# Patient Record
Sex: Female | Born: 1945 | Race: White | Hispanic: No | Marital: Married | Smoking: Never smoker
Health system: Southern US, Community
[De-identification: ages and names within clinical notes are randomized; demographics above are authoritative.]

## PROBLEM LIST (undated history)

## (undated) DIAGNOSIS — E559 Vitamin D deficiency, unspecified: Secondary | ICD-10-CM

## (undated) DIAGNOSIS — K219 Gastro-esophageal reflux disease without esophagitis: Secondary | ICD-10-CM

## (undated) DIAGNOSIS — Z79890 Hormone replacement therapy (postmenopausal): Secondary | ICD-10-CM

## (undated) DIAGNOSIS — K5900 Constipation, unspecified: Secondary | ICD-10-CM

## (undated) DIAGNOSIS — D696 Thrombocytopenia, unspecified: Secondary | ICD-10-CM

## (undated) DIAGNOSIS — D219 Benign neoplasm of connective and other soft tissue, unspecified: Secondary | ICD-10-CM

## (undated) DIAGNOSIS — J45909 Unspecified asthma, uncomplicated: Secondary | ICD-10-CM

## (undated) DIAGNOSIS — M3500 Sicca syndrome, unspecified: Secondary | ICD-10-CM

## (undated) DIAGNOSIS — J849 Interstitial pulmonary disease, unspecified: Secondary | ICD-10-CM

## (undated) DIAGNOSIS — G4730 Sleep apnea, unspecified: Secondary | ICD-10-CM

## (undated) DIAGNOSIS — S32009A Unspecified fracture of unspecified lumbar vertebra, initial encounter for closed fracture: Secondary | ICD-10-CM

## (undated) DIAGNOSIS — G4700 Insomnia, unspecified: Secondary | ICD-10-CM

## (undated) DIAGNOSIS — N951 Menopausal and female climacteric states: Secondary | ICD-10-CM

## (undated) DIAGNOSIS — M069 Rheumatoid arthritis, unspecified: Secondary | ICD-10-CM

## (undated) DIAGNOSIS — L719 Rosacea, unspecified: Secondary | ICD-10-CM

## (undated) DIAGNOSIS — L409 Psoriasis, unspecified: Secondary | ICD-10-CM

## (undated) HISTORY — DX: Sleep apnea, unspecified: G47.30

## (undated) HISTORY — DX: Menopausal and female climacteric states: N95.1

## (undated) HISTORY — DX: Constipation, unspecified: K59.00

## (undated) HISTORY — DX: Psoriasis, unspecified: L40.9

## (undated) HISTORY — DX: Rheumatoid arthritis, unspecified: M06.9

## (undated) HISTORY — DX: Unspecified asthma, uncomplicated: J45.909

## (undated) HISTORY — DX: Unspecified fracture of unspecified lumbar vertebra, initial encounter for closed fracture: S32.009A

## (undated) HISTORY — DX: Hormone replacement therapy: Z79.890

## (undated) HISTORY — DX: Vitamin D deficiency, unspecified: E55.9

## (undated) HISTORY — DX: Gastro-esophageal reflux disease without esophagitis: K21.9

## (undated) HISTORY — DX: Benign neoplasm of connective and other soft tissue, unspecified: D21.9

## (undated) HISTORY — DX: Insomnia, unspecified: G47.00

## (undated) HISTORY — DX: Sjogren syndrome, unspecified: M35.00

## (undated) HISTORY — DX: Rosacea, unspecified: L71.9

## (undated) HISTORY — DX: Thrombocytopenia, unspecified: D69.6

## (undated) HISTORY — DX: Interstitial pulmonary disease, unspecified: J84.9

---

## 1989-01-06 HISTORY — PX: BREAST SURGERY: SHX581

## 1991-07-07 HISTORY — PX: NASAL SINUS SURGERY: SHX719

## 1993-01-06 HISTORY — PX: ABDOMINAL HYSTERECTOMY: SHX81

## 1998-11-12 ENCOUNTER — Encounter: Admission: RE | Admit: 1998-11-12 | Discharge: 1998-11-12 | Payer: Self-pay | Admitting: Obstetrics and Gynecology

## 1998-11-12 ENCOUNTER — Encounter: Payer: Self-pay | Admitting: Obstetrics and Gynecology

## 1999-03-12 ENCOUNTER — Other Ambulatory Visit: Admission: RE | Admit: 1999-03-12 | Discharge: 1999-03-12 | Payer: Self-pay | Admitting: Obstetrics and Gynecology

## 1999-03-18 ENCOUNTER — Encounter: Payer: Self-pay | Admitting: Obstetrics and Gynecology

## 1999-03-18 ENCOUNTER — Encounter: Admission: RE | Admit: 1999-03-18 | Discharge: 1999-03-18 | Payer: Self-pay | Admitting: Obstetrics and Gynecology

## 1999-07-02 ENCOUNTER — Ambulatory Visit (HOSPITAL_COMMUNITY): Admission: RE | Admit: 1999-07-02 | Discharge: 1999-07-02 | Payer: Self-pay | Admitting: Gastroenterology

## 2001-05-21 ENCOUNTER — Encounter: Admission: RE | Admit: 2001-05-21 | Discharge: 2001-05-21 | Payer: Self-pay | Admitting: Obstetrics and Gynecology

## 2001-05-21 ENCOUNTER — Encounter: Payer: Self-pay | Admitting: Obstetrics and Gynecology

## 2002-06-15 ENCOUNTER — Ambulatory Visit (HOSPITAL_COMMUNITY): Admission: RE | Admit: 2002-06-15 | Discharge: 2002-06-15 | Payer: Self-pay | Admitting: Gastroenterology

## 2002-08-07 LAB — HM COLONOSCOPY: HM Colonoscopy: NORMAL

## 2003-04-11 ENCOUNTER — Ambulatory Visit (HOSPITAL_COMMUNITY): Admission: RE | Admit: 2003-04-11 | Discharge: 2003-04-11 | Payer: Self-pay | Admitting: Otolaryngology

## 2003-04-20 ENCOUNTER — Other Ambulatory Visit: Admission: RE | Admit: 2003-04-20 | Discharge: 2003-04-20 | Payer: Self-pay | Admitting: Otolaryngology

## 2004-06-07 ENCOUNTER — Encounter: Admission: RE | Admit: 2004-06-07 | Discharge: 2004-06-07 | Payer: Self-pay | Admitting: Orthopedic Surgery

## 2004-06-24 ENCOUNTER — Encounter: Admission: RE | Admit: 2004-06-24 | Discharge: 2004-06-24 | Payer: Self-pay | Admitting: Orthopedic Surgery

## 2004-08-21 ENCOUNTER — Encounter: Admission: RE | Admit: 2004-08-21 | Discharge: 2004-08-21 | Payer: Self-pay | Admitting: Rheumatology

## 2004-08-29 ENCOUNTER — Encounter: Admission: RE | Admit: 2004-08-29 | Discharge: 2004-08-29 | Payer: Self-pay | Admitting: Rheumatology

## 2004-11-06 DIAGNOSIS — M069 Rheumatoid arthritis, unspecified: Secondary | ICD-10-CM

## 2004-11-06 HISTORY — DX: Rheumatoid arthritis, unspecified: M06.9

## 2005-05-01 ENCOUNTER — Encounter: Payer: Self-pay | Admitting: Family Medicine

## 2006-01-06 DIAGNOSIS — G4730 Sleep apnea, unspecified: Secondary | ICD-10-CM

## 2006-01-06 HISTORY — DX: Sleep apnea, unspecified: G47.30

## 2007-07-13 ENCOUNTER — Encounter: Payer: Self-pay | Admitting: Family Medicine

## 2007-10-07 HISTORY — PX: CYSTOCELE REPAIR: SHX163

## 2007-10-19 ENCOUNTER — Ambulatory Visit (HOSPITAL_BASED_OUTPATIENT_CLINIC_OR_DEPARTMENT_OTHER): Admission: RE | Admit: 2007-10-19 | Discharge: 2007-10-20 | Payer: Self-pay | Admitting: Urology

## 2008-01-11 ENCOUNTER — Encounter: Payer: Self-pay | Admitting: Family Medicine

## 2008-08-06 LAB — HM MAMMOGRAPHY: HM Mammogram: NORMAL

## 2008-08-28 ENCOUNTER — Encounter: Payer: Self-pay | Admitting: Family Medicine

## 2009-01-15 ENCOUNTER — Encounter: Payer: Self-pay | Admitting: Family Medicine

## 2009-02-09 ENCOUNTER — Encounter: Payer: Self-pay | Admitting: Family Medicine

## 2009-03-19 ENCOUNTER — Encounter: Payer: Self-pay | Admitting: Family Medicine

## 2009-05-08 ENCOUNTER — Ambulatory Visit: Payer: Self-pay | Admitting: Family Medicine

## 2009-05-08 DIAGNOSIS — E785 Hyperlipidemia, unspecified: Secondary | ICD-10-CM | POA: Insufficient documentation

## 2009-05-08 DIAGNOSIS — B070 Plantar wart: Secondary | ICD-10-CM | POA: Insufficient documentation

## 2009-05-08 DIAGNOSIS — R011 Cardiac murmur, unspecified: Secondary | ICD-10-CM | POA: Insufficient documentation

## 2009-05-08 DIAGNOSIS — J45909 Unspecified asthma, uncomplicated: Secondary | ICD-10-CM | POA: Insufficient documentation

## 2009-05-08 DIAGNOSIS — N959 Unspecified menopausal and perimenopausal disorder: Secondary | ICD-10-CM | POA: Insufficient documentation

## 2009-05-08 DIAGNOSIS — M810 Age-related osteoporosis without current pathological fracture: Secondary | ICD-10-CM | POA: Insufficient documentation

## 2009-05-08 DIAGNOSIS — M069 Rheumatoid arthritis, unspecified: Secondary | ICD-10-CM | POA: Insufficient documentation

## 2009-05-08 DIAGNOSIS — K219 Gastro-esophageal reflux disease without esophagitis: Secondary | ICD-10-CM | POA: Insufficient documentation

## 2009-05-08 DIAGNOSIS — G4730 Sleep apnea, unspecified: Secondary | ICD-10-CM | POA: Insufficient documentation

## 2009-05-08 DIAGNOSIS — Q762 Congenital spondylolisthesis: Secondary | ICD-10-CM | POA: Insufficient documentation

## 2009-05-08 DIAGNOSIS — H04129 Dry eye syndrome of unspecified lacrimal gland: Secondary | ICD-10-CM | POA: Insufficient documentation

## 2009-05-08 DIAGNOSIS — R4589 Other symptoms and signs involving emotional state: Secondary | ICD-10-CM | POA: Insufficient documentation

## 2009-06-21 ENCOUNTER — Encounter: Payer: Self-pay | Admitting: Family Medicine

## 2009-10-05 ENCOUNTER — Telehealth: Payer: Self-pay | Admitting: Family Medicine

## 2009-10-25 ENCOUNTER — Encounter: Payer: Self-pay | Admitting: Family Medicine

## 2010-01-16 ENCOUNTER — Encounter: Payer: Self-pay | Admitting: Family Medicine

## 2010-02-07 NOTE — Procedures (Signed)
[facility]: [facility]   Imported By: Lanelle Bal 05/14/2009 10:25:27  _____________________________________________________________________  External Attachment:    Type:   Image     Comment:   External Document

## 2010-02-07 NOTE — Letter (Signed)
Summary: [facility] at [facility] at [facility]   Imported By: Maryln Gottron 05/10/2009 11:15:28  _____________________________________________________________________  External Attachment:    Type:   Image     Comment:   External Document

## 2010-02-07 NOTE — Progress Notes (Signed)
Summary: refill request for flonase  Phone Note Refill Request Message from:  Fax from Pharmacy  Refills Requested: Medication #1:  fluticasone 50 mcg Faxed request from [facility], this is not on med list.  Initial call taken by: Lowella Petties CMA,  October 05, 2009 12:18 PM  Follow-up for Phone Call        px written on EMR for call in  verify with pt as I did not see it on her med list --make sure it is the nasal spray- thanks Follow-up by: Judith Part MD,  October 05, 2009 1:30 PM  Additional Follow-up for Phone Call Additional follow up Details #1::        Rx faxed to pharmacy, called pt to verify that medication was correct, pt wasn't home. Called pharmacy and it was correct, per Dr.Tower ok to send to pharmacy. Additional Follow-up by: Mervin Hack CMA Duncan Dull),  October 05, 2009 1:38 PM    New/Updated Medications: FLONASE 50 MCG/ACT SUSP (FLUTICASONE PROPIONATE) 2 sprays in each nostril once daily Prescriptions: FLONASE 50 MCG/ACT SUSP (FLUTICASONE PROPIONATE) 2 sprays in each nostril once daily  #1 x 11   Entered by:   Mervin Hack CMA (AAMA)   Authorized by:   Judith Part MD   Signed by:   Mervin Hack CMA Duncan Dull) on 10/05/2009   Method used:   Electronically to        [facility]* (retail)       [address] [place], [place]  [address]       Ph: [phone] or [phone]       Fax: [phone]   RxID:   [id number]

## 2010-02-07 NOTE — Miscellaneous (Signed)
Summary: flu vaccine  Clinical Lists Changes  Observations: Added new observation of FLU VAX: Historical at [facility] (10/10/2009 16:15)      Immunization History:  Influenza Immunization History:    Influenza:  historical at [facility] (10/10/2009)

## 2010-02-07 NOTE — Letter (Signed)
Summary: [facility] Norris [facility] at [facility] Internal Medicine at [facility]   Imported By: Maryln Gottron 05/10/2009 11:18:36  _____________________________________________________________________  External Attachment:    Type:   Image     Comment:   External Document
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
## 2010-02-07 NOTE — Letter (Signed)
Summary: [facility] at [facility] Internal Medicine at [facility]   Imported By: Maryln Gottron 05/10/2009 11:16:57  _____________________________________________________________________  External Attachment:    Type:   Image     Comment:   External Document

## 2010-02-07 NOTE — Letter (Signed)
Summary: [facility] at [facility] Internal Medicine at [facility]   Imported By: Maryln Gottron 05/10/2009 11:20:03  _____________________________________________________________________  External Attachment:    Type:   Image     Comment:   External Document

## 2010-02-07 NOTE — Letter (Signed)
Summary: Patient Questionnaire  Patient Questionnaire   Imported By: Beau Fanny 05/09/2009 15:36:51  _____________________________________________________________________  External Attachment:    Type:   Image     Comment:   External Document

## 2010-02-21 NOTE — Letter (Signed)
Summary: [facility] note  [facility] note   Imported By: Kassie Mends 02/11/2010 10:05:33  _____________________________________________________________________  External Attachment:    Type:   Image     Comment:   External Document

## 2010-03-26 ENCOUNTER — Telehealth: Payer: Self-pay | Admitting: *Deleted

## 2010-03-26 NOTE — Telephone Encounter (Signed)
Pt states she is due for pneumovax, rheumatologist has suggested that she get these every few years.  OK to schedule?

## 2010-03-26 NOTE — Telephone Encounter (Signed)
These are generally given every 5-7 years below the age of 18 -- and I do not see one documented so please sched for the immunization (does not need to see me)

## 2010-03-26 NOTE — Telephone Encounter (Signed)
Pt notified as instructed by telephone. Pt scheduled nurse visit on 04/16/10 at 2:30pm.

## 2010-04-16 ENCOUNTER — Ambulatory Visit: Payer: Self-pay

## 2010-04-18 ENCOUNTER — Ambulatory Visit: Payer: Self-pay

## 2010-04-18 ENCOUNTER — Ambulatory Visit

## 2010-05-03 ENCOUNTER — Telehealth: Payer: Self-pay | Admitting: *Deleted

## 2010-05-03 NOTE — Telephone Encounter (Signed)
I wrote it on px pad in IN box

## 2010-05-03 NOTE — Telephone Encounter (Signed)
[facility] so that she can get her CPAP supplies . She said that her old one is expired and she has replace the tubing, chamber,  etc.

## 2010-05-03 NOTE — Telephone Encounter (Signed)
Patient notified as instructed by telephone. Order faxed to home health [phone] as instructed.

## 2010-05-21 NOTE — Op Note (Signed)
NAME:  Deanna Norris, Deanna Norris            ACCOUNT NO.:  [id number]   MEDICAL RECORD NO.:  [id number]          PATIENT TYPE:  AMB   LOCATION:  [facility]                         FACILITY:  [facility]   PHYSICIAN:  Jamison Neighbor, M.D.  DATE OF BIRTH:  1945-11-14   DATE OF PROCEDURE:  10/19/2007  DATE OF DISCHARGE:                               OPERATIVE REPORT   PREOPERATIVE DIAGNOSES:  1. Stress urinary incontinence.  2. Cystocele.  3. Anterior vault prolapse.   POSTOPERATIVE DIAGNOSES:  1. Stress urinary incontinence.  2. Cystocele.  3. Anterior vault prolapse.   PROCEDURE:  1. Cystourethroscopy.  2. Anterior colporrhaphy with Pinnacle mesh.   ATTENDING PHYSICIAN:  Jamison Neighbor, M.D.   RESIDENT PHYSICIAN:  Dr. Delman Kitten.   ANESTHESIA:  Was general plus local.   INDICATIONS FOR PROCEDURE:  Deanna Norris is a 65 year old white female  with a history of pelvic organ prolapse.  She has had a rectocele repair  in the past and possibly an anterior repair done at that time as well.  She has done quite well until recently she felt a protrusion of her  vaginal wall in the introitus.  She was seen by gyn who diagnosed her  with a cystocele.  She subsequently has followed up with Dr. Logan Bores  complaining of this sensation but also complaining of some urinary  incontinence type symptoms.  She did undergo urodynamic studies  preoperatively which demonstrated a Valsalva leak point pressure of 131  with minimal leak.  She has a good flow with a postvoid residual of 10  mL.  Based on these findings she was recommended to have the above-  stated procedure.  Informed consent was obtained preoperatively after  risks, benefits, consequences and concerns were discussed.   PROCEDURE IN DETAIL:  The patient was brought to the operating room,  placed in a supine position.  She was correctly identified by her  wristband and appropriate time-out was taken.  IV antibiotics were  administered and general  anesthesia was delivered.  Once adequately  anesthetized she was placed in the dorsal lithotomy position.  Great  care taken to minimize the risk of peripheral neuropathy or compartment  syndrome.  Her perineum was prepped and draped sterilely.  We began our  procedure initially by placing a 16 French Foley catheter into her  bladder, inflating the balloon and draining her bladder appropriately.  We then infiltrated the anterior vaginal wall/mucosa with 0.25% Marcaine  with epinephrine.  We then made a vertical incision in the anterior  vaginal wall.  We sharply dissected out the cystocele and carried our  dissection through the endopelvic fascia and exposed the sacrospinous  ligament.  We did this bilaterally.  There was no significant bleeding.  We did check and we did not buttonhole the vaginal mucosa.  With the  ligament and the arcus tendinous palpable on both sides we then used our  Capio device to pass the arms of our Pinnacle mesh.  After the mesh was  seated appropriately with the blue line in the patient's midline we then  trimmed it appropriately.  We then tacked  the anterior aspect of the  mesh down with three simple interrupted sutures using 2-0 Vicryl.  Under  close inspection this did reduce her cystocele significantly and support  her anterior vaginal wall.  We copiously irrigated her wound.  We  removed the urethral catheter and performed rigid cystourethroscopy with  a 22 French rigid cystoscopic sheath, 12 degrees lens and sterile water  as our irrigant.  The urethra was normal in course and caliber and  showed good coaptation at the level of the sphincter.  The bladder did  not show any evidence of violation.  Both ureteral orifices were noted  to be in their normal anatomic position and refluxing clear urine.  There were no masses in her bladder.  We subsequently removed the  cystoscope and replaced the Foley catheter.  We then once again  copiously irrigated her  introitus.  We then closed her vaginal mucosa  with a running suture using 2-0 Vicryl.  This was hemostatic.  We then  placed a vaginal pack with Estrace cream in her vagina and this marked  the end of our procedure.  She awoke from anesthesia and was taken to  the recovery room in stable condition.  There were no complications.  She tolerated the procedure well.  Dr. Logan Bores was present and  participated in all aspects of the case.     ______________________________  Dr Gery Pray, M.D.  Electronically Signed    DW/MEDQ  D:  10/19/2007  T:  10/19/2007  Job:  [id number]

## 2010-05-24 ENCOUNTER — Ambulatory Visit (INDEPENDENT_AMBULATORY_CARE_PROVIDER_SITE_OTHER): Admitting: Family Medicine

## 2010-05-24 ENCOUNTER — Ambulatory Visit: Admitting: Family Medicine

## 2010-05-24 ENCOUNTER — Encounter: Payer: Self-pay | Admitting: Family Medicine

## 2010-05-24 VITALS — BP 104/60 | HR 68 | Temp 98.0°F | Ht 66.0 in | Wt 152.5 lb

## 2010-05-24 DIAGNOSIS — J029 Acute pharyngitis, unspecified: Secondary | ICD-10-CM

## 2010-05-24 DIAGNOSIS — J020 Streptococcal pharyngitis: Secondary | ICD-10-CM

## 2010-05-24 LAB — POCT RAPID STREP A (OFFICE): Rapid Strep A Screen: POSITIVE — AB

## 2010-05-24 MED ORDER — AMOXICILLIN 500 MG PO CAPS: 500.0000 mg | ORAL_CAPSULE | Freq: Three times a day (TID) | ORAL | Status: AC

## 2010-05-24 NOTE — Assessment & Plan Note (Signed)
tx with amox 500 tid  Hold methotrexate on this  Fluids/ sympt care disc Call if not imp in 2-3 d or if worse

## 2010-05-24 NOTE — Op Note (Signed)
   NAME:  Deanna Norris, Deanna Norris                      ACCOUNT NO.:  [id number]   MEDICAL RECORD NO.:  [id number]                   PATIENT TYPE:  AMB   LOCATION:  ENDO                                 FACILITY:  [facility]   PHYSICIAN:  Anselmo Rod, M.D.               DATE OF BIRTH:  [birth year]   DATE OF PROCEDURE:  06/15/2002  DATE OF DISCHARGE:                                 OPERATIVE REPORT   PROCEDURE:  Colonoscopy.   ENDOSCOPIST:  Anselmo Rod, M.D.   INSTRUMENT USED:  Olympus video colonoscope.   INDICATIONS FOR PROCEDURE:  The patient is a 65 year old white female.  Rule  out colonic polyps, masses, hemorrhoids, etc.   PRE-PROCEDURE PREPARATION:  An informed consent was procured from the  patient.  The patient was fasted for eight hours prior to the procedure and  prepped with a bottle of magnesium citrate and a gallon of GoLYTELY on the  night prior to the procedure.   PRE-PROCEDURE PHYSICAL EXAM:  VITAL SIGNS:  Stable.  NECK:  Supple.  CHEST:  Clear to auscultation.  HEART:  S1, S2 regular.  ABDOMEN:  Soft, with normal bowel sounds.   DESCRIPTION OF PROCEDURE:  The patient was placed in the left lateral  decubitus position and sedated with 50 mg of Demerol and 7.5 mg of Versed  intravenously.  Once the patient was adequately sedated, and maintained on  low-flow oxygen and continuous cardiac monitoring, the Olympus video  colonoscope was advanced from the rectum to the cecum without difficulty.  Small internal hemorrhoids were seen on retroflexion in the rectum.  The  entire colonic mucosa appeared healthy with a normal vascular pattern.  No  masses, polyps, erosions, strictures, or diverticula were seen.  The  appendicular orifice and ileocecal valve were clearly visualized and  photographed.  The terminal ileum appeared normal as well.   IMPRESSION:  Small non-bleeding internal hemorrhoids, otherwise normal  colonoscopy to the terminal ileum.    RECOMMENDATIONS:  1. A high-fiber diet with regular food intake has been advocated.  2. Repeat colorectal cancer screening has been recommended in the next 10     years, unless the patient develops abnormal symptoms in the interim.                                               Anselmo Rod, M.D.    JNM/MEDQ  D:  06/15/2002  T:  06/15/2002  Job:  [id number]   cc:   Theressa Millard, M.D.  [address] [place]  [place] [address]  Fax: [phone]

## 2010-05-24 NOTE — Patient Instructions (Signed)
Drink lots of fluids  Stop methotrexate for 10 days Take amoxicillin for strep throat Tylenol ok  If not starting to improve by Monday let me know  I sent px to your pharmacy

## 2010-05-24 NOTE — Progress Notes (Signed)
  Subjective:    Patient ID: Deanna Norris, female    DOB: 1945/02/12, 65 y.o.   MRN: [id number]  HPI  Here for acute visit for sore throat  Rapid strep pos today  Started getting sick Tuesday - st Wed 101 fever with aches all over Was around grandson all day yesterday - is worried about that  Used hand sanitizer Glands feel swollen No headache  Has been taking tylenol atc   Has RA and is on immunosup meds   Past Medical History  Diagnosis Date  . Osteoporosis     History   Social History  . Marital Status: Married    Spouse Name: N/A    Number of Children: N/A  . Years of Education: N/A   Occupational History  . Not on file.   Social History Main Topics  . Smoking status: Never Smoker   . Smokeless tobacco: Not on file  . Alcohol Use: Not on file  . Drug Use: Not on file  . Sexually Active: Not Currently   Other Topics Concern  . Not on file   Social History Narrative  . No narrative on file     Review of Systems Review of Systems  Constitutional: Negative for fever, appetite change, and unexpected weight change. pos for fatigue  Eyes: Negative for pain and visual disturbance.  ENT pos for st, neg for congestion/ ear pain or nasal symptoms Respiratory: Negative for cough and shortness of breath.   Cardiovascular: Negative.   Gastrointestinal: Negative for nausea, diarrhea and constipation.  Genitourinary: Negative for urgency and frequency.  Skin: Negative for pallor. no rash MSK no joint pain Neurological: Negative for weakness, light-headedness, numbness and headaches.  Hematological: Negative for adenopathy. Does not bruise/bleed easily.  Psychiatric/Behavioral: Negative for dysphoric mood. The patient is not nervous/anxious.          Objective:   Physical Exam  Constitutional: She appears well-developed and well-nourished. No distress.  HENT:  Head: Normocephalic and atraumatic.  Right Ear: External ear normal.  Left Ear: External ear  normal.  Nose: Nose normal.       Throat diffusely erythematous  No exudate No swelling  No lesions   Neck: Normal range of motion. Neck supple. No JVD present. No thyromegaly present.       Few ant cervical LN - mobile and nt  Cardiovascular: Normal rate, regular rhythm and normal heart sounds.   Pulmonary/Chest: Effort normal and breath sounds normal. She has no rales.  Abdominal: Soft. Bowel sounds are normal.  Musculoskeletal: She exhibits no edema and no tenderness.       No acute joint changes   Skin: Skin is warm and dry. No rash noted.  Psychiatric: She has a normal mood and affect.          Assessment & Plan:

## 2010-05-24 NOTE — Procedures (Signed)
[facility]  Patient:    Deanna Norris, Deanna Norris                   MRN: [id number] Proc. Date: 07/02/99 Adm. Date:  [id number] Attending:  Charna Elizabeth Dictator:   Anselmo Rod, M.D. CC:         Laqueta Linden, M.D.                           Procedure Report  PROCEDURE:  Colonoscopy.  DATE OF BIRTH:  04/23/2045  REFERRING PHYSICIAN:  Laqueta Linden, M.D.  ENDOSCOPIST:  Dr. Anselmo Rod.  INSTRUMENT USED:  Olympus video colonoscope.  INDICATIONS FOR PROCEDURE:  Blood in stool in a 65 year old white female, rule out chronic masses, polyps, hemorrhoids, etc.  PREPROCEDURE PREPARATION:  Informed consent was obtained from the patient. The patient was fasted for eight hours prior to the procedure, and prepped with a bottle of magnesium citrate and a gallon of NuLYTELY the night prior to the procedure.  PHYSICAL EXAMINATION:  VITAL SIGNS:  Stable.  NECK:  Supple.  CHEST:  Clear to auscultation, S1 and S2 regular.  ABDOMEN:  Soft, with normal abdominal bowel sounds.  DESCRIPTION OF PROCEDURE:  The patient was placed in the left lateral decubitus position and sedated with 25 mg of Demerol and 5 mg of Versed intravenously.  Once the patient was adequately sedated, maintained on low flow oxygen, and continuous cardiac monitoring, an Olympus video colonoscope was advanced from the rectum to the cecum without difficulty.  Except for a few scattered diverticula throughout the colon and small internal hemorrhoids, no other abnormalities were seen.  The patient tolerated the procedure well without complication.  IMPRESSION: 1. A few scattered diverticula throughout the colon. 2. Small non-bleeding internal hemorrhoids. 3. No masses or polyps seen.  RECOMMENDATIONS: 1. The patient is advised to increase her fluid and fiber in the diet. 2. Outpatient follow up is advised on a p.r.n. basis as needed. 3. Repeat colonoscopy in the next 5-10 years for  colorectal cancer screening. DD:  07/02/99 TD:  07/03/99 Job: [id number]

## 2010-06-12 ENCOUNTER — Ambulatory Visit (INDEPENDENT_AMBULATORY_CARE_PROVIDER_SITE_OTHER): Admitting: Family Medicine

## 2010-06-12 DIAGNOSIS — Z23 Encounter for immunization: Secondary | ICD-10-CM

## 2010-06-12 DIAGNOSIS — J029 Acute pharyngitis, unspecified: Secondary | ICD-10-CM

## 2010-06-12 NOTE — Progress Notes (Signed)
Pneumonia vaccine given during nurse visit today.

## 2010-06-13 NOTE — Progress Notes (Signed)
  Subjective:    Patient ID: Deanna Norris, female    DOB: 07-03-1945, 65 y.o.   MRN: [id number]  HPI ? Here for sore thoat-- ? No show   Review of Systems     Objective:   Physical Exam        Assessment & Plan:

## 2010-08-21 ENCOUNTER — Ambulatory Visit: Admitting: Family Medicine

## 2010-08-24 ENCOUNTER — Ambulatory Visit (INDEPENDENT_AMBULATORY_CARE_PROVIDER_SITE_OTHER): Admitting: Family Medicine

## 2010-08-24 VITALS — BP 110/80 | HR 60 | Temp 98.1°F | Wt 150.0 lb

## 2010-08-24 DIAGNOSIS — J209 Acute bronchitis, unspecified: Secondary | ICD-10-CM

## 2010-08-24 MED ORDER — AZITHROMYCIN 250 MG PO TABS: ORAL_TABLET | ORAL | Status: AC

## 2010-08-24 NOTE — Patient Instructions (Signed)
Start guafenesin. Avoid decongestants. Nasal saline irrigation 3-4 times a day. Start antibiotics.  Call if not improving as expected.

## 2010-08-24 NOTE — Assessment & Plan Note (Signed)
Given immunosuppression and worsening symptoms past 2 weeks...will treat with antibiotics. Otherwise symptomatic care.

## 2010-08-24 NOTE — Progress Notes (Signed)
  Subjective:    Patient ID: Deanna Norris, female    DOB: August 18, 1945, 65 y.o.   MRN: [id number]  Cough This is a new problem. The current episode started 1 to 4 weeks ago (2 weeks ago). The problem has been gradually worsening. The problem occurs every few minutes. The cough is non-productive. Associated symptoms include ear pain, nasal congestion, a sore throat and wheezing. Pertinent negatives include no chest pain, chills, headaches, postnasal drip or shortness of breath. The symptoms are aggravated by nothing. Treatments tried: decongestant, nasal irrigation. The treatment provided mild relief. Her past medical history is significant for bronchitis.  Otalgia  There is pain in both (fullness and popping, not really pain) ears. This is a new problem. The current episode started in the past 7 days. The problem occurs every few hours. The problem has been gradually worsening. There has been no fever. Associated symptoms include coughing and a sore throat. Pertinent negatives include no headaches.      Review of Systems  Constitutional: Negative for chills.  HENT: Positive for ear pain and sore throat. Negative for postnasal drip.   Respiratory: Positive for cough and wheezing. Negative for shortness of breath.   Cardiovascular: Negative for chest pain.  Neurological: Negative for headaches.   HAs history of RA and has stopped enbrel and mtx.  HAs history of getting asthma symptoms after infections.    Objective:   Physical Exam  Constitutional: Vital signs are normal. She appears well-developed and well-nourished. She is cooperative.  Non-toxic appearance. She does not appear ill. No distress.  HENT:  Head: Normocephalic.  Right Ear: Hearing, tympanic membrane, external ear and ear canal normal. Tympanic membrane is not erythematous, not retracted and not bulging.  Left Ear: Hearing, tympanic membrane, external ear and ear canal normal. Tympanic membrane is not erythematous, not  retracted and not bulging.  Nose: Mucosal edema and rhinorrhea present. Right sinus exhibits no maxillary sinus tenderness and no frontal sinus tenderness. Left sinus exhibits no maxillary sinus tenderness and no frontal sinus tenderness.  Mouth/Throat: Uvula is midline, oropharynx is clear and moist and mucous membranes are normal.  Eyes: Conjunctivae, EOM and lids are normal. Pupils are equal, round, and reactive to light. No foreign bodies found.  Neck: Trachea normal and normal range of motion. Neck supple. Carotid bruit is not present. No mass and no thyromegaly present.  Cardiovascular: Normal rate, regular rhythm, S1 normal, S2 normal, normal heart sounds, intact distal pulses and normal pulses.  Exam reveals no gallop and no friction rub.   No murmur heard. Pulmonary/Chest: Effort normal and breath sounds normal. Not tachypneic. No respiratory distress. She has no decreased breath sounds. She has no wheezes. She has no rhonchi. She has no rales.  Genitourinary: Vagina normal and uterus normal.  Neurological: She is alert.  Skin: Skin is warm, dry and intact. No rash noted.  Psychiatric: Her speech is normal and behavior is normal. Judgment normal. Her mood appears not anxious. Cognition and memory are normal. She does not exhibit a depressed mood.          Assessment & Plan:

## 2010-10-02 ENCOUNTER — Encounter: Payer: Self-pay | Admitting: Family Medicine

## 2010-10-03 ENCOUNTER — Encounter: Payer: Self-pay | Admitting: *Deleted

## 2010-10-08 LAB — POCT HEMOGLOBIN-HEMACUE: Hemoglobin: 15.5 — ABNORMAL HIGH

## 2010-11-04 ENCOUNTER — Encounter: Payer: Self-pay | Admitting: Family Medicine

## 2011-01-24 DIAGNOSIS — Z79899 Other long term (current) drug therapy: Secondary | ICD-10-CM | POA: Diagnosis not present

## 2011-02-12 ENCOUNTER — Ambulatory Visit (INDEPENDENT_AMBULATORY_CARE_PROVIDER_SITE_OTHER): Payer: Medicare Other | Admitting: Family Medicine

## 2011-02-12 ENCOUNTER — Encounter: Payer: Self-pay | Admitting: Family Medicine

## 2011-02-12 VITALS — BP 118/72 | HR 64 | Temp 97.6°F | Ht 66.0 in | Wt 158.5 lb

## 2011-02-12 DIAGNOSIS — J0100 Acute maxillary sinusitis, unspecified: Secondary | ICD-10-CM | POA: Diagnosis not present

## 2011-02-12 DIAGNOSIS — J019 Acute sinusitis, unspecified: Secondary | ICD-10-CM | POA: Insufficient documentation

## 2011-02-12 MED ORDER — AMOXICILLIN-POT CLAVULANATE 875-125 MG PO TABS: 1.0000 | ORAL_TABLET | Freq: Two times a day (BID) | ORAL | Status: AC

## 2011-02-12 NOTE — Assessment & Plan Note (Signed)
Worse on the L  tx with augmentin Will hold immunosuppressive drugs until better  Disc symptomatic care - see instructions on AVS  Update if not starting to improve in a week or if worsening  e

## 2011-02-12 NOTE — Progress Notes (Signed)
Subjective:    Patient ID: Deanna Norris, female    DOB: Jun 27, 1945, 66 y.o.   MRN: [id number]  HPI Over a week ago - 10 days ago -- got a cold  Now getting worse L ear is throbbing -- a little better after mucinex and steam  Sinuses are hurting below eyes and into roof of mouth Blowing out yellow/ green junk Fever was last night   Today not feeling great  Not a lot of cough- mild   Is on enbrel/ mtx -- skipped most recent doses   Patient Active Problem List  Diagnoses  . PLANTAR WART  . HYPERLIPIDEMIA  . STRESS REACTION, ACUTE, WITH EMOTIONAL DISTURBANCE  . DRY EYE SYNDROME  . ASTHMA  . GERD  . POSTMENOPAUSAL STATUS  . MENOPAUSAL DISORDER  . ARTHRITIS, RHEUMATOID  . OSTEOPOROSIS  . SPONDYLOLISTHESIS  . SLEEP APNEA  . HEART MURMUR  . Strep pharyngitis  . Acute bronchitis  . Maxillary sinusitis, acute   Past Medical History  Diagnosis Date  . Osteoporosis    Past Surgical History  Procedure Date  . Breast surgery 1991    breast biopsy  . Abdominal hysterectomy 1995    partial   History  Substance Use Topics  . Smoking status: Never Smoker   . Smokeless tobacco: Not on file  . Alcohol Use: Not on file   No family history on file. Allergies  Allergen Reactions  . Adalimumab     REACTION: frequent sinus infection  . Sulfamethoxazole W/Trimethoprim     REACTION: face and hands rash and felt burning sensation  . Tramadol Hcl     REACTION: nausea   Current Outpatient Prescriptions on File Prior to Visit  Medication Sig Dispense Refill  . aspirin 81 MG tablet Take 81 mg by mouth daily.        . Calcium Citrate-Vitamin D (CITRACAL/VITAMIN D PO) Take 1 tablet by mouth daily.        . Cholecalciferol (VITAMIN D3) 2000 UNITS TABS Take 1 tablet by mouth daily.        . Estradiol (VAGIFEM) 10 MCG TABS Insert 1 tab vaginally twice per week as directed.       Marland Kitchen estradiol (VIVELLE-DOT) 0.025 MG/24HR Place 1 patch onto the skin 2 (two) times a week.        .  etanercept (ENBREL) 50 MG/ML injection Inject 50 mg into the skin once a week.        . Folic Acid-Vit B6-Vit B12 (FOLGARD RX) 2.2-25-1 MG TABS Take 1 tablet by mouth daily.        . methotrexate 25 MG/ML SOLN Inject 1 ml under the skin every week       . Multiple Vitamin (MULTIVITAMIN) capsule Take 1 capsule by mouth daily.        . pilocarpine (SALAGEN) 5 MG tablet Take 5 mg by mouth 3 (three) times daily.       Marland Kitchen zolpidem (AMBIEN) 5 MG tablet Take 5 mg by mouth at bedtime as needed.        Marland Kitchen albuterol (PROAIR HFA) 108 (90 BASE) MCG/ACT inhaler Inhale 2 puffs into the lungs every 4 (four) hours as needed.        Marland Kitchen dexlansoprazole (DEXILANT) 60 MG capsule Take 60 mg by mouth daily.        . fluticasone (FLONASE) 50 MCG/ACT nasal spray 2 sprays by Nasal route daily.        Marland Kitchen POLYVIN ALC-POVIDON DIMETHYLAM (FRESHKOTE)  2.7-2 % SOLN 1 drop into both eyes three times a day            Review of Systems Review of Systems  Constitutional: Negative for , appetite change, and unexpected weight change.  Eyes: Negative for pain and visual disturbance.  ENT pos for sinus pain/ congestion and st  Respiratory: Negative for cough and shortness of breath.   Cardiovascular: Negative for cp or palpitations    Gastrointestinal: Negative for nausea, diarrhea and constipation.  Genitourinary: Negative for urgency and frequency.  Skin: Negative for pallor or rash   Neurological: Negative for weakness, light-headedness, numbness and headaches.  Hematological: Negative for adenopathy. Does not bruise/bleed easily.  Psychiatric/Behavioral: Negative for dysphoric mood. The patient is not nervous/anxious.          Objective:   Physical Exam  Constitutional: She appears well-developed and well-nourished. No distress.  HENT:  Head: Normocephalic and atraumatic.  Right Ear: External ear normal.  Mouth/Throat: Oropharynx is clear and moist. No oropharyngeal exudate.       Nares are injected and congested    bilat marked maxillary sinus tenderness Throat- post nasal drip  Eyes: Conjunctivae and EOM are normal. Pupils are equal, round, and reactive to light. Right eye exhibits no discharge. Left eye exhibits no discharge.  Neck: Normal range of motion. Neck supple. No JVD present.  Cardiovascular: Normal rate and regular rhythm.   Pulmonary/Chest: Effort normal and breath sounds normal. No respiratory distress. She has no wheezes.  Lymphadenopathy:    She has no cervical adenopathy.  Neurological: She is alert. She has normal reflexes. No cranial nerve deficit.  Skin: Skin is warm and dry. No rash noted. No erythema. No pallor.  Psychiatric: She has a normal mood and affect.          Assessment & Plan:

## 2011-02-12 NOTE — Patient Instructions (Signed)
Drink lots of fluids Keep rinsing sinuses /saline nasal spray Breathe steam  Take augmentin as directed Update if not starting to improve in a week or if worsening

## 2011-02-24 DIAGNOSIS — Z111 Encounter for screening for respiratory tuberculosis: Secondary | ICD-10-CM | POA: Diagnosis not present

## 2011-02-24 DIAGNOSIS — M069 Rheumatoid arthritis, unspecified: Secondary | ICD-10-CM | POA: Diagnosis not present

## 2011-02-24 DIAGNOSIS — M19049 Primary osteoarthritis, unspecified hand: Secondary | ICD-10-CM | POA: Diagnosis not present

## 2011-02-24 DIAGNOSIS — G4700 Insomnia, unspecified: Secondary | ICD-10-CM | POA: Diagnosis not present

## 2011-02-28 ENCOUNTER — Telehealth: Payer: Self-pay | Admitting: Family Medicine

## 2011-02-28 NOTE — Telephone Encounter (Signed)
Triage Record Num: [id number] Operator: Chevis Pretty Patient Name: Deanna Norris Call Date & Time: 02/28/2011 2:29:13PM Patient Phone: [phone] PCP: Audrie Gallus. [facility] Patient Gender: Female PCP Fax : Patient DOB: 09/19/1945 Practice Name: [facility] Reason for Call: Caller: Nancee/Patient; PCP: Roxy Manns A.; CB#: [phone]; ; ; Call regarding Heart Palpitations for Years, But Over Past 2. Months, Have Worsened; states also has a a spot on chest which she would like to "have checked out to make sure it's not precancerous or anything." Afebrile. States did discuss this with her rheumatolgist, but she told her to follow up with Dr. Milinda Antis. Denies dizziness, chest pain, or resp distress; able to tolerate her usual activity. Skin lesion is bean-sized, round, nontender. Has been present x 2 weeks. It is under the skin, mildly red. Takes immunosuppresive medications. Per protocols, emergent symtpoms denied; advised appt within 72 hours. Declines appt 03/03/11; appt sched 03/04/11 1530 with Dr. Milinda Antis. Home care advised for interim, iwht callback parameters given. Protocol(s) Used: Irregular Heartbeat Recommended Outcome per Protocol: See Provider within 72 Hours Reason for Outcome: Menopausal (with or without prescription for hormone replacement therapy) AND new onset of intermittent episodes of irregular pulse or pulse rate more than 90 beats/minute at rest Care Advice: ~ DO NOT drive until condition evaluated. Call EMS 911 if having any of the following symptoms: chest, neck, jaw, arm, shoulder, or upper back pain; or shortness of breath at rest. ~ Avoid caffeine (coffee, tea, cola drinks, or chocolate), alcohol, and nicotine (use of tobacco), as use of these substances may worsen symptoms. ~ Change position slowly. Sit for a couple of minutes before standing to walk. Quick position changes may cause or worsen symptoms. ~ Call provider immediately if you are  having new palpitations or irregular heartbeat and have risk factors for heart disease including high cholesterol, diabetes, or high blood pressure. ~ ~ SYMPTOM / CONDITION MANAGEMENT ~ CAUTIONS 02/28/2011 2:50:21PM Page 1 of 1 [id number] [facility] Triage Call Report Triage Record Num: [id number] Operator: Chevis Pretty Patient Name: Deanna Norris Call Date & Time: 02/28/2011 2:29:13PM Patient Phone: [phone] PCP: Audrie Gallus. [facility] Patient Gender: Female PCP Fax : Patient DOB: [birth year] Practice Name: [facility] Reason for Call: Caller: Madissen/Patient; PCP: Roxy Manns A.; CB#: [phone]; ; ; Call regarding Heart Palpitations for Years, But Over Past 2. Months, Have Worsened; states also has a a spot on chest which she would like to "have checked out to make sure it's not precancerous or anything." Afebrile. States did discuss this with her rheumatolgist, but she told her to follow up with Dr. Milinda Antis. Denies dizziness, chest pain, or resp distress; able to tolerate her usual activity. Skin lesion is bean-sized, round, nontender. Has been present x 2 weeks. It is under the skin, mildly red. Takes immunosuppresive medications. Per protocols, emergent symtpoms denied; advised appt within 72 hours. Declines appt 03/03/11; appt sched 03/04/11 1530 with Dr. Milinda Antis. Home care advised for interim, iwht callback parameters given. Protocol(s) Used: Skin Lesions Recommended Outcome per Protocol: See Provider within 72 Hours Reason for Outcome: New or existing mole that has an irregular shape and border, color variability; suddenly increasing in size, or becomes ulcerated Care Advice: ~ Do not pick or squeeze blemishes as it may cause or worsen an infection or scarring. New lesion(s) that increases in size, has an irregular shape, does not heal, becomes ulcerated or bleeds, should be evaluated by a provider. ~ ~ SYMPTOM / CONDITION MANAGEMENT ~ CAUTIONS  See a  provider within 4 hours if signs of local infection occur (such as redness, warmth, tenderness, swelling; may have red streaks or purulent drainage) ~ 02/28/2011 2:50:23PM Page 1 of 1

## 2011-02-28 NOTE — Telephone Encounter (Signed)
Will see her at her appt on 2/26

## 2011-03-03 ENCOUNTER — Ambulatory Visit: Payer: Medicare Other | Admitting: Family Medicine

## 2011-03-04 ENCOUNTER — Ambulatory Visit (INDEPENDENT_AMBULATORY_CARE_PROVIDER_SITE_OTHER): Payer: Medicare Other | Admitting: Family Medicine

## 2011-03-04 ENCOUNTER — Encounter: Payer: Self-pay | Admitting: Family Medicine

## 2011-03-04 VITALS — BP 100/64 | HR 60 | Temp 98.2°F | Ht 66.0 in | Wt 158.5 lb

## 2011-03-04 DIAGNOSIS — R002 Palpitations: Secondary | ICD-10-CM | POA: Diagnosis not present

## 2011-03-04 DIAGNOSIS — L989 Disorder of the skin and subcutaneous tissue, unspecified: Secondary | ICD-10-CM | POA: Diagnosis not present

## 2011-03-04 NOTE — Progress Notes (Signed)
Subjective:    Patient ID: Deanna Norris, female    DOB: [birth year], 66 y.o.   MRN: [id number]  HPI Here with palpitations and also skin lesion  Having palpitations for years and years  Had holter monitor 2007 Symptoms have become worse  Triggers include hot shower/anxiety/ stairs or very intense exercise  occ causes her to cough  Often relaxing and stopping what she is doing helps  Usually last about 20 seconds  No cp or sob or sweating In general does not affect her lifestyle   Has been very stressed lately - for years with no change- has adapted  Drinks one cup of coffee a day and can no longer drink 2   She did tell Dr Jon Billings about this - not concerned   No hx of a heart M or rheumatic fever   EKg today shows NSR with rate of 72, one PAC, and no acute changes   Have report from old holter monitor in 5/07 Dr Mayer Camel NSR with avg HR of 71 Rare pac and PVC That corresp to her palpitations   Has a skin lesion to look at on chest   Patient Active Problem List  Diagnoses  . PLANTAR WART  . HYPERLIPIDEMIA  . STRESS REACTION, ACUTE, WITH EMOTIONAL DISTURBANCE  . DRY EYE SYNDROME  . ASTHMA  . GERD  . POSTMENOPAUSAL STATUS  . MENOPAUSAL DISORDER  . ARTHRITIS, RHEUMATOID  . OSTEOPOROSIS  . SPONDYLOLISTHESIS  . SLEEP APNEA  . Strep pharyngitis  . Acute bronchitis  . Maxillary sinusitis, acute  . Palpitations  . Skin lesion   Past Medical History  Diagnosis Date  . Osteoporosis    Past Surgical History  Procedure Date  . Breast surgery 1991    breast biopsy  . Abdominal hysterectomy 1995    partial   History  Substance Use Topics  . Smoking status: Never Smoker   . Smokeless tobacco: Not on file  . Alcohol Use: Not on file   No family history on file. Allergies  Allergen Reactions  . Adalimumab     REACTION: frequent sinus infection  . Sulfamethoxazole W/Trimethoprim     REACTION: face and hands rash and felt burning sensation  .  Tramadol Hcl     REACTION: nausea   Current Outpatient Prescriptions on File Prior to Visit  Medication Sig Dispense Refill  . albuterol (PROAIR HFA) 108 (90 BASE) MCG/ACT inhaler Inhale 2 puffs into the lungs every 4 (four) hours as needed.        Marland Kitchen aspirin 81 MG tablet Take 81 mg by mouth daily.        . Calcium Citrate-Vitamin D (CITRACAL/VITAMIN D PO) Take 1 tablet by mouth daily.        . Cholecalciferol (VITAMIN D3) 2000 UNITS TABS Take 1 tablet by mouth daily.        Marland Kitchen dexlansoprazole (DEXILANT) 60 MG capsule Take 60 mg by mouth daily.        . Estradiol (VAGIFEM) 10 MCG TABS Insert 1 tab vaginally twice per week as directed.       Marland Kitchen estradiol (VIVELLE-DOT) 0.025 MG/24HR Place 1 patch onto the skin 2 (two) times a week.        . etanercept (ENBREL) 50 MG/ML injection Inject 50 mg into the skin once a week.        . fluticasone (FLONASE) 50 MCG/ACT nasal spray 2 sprays by Nasal route daily.        Marland Kitchen  Folic Acid-Vit B6-Vit B12 (FOLGARD RX) 2.2-25-1 MG TABS Take 1 tablet by mouth daily.        . methotrexate 25 MG/ML SOLN Inject 1 ml under the skin every week       . Multiple Vitamin (MULTIVITAMIN) capsule Take 1 capsule by mouth daily.        . Omega-3 Fatty Acids (FISH OIL PO) Take 1 capsule by mouth daily.      . pilocarpine (SALAGEN) 5 MG tablet Take 5 mg by mouth 3 (three) times daily.       Marland Kitchen POLYVIN ALC-POVIDON DIMETHYLAM (FRESHKOTE) 2.7-2 % SOLN 1 drop into both eyes three times a day       . RABEprazole (ACIPHEX) 20 MG tablet Take 20 mg by mouth daily.      Marland Kitchen zolpidem (AMBIEN) 5 MG tablet Take 5 mg by mouth at bedtime as needed.              Review of Systems Review of Systems  Constitutional: Negative for fever, appetite change, fatigue and unexpected weight change.  Eyes: Negative for pain and visual disturbance.  Respiratory: Negative for cough and shortness of breath.   Cardiovascular: Negative for cp or sob with exertoin, neg for PND or orthopnea or LE edema    Gastrointestinal: Negative for nausea, diarrhea and constipation.  Genitourinary: Negative for urgency and frequency.  Skin: Negative for pallor or rash  pos for lesion and pos for freckles all over  Neurological: Negative for weakness, light-headedness, numbness and headaches.  Hematological: Negative for adenopathy. Does not bruise/bleed easily.  Psychiatric/Behavioral: Negative for dysphoric mood. The patient is not nervous/anxious.         Objective:   Physical Exam  Constitutional: She appears well-developed and well-nourished. No distress.  HENT:  Head: Normocephalic and atraumatic.  Mouth/Throat: Oropharynx is clear and moist. No oropharyngeal exudate.  Eyes: Conjunctivae and EOM are normal. Pupils are equal, round, and reactive to light. No scleral icterus.  Neck: Normal range of motion. Neck supple. No JVD present. Carotid bruit is not present. No thyromegaly present.  Cardiovascular: Normal rate, regular rhythm and intact distal pulses.  Exam reveals no gallop and no friction rub.   No murmur heard. Pulmonary/Chest: Effort normal and breath sounds normal. No respiratory distress. She has no wheezes. She has no rales. She exhibits no tenderness.  Abdominal: Soft. Bowel sounds are normal. She exhibits no distension, no abdominal bruit and no mass. There is no tenderness.  Musculoskeletal: She exhibits no edema and no tenderness.  Lymphadenopathy:    She has no cervical adenopathy.  Neurological: She is alert. She has normal reflexes. No cranial nerve deficit. She exhibits normal muscle tone. Coordination normal.  Skin: Skin is warm and dry. No rash noted. No erythema. No pallor.       Macule on R mid chest - flat and tan / regular shape and not scaley  Psychiatric: She has a normal mood and affect.       Overall relaxed Good eye contact and comm skills Talks freely of her stressors           Assessment & Plan:

## 2011-03-04 NOTE — Assessment & Plan Note (Signed)
Macule on chest - looks to be solar/ lentigo - will watch this  If enlarge/ shape/ color change it should be eval  Disc use of sun protection - pt is good about this

## 2011-03-04 NOTE — Patient Instructions (Signed)
For palpitations - please stop coffee / all caffeine - this may eliminate your palpitations Also please consider counseling in the future - and call me if you are interested in a referral  Stay active  If your symptoms worsen- update me  If stopping coffee does not help in 2-4 weeks - call and I will refer you to cardiology Keep eye on spot on chest - shape change or growth--let me know

## 2011-03-04 NOTE — Assessment & Plan Note (Signed)
Ongoing for years- worse with caffeine and stress Rev EKG - with PAC- consistent with old holter in 07 Will stop caffeine entirely Also consider counseling for stress issues If not improved-I do want to ref to cardiol for further eval Beta blocker/ ca channel blocker not option now due to low bp and pulse rate

## 2011-03-17 DIAGNOSIS — Z79899 Other long term (current) drug therapy: Secondary | ICD-10-CM | POA: Diagnosis not present

## 2011-05-22 DIAGNOSIS — Z79899 Other long term (current) drug therapy: Secondary | ICD-10-CM | POA: Diagnosis not present

## 2011-06-09 ENCOUNTER — Ambulatory Visit: Payer: Medicare Other | Admitting: Family Medicine

## 2011-06-09 ENCOUNTER — Encounter: Payer: Self-pay | Admitting: Family Medicine

## 2011-06-09 ENCOUNTER — Ambulatory Visit (INDEPENDENT_AMBULATORY_CARE_PROVIDER_SITE_OTHER): Payer: Medicare Other | Admitting: Family Medicine

## 2011-06-09 VITALS — BP 108/68 | HR 72 | Temp 98.5°F | Wt 159.0 lb

## 2011-06-09 DIAGNOSIS — H109 Unspecified conjunctivitis: Secondary | ICD-10-CM | POA: Diagnosis not present

## 2011-06-09 MED ORDER — CIPROFLOXACIN HCL 0.3 % OP SOLN: 1.0000 [drp] | OPHTHALMIC | Status: AC

## 2011-06-09 NOTE — Patient Instructions (Signed)
Use the drops, wash you hands, and this should improve.  Take care.

## 2011-06-09 NOTE — Assessment & Plan Note (Signed)
This could be viral, ddx d/w pt.  Given her asymmetry of exam and immunosuppression, I would treat for presumed bacterial source.  She agrees with plan.  Nontoxic, f/u prn.

## 2011-06-09 NOTE — Progress Notes (Signed)
Caring for grandson.  He had a cold and red eyes.  Pt has RA and over last week had B eye discharge.  Over the weekend, she had some aches. Since then R eye is improved (less discharge now) and L eye still has significant discharge.  Vision is at baseline when discharge is removed.  Wears glasses, not contacts.  Mild nasal congestion and cough.  Fatigued.  No known FB, no eye pain, but eyes feels irritated.   Meds, vitals, and allergies reviewed.   ROS: See HPI.  Otherwise, noncontributory.  nad ncat Tm wnl Nasal and OP exam unremarkable MMM rrr ctab Neck supple, no LA PERRL, EOMI Lids wnl and no FB seen in either eye.   Limbus sparing conjunctival injection noted, L>R nondilated fundus exam wnl

## 2011-06-17 ENCOUNTER — Encounter: Payer: Self-pay | Admitting: Family Medicine

## 2011-06-17 ENCOUNTER — Ambulatory Visit (INDEPENDENT_AMBULATORY_CARE_PROVIDER_SITE_OTHER): Payer: Medicare Other | Admitting: Family Medicine

## 2011-06-17 ENCOUNTER — Other Ambulatory Visit: Payer: Self-pay

## 2011-06-17 VITALS — BP 98/64 | HR 73 | Temp 97.8°F | Ht 66.0 in | Wt 158.0 lb

## 2011-06-17 DIAGNOSIS — J029 Acute pharyngitis, unspecified: Secondary | ICD-10-CM | POA: Diagnosis not present

## 2011-06-17 DIAGNOSIS — H6690 Otitis media, unspecified, unspecified ear: Secondary | ICD-10-CM | POA: Diagnosis not present

## 2011-06-17 DIAGNOSIS — H6692 Otitis media, unspecified, left ear: Secondary | ICD-10-CM | POA: Insufficient documentation

## 2011-06-17 DIAGNOSIS — H2589 Other age-related cataract: Secondary | ICD-10-CM | POA: Diagnosis not present

## 2011-06-17 LAB — POCT RAPID STREP A (OFFICE): Rapid Strep A Screen: NEGATIVE

## 2011-06-17 MED ORDER — ALBUTEROL SULFATE HFA 108 (90 BASE) MCG/ACT IN AERS: 2.0000 | INHALATION_SPRAY | RESPIRATORY_TRACT | Status: DC | PRN

## 2011-06-17 MED ORDER — AMOXICILLIN 500 MG PO CAPS: 500.0000 mg | ORAL_CAPSULE | Freq: Three times a day (TID) | ORAL | Status: DC

## 2011-06-17 MED ORDER — AMOXICILLIN 500 MG PO CAPS: 500.0000 mg | ORAL_CAPSULE | Freq: Three times a day (TID) | ORAL | Status: AC

## 2011-06-17 NOTE — Patient Instructions (Signed)
Drink lots of water and get rest  Take the amoxicillin as directed for left ear infection  Negative strep test today  mucinex helps congestion  Tylenol / salt water gargles / chloraseptic throat spray help sore throat

## 2011-06-17 NOTE — Progress Notes (Signed)
Subjective:    Patient ID: Deanna Norris, female    DOB: 05/15/1945, 66 y.o.   MRN: [id number]  HPI Her for sore throat and ear fullness and sinus congestion (no pain) Ear L was hurting - some better now , but some trouble hearing out of it  Clear mucous - nasal and chest  Throat is moderately sore and a little red  Wants to be checked for strep because she is around children  Cough too phlegm prod  Had a cold 1 week ago  Also came in for pink eye - that is better with treatment   Takes methotrexate and Enbrel - immunocompromised status  Skipped dose last week and now symptoms   No fever - but does get hot and cold at times  Patient Active Problem List  Diagnoses  . PLANTAR WART  . HYPERLIPIDEMIA  . STRESS REACTION, ACUTE, WITH EMOTIONAL DISTURBANCE  . DRY EYE SYNDROME  . ASTHMA  . GERD  . POSTMENOPAUSAL STATUS  . MENOPAUSAL DISORDER  . ARTHRITIS, RHEUMATOID  . OSTEOPOROSIS  . SPONDYLOLISTHESIS  . SLEEP APNEA  . Palpitations  . Skin lesion  . Conjunctivitis   Past Medical History  Diagnosis Date  . Osteoporosis    Past Surgical History  Procedure Date  . Breast surgery 1991    breast biopsy  . Abdominal hysterectomy 1995    partial   History  Substance Use Topics  . Smoking status: Never Smoker   . Smokeless tobacco: Not on file  . Alcohol Use: Not on file   No family history on file. Allergies  Allergen Reactions  . Adalimumab     REACTION: frequent sinus infection  . Sulfamethoxazole W-Trimethoprim     REACTION: face and hands rash and felt burning sensation  . Tramadol Hcl     REACTION: nausea   Current Outpatient Prescriptions on File Prior to Visit  Medication Sig Dispense Refill  . albuterol (PROAIR HFA) 108 (90 BASE) MCG/ACT inhaler Inhale 2 puffs into the lungs every 4 (four) hours as needed.        Marland Kitchen aspirin 81 MG tablet Take 81 mg by mouth daily.        . Calcium Citrate-Vitamin D (CITRACAL/VITAMIN D PO) Take 1 tablet by mouth  daily.        . Cholecalciferol (VITAMIN D3) 2000 UNITS TABS Take 1 tablet by mouth daily.        . Estradiol (VAGIFEM) 10 MCG TABS Insert 1 tab vaginally twice per week as directed.       Marland Kitchen estradiol (VIVELLE-DOT) 0.025 MG/24HR Place 1 patch onto the skin 2 (two) times a week.        . etanercept (ENBREL) 50 MG/ML injection Inject 50 mg into the skin once a week.        . fluticasone (FLONASE) 50 MCG/ACT nasal spray Place 2 sprays into the nose daily as needed.       . Folic Acid-Vit B6-Vit B12 (FOLGARD RX) 2.2-25-1 MG TABS Take 1 tablet by mouth daily.        . methotrexate 25 MG/ML SOLN Inject 1 ml under the skin every week       . Multiple Vitamin (MULTIVITAMIN) capsule Take 1 capsule by mouth daily.        . Omega-3 Fatty Acids (FISH OIL PO) Take 1 capsule by mouth daily.      . pilocarpine (SALAGEN) 5 MG tablet Take 5 mg by mouth 3 (three) times daily.       Marland Kitchen  RABEprazole (ACIPHEX) 20 MG tablet Take 20 mg by mouth daily.      Marland Kitchen zolpidem (AMBIEN) 5 MG tablet Take 5 mg by mouth at bedtime as needed.        . ciprofloxacin (CILOXAN) 0.3 % ophthalmic solution Apply 1 drop to eye every 4 (four) hours. 1 drop every 4 hours in affected eye while awake for 5 days.  5 mL  0  . dexlansoprazole (DEXILANT) 60 MG capsule Take 60 mg by mouth daily.        Marland Kitchen POLYVIN ALC-POVIDON DIMETHYLAM (FRESHKOTE) 2.7-2 % SOLN 1 drop into both eyes three times a day           Review of Systems Review of Systems  Constitutional: Negative for fever, appetite change, and unexpected weight change. pos for fatigue  Eyes: Negative for pain and visual disturbance. ENT pos for congestion and st and ear pain, neg for sinus ptin   Respiratory: neg for sob or wheeze Cardiovascular: Negative for cp or palpitations    Gastrointestinal: Negative for nausea, diarrhea and constipation.  Genitourinary: Negative for urgency and frequency.  Skin: Negative for pallor or rash   Neurological: Negative for weakness,  light-headedness, numbness and headaches.  Hematological: Negative for adenopathy. Does not bruise/bleed easily.  Psychiatric/Behavioral: Negative for dysphoric mood. The patient is not nervous/anxious.         Objective:   Physical Exam  Constitutional: She appears well-developed and well-nourished. No distress.  HENT:  Head: Normocephalic and atraumatic.  Right Ear: External ear normal.  Mouth/Throat: No oropharyngeal exudate.       Nares are injected and congested  No sinus tenderness  L TM is erythematous with eff but no bulging  Throat-diffuse erythema no exudate or swelling or lesions  Eyes: Conjunctivae and EOM are normal. Pupils are equal, round, and reactive to light. Right eye exhibits no discharge. Left eye exhibits no discharge.  Neck: Normal range of motion. Neck supple. No thyromegaly present.  Cardiovascular: Normal rate and regular rhythm.   No murmur heard. Pulmonary/Chest: Effort normal and breath sounds normal. No respiratory distress. She has no wheezes. She has no rales.  Lymphadenopathy:    She has no cervical adenopathy.  Neurological: She is alert.  Skin: Skin is warm and dry. No rash noted. No erythema. No pallor.  Psychiatric: She has a normal mood and affect.          Assessment & Plan:

## 2011-06-17 NOTE — Assessment & Plan Note (Signed)
With the end of uri Will cover with amoxicillin  When done and asymptomatic may resume rheumatoid medication  Disc symptomatic care - see instructions on AVS  Update if not starting to improve in a week or if worsening

## 2011-06-17 NOTE — Telephone Encounter (Signed)
[facility] power out and pt wants me to call Casimiro Needle at [facility] [phone] and give verbal order for amoxil and proair. Done and pt notified can go and pick up rx while on phone.

## 2011-06-17 NOTE — Assessment & Plan Note (Signed)
Rapid strep neg Think due to uri  Update if not starting to improve in a week or if worsening   Disc symptomatic care - see instructions on AVS

## 2011-06-23 ENCOUNTER — Telehealth: Payer: Self-pay | Admitting: Family Medicine

## 2011-06-23 NOTE — Telephone Encounter (Signed)
Caller: Evelene/Patient; PCP: Tower, Marne A.; CB#: [phone]; ; ; Call regarding Treated for Ear Infection and Is On Day 8. of Amoxicillin and She Still Has Problems, Going Out of State and ?. If Needs A. Different Medication;  Pts ears are still blocked/she is having trouble hearing. Pt was dx with an ear infection 8 days ago. She has been taking Amoxicillin. There has been some improvement but it is not resolved and she is going out of town for 10 days. Pt is afeb/there is no drainage. RN triaged and advsied appt. Appt scheduled 6/18 with Dr. Milinda Antis at 10:15.

## 2011-06-23 NOTE — Telephone Encounter (Signed)
Will take another look then

## 2011-06-24 ENCOUNTER — Encounter: Payer: Self-pay | Admitting: Family Medicine

## 2011-06-24 ENCOUNTER — Ambulatory Visit (INDEPENDENT_AMBULATORY_CARE_PROVIDER_SITE_OTHER): Payer: Medicare Other | Admitting: Family Medicine

## 2011-06-24 VITALS — BP 88/64 | HR 65 | Temp 97.8°F | Ht 66.0 in | Wt 159.2 lb

## 2011-06-24 DIAGNOSIS — H6690 Otitis media, unspecified, unspecified ear: Secondary | ICD-10-CM

## 2011-06-24 DIAGNOSIS — H6692 Otitis media, unspecified, left ear: Secondary | ICD-10-CM

## 2011-06-24 DIAGNOSIS — H6980 Other specified disorders of Eustachian tube, unspecified ear: Secondary | ICD-10-CM | POA: Diagnosis not present

## 2011-06-24 NOTE — Progress Notes (Signed)
Subjective:    Patient ID: Deanna Norris, female    DOB: 1945/03/11, 66 y.o.   MRN: [id number]  HPI Was last here for uri and also L OM   (after an exac of pink eye)  Started eye drops again - fine with that   Ears are overall much better but still full feeling mostly in the L  No pain  Some diminished hearing  No fever   Patient Active Problem List  Diagnosis  . PLANTAR WART  . HYPERLIPIDEMIA  . STRESS REACTION, ACUTE, WITH EMOTIONAL DISTURBANCE  . DRY EYE SYNDROME  . ASTHMA  . GERD  . POSTMENOPAUSAL STATUS  . MENOPAUSAL DISORDER  . ARTHRITIS, RHEUMATOID  . OSTEOPOROSIS  . SPONDYLOLISTHESIS  . SLEEP APNEA  . Palpitations  . Skin lesion  . Conjunctivitis  . Left otitis media  . Sore throat   Past Medical History  Diagnosis Date  . Osteoporosis    Past Surgical History  Procedure Date  . Breast surgery 1991    breast biopsy  . Abdominal hysterectomy 1995    partial   History  Substance Use Topics  . Smoking status: Never Smoker   . Smokeless tobacco: Not on file  . Alcohol Use: Not on file   No family history on file. Allergies  Allergen Reactions  . Adalimumab     REACTION: frequent sinus infection  . Sulfamethoxazole W-Trimethoprim     REACTION: face and hands rash and felt burning sensation  . Tramadol Hcl     REACTION: nausea   Current Outpatient Prescriptions on File Prior to Visit  Medication Sig Dispense Refill  . albuterol (PROAIR HFA) 108 (90 BASE) MCG/ACT inhaler Inhale 2 puffs into the lungs every 4 (four) hours as needed.  1 Inhaler  3  . amoxicillin (AMOXIL) 500 MG capsule Take 1 capsule (500 mg total) by mouth 3 (three) times daily.  30 capsule  0  . aspirin 81 MG tablet Take 81 mg by mouth daily.        . Calcium Citrate-Vitamin D (CITRACAL/VITAMIN D PO) Take 1 tablet by mouth daily.        . Cholecalciferol (VITAMIN D3) 2000 UNITS TABS Take 1 tablet by mouth daily.        Marland Kitchen dexlansoprazole (DEXILANT) 60 MG capsule Take 60 mg by  mouth daily.        . Estradiol (VAGIFEM) 10 MCG TABS Insert 1 tab vaginally twice per week as directed.       Marland Kitchen estradiol (VIVELLE-DOT) 0.025 MG/24HR Place 1 patch onto the skin 2 (two) times a week.        . fluticasone (FLONASE) 50 MCG/ACT nasal spray Place 2 sprays into the nose daily as needed.       . Folic Acid-Vit B6-Vit B12 (FOLGARD RX) 2.2-25-1 MG TABS Take 1 tablet by mouth daily.        . Multiple Vitamin (MULTIVITAMIN) capsule Take 1 capsule by mouth daily.        . Omega-3 Fatty Acids (FISH OIL PO) Take 1 capsule by mouth daily.      . pilocarpine (SALAGEN) 5 MG tablet Take 5 mg by mouth 3 (three) times daily.       Marland Kitchen POLYVIN ALC-POVIDON DIMETHYLAM (FRESHKOTE) 2.7-2 % SOLN 1 drop into both eyes three times a day       . RABEprazole (ACIPHEX) 20 MG tablet Take 20 mg by mouth daily.      Marland Kitchen zolpidem (AMBIEN) 5  MG tablet Take 5 mg by mouth at bedtime as needed.        . etanercept (ENBREL) 50 MG/ML injection Inject 50 mg into the skin once a week.        . methotrexate 25 MG/ML SOLN Inject 1 ml under the skin every week           Review of Systems Review of Systems  Constitutional: Negative for fever, appetite change, fatigue and unexpected weight change.  Eyes: Negative for pain and visual disturbance.  ENT pos for bilateral ear fullness without pain / no drainage/ ST or headache/ sinus pain  Respiratory: Negative for cough and shortness of breath.   Cardiovascular: Negative for cp or palpitations    Gastrointestinal: Negative for nausea, diarrhea and constipation.  Genitourinary: Negative for urgency and frequency.  Skin: Negative for pallor or rash   Neurological: Negative for weakness, light-headedness, numbness and headaches.  Hematological: Negative for adenopathy. Does not bruise/bleed easily.  Psychiatric/Behavioral: Negative for dysphoric mood. The patient is not nervous/anxious.         Objective:   Physical Exam  Constitutional: She appears well-developed and  well-nourished. No distress.  HENT:  Head: Normocephalic and atraumatic.  Mouth/Throat: Oropharynx is clear and moist. No oropharyngeal exudate.       Nares are boggy but clear TMs are dull - with small eff No erythema or bulging  Eyes: Conjunctivae and EOM are normal. Pupils are equal, round, and reactive to light. Right eye exhibits no discharge. Left eye exhibits no discharge.  Neck: Normal range of motion. Neck supple.  Cardiovascular: Normal rate and regular rhythm.   Pulmonary/Chest: Effort normal and breath sounds normal. No respiratory distress. She has no wheezes.  Lymphadenopathy:    She has no cervical adenopathy.  Neurological: She is alert. She has normal reflexes. No cranial nerve deficit.  Skin: Skin is warm and dry. No rash noted.  Psychiatric: She has a normal mood and affect.          Assessment & Plan:

## 2011-06-24 NOTE — Patient Instructions (Addendum)
Start using flonase 2 sprays in each nostril daily for at least 2-4 weeks Infection looks better Finish the antibiotic  If not improved in 2 weeks let me know

## 2011-06-24 NOTE — Assessment & Plan Note (Signed)
Much imp but with residual effusion Will use flonase for that  Finish abx Update if not starting to improve in a week or if worsening

## 2011-06-24 NOTE — Assessment & Plan Note (Signed)
After uri and OM (the OM is much better and will finish abx) Suggested flonase daily for 2-4 weeks to help open ear system and drain it  Update if not starting to improve in 1-2  week or if worsening

## 2011-06-30 DIAGNOSIS — E559 Vitamin D deficiency, unspecified: Secondary | ICD-10-CM | POA: Diagnosis not present

## 2011-06-30 DIAGNOSIS — Z124 Encounter for screening for malignant neoplasm of cervix: Secondary | ICD-10-CM | POA: Diagnosis not present

## 2011-06-30 DIAGNOSIS — Z01419 Encounter for gynecological examination (general) (routine) without abnormal findings: Secondary | ICD-10-CM | POA: Diagnosis not present

## 2011-07-08 DIAGNOSIS — M25579 Pain in unspecified ankle and joints of unspecified foot: Secondary | ICD-10-CM | POA: Diagnosis not present

## 2011-07-08 DIAGNOSIS — M069 Rheumatoid arthritis, unspecified: Secondary | ICD-10-CM | POA: Diagnosis not present

## 2011-07-08 DIAGNOSIS — M5137 Other intervertebral disc degeneration, lumbosacral region: Secondary | ICD-10-CM | POA: Diagnosis not present

## 2011-07-08 DIAGNOSIS — M19049 Primary osteoarthritis, unspecified hand: Secondary | ICD-10-CM | POA: Diagnosis not present

## 2011-07-08 DIAGNOSIS — M25569 Pain in unspecified knee: Secondary | ICD-10-CM | POA: Diagnosis not present

## 2011-07-16 DIAGNOSIS — Z79899 Other long term (current) drug therapy: Secondary | ICD-10-CM | POA: Diagnosis not present

## 2011-07-16 DIAGNOSIS — M069 Rheumatoid arthritis, unspecified: Secondary | ICD-10-CM | POA: Diagnosis not present

## 2011-07-28 ENCOUNTER — Encounter: Payer: Self-pay | Admitting: Family Medicine

## 2011-07-28 ENCOUNTER — Ambulatory Visit (INDEPENDENT_AMBULATORY_CARE_PROVIDER_SITE_OTHER): Payer: Medicare Other | Admitting: Family Medicine

## 2011-07-28 VITALS — BP 115/62 | HR 74 | Temp 98.3°F | Ht 66.0 in | Wt 156.8 lb

## 2011-07-28 DIAGNOSIS — J020 Streptococcal pharyngitis: Secondary | ICD-10-CM

## 2011-07-28 MED ORDER — AMOXICILLIN 500 MG PO CAPS: 500.0000 mg | ORAL_CAPSULE | Freq: Three times a day (TID) | ORAL | Status: AC

## 2011-07-28 NOTE — Patient Instructions (Addendum)
Hold your rheumatoid medicines until you feel better  Take the amoxicillin as directed for strep throat  Drink lots of fluids Tylenol for fever Get rest  Update if not starting to improve in a week or if worsening

## 2011-07-28 NOTE — Assessment & Plan Note (Signed)
ST and fever and pos rapid strep test tx with amox Hold her rheumatoid meds until resolved Disc symptomatic care - see instructions on AVS  Update if not starting to improve in a week or if worsening

## 2011-07-28 NOTE — Progress Notes (Signed)
Subjective:    Patient ID: Deanna Norris, female    DOB: 29-Sep-1945, 66 y.o.   MRN: [id number]  HPI Here for ST and fever  ST started on Friday - very small amount of congestion  achey all over , with fever Last night 100.8  This am - feeling better Taking tylenol and aspirin No rash  No tick bites   [place]- was around some grandchildren   Patient Active Problem List  Diagnosis  . PLANTAR WART  . HYPERLIPIDEMIA  . STRESS REACTION, ACUTE, WITH EMOTIONAL DISTURBANCE  . DRY EYE SYNDROME  . ASTHMA  . GERD  . POSTMENOPAUSAL STATUS  . MENOPAUSAL DISORDER  . ARTHRITIS, RHEUMATOID  . OSTEOPOROSIS  . SPONDYLOLISTHESIS  . SLEEP APNEA  . Palpitations  . Skin lesion  . Conjunctivitis  . Left otitis media  . Sore throat  . ETD (eustachian tube dysfunction)   Past Medical History  Diagnosis Date  . Osteoporosis    Past Surgical History  Procedure Date  . Breast surgery 1991    breast biopsy  . Abdominal hysterectomy 1995    partial   History  Substance Use Topics  . Smoking status: Never Smoker   . Smokeless tobacco: Not on file  . Alcohol Use: No   No family history on file. Allergies  Allergen Reactions  . Adalimumab     REACTION: frequent sinus infection  . Sulfamethoxazole W-Trimethoprim     REACTION: face and hands rash and felt burning sensation  . Tramadol Hcl     REACTION: nausea   Current Outpatient Prescriptions on File Prior to Visit  Medication Sig Dispense Refill  . albuterol (PROAIR HFA) 108 (90 BASE) MCG/ACT inhaler Inhale 2 puffs into the lungs every 4 (four) hours as needed.  1 Inhaler  3  . aspirin 81 MG tablet Take 81 mg by mouth daily.        . Calcium Citrate-Vitamin D (CITRACAL/VITAMIN D PO) Take 1 tablet by mouth daily.        . Cholecalciferol (VITAMIN D3) 2000 UNITS TABS Take 1 tablet by mouth daily.        Marland Kitchen dexlansoprazole (DEXILANT) 60 MG capsule Take 60 mg by mouth daily.        . Estradiol (VAGIFEM) 10  MCG TABS Insert 1 tab vaginally twice per week as directed.       Marland Kitchen estradiol (VIVELLE-DOT) 0.025 MG/24HR Place 1 patch onto the skin 2 (two) times a week.        . etanercept (ENBREL) 50 MG/ML injection Inject 50 mg into the skin once a week.        . fluticasone (FLONASE) 50 MCG/ACT nasal spray Place 2 sprays into the nose daily as needed.       . Folic Acid-Vit B6-Vit B12 (FOLGARD RX) 2.2-25-1 MG TABS Take 1 tablet by mouth daily.        . methotrexate 25 MG/ML SOLN Inject 1 ml under the skin every week       . Multiple Vitamin (MULTIVITAMIN) capsule Take 1 capsule by mouth daily.        . Omega-3 Fatty Acids (FISH OIL PO) Take 1 capsule by mouth daily.      . pilocarpine (SALAGEN) 5 MG tablet Take 5 mg by mouth 3 (three) times daily.       . RABEprazole (ACIPHEX) 20 MG tablet Take 20 mg by mouth daily.      Marland Kitchen zolpidem (  AMBIEN) 5 MG tablet Take 5 mg by mouth at bedtime as needed.        . ciprofloxacin (CILOXAN) 0.3 % ophthalmic ointment Apply 1 drop to eye every 4 (four) hours.      Marland Kitchen POLYVIN ALC-POVIDON DIMETHYLAM (FRESHKOTE) 2.7-2 % SOLN 1 drop into both eyes three times a day          Review of Systems Review of Systems  Constitutional: pos for fever / malaise/ dec in appetite.  ENT pos for sore throat, neg for sinus pain  Eyes: Negative for pain and visual disturbance.  Respiratory: Negative for cough and shortness of breath.   Cardiovascular: Negative for cp or palpitations    Gastrointestinal: Negative for nausea, diarrhea and constipation.  Genitourinary: Negative for urgency and frequency.  Skin: Negative for pallor or rash   Neurological: Negative for weakness, light-headedness, numbness and headaches.  Hematological: Negative for adenopathy. Does not bruise/bleed easily.  Psychiatric/Behavioral: Negative for dysphoric mood. The patient is not nervous/anxious.         Objective:   Physical Exam  Constitutional: She appears well-developed and well-nourished. No distress.    HENT:  Head: Normocephalic and atraumatic.  Right Ear: External ear normal.  Left Ear: External ear normal.  Nose: Nose normal.  Mouth/Throat: No oropharyngeal exudate.       Diffuse erythema of throat  No exudate or swelling or lesions  Eyes: Conjunctivae and EOM are normal. Pupils are equal, round, and reactive to light. Right eye exhibits no discharge. Left eye exhibits no discharge.  Neck: Normal range of motion. Neck supple.  Cardiovascular: Normal rate and regular rhythm.   Pulmonary/Chest: Effort normal and breath sounds normal. No respiratory distress. She has no wheezes.  Lymphadenopathy:    She has no cervical adenopathy.  Neurological: She is alert.  Skin: Skin is warm and dry. No rash noted.  Psychiatric: She has a normal mood and affect.          Assessment & Plan:

## 2011-08-22 DIAGNOSIS — Z0000 Encounter for general adult medical examination without abnormal findings: Secondary | ICD-10-CM | POA: Diagnosis not present

## 2011-09-03 DIAGNOSIS — Z79899 Other long term (current) drug therapy: Secondary | ICD-10-CM | POA: Diagnosis not present

## 2011-09-22 DIAGNOSIS — M79609 Pain in unspecified limb: Secondary | ICD-10-CM | POA: Diagnosis not present

## 2011-09-22 DIAGNOSIS — S93609A Unspecified sprain of unspecified foot, initial encounter: Secondary | ICD-10-CM | POA: Diagnosis not present

## 2011-09-22 DIAGNOSIS — M7750 Other enthesopathy of unspecified foot: Secondary | ICD-10-CM | POA: Diagnosis not present

## 2011-10-06 DIAGNOSIS — Z1231 Encounter for screening mammogram for malignant neoplasm of breast: Secondary | ICD-10-CM | POA: Diagnosis not present

## 2011-10-15 DIAGNOSIS — N6489 Other specified disorders of breast: Secondary | ICD-10-CM | POA: Diagnosis not present

## 2011-10-15 DIAGNOSIS — N6459 Other signs and symptoms in breast: Secondary | ICD-10-CM | POA: Diagnosis not present

## 2011-10-20 DIAGNOSIS — Z23 Encounter for immunization: Secondary | ICD-10-CM | POA: Diagnosis not present

## 2011-10-31 DIAGNOSIS — Z79899 Other long term (current) drug therapy: Secondary | ICD-10-CM | POA: Diagnosis not present

## 2011-11-10 DIAGNOSIS — M810 Age-related osteoporosis without current pathological fracture: Secondary | ICD-10-CM | POA: Diagnosis not present

## 2011-11-10 DIAGNOSIS — M069 Rheumatoid arthritis, unspecified: Secondary | ICD-10-CM | POA: Diagnosis not present

## 2011-11-10 DIAGNOSIS — Z79899 Other long term (current) drug therapy: Secondary | ICD-10-CM | POA: Diagnosis not present

## 2011-11-10 DIAGNOSIS — M19049 Primary osteoarthritis, unspecified hand: Secondary | ICD-10-CM | POA: Diagnosis not present

## 2011-11-12 DIAGNOSIS — M810 Age-related osteoporosis without current pathological fracture: Secondary | ICD-10-CM | POA: Diagnosis not present

## 2012-01-07 HISTORY — PX: MELANOMA EXCISION: SHX5266

## 2012-01-08 DIAGNOSIS — Z79899 Other long term (current) drug therapy: Secondary | ICD-10-CM | POA: Diagnosis not present

## 2012-02-01 ENCOUNTER — Telehealth: Payer: Self-pay | Admitting: Family Medicine

## 2012-02-01 DIAGNOSIS — K219 Gastro-esophageal reflux disease without esophagitis: Secondary | ICD-10-CM

## 2012-02-01 DIAGNOSIS — E785 Hyperlipidemia, unspecified: Secondary | ICD-10-CM

## 2012-02-01 DIAGNOSIS — M810 Age-related osteoporosis without current pathological fracture: Secondary | ICD-10-CM

## 2012-02-01 NOTE — Telephone Encounter (Signed)
Message copied by Judy Pimple on Sun Feb 01, 2012  5:56 PM ------      Message from: Alvina Chou      Created: Wed Jan 28, 2012 11:56 AM      Regarding: lab orders for Monday, 1.27.14       Patient is scheduled for CPX labs, please order future labs, Thanks , Camelia Eng

## 2012-02-02 ENCOUNTER — Other Ambulatory Visit (INDEPENDENT_AMBULATORY_CARE_PROVIDER_SITE_OTHER): Payer: Medicare Other

## 2012-02-02 DIAGNOSIS — K219 Gastro-esophageal reflux disease without esophagitis: Secondary | ICD-10-CM

## 2012-02-02 DIAGNOSIS — M810 Age-related osteoporosis without current pathological fracture: Secondary | ICD-10-CM | POA: Diagnosis not present

## 2012-02-02 DIAGNOSIS — E785 Hyperlipidemia, unspecified: Secondary | ICD-10-CM | POA: Diagnosis not present

## 2012-02-02 LAB — CBC WITH DIFFERENTIAL/PLATELET
Basophils Absolute: 0.1 10*3/uL (ref 0.0–0.1)
Basophils Relative: 1 % (ref 0.0–3.0)
Eosinophils Absolute: 0.3 10*3/uL (ref 0.0–0.7)
Hemoglobin: 14.8 g/dL (ref 12.0–15.0)
MCHC: 34.3 g/dL (ref 30.0–36.0)
MCV: 93.8 fl (ref 78.0–100.0)
Monocytes Absolute: 0.7 10*3/uL (ref 0.1–1.0)
Neutro Abs: 2.2 10*3/uL (ref 1.4–7.7)
Neutrophils Relative %: 40.6 % — ABNORMAL LOW (ref 43.0–77.0)
RBC: 4.59 Mil/uL (ref 3.87–5.11)
RDW: 14.9 % — ABNORMAL HIGH (ref 11.5–14.6)

## 2012-02-02 LAB — COMPREHENSIVE METABOLIC PANEL
ALT: 28 U/L (ref 0–35)
AST: 26 U/L (ref 0–37)
Alkaline Phosphatase: 63 U/L (ref 39–117)
BUN: 11 mg/dL (ref 6–23)
Creatinine, Ser: 0.6 mg/dL (ref 0.4–1.2)

## 2012-02-02 LAB — LIPID PANEL
HDL: 64 mg/dL (ref >39.00)
Total CHOL/HDL Ratio: 4
Triglycerides: 122 mg/dL (ref 0.0–149.0)
VLDL: 24.4 mg/dL (ref 0.0–40.0)

## 2012-02-03 LAB — VITAMIN D 25 HYDROXY (VIT D DEFICIENCY, FRACTURES): Vit D, 25-Hydroxy: 49 ng/mL (ref 30–89)

## 2012-02-06 DIAGNOSIS — C4360 Malignant melanoma of unspecified upper limb, including shoulder: Secondary | ICD-10-CM | POA: Diagnosis not present

## 2012-02-06 DIAGNOSIS — D485 Neoplasm of uncertain behavior of skin: Secondary | ICD-10-CM | POA: Diagnosis not present

## 2012-02-06 DIAGNOSIS — L819 Disorder of pigmentation, unspecified: Secondary | ICD-10-CM | POA: Diagnosis not present

## 2012-02-06 DIAGNOSIS — D1801 Hemangioma of skin and subcutaneous tissue: Secondary | ICD-10-CM | POA: Diagnosis not present

## 2012-02-09 ENCOUNTER — Ambulatory Visit (INDEPENDENT_AMBULATORY_CARE_PROVIDER_SITE_OTHER): Payer: Medicare Other | Admitting: Family Medicine

## 2012-02-09 ENCOUNTER — Encounter: Payer: Self-pay | Admitting: Family Medicine

## 2012-02-09 VITALS — BP 104/62 | HR 80 | Temp 98.3°F | Ht 66.0 in | Wt 167.8 lb

## 2012-02-09 DIAGNOSIS — M810 Age-related osteoporosis without current pathological fracture: Secondary | ICD-10-CM

## 2012-02-09 DIAGNOSIS — E785 Hyperlipidemia, unspecified: Secondary | ICD-10-CM

## 2012-02-09 DIAGNOSIS — N959 Unspecified menopausal and perimenopausal disorder: Secondary | ICD-10-CM | POA: Diagnosis not present

## 2012-02-09 NOTE — Assessment & Plan Note (Signed)
Lipids are up a bit - LDL over 130 Disc goals for lipids and reasons to control them Rev labs with pt Rev low sat fat diet in detail

## 2012-02-09 NOTE — Assessment & Plan Note (Signed)
Pt continues estrogen patch - understands pros/cons and risks -- treatment greatly helps symptoms and she will continue to discuss this with her gyn

## 2012-02-09 NOTE — Assessment & Plan Note (Signed)
Pt had dexa in the fall and this is followed by her rheumatologist  D level is tx , no falls or fx

## 2012-02-09 NOTE — Progress Notes (Signed)
Subjective:    Patient ID: Deanna Norris, female    DOB: [birth year], 67 y.o.   MRN: [id number]  HPI Here for check up of chronic medical conditions and to review health mt list   Doing pretty well overall  Keeping her RA under control  Her rheumatologist wants her to get pneumovax every 3 years - she is up to date    Wt is up 9 lb with bmi of 27 Has been eating too much  Doing yoga lately- enjoys that  spondylolithesis limits her  She does belong to a gym    Zoster status- cannot have that due to her medicines for RA   mammo was in the summer of 2013- followed by Dr Yolanda Bonine - august -- watching a lump  Self exam-no lumps   Hysterectomy in past  Sees a gyn , has a pap every 3 years -all was ok   colonosc 8/04= will be due this august   OP D level is 49 Falls/ fx --none at all , happy about that  dexa in chart 7/09- then she had another one -- in the fall ? Oct -- and never got a result  Goes to dexa in an orthop office - rheum ordered it    On vivelle dot - is working for her somewhat    Hyperlipidemia  Lab Results  Component Value Date   CHOL 234* 02/02/2012   Lab Results  Component Value Date   HDL 64.00 02/02/2012   No results found for this basename: [facility]   Lab Results  Component Value Date   TRIG 122.0 02/02/2012   Lab Results  Component Value Date   CHOLHDL 4 02/02/2012   Lab Results  Component Value Date   LDLDIRECT 137.3 02/02/2012   needs to be a bit lower Eats some fatty foods / ice cream   Mood- is very good / chipper / and not depressed   Patient Active Problem List  Diagnosis  . PLANTAR WART  . HYPERLIPIDEMIA  . STRESS REACTION, ACUTE, WITH EMOTIONAL DISTURBANCE  . DRY EYE SYNDROME  . ASTHMA  . GERD  . POSTMENOPAUSAL STATUS  . MENOPAUSAL DISORDER  . ARTHRITIS, RHEUMATOID  . OSTEOPOROSIS  . SPONDYLOLISTHESIS  . SLEEP APNEA  . ETD (eustachian tube dysfunction)   Past Medical History  Diagnosis Date  . Osteoporosis     Past Surgical History  Procedure Date  . Breast surgery 1991    breast biopsy  . Abdominal hysterectomy 1995    partial   History  Substance Use Topics  . Smoking status: Never Smoker   . Smokeless tobacco: Not on file  . Alcohol Use: No   No family history on file. Allergies  Allergen Reactions  . Adalimumab     REACTION: frequent sinus infection  . Methotrexate Derivatives Nausea Only    Oral metotrexate  . Sulfamethoxazole W-Trimethoprim     REACTION: face and hands rash and felt burning sensation  . Tramadol Hcl     REACTION: nausea   Current Outpatient Prescriptions on File Prior to Visit  Medication Sig Dispense Refill  . albuterol (PROAIR HFA) 108 (90 BASE) MCG/ACT inhaler Inhale 2 puffs into the lungs every 4 (four) hours as needed.  1 Inhaler  3  . aspirin 81 MG tablet Take 81 mg by mouth daily.        . Calcium Citrate-Vitamin D (CITRACAL/VITAMIN D PO) Take 1 tablet by mouth daily.        Marland Kitchen  Cholecalciferol (VITAMIN D3) 2000 UNITS TABS Take 1 tablet by mouth daily.        . Estradiol (VAGIFEM) 10 MCG TABS Insert 1 tab vaginally twice per week as directed.       Marland Kitchen estradiol (VIVELLE-DOT) 0.025 MG/24HR Place 1 patch onto the skin 2 (two) times a week.        . etanercept (ENBREL) 50 MG/ML injection Inject 50 mg into the skin once a week.        . fluticasone (FLONASE) 50 MCG/ACT nasal spray Place 2 sprays into the nose daily as needed.       . Folic Acid-Vit B6-Vit B12 (FOLGARD RX) 2.2-25-1 MG TABS Take 1 tablet by mouth daily.        . methotrexate 25 MG/ML SOLN Inject 1 ml under the skin every week       . Multiple Vitamin (MULTIVITAMIN) capsule Take 1 capsule by mouth daily.        . Omega-3 Fatty Acids (FISH OIL PO) Take 1 capsule by mouth daily.      . pilocarpine (SALAGEN) 5 MG tablet Take 5 mg by mouth 3 (three) times daily.       . RABEprazole (ACIPHEX) 20 MG tablet Take 20 mg by mouth daily.      Marland Kitchen zolpidem (AMBIEN) 5 MG tablet Take 5 mg by mouth at  bedtime as needed.              Review of Systems Review of Systems  Constitutional: Negative for fever, appetite change, fatigue and unexpected weight change.  Eyes: Negative for pain and visual disturbance.  Respiratory: Negative for cough and shortness of breath.   Cardiovascular: Negative for cp or palpitations    Gastrointestinal: Negative for nausea, diarrhea and constipation.  Genitourinary: Negative for urgency and frequency.  Skin: Negative for pallor or rash   MSK pos for joint pain that flares when off rheum medicines  Neurological: Negative for weakness, light-headedness, numbness and headaches.  Hematological: Negative for adenopathy. Does not bruise/bleed easily.  Psychiatric/Behavioral: Negative for dysphoric mood. The patient is not nervous/anxious.         Objective:   Physical Exam  Constitutional: She appears well-developed and well-nourished. No distress.  HENT:  Head: Normocephalic and atraumatic.  Right Ear: External ear normal.  Left Ear: External ear normal.  Nose: Nose normal.  Mouth/Throat: Oropharynx is clear and moist.  Eyes: Conjunctivae normal and EOM are normal. Pupils are equal, round, and reactive to light. Right eye exhibits no discharge. Left eye exhibits no discharge. No scleral icterus.  Neck: Normal range of motion. Neck supple. No JVD present. No thyromegaly present.  Cardiovascular: Normal rate, regular rhythm, normal heart sounds and intact distal pulses.  Exam reveals no gallop.   Pulmonary/Chest: Effort normal and breath sounds normal. No respiratory distress. She has no wheezes.  Abdominal: Soft. Bowel sounds are normal. She exhibits no distension and no mass. There is no tenderness.  Musculoskeletal: Normal range of motion. She exhibits no edema.  Lymphadenopathy:    She has no cervical adenopathy.  Neurological: She is alert. She has normal reflexes. No cranial nerve deficit. She exhibits normal muscle tone. Coordination normal.   Skin: Skin is warm and dry. No rash noted. No erythema. No pallor.  Psychiatric: She has a normal mood and affect.          Assessment & Plan:

## 2012-02-09 NOTE — Patient Instructions (Addendum)
Start some cardio exercise-like the bike or water exercise  Avoid red meat/ fried foods/ egg yolks/ fatty breakfast meats/ butter, cheese and high fat dairy/ and shellfish  - to help bring down cholesterol You are due for 10 year colonoscopy after august

## 2012-03-02 DIAGNOSIS — Z79899 Other long term (current) drug therapy: Secondary | ICD-10-CM | POA: Diagnosis not present

## 2012-03-02 DIAGNOSIS — C4360 Malignant melanoma of unspecified upper limb, including shoulder: Secondary | ICD-10-CM | POA: Diagnosis not present

## 2012-04-12 DIAGNOSIS — M19049 Primary osteoarthritis, unspecified hand: Secondary | ICD-10-CM | POA: Diagnosis not present

## 2012-04-12 DIAGNOSIS — M949 Disorder of cartilage, unspecified: Secondary | ICD-10-CM | POA: Diagnosis not present

## 2012-04-12 DIAGNOSIS — M899 Disorder of bone, unspecified: Secondary | ICD-10-CM | POA: Diagnosis not present

## 2012-04-12 DIAGNOSIS — Z79899 Other long term (current) drug therapy: Secondary | ICD-10-CM | POA: Diagnosis not present

## 2012-04-12 DIAGNOSIS — M069 Rheumatoid arthritis, unspecified: Secondary | ICD-10-CM | POA: Diagnosis not present

## 2012-04-12 DIAGNOSIS — Z09 Encounter for follow-up examination after completed treatment for conditions other than malignant neoplasm: Secondary | ICD-10-CM | POA: Diagnosis not present

## 2012-04-15 DIAGNOSIS — Z09 Encounter for follow-up examination after completed treatment for conditions other than malignant neoplasm: Secondary | ICD-10-CM | POA: Diagnosis not present

## 2012-04-15 DIAGNOSIS — N6489 Other specified disorders of breast: Secondary | ICD-10-CM | POA: Diagnosis not present

## 2012-04-16 ENCOUNTER — Encounter: Payer: Self-pay | Admitting: Family Medicine

## 2012-04-22 ENCOUNTER — Telehealth: Payer: Self-pay | Admitting: *Deleted

## 2012-04-22 NOTE — Telephone Encounter (Signed)
MMG report received from [facility] and sent to your desk?  Ok to remove from hold?

## 2012-04-26 ENCOUNTER — Encounter: Payer: Self-pay | Admitting: *Deleted

## 2012-04-26 NOTE — Telephone Encounter (Signed)
I believe this is already sent back to [place].

## 2012-05-20 DIAGNOSIS — D239 Other benign neoplasm of skin, unspecified: Secondary | ICD-10-CM | POA: Diagnosis not present

## 2012-05-20 DIAGNOSIS — D230 Other benign neoplasm of skin of lip: Secondary | ICD-10-CM | POA: Diagnosis not present

## 2012-06-02 DIAGNOSIS — Z79899 Other long term (current) drug therapy: Secondary | ICD-10-CM | POA: Diagnosis not present

## 2012-06-30 DIAGNOSIS — H2510 Age-related nuclear cataract, unspecified eye: Secondary | ICD-10-CM | POA: Diagnosis not present

## 2012-06-30 DIAGNOSIS — Z79899 Other long term (current) drug therapy: Secondary | ICD-10-CM | POA: Diagnosis not present

## 2012-06-30 DIAGNOSIS — H43819 Vitreous degeneration, unspecified eye: Secondary | ICD-10-CM | POA: Diagnosis not present

## 2012-07-30 ENCOUNTER — Encounter: Payer: Self-pay | Admitting: *Deleted

## 2012-08-02 DIAGNOSIS — L821 Other seborrheic keratosis: Secondary | ICD-10-CM | POA: Diagnosis not present

## 2012-08-02 DIAGNOSIS — D2370 Other benign neoplasm of skin of unspecified lower limb, including hip: Secondary | ICD-10-CM | POA: Diagnosis not present

## 2012-08-02 DIAGNOSIS — D1801 Hemangioma of skin and subcutaneous tissue: Secondary | ICD-10-CM | POA: Diagnosis not present

## 2012-08-02 DIAGNOSIS — Z85820 Personal history of malignant melanoma of skin: Secondary | ICD-10-CM | POA: Diagnosis not present

## 2012-08-03 ENCOUNTER — Ambulatory Visit (INDEPENDENT_AMBULATORY_CARE_PROVIDER_SITE_OTHER): Payer: Medicare Other | Admitting: Nurse Practitioner

## 2012-08-03 ENCOUNTER — Encounter: Payer: Self-pay | Admitting: Nurse Practitioner

## 2012-08-03 VITALS — BP 116/60 | HR 72 | Resp 12 | Ht 65.5 in | Wt 171.2 lb

## 2012-08-03 DIAGNOSIS — Z124 Encounter for screening for malignant neoplasm of cervix: Secondary | ICD-10-CM | POA: Diagnosis not present

## 2012-08-03 DIAGNOSIS — Z01419 Encounter for gynecological examination (general) (routine) without abnormal findings: Secondary | ICD-10-CM

## 2012-08-03 DIAGNOSIS — N952 Postmenopausal atrophic vaginitis: Secondary | ICD-10-CM

## 2012-08-03 DIAGNOSIS — Z0000 Encounter for general adult medical examination without abnormal findings: Secondary | ICD-10-CM

## 2012-08-03 LAB — POCT URINALYSIS DIPSTICK
Leukocytes, UA: NEGATIVE
Spec Grav, UA: 1.015

## 2012-08-03 MED ORDER — ESTRADIOL 10 MCG VA TABS: 10.0000 ug | ORAL_TABLET | VAGINAL | Status: DC

## 2012-08-03 NOTE — Progress Notes (Signed)
67 y.o. G3P3 Married Caucasian Fe here for annual exam.  Feels well on ERT but OK to try tapering off. No vaso symptoms.  No LMP recorded. Patient has had a hysterectomy.          Sexually active: yes  The current method of family planning is post menopausal status.    Exercising: no  The patient does not participate in regular exercise at present. Smoker:  no  Health Maintenance: Pap:   MMG:  10/15/2011 & 04/15/12 and repeat 10/14 w/ diagnostic bilateral Colonoscopy:  09/2002 negative repeat in 10 years BMD: 08/2009  T Score: spine -1.9; left femoral neck -3.0; total -1.6  repeated last year at Ortho - ? Results but improved TDaP:  02/2003 Labs: PCP maintains all labs.    reports that she has never smoked. She does not have any smokeless tobacco history on file. She reports that she does not drink alcohol or use illicit drugs.  Past Medical History  Diagnosis Date  . Osteoporosis   . Menopausal symptoms   . Postmenopausal HRT (hormone replacement therapy)   . RA (rheumatoid arthritis)   . Vitamin D deficiency   . Insomnia   . GERD (gastroesophageal reflux disease)   . Asthma   . Fibroid   . Melanoma 01/2012    on back of right arm     Past Surgical History  Procedure Laterality Date  . Breast surgery  1991    breast biopsy  . Abdominal hysterectomy  1995    partial  . Vaginal delivery      x3  . Breast biopsy      benign  . Cystocele repair  10/2007    with graft    Current Outpatient Prescriptions  Medication Sig Dispense Refill  . albuterol (PROAIR HFA) 108 (90 BASE) MCG/ACT inhaler Inhale 2 puffs into the lungs every 4 (four) hours as needed.  1 Inhaler  3  . aspirin 81 MG tablet Take 81 mg by mouth daily.        . Calcium Citrate-Vitamin D (CITRACAL/VITAMIN D PO) Take 1 tablet by mouth daily.        . Cholecalciferol (VITAMIN D3) 2000 UNITS TABS Take 1 tablet by mouth daily.        . Estradiol (VAGIFEM) 10 MCG TABS Insert 1 tab vaginally twice per week as directed.        Marland Kitchen estradiol (VIVELLE-DOT) 0.025 MG/24HR Place 1 patch onto the skin 2 (two) times a week.        . etanercept (ENBREL) 50 MG/ML injection Inject 50 mg into the skin once a week.        . fluticasone (FLONASE) 50 MCG/ACT nasal spray Place 2 sprays into the nose daily as needed.       . Folic Acid-Vit B6-Vit B12 (FOLGARD RX) 2.2-25-1 MG TABS Take 1 tablet by mouth daily.        . methotrexate 25 MG/ML SOLN Inject 1 ml under the skin every week       . Multiple Vitamin (MULTIVITAMIN) capsule Take 1 capsule by mouth daily.        . Omega-3 Fatty Acids (FISH OIL PO) Take 1 capsule by mouth daily.      . pilocarpine (SALAGEN) 5 MG tablet Take 5 mg by mouth 3 (three) times daily.       . RABEprazole (ACIPHEX) 20 MG tablet Take 20 mg by mouth daily.      Marland Kitchen zolpidem (AMBIEN) 5 MG  tablet Take 5 mg by mouth at bedtime as needed.         No current facility-administered medications for this visit.    History reviewed. No pertinent family history.  ROS:  Pertinent items are noted in HPI.  Otherwise, a comprehensive ROS was negative.  Exam:   BP 116/60  Pulse 72  Resp 12  Ht 5' 5.5" (1.664 m)  Wt 171 lb 3.2 oz (77.656 kg)  BMI 28.05 kg/m2 Height: 5' 5.5" (166.4 cm)  Ht Readings from Last 3 Encounters:  08/03/12 5' 5.5" (1.664 m)  02/09/12 5\' 6"  (1.676 m)  07/28/11 5\' 6"  (1.676 m)    General appearance: alert, cooperative and appears stated age Head: Normocephalic, without obvious abnormality, atraumatic Neck: no adenopathy, supple, symmetrical, trachea midline and thyroid normal to inspection and palpation Lungs: clear to auscultation bilaterally Breasts: normal appearance, no masses or tenderness Heart: regular rate and rhythm Abdomen: soft, non-tender; no masses,  no organomegaly Extremities: extremities normal, atraumatic, no cyanosis or edema Skin: Skin color, texture, turgor normal. No rashes or lesions Lymph nodes: Cervical, supraclavicular, and axillary nodes normal. No  abnormal inguinal nodes palpated Neurologic: Grossly normal   Pelvic: External genitalia:  no lesions              Urethra:  normal appearing urethra with no masses, tenderness or lesions              Bartholin's and Skene's: normal                 Vagina: normal appearing vagina with normal color and discharge, no lesions, graft can be felt.              Cervix: absent              Pap taken: no Bimanual Exam:  Uterus:  uterus absent              Adnexa: no mass, fullness, tenderness               Rectovaginal: Confirms               Anus:  normal sphincter tone, no lesions  A:  Well Woman with normal exam  postmenopausal on ERT 01/1993  S/P  TAH, AP repair secondary to fibroids 01/1993  S/P cystocele repair with graft 10/09   RA on Methotrexate - stable  Vit D deficiency with osteoporosis  P:   Pap smear as per guidelines   After discussion about continuing ERT, patient is willing to taper off Vivelle dot 0.025 mg and then stop.  If her symptoms are very bad she is to call back.    She will continue of Vagifem for now - refill for 1 year  Mammogram due 10/2012  counseled on breast self exam, use and side effects of HRT including DVT, CVA, cancer, etc., osteoporosis, adequate intake of calcium and vitamin D, diet and exercise, Kegel's exercises return annually or prn  An After Visit Summary was printed and given to the patient.

## 2012-08-03 NOTE — Patient Instructions (Signed)

## 2012-08-04 NOTE — Progress Notes (Signed)
Encounter reviewed by Dr. Brook Silva.  

## 2012-08-10 DIAGNOSIS — Z111 Encounter for screening for respiratory tuberculosis: Secondary | ICD-10-CM | POA: Diagnosis not present

## 2012-08-11 DIAGNOSIS — Z79899 Other long term (current) drug therapy: Secondary | ICD-10-CM | POA: Diagnosis not present

## 2012-08-30 DIAGNOSIS — Z09 Encounter for follow-up examination after completed treatment for conditions other than malignant neoplasm: Secondary | ICD-10-CM | POA: Diagnosis not present

## 2012-08-30 DIAGNOSIS — M25549 Pain in joints of unspecified hand: Secondary | ICD-10-CM | POA: Diagnosis not present

## 2012-08-30 DIAGNOSIS — M25569 Pain in unspecified knee: Secondary | ICD-10-CM | POA: Diagnosis not present

## 2012-08-30 DIAGNOSIS — M069 Rheumatoid arthritis, unspecified: Secondary | ICD-10-CM | POA: Diagnosis not present

## 2012-09-10 ENCOUNTER — Other Ambulatory Visit: Payer: Self-pay

## 2012-09-10 ENCOUNTER — Emergency Department (HOSPITAL_COMMUNITY): Payer: Medicare Other

## 2012-09-10 ENCOUNTER — Telehealth: Payer: Self-pay | Admitting: Family Medicine

## 2012-09-10 ENCOUNTER — Emergency Department (HOSPITAL_COMMUNITY)
Admission: EM | Admit: 2012-09-10 | Discharge: 2012-09-10 | Disposition: A | Discharge status: Home / Self Care | Payer: Medicare Other | Attending: Emergency Medicine | Admitting: Emergency Medicine

## 2012-09-10 ENCOUNTER — Encounter (HOSPITAL_COMMUNITY): Payer: Self-pay

## 2012-09-10 DIAGNOSIS — M810 Age-related osteoporosis without current pathological fracture: Secondary | ICD-10-CM | POA: Diagnosis not present

## 2012-09-10 DIAGNOSIS — R0609 Other forms of dyspnea: Secondary | ICD-10-CM | POA: Insufficient documentation

## 2012-09-10 DIAGNOSIS — R911 Solitary pulmonary nodule: Secondary | ICD-10-CM | POA: Diagnosis not present

## 2012-09-10 DIAGNOSIS — Z79890 Hormone replacement therapy (postmenopausal): Secondary | ICD-10-CM | POA: Insufficient documentation

## 2012-09-10 DIAGNOSIS — E559 Vitamin D deficiency, unspecified: Secondary | ICD-10-CM | POA: Diagnosis not present

## 2012-09-10 DIAGNOSIS — K219 Gastro-esophageal reflux disease without esophagitis: Secondary | ICD-10-CM | POA: Insufficient documentation

## 2012-09-10 DIAGNOSIS — R0602 Shortness of breath: Secondary | ICD-10-CM | POA: Insufficient documentation

## 2012-09-10 DIAGNOSIS — M069 Rheumatoid arthritis, unspecified: Secondary | ICD-10-CM | POA: Insufficient documentation

## 2012-09-10 DIAGNOSIS — Z8742 Personal history of other diseases of the female genital tract: Secondary | ICD-10-CM | POA: Insufficient documentation

## 2012-09-10 DIAGNOSIS — N951 Menopausal and female climacteric states: Secondary | ICD-10-CM | POA: Diagnosis not present

## 2012-09-10 DIAGNOSIS — G4730 Sleep apnea, unspecified: Secondary | ICD-10-CM | POA: Insufficient documentation

## 2012-09-10 DIAGNOSIS — J40 Bronchitis, not specified as acute or chronic: Secondary | ICD-10-CM | POA: Diagnosis not present

## 2012-09-10 DIAGNOSIS — Z7982 Long term (current) use of aspirin: Secondary | ICD-10-CM | POA: Diagnosis not present

## 2012-09-10 DIAGNOSIS — J45909 Unspecified asthma, uncomplicated: Secondary | ICD-10-CM | POA: Insufficient documentation

## 2012-09-10 DIAGNOSIS — G4700 Insomnia, unspecified: Secondary | ICD-10-CM | POA: Insufficient documentation

## 2012-09-10 DIAGNOSIS — R002 Palpitations: Secondary | ICD-10-CM

## 2012-09-10 DIAGNOSIS — Z79899 Other long term (current) drug therapy: Secondary | ICD-10-CM | POA: Insufficient documentation

## 2012-09-10 DIAGNOSIS — Z882 Allergy status to sulfonamides status: Secondary | ICD-10-CM | POA: Diagnosis not present

## 2012-09-10 DIAGNOSIS — R0989 Other specified symptoms and signs involving the circulatory and respiratory systems: Secondary | ICD-10-CM | POA: Insufficient documentation

## 2012-09-10 DIAGNOSIS — Z888 Allergy status to other drugs, medicaments and biological substances status: Secondary | ICD-10-CM | POA: Insufficient documentation

## 2012-09-10 LAB — CBC
HCT: 44.4 % (ref 36.0–46.0)
Hemoglobin: 15.6 g/dL — ABNORMAL HIGH (ref 12.0–15.0)
MCV: 93.7 fL (ref 78.0–100.0)
RDW: 14.6 % (ref 11.5–15.5)
WBC: 6.2 10*3/uL (ref 4.0–10.5)

## 2012-09-10 LAB — BASIC METABOLIC PANEL
BUN: 13 mg/dL (ref 6–23)
Chloride: 104 mEq/L (ref 96–112)
Creatinine, Ser: 0.62 mg/dL (ref 0.50–1.10)
GFR calc Af Amer: >90 mL/min (ref >90)
Glucose, Bld: 109 mg/dL — ABNORMAL HIGH (ref 70–99)

## 2012-09-10 NOTE — ED Notes (Signed)
Pt. Has a hx of heart racing , Pt. Was instructed to stop Caffeine and pt. Did.  But recently she has had some mild caffeine beverages and she stopped using Estrogen approximately 6 weeks ago.  Heart racing has been going on for approximately 8 months ago with exertion.  Recently the episodes have been coming at rest and more frequently.   Denies any chest pain .Marland Kitchen  Pt.is sob and also becomes very hot, (Has hot flashes)  Denies any n/v

## 2012-09-10 NOTE — ED Provider Notes (Signed)
CSN: [id number]     Arrival date & time 09/10/12  1651 History   First MD Initiated Contact with Patient 09/10/12 1829     Chief Complaint  Patient presents with  . Irregular Heart Beat   (Consider location/radiation/quality/duration/timing/severity/associated sxs/prior Treatment) HPI Patient complains of racing heartbeat and dyspnea onset 8 months ago, worse with exertion improved with rest. She denies any chest pain. She presents today as she is leaving town within the next few days to visit her daughter. She is presently asymptomatic. No treatment prior to coming here. Associated symptoms include hot flashes over the past 4 weeks since she stopped estrogen No other associated symptoms Past Medical History  Diagnosis Date  . Osteoporosis   . Menopausal symptoms   . Postmenopausal HRT (hormone replacement therapy)   . RA (rheumatoid arthritis) 11/2004    methotrexate and Embrel  . Vitamin D deficiency   . Insomnia   . GERD (gastroesophageal reflux disease)   . Asthma   . Fibroid   . Sleep apnea 2008    C-Pap   Past Surgical History  Procedure Laterality Date  . Vaginal delivery      x3  . Cystocele repair  10/2007    with graft  . Abdominal hysterectomy  01/1993    partial with AP repair, secondary to fibroids  . Breast surgery  1991    breast biopsy  . Melanoma excision Right 01/2012    excision right posterior arm   Family History  Problem Relation Age of Onset  . Breast cancer Sister 10   History  Substance Use Topics  . Smoking status: Never Smoker   . Smokeless tobacco: Never Used  . Alcohol Use: No   OB History   Grav Para Term Preterm Abortions TAB SAB Ect Mult Living   3 3        3      Review of Systems  Constitutional: Negative.   HENT: Negative.   Respiratory: Positive for shortness of breath.   Cardiovascular: Positive for palpitations.  Gastrointestinal: Negative.   Musculoskeletal: Negative.   Skin: Negative.   Neurological: Negative.    Psychiatric/Behavioral: Negative.   All other systems reviewed and are negative.    Allergies  Adalimumab; Diclofenac; Fosamax; Methotrexate derivatives; Piroxicam; Sulfamethoxazole w-trimethoprim; and Tramadol hcl  Home Medications   Current Outpatient Rx  Name  Route  Sig  Dispense  Refill  . albuterol (PROAIR HFA) 108 (90 BASE) MCG/ACT inhaler   Inhalation   Inhale 2 puffs into the lungs every 4 (four) hours as needed.   1 Inhaler   3   . aspirin 81 MG tablet   Oral   Take 81 mg by mouth daily.           . Calcium Citrate-Vitamin D (CITRACAL/VITAMIN D PO)   Oral   Take 1 tablet by mouth daily.           . Cholecalciferol (VITAMIN D3) 2000 UNITS TABS   Oral   Take 1 tablet by mouth daily.           . Estradiol (VAGIFEM) 10 MCG TABS vaginal tablet   Vaginal   Place 1 tablet (10 mcg total) vaginally 2 (two) times a week. Insert 1 tab vaginally twice per week as directed.   24 tablet   3   . etanercept (ENBREL) 50 MG/ML injection   Subcutaneous   Inject 50 mg into the skin once a week.          Marland Kitchen  fluticasone (FLONASE) 50 MCG/ACT nasal spray   Nasal   Place 2 sprays into the nose daily as needed.          . Folic Acid-Vit B6-Vit B12 (FOLGARD RX) 2.2-25-1 MG TABS   Oral   Take 1 tablet by mouth daily.           . methotrexate 25 MG/ML SOLN      Inject 1 ml under the skin every week          . Multiple Vitamin (MULTIVITAMIN) capsule   Oral   Take 1 capsule by mouth daily.           . Omega-3 Fatty Acids (FISH OIL PO)   Oral   Take 1 capsule by mouth daily.         . pilocarpine (SALAGEN) 5 MG tablet   Oral   Take 5 mg by mouth 3 (three) times daily.          . RABEprazole (ACIPHEX) 20 MG tablet   Oral   Take 20 mg by mouth daily.         Marland Kitchen zolpidem (AMBIEN) 5 MG tablet   Oral   Take 5 mg by mouth at bedtime as needed.            BP 126/72  Pulse 65  Temp(Src) 98.1 F (36.7 C) (Oral)  Resp 11  Wt 170 lb (77.111 kg)   BMI 27.85 kg/m2  SpO2 95% Physical Exam  Nursing note and vitals reviewed. Constitutional: She appears well-developed and well-nourished.  HENT:  Head: Normocephalic and atraumatic.  Eyes: Conjunctivae are normal. Pupils are equal, round, and reactive to light.  Neck: Neck supple. No tracheal deviation present. No thyromegaly present.  Cardiovascular: Normal rate and regular rhythm.   No murmur heard. Pulmonary/Chest: Effort normal and breath sounds normal.  Abdominal: Soft. Bowel sounds are normal. She exhibits no distension. There is no tenderness.  Musculoskeletal: Normal range of motion. She exhibits no edema and no tenderness.  Neurological: She is alert. Coordination normal.  Skin: Skin is warm and dry. No rash noted.  Psychiatric: She has a normal mood and affect.    ED Course  Procedures (including critical care time) Labs Review Labs Reviewed  CBC - Abnormal; Notable for the following:    Hemoglobin 15.6 (*)    All other components within normal limits  BASIC METABOLIC PANEL - Abnormal; Notable for the following:    Glucose, Bld 109 (*)    All other components within normal limits  POCT I-STAT TROPONIN I   Date: 09/10/2012  Rate: 75  Rhythm: normal sinus rhythm  QRS Axis: left  Intervals: normal  ST/T Wave abnormalities: nonspecific T wave changes  Conduction Disutrbances:none  Narrative Interpretation: poor r wave progression  Old EKG Reviewed: Poor R wave progression is new over 10/19/2007 otherwise no significant change interpreted by me  Imaging Review No results found. Chest xray viewed by me Results for orders placed during the hospital encounter of 09/10/12  CBC      Result Value Range   WBC 6.2  4.0 - 10.5 K/uL   RBC 4.74  3.87 - 5.11 MIL/uL   Hemoglobin 15.6 (*) 12.0 - 15.0 g/dL   HCT 16.1  09.6 - 04.5 %   MCV 93.7  78.0 - 100.0 fL   MCH 32.9  26.0 - 34.0 pg   MCHC 35.1  30.0 - 36.0 g/dL   RDW 40.9  81.1 - 91.4 %  Platelets 263  150 - 400 K/uL   BASIC METABOLIC PANEL      Result Value Range   Sodium 139  135 - 145 mEq/L   Potassium 4.0  3.5 - 5.1 mEq/L   Chloride 104  96 - 112 mEq/L   CO2 27  19 - 32 mEq/L   Glucose, Bld 109 (*) 70 - 99 mg/dL   BUN 13  6 - 23 mg/dL   Creatinine, Ser 1.61  0.50 - 1.10 mg/dL   Calcium 9.4  8.4 - 09.6 mg/dL   GFR calc non Af Amer >90  >90 mL/min   GFR calc Af Amer >90  >90 mL/min  POCT I-STAT TROPONIN I      Result Value Range   Troponin i, poc 0.03  0.00 - 0.08 ng/mL   Comment 3            Dg Chest 2 View  09/10/2012   *RADIOLOGY REPORT*  Clinical Data:  Chest pain, shortness of breath, irregular heart beat, history rheumatoid arthritis, asthma, sleep apnea  CHEST - 2 VIEW  Comparison: 08/29/2004  Findings: Normal heart size and pulmonary vascularity. Calcified tortuous thoracic aorta. Bronchitic changes without infiltrate, pleural effusion or pneumothorax. New 7 mm right upper lobe nodular density question developing right upper lobe nodule/tumor. Bones demineralized.  IMPRESSION: Bronchitic changes. New 7 mm right upper lobe nodular density question developing right upper lobe nodule/tumor; CT chest recommended for further evaluation, with contrast if the patient's renal function permits.   Original Report Authenticated By: Ulyses Southward, M.D.    8:15 PM patient remains asymptomatic  MDM  No diagnosis found.  I offered to order CT scan of chest in ED to evaluate lung nodule. Patient declined she prefers to followup with Dr.Tower for lung nodule seen on today's visit Spoke with Dr.Kohan [facility] [place] patient as outpatient. Symptoms are chronic. Dyspnea palpitations may be anginal equivalent however his chronic DX #1 palpitations #2 dyspnea #3 lung nodule   Doug Sou, MD 09/10/12 2028

## 2012-09-10 NOTE — Telephone Encounter (Signed)
Patient Information:  Caller Name: [name]  Phone: [phone]  Patient: Deanna Norris  Gender: Female  DOB: February 14, 1945  Age: 67 Years  PCP: Tower, [employer] [facility])  Office Follow Up:  Does the office need to follow up with this patient?: No  Instructions For The Office: N/A  RN Note:  Patient seen in office for palpitations a couple of months ago, and told to cut out caffeine and estrogen.  States this did help, but now have returned over past few weeks.  Has been short of breath coming up steps with palpitations.  Per heartbeat questions protocol, advised ED due to new SOB on exertion; patient agrees to go to [facility] ED.  krs/can  Symptoms  Reason For Call & Symptoms: palpitations  Reviewed Health History In EMR: Yes  Reviewed Medications In EMR: Yes  Reviewed Allergies In EMR: Yes  Reviewed Surgeries / Procedures: Yes  Date of Onset of Symptoms: 09/08/2012  Guideline(s) Used:  Heart Rate and Heartbeat Questions  Disposition Per Guideline:   Go to ED Now (or to Office with PCP Approval)  Reason For Disposition Reached:   New or worsened shortness of breath with activity (dyspnea on exertion)  Advice Given:  N/A  Patient Will Follow Care Advice:  YES

## 2012-09-10 NOTE — ED Notes (Signed)
Pt returned from X-ray.  

## 2012-09-14 ENCOUNTER — Encounter: Payer: Self-pay | Admitting: Family Medicine

## 2012-09-14 ENCOUNTER — Ambulatory Visit (INDEPENDENT_AMBULATORY_CARE_PROVIDER_SITE_OTHER): Payer: Medicare Other | Admitting: Family Medicine

## 2012-09-14 VITALS — BP 132/76 | HR 73 | Temp 97.6°F | Ht 65.5 in | Wt 169.5 lb

## 2012-09-14 DIAGNOSIS — R911 Solitary pulmonary nodule: Secondary | ICD-10-CM

## 2012-09-14 DIAGNOSIS — R0600 Dyspnea, unspecified: Secondary | ICD-10-CM | POA: Insufficient documentation

## 2012-09-14 DIAGNOSIS — R0989 Other specified symptoms and signs involving the circulatory and respiratory systems: Secondary | ICD-10-CM

## 2012-09-14 DIAGNOSIS — R002 Palpitations: Secondary | ICD-10-CM | POA: Diagnosis not present

## 2012-09-14 DIAGNOSIS — R0609 Other forms of dyspnea: Secondary | ICD-10-CM | POA: Diagnosis not present

## 2012-09-14 NOTE — Assessment & Plan Note (Signed)
Incidentally seen on cxr in ER  Rev ER notes and studies in detail  CT of chest with contrast planned She is not high risk for lung cancer  Does have hx of autoimmune dz Regular ppds are all neg

## 2012-09-14 NOTE — Patient Instructions (Addendum)
Follow up with Dr Katrinka Blazing with cardiology as planned We will do CT referral when you check out  If symptoms acutely worsen please let me know or go back to there ER If you feel you would like a referral to a counselor for stress at any time also let me know

## 2012-09-14 NOTE — Progress Notes (Signed)
Subjective:    Patient ID: Deanna Norris, female    DOB: [birth year], 67 y.o.   MRN: [id number]  HPI Here for f/u of ER visit 09/10/12  Went in with palpitations and SOB Work up was essentially nl for that  EKG rate 75 with stable non spec T wave changes and poor R wave prog Nl cbc and chem Was inst to see cardiol  Feels about the same  Her palpitations seem to be more active when she is more active physically  Seldom at rest  Started to get short of breath also - usually on exertion or when she is in a hot shower She does not feel like this is asthma at all   Never had a pos PPD   She knows that some of the rheumatoid meds may have that side eff   CXR showed a new 7mm RUL nodule and CT with contrast was recommended   Chemistry      Component Value Date/Time   NA 139 09/10/2012 1725   K 4.0 09/10/2012 1725   CL 104 09/10/2012 1725   CO2 27 09/10/2012 1725   BUN 13 09/10/2012 1725   CREATININE 0.62 09/10/2012 1725      Component Value Date/Time   CALCIUM 9.4 09/10/2012 1725   ALKPHOS 63 02/02/2012 0746   AST 26 02/02/2012 0746   ALT 28 02/02/2012 0746   BILITOT 0.6 02/02/2012 0746      Does not think she is very physically deconditioned No exercise at all this summer  Used to exercise a lot until this summer   She also came off HRT- sister has breast cancer   She will be scheduled    Patient Active Problem List   Diagnosis Date Noted  . Palpitations 09/14/2012  . Dyspnea 09/14/2012  . Nodule of right lung 09/14/2012  . 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 HYPERLIPIDEMIA 05/08/2009  . 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 ASTHMA 05/08/2009  . GERD 05/08/2009  . MENOPAUSAL DISORDER 05/08/2009  . ARTHRITIS, RHEUMATOID 05/08/2009  . OSTEOPOROSIS 05/08/2009  . SPONDYLOLISTHESIS 05/08/2009  . SLEEP APNEA 05/08/2009   Past Medical History  Diagnosis Date  . Osteoporosis   . Menopausal symptoms   . Postmenopausal HRT (hormone replacement therapy)   . RA (rheumatoid arthritis) 11/2004     methotrexate and Embrel  . Vitamin D deficiency   . Insomnia   . GERD (gastroesophageal reflux disease)   . Asthma   . Fibroid   . Sleep apnea 2008    C-Pap   Past Surgical History  Procedure Laterality Date  . Vaginal delivery      x3  . Cystocele repair  10/2007    with graft  . Abdominal hysterectomy  01/1993    partial with AP repair, secondary to fibroids  . Breast surgery  1991    breast biopsy  . Melanoma excision Right 01/2012    excision right posterior arm   History  Substance Use Topics  . Smoking status: Never Smoker   . Smokeless tobacco: Never Used  . Alcohol Use: No   Family History  Problem Relation Age of Onset  . Breast cancer Sister 70   Allergies  Allergen Reactions  . Adalimumab     REACTION: frequent sinus infection  . Diclofenac     unk  . Fosamax [Alendronate Sodium]     Acid reflux  . Methotrexate Derivatives Nausea Only    Oral metotrexate  . Piroxicam     unk  .  Sulfamethoxazole W-Trimethoprim     REACTION: face and hands rash and felt burning sensation  . Tramadol Hcl     REACTION: nausea   Current Outpatient Prescriptions on File Prior to Visit  Medication Sig Dispense Refill  . albuterol (PROAIR HFA) 108 (90 BASE) MCG/ACT inhaler Inhale 2 puffs into the lungs every 4 (four) hours as needed.  1 Inhaler  3  . aspirin 81 MG tablet Take 81 mg by mouth every other day.       . Calcium Citrate-Vitamin D (CITRACAL/VITAMIN D PO) Take 1 tablet by mouth daily.        . Cholecalciferol (VITAMIN D3) 2000 UNITS TABS Take 1 tablet by mouth daily.        Marland Kitchen etanercept (ENBREL) 50 MG/ML injection Inject 50 mg into the skin once a week.       . fluticasone (FLONASE) 50 MCG/ACT nasal spray Place 2 sprays into the nose daily as needed for rhinitis or allergies.       . Folic Acid-Vit B6-Vit B12 (FOLGARD RX) 2.2-25-1 MG TABS Take 1 tablet by mouth daily.        . methotrexate 25 MG/ML SOLN Inject 1 ml under the skin every week       . Multiple  Vitamin (MULTIVITAMIN) capsule Take 1 capsule by mouth daily.        . Omega-3 Fatty Acids (FISH OIL PO) Take 1 capsule by mouth daily.      . pilocarpine (SALAGEN) 5 MG tablet Take 5 mg by mouth 3 (three) times daily.       . RABEprazole (ACIPHEX) 20 MG tablet Take 20 mg by mouth daily.      Marland Kitchen zolpidem (AMBIEN) 5 MG tablet Take 2.5-5 mg by mouth at bedtime as needed for sleep.        No current facility-administered medications on file prior to visit.    Review of Systems Review of Systems  Constitutional: Negative for fever, appetite change, fatigue and unexpected weight change.  Eyes: Negative for pain and visual disturbance.  Respiratory: Negative for cough, wheezing and pos for sob on exertion Cardiovascular: Negative cp/ pnd/ orthopnea and pos for rapid HR/ palpitations and sob on exertion, neg for pedal edema  Gastrointestinal: Negative for nausea, diarrhea and constipation. neg for abd pain or heartburn  Genitourinary: Negative for urgency and frequency.  Skin: Negative for pallor or rash   Neurological: Negative for weakness, light-headedness, numbness and headaches.  Hematological: Negative for adenopathy. Does not bruise/bleed easily.  Psychiatric/Behavioral: Negative for dysphoric mood. The patient is not nervous/anxious.  pos for many stressors (with good support)       Objective:   Physical Exam  Constitutional: She appears well-developed and well-nourished. No distress.  HENT:  Head: Normocephalic and atraumatic.  Mouth/Throat: Oropharynx is clear and moist.  Eyes: Conjunctivae and EOM are normal. Pupils are equal, round, and reactive to light. No scleral icterus.  Neck: Normal range of motion. Neck supple. No JVD present. Carotid bruit is not present. No thyromegaly present.  Cardiovascular: Normal rate, regular rhythm, normal heart sounds and intact distal pulses.  Exam reveals no gallop.   Pulmonary/Chest: Breath sounds normal. No respiratory distress. She has no  wheezes. She has no rales. She exhibits no tenderness.  Abdominal: Soft. Bowel sounds are normal. She exhibits no distension, no abdominal bruit and no mass. There is no tenderness.  Musculoskeletal: She exhibits no edema and no tenderness.  Lymphadenopathy:    She has no cervical  adenopathy.  Neurological: She is alert. She has normal reflexes. No cranial nerve deficit. She exhibits normal muscle tone. Coordination normal.  Skin: Skin is warm and dry. No rash noted. No erythema. No pallor.  Psychiatric: She has a normal mood and affect.  Seems slightly stressed and anxious but talkative and pleasant           Assessment & Plan:

## 2012-09-14 NOTE — Assessment & Plan Note (Signed)
Exertional and mild  With upcoming cardiac eval Reassuring exam Pt does admit to being deconditioned

## 2012-09-14 NOTE — Assessment & Plan Note (Signed)
Pt experiences rapid heartbeat- more than she would expect with exertion (does not regularly exercise), without cp but with mild sob  Rev last EKG - has some non specific T wave changes and poor R wave progression  No imp with cessation of caffeine  Some stressors She will see Dr Katrinka Blazing (cardiology) tomorrow for further eval

## 2012-09-15 ENCOUNTER — Ambulatory Visit (INDEPENDENT_AMBULATORY_CARE_PROVIDER_SITE_OTHER)
Admission: RE | Admit: 2012-09-15 | Discharge: 2012-09-15 | Disposition: A | Discharge status: Home / Self Care | Payer: Medicare Other | Source: Ambulatory Visit | Attending: Family Medicine | Admitting: Family Medicine

## 2012-09-15 DIAGNOSIS — G4730 Sleep apnea, unspecified: Secondary | ICD-10-CM | POA: Diagnosis not present

## 2012-09-15 DIAGNOSIS — R911 Solitary pulmonary nodule: Secondary | ICD-10-CM | POA: Diagnosis not present

## 2012-09-15 DIAGNOSIS — J984 Other disorders of lung: Secondary | ICD-10-CM | POA: Diagnosis not present

## 2012-09-15 DIAGNOSIS — M069 Rheumatoid arthritis, unspecified: Secondary | ICD-10-CM | POA: Diagnosis not present

## 2012-09-15 DIAGNOSIS — I4949 Other premature depolarization: Secondary | ICD-10-CM | POA: Diagnosis not present

## 2012-09-15 MED ORDER — IOHEXOL 300 MG/ML  SOLN
80.0000 mL | Freq: Once | INTRAMUSCULAR | Status: AC | PRN
  Administered 2012-09-15: 80 mL via INTRAVENOUS

## 2012-09-23 ENCOUNTER — Encounter: Payer: Self-pay | Admitting: Rheumatology

## 2012-10-05 DIAGNOSIS — M069 Rheumatoid arthritis, unspecified: Secondary | ICD-10-CM | POA: Diagnosis not present

## 2012-10-05 DIAGNOSIS — R9431 Abnormal electrocardiogram [ECG] [EKG]: Secondary | ICD-10-CM | POA: Diagnosis not present

## 2012-10-05 DIAGNOSIS — G4730 Sleep apnea, unspecified: Secondary | ICD-10-CM | POA: Diagnosis not present

## 2012-10-05 DIAGNOSIS — I4949 Other premature depolarization: Secondary | ICD-10-CM | POA: Diagnosis not present

## 2012-10-06 DIAGNOSIS — Z23 Encounter for immunization: Secondary | ICD-10-CM | POA: Diagnosis not present

## 2012-10-06 DIAGNOSIS — N816 Rectocele: Secondary | ICD-10-CM | POA: Diagnosis not present

## 2012-10-25 DIAGNOSIS — R928 Other abnormal and inconclusive findings on diagnostic imaging of breast: Secondary | ICD-10-CM | POA: Diagnosis not present

## 2012-10-26 ENCOUNTER — Encounter: Payer: Self-pay | Admitting: Family Medicine

## 2012-10-27 ENCOUNTER — Encounter: Payer: Self-pay | Admitting: *Deleted

## 2012-11-03 ENCOUNTER — Encounter (HOSPITAL_COMMUNITY): Payer: Self-pay | Admitting: Interventional Cardiology

## 2012-11-03 ENCOUNTER — Telehealth: Payer: Self-pay

## 2012-11-03 NOTE — Telephone Encounter (Signed)
pt husband aware of echo results.No significant abnormalities are noted on the echocardiogram. Heart pumping function is normal. pt husband verbalized understanding

## 2012-11-03 NOTE — Telephone Encounter (Signed)
Message copied by Jarvis Newcomer on Wed Nov 03, 2012  4:10 PM ------      Message from: Verdis Prime      Created: Wed Nov 03, 2012  2:43 PM       No significant abnormalities are noted on the echocardiogram. Heart pumping function is normal. ------

## 2012-11-08 DIAGNOSIS — H10529 Angular blepharoconjunctivitis, unspecified eye: Secondary | ICD-10-CM | POA: Diagnosis not present

## 2012-11-18 DIAGNOSIS — K5900 Constipation, unspecified: Secondary | ICD-10-CM | POA: Diagnosis not present

## 2012-11-18 DIAGNOSIS — K219 Gastro-esophageal reflux disease without esophagitis: Secondary | ICD-10-CM | POA: Diagnosis not present

## 2012-11-18 DIAGNOSIS — K625 Hemorrhage of anus and rectum: Secondary | ICD-10-CM | POA: Diagnosis not present

## 2012-11-18 DIAGNOSIS — R1319 Other dysphagia: Secondary | ICD-10-CM | POA: Diagnosis not present

## 2012-11-18 DIAGNOSIS — Z79899 Other long term (current) drug therapy: Secondary | ICD-10-CM | POA: Diagnosis not present

## 2012-11-18 DIAGNOSIS — Z1211 Encounter for screening for malignant neoplasm of colon: Secondary | ICD-10-CM | POA: Diagnosis not present

## 2012-12-22 ENCOUNTER — Ambulatory Visit: Payer: Medicare Other | Admitting: Family Medicine

## 2013-01-12 DIAGNOSIS — D131 Benign neoplasm of stomach: Secondary | ICD-10-CM | POA: Diagnosis not present

## 2013-01-12 DIAGNOSIS — R1310 Dysphagia, unspecified: Secondary | ICD-10-CM | POA: Diagnosis not present

## 2013-01-12 DIAGNOSIS — R1319 Other dysphagia: Secondary | ICD-10-CM | POA: Diagnosis not present

## 2013-01-12 DIAGNOSIS — Z1211 Encounter for screening for malignant neoplasm of colon: Secondary | ICD-10-CM | POA: Diagnosis not present

## 2013-01-12 DIAGNOSIS — K219 Gastro-esophageal reflux disease without esophagitis: Secondary | ICD-10-CM | POA: Diagnosis not present

## 2013-01-12 DIAGNOSIS — K625 Hemorrhage of anus and rectum: Secondary | ICD-10-CM | POA: Diagnosis not present

## 2013-01-21 DIAGNOSIS — L819 Disorder of pigmentation, unspecified: Secondary | ICD-10-CM | POA: Diagnosis not present

## 2013-01-21 DIAGNOSIS — D1801 Hemangioma of skin and subcutaneous tissue: Secondary | ICD-10-CM | POA: Diagnosis not present

## 2013-01-21 DIAGNOSIS — D239 Other benign neoplasm of skin, unspecified: Secondary | ICD-10-CM | POA: Diagnosis not present

## 2013-01-21 DIAGNOSIS — L821 Other seborrheic keratosis: Secondary | ICD-10-CM | POA: Diagnosis not present

## 2013-01-21 DIAGNOSIS — L219 Seborrheic dermatitis, unspecified: Secondary | ICD-10-CM | POA: Diagnosis not present

## 2013-01-21 DIAGNOSIS — Z85820 Personal history of malignant melanoma of skin: Secondary | ICD-10-CM | POA: Diagnosis not present

## 2013-01-27 DIAGNOSIS — K219 Gastro-esophageal reflux disease without esophagitis: Secondary | ICD-10-CM | POA: Diagnosis not present

## 2013-01-27 DIAGNOSIS — M069 Rheumatoid arthritis, unspecified: Secondary | ICD-10-CM | POA: Diagnosis not present

## 2013-01-27 DIAGNOSIS — M3500 Sicca syndrome, unspecified: Secondary | ICD-10-CM | POA: Diagnosis not present

## 2013-01-27 DIAGNOSIS — M25569 Pain in unspecified knee: Secondary | ICD-10-CM | POA: Diagnosis not present

## 2013-01-27 DIAGNOSIS — R1319 Other dysphagia: Secondary | ICD-10-CM | POA: Diagnosis not present

## 2013-01-27 DIAGNOSIS — Z09 Encounter for follow-up examination after completed treatment for conditions other than malignant neoplasm: Secondary | ICD-10-CM | POA: Diagnosis not present

## 2013-01-31 ENCOUNTER — Telehealth: Payer: Self-pay | Admitting: Family Medicine

## 2013-01-31 DIAGNOSIS — E785 Hyperlipidemia, unspecified: Secondary | ICD-10-CM

## 2013-01-31 DIAGNOSIS — M810 Age-related osteoporosis without current pathological fracture: Secondary | ICD-10-CM

## 2013-01-31 DIAGNOSIS — Z0000 Encounter for general adult medical examination without abnormal findings: Secondary | ICD-10-CM | POA: Insufficient documentation

## 2013-01-31 NOTE — Telephone Encounter (Signed)
Message copied by Abner Greenspan on Mon Jan 31, 2013  9:45 PM ------      Message from: Ellamae Sia      Created: Mon Jan 24, 2013 12:44 PM      Regarding: Lab orders for Tuesday, 1.27.15       Patient is scheduled for CPX labs, please order future labs, Thanks , Terri       ------

## 2013-02-02 ENCOUNTER — Other Ambulatory Visit (INDEPENDENT_AMBULATORY_CARE_PROVIDER_SITE_OTHER): Payer: Medicare Other

## 2013-02-02 DIAGNOSIS — Z0000 Encounter for general adult medical examination without abnormal findings: Secondary | ICD-10-CM

## 2013-02-02 DIAGNOSIS — M810 Age-related osteoporosis without current pathological fracture: Secondary | ICD-10-CM | POA: Diagnosis not present

## 2013-02-02 DIAGNOSIS — E785 Hyperlipidemia, unspecified: Secondary | ICD-10-CM | POA: Diagnosis not present

## 2013-02-02 LAB — CBC WITH DIFFERENTIAL/PLATELET
BASOS ABS: 0.1 10*3/uL (ref 0.0–0.1)
Basophils Relative: 1.1 % (ref 0.0–3.0)
Eosinophils Absolute: 0.2 10*3/uL (ref 0.0–0.7)
Eosinophils Relative: 4.2 % (ref 0.0–5.0)
HCT: 45.1 % (ref 36.0–46.0)
Hemoglobin: 14.8 g/dL (ref 12.0–15.0)
LYMPHS PCT: 39.1 % (ref 12.0–46.0)
Lymphs Abs: 2.3 10*3/uL (ref 0.7–4.0)
MCHC: 32.8 g/dL (ref 30.0–36.0)
MCV: 97.4 fl (ref 78.0–100.0)
MONOS PCT: 13.7 % — AB (ref 3.0–12.0)
Monocytes Absolute: 0.8 10*3/uL (ref 0.1–1.0)
Neutro Abs: 2.5 10*3/uL (ref 1.4–7.7)
Neutrophils Relative %: 41.9 % — ABNORMAL LOW (ref 43.0–77.0)
PLATELETS: 261 10*3/uL (ref 150.0–400.0)
RBC: 4.63 Mil/uL (ref 3.87–5.11)
RDW: 15.5 % — AB (ref 11.5–14.6)
WBC: 5.9 10*3/uL (ref 4.5–10.5)

## 2013-02-02 LAB — LIPID PANEL
Cholesterol: 227 mg/dL — ABNORMAL HIGH (ref 0–200)
HDL: 62.8 mg/dL (ref >39.00)
Total CHOL/HDL Ratio: 4
Triglycerides: 88 mg/dL (ref 0.0–149.0)
VLDL: 17.6 mg/dL (ref 0.0–40.0)

## 2013-02-02 LAB — COMPREHENSIVE METABOLIC PANEL
ALT: 24 U/L (ref 0–35)
AST: 21 U/L (ref 0–37)
Albumin: 3.7 g/dL (ref 3.5–5.2)
Alkaline Phosphatase: 83 U/L (ref 39–117)
BUN: 12 mg/dL (ref 6–23)
CALCIUM: 8.9 mg/dL (ref 8.4–10.5)
CHLORIDE: 107 meq/L (ref 96–112)
CO2: 27 meq/L (ref 19–32)
Creatinine, Ser: 0.7 mg/dL (ref 0.4–1.2)
GFR: 90.12 mL/min (ref >60.00)
Glucose, Bld: 91 mg/dL (ref 70–99)
POTASSIUM: 3.9 meq/L (ref 3.5–5.1)
SODIUM: 139 meq/L (ref 135–145)
TOTAL PROTEIN: 6.6 g/dL (ref 6.0–8.3)
Total Bilirubin: 0.6 mg/dL (ref 0.3–1.2)

## 2013-02-02 LAB — TSH: TSH: 0.69 u[IU]/mL (ref 0.35–5.50)

## 2013-02-02 LAB — LDL CHOLESTEROL, DIRECT: LDL DIRECT: 144.2 mg/dL

## 2013-02-03 LAB — VITAMIN D 25 HYDROXY (VIT D DEFICIENCY, FRACTURES): VIT D 25 HYDROXY: 52 ng/mL (ref 30–89)

## 2013-02-09 ENCOUNTER — Ambulatory Visit (INDEPENDENT_AMBULATORY_CARE_PROVIDER_SITE_OTHER): Payer: Medicare Other | Admitting: Family Medicine

## 2013-02-09 ENCOUNTER — Encounter: Payer: Self-pay | Admitting: Family Medicine

## 2013-02-09 VITALS — BP 116/76 | HR 60 | Temp 97.7°F | Ht 65.75 in | Wt 173.8 lb

## 2013-02-09 DIAGNOSIS — M810 Age-related osteoporosis without current pathological fracture: Secondary | ICD-10-CM

## 2013-02-09 DIAGNOSIS — Z0000 Encounter for general adult medical examination without abnormal findings: Secondary | ICD-10-CM | POA: Diagnosis not present

## 2013-02-09 DIAGNOSIS — E785 Hyperlipidemia, unspecified: Secondary | ICD-10-CM

## 2013-02-09 MED ORDER — FLUTICASONE PROPIONATE 50 MCG/ACT NA SUSP: 2.0000 | Freq: Every day | NASAL | Status: DC | PRN

## 2013-02-09 NOTE — Progress Notes (Signed)
Pre-visit discussion using our clinic review tool. No additional management support is needed unless otherwise documented below in the visit note.  

## 2013-02-09 NOTE — Progress Notes (Signed)
Subjective:    Patient ID: Deanna Norris, female    DOB: 03-Nov-1945, 68 y.o.   MRN: [id number]  HPI I have personally reviewed the Medicare Annual Wellness questionnaire and have noted 1. The patient's medical and social history 2. Their use of alcohol, tobacco or illicit drugs 3. Their current medications and supplements 4. The patient's functional ability including ADL's, fall risks, home safety risks and hearing or visual             impairment. 5. Diet and physical activities 6. Evidence for depression or mood disorders  The patients weight, height, BMI have been recorded in the chart and visual acuity is per eye clinic.  I have made referrals, counseling and provided education to the patient based review of the above and I have provided the pt with a written personalized care plan for preventive services.  Is doing well overall   See scanned forms.  Routine anticipatory guidance given to patient.  See health maintenance. Flu vaccine 10/14  Shingles-declines  PNA 6/12 vaccine  Tetanus 2/05  Vaccine - recommended  getting at the health dept  Colonoscopy - just had one with an endoscopy 2014 - Dr Loreta Ave (she was having choking issues) - it is still happening and she will disc with her Rheumatologist  Breast cancer screening-mammogram 10/14 with a one year f/u  Self exam-no lumps   Advance directive-she has a living will  Cognitive function addressed- see scanned forms- and if abnormal then additional documentation follows. No major memory problems    PMH and SH reviewed  Meds, vitals, and allergies reviewed.   ROS: See HPI.  Otherwise negative.    OP dexa 11/13 Followed by rheumatology No fragility fractures  D level is 52  Hyperlipidemia  Lab Results  Component Value Date   CHOL 227* 02/02/2013   CHOL 234* 02/02/2012   Lab Results  Component Value Date   HDL 62.80 02/02/2013   HDL 01.65 02/02/2012   No results found for this basename: [facility]   Lab  Results  Component Value Date   TRIG 88.0 02/02/2013   TRIG 122.0 02/02/2012   Lab Results  Component Value Date   CHOLHDL 4 02/02/2013   CHOLHDL 4 02/02/2012   Lab Results  Component Value Date   LDLDIRECT 144.2 02/02/2013   LDLDIRECT 137.3 02/02/2012   LDL is up a bit  She eats what she wants to eat in general - some healthy stuff and some not healthy stuff  Probably too much fat     Got note from [place]- her cpap is working well for her sleep apnea - that was a while ago - ? If she needs to be rechecked Her sleep is good/no problems/ no fatigue or other issues     Chemistry      Component Value Date/Time   NA 139 02/02/2013 0735   K 3.9 02/02/2013 0735   CL 107 02/02/2013 0735   CO2 27 02/02/2013 0735   BUN 12 02/02/2013 0735   CREATININE 0.7 02/02/2013 0735      Component Value Date/Time   CALCIUM 8.9 02/02/2013 0735   ALKPHOS 83 02/02/2013 0735   AST 21 02/02/2013 0735   ALT 24 02/02/2013 0735   BILITOT 0.6 02/02/2013 0735     blood glucose 99 Lab Results  Component Value Date   WBC 5.9 02/02/2013   HGB 14.8 02/02/2013   HCT 45.1 02/02/2013   MCV 97.4 02/02/2013   PLT 261.0 02/02/2013  Lab Results  Component Value Date   TSH 0.69 02/02/2013     Patient Active Problem List   Diagnosis Date Noted  . Encounter for Medicare annual wellness exam 01/31/2013  . Palpitations 09/14/2012  . Dyspnea 09/14/2012  . Nodule of right lung 09/14/2012  . 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 HYPERLIPIDEMIA 05/08/2009  . 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 ASTHMA 05/08/2009  . GERD 05/08/2009  . MENOPAUSAL DISORDER 05/08/2009  . ARTHRITIS, RHEUMATOID 05/08/2009  . OSTEOPOROSIS 05/08/2009  . SPONDYLOLISTHESIS 05/08/2009  . SLEEP APNEA 05/08/2009   Past Medical History  Diagnosis Date  . Osteoporosis   . Menopausal symptoms   . Postmenopausal HRT (hormone replacement therapy)   . RA (rheumatoid arthritis) 11/2004    methotrexate and Embrel  . Vitamin D deficiency   . Insomnia   . GERD  (gastroesophageal reflux disease)   . Asthma   . Fibroid   . Sleep apnea 2008    C-Pap   Past Surgical History  Procedure Laterality Date  . Vaginal delivery      x3  . Cystocele repair  10/2007    with graft  . Abdominal hysterectomy  01/1993    partial with AP repair, secondary to fibroids  . Breast surgery  1991    breast biopsy  . Melanoma excision Right 01/2012    excision right posterior arm   History  Substance Use Topics  . Smoking status: Never Smoker   . Smokeless tobacco: Never Used  . Alcohol Use: No   Family History  Problem Relation Age of Onset  . Breast cancer Sister 50   Allergies  Allergen Reactions  . Adalimumab     REACTION: frequent sinus infection  . Diclofenac     unk  . Fosamax [Alendronate Sodium]     Acid reflux  . Methotrexate Derivatives Nausea Only    Oral metotrexate  . Piroxicam     unk  . Sulfamethoxazole-Trimethoprim     REACTION: face and hands rash and felt burning sensation  . Tramadol Hcl     REACTION: nausea   Current Outpatient Prescriptions on File Prior to Visit  Medication Sig Dispense Refill  . albuterol (PROAIR HFA) 108 (90 BASE) MCG/ACT inhaler Inhale 2 puffs into the lungs every 4 (four) hours as needed.  1 Inhaler  3  . aspirin 81 MG tablet Take 81 mg by mouth every other day.       . Calcium Citrate-Vitamin D (CITRACAL/VITAMIN D PO) Take 1 tablet by mouth daily.        . Cholecalciferol (VITAMIN D3) 2000 UNITS TABS Take 1 tablet by mouth daily.        Marland Kitchen etanercept (ENBREL) 50 MG/ML injection Inject 50 mg into the skin once a week.       . fluticasone (FLONASE) 50 MCG/ACT nasal spray Place 2 sprays into the nose daily as needed for rhinitis or allergies.       . Folic Acid-Vit Y6-MAY O45 (FOLGARD RX) 2.2-25-1 MG TABS Take 1 tablet by mouth daily.        . methotrexate 25 MG/ML SOLN Inject 1 ml under the skin every week       . Multiple Vitamin (MULTIVITAMIN) capsule Take 1 capsule by mouth daily.        . Omega-3  Fatty Acids (FISH OIL PO) Take 1 capsule by mouth daily.      . pilocarpine (SALAGEN) 5 MG tablet Take 5 mg by mouth 3 (three) times  daily.       . RABEprazole (ACIPHEX) 20 MG tablet Take 20 mg by mouth daily.      Marland Kitchen zolpidem (AMBIEN) 5 MG tablet Take 2.5-5 mg by mouth at bedtime as needed for sleep.        No current facility-administered medications on file prior to visit.     Review of Systems    Review of Systems  Constitutional: Negative for fever, appetite change, fatigue and unexpected weight change.  Eyes: Negative for pain and visual disturbance.  Respiratory: Negative for cough and shortness of breath.   Cardiovascular: Negative for cp or palpitations    Gastrointestinal: Negative for nausea, diarrhea and constipation.  Genitourinary: Negative for urgency and frequency.  Skin: Negative for pallor or rash   Neurological: Negative for weakness, light-headedness, numbness and headaches.  Hematological: Negative for adenopathy. Does not bruise/bleed easily.  Psychiatric/Behavioral: Negative for dysphoric mood. The patient is not nervous/anxious.      Objective:   Physical Exam  Constitutional: She appears well-developed and well-nourished. No distress.  HENT:  Head: Normocephalic and atraumatic.  Right Ear: External ear normal.  Left Ear: External ear normal.  Nose: Nose normal.  Mouth/Throat: Oropharynx is clear and moist.  Eyes: Conjunctivae and EOM are normal. Pupils are equal, round, and reactive to light. Right eye exhibits no discharge. Left eye exhibits no discharge. No scleral icterus.  Neck: Normal range of motion. Neck supple. No JVD present. No thyromegaly present.  Cardiovascular: Normal rate, regular rhythm, normal heart sounds and intact distal pulses.  Exam reveals no gallop.   Pulmonary/Chest: Effort normal and breath sounds normal. No respiratory distress. She has no wheezes. She has no rales.  Abdominal: Soft. Bowel sounds are normal. She exhibits no  distension and no mass. There is no tenderness.  Genitourinary:  Deferred-pt has gyn appt this summer  Musculoskeletal: She exhibits no edema and no tenderness.  Lymphadenopathy:    She has no cervical adenopathy.  Neurological: She is alert. She has normal reflexes. No cranial nerve deficit. She exhibits normal muscle tone. Coordination normal.  Skin: Skin is warm and dry. No rash noted. No erythema. No pallor.  Solar lentigos diffusely  Pt sees dermatology yearly  Psychiatric: She has a normal mood and affect.          Assessment & Plan:

## 2013-02-09 NOTE — Patient Instructions (Signed)
You are due for a tetanus shot - see if you can get it affordably at the health dept  For congestion / sinus - try a vaporizer in your bedroom , and use nasal saline irrigations Try to stay active and make healthy food choices   Fat and Cholesterol Control Diet Fat and cholesterol levels in your blood and organs are influenced by your diet. High levels of fat and cholesterol may lead to diseases of the heart, small and large blood vessels, gallbladder, liver, and pancreas. CONTROLLING FAT AND CHOLESTEROL WITH DIET Although exercise and lifestyle factors are important, your diet is key. That is because certain foods are known to raise cholesterol and others to lower it. The goal is to balance foods for their effect on cholesterol and more importantly, to replace saturated and trans fat with other types of fat, such as monounsaturated fat, polyunsaturated fat, and omega-3 fatty acids. On average, a person should consume no more than 15 to 17 g of saturated fat daily. Saturated and trans fats are considered "bad" fats, and they will raise LDL cholesterol. Saturated fats are primarily found in animal products such as meats, butter, and cream. However, that does not mean you need to give up all your favorite foods. Today, there are good tasting, low-fat, low-cholesterol substitutes for most of the things you like to eat. Choose low-fat or nonfat alternatives. Choose round or loin cuts of red meat. These types of cuts are lowest in fat and cholesterol. Chicken (without the skin), fish, veal, and ground [place] breast are great choices. Eliminate fatty meats, such as hot dogs and salami. Even shellfish have little or no saturated fat. Have a 3 oz (85 g) portion when you eat lean meat, poultry, or fish. Trans fats are also called "partially hydrogenated oils." They are oils that have been scientifically manipulated so that they are solid at room temperature resulting in a longer shelf life and improved taste and  texture of foods in which they are added. Trans fats are found in stick margarine, some tub margarines, cookies, crackers, and baked goods.  When baking and cooking, oils are a great substitute for butter. The monounsaturated oils are especially beneficial since it is believed they lower LDL and raise HDL. The oils you should avoid entirely are saturated tropical oils, such as coconut and palm.  Remember to eat a lot from food groups that are naturally free of saturated and trans fat, including fish, fruit, vegetables, beans, grains (barley, rice, couscous, bulgur wheat), and pasta (without cream sauces).  IDENTIFYING FOODS THAT LOWER FAT AND CHOLESTEROL  Soluble fiber may lower your cholesterol. This type of fiber is found in fruits such as apples, vegetables such as broccoli, potatoes, and carrots, legumes such as beans, peas, and lentils, and grains such as barley. Foods fortified with plant sterols (phytosterol) may also lower cholesterol. You should eat at least 2 g per day of these foods for a cholesterol lowering effect.  Read package labels to identify low-saturated fats, trans fat free, and low-fat foods at the supermarket. Select cheeses that have only 2 to 3 g saturated fat per ounce. Use a heart-healthy tub margarine that is free of trans fats or partially hydrogenated oil. When buying baked goods (cookies, crackers), avoid partially hydrogenated oils. Breads and muffins should be made from whole grains (whole-wheat or whole oat flour, instead of "flour" or "enriched flour"). Buy non-creamy canned soups with reduced salt and no added fats.  FOOD PREPARATION TECHNIQUES  Never deep-fry.  If you must fry, either stir-fry, which uses very little fat, or use non-stick cooking sprays. When possible, broil, bake, or roast meats, and steam vegetables. Instead of putting butter or margarine on vegetables, use lemon and herbs, applesauce, and cinnamon (for squash and sweet potatoes). Use nonfat yogurt,  salsa, and low-fat dressings for salads.  LOW-SATURATED FAT / LOW-FAT FOOD SUBSTITUTES Meats / Saturated Fat (g)  Avoid: Steak, marbled (3 oz/85 g) / 11 g  Choose: Steak, lean (3 oz/85 g) / 4 g  Avoid: Hamburger (3 oz/85 g) / 7 g  Choose: Hamburger, lean (3 oz/85 g) / 5 g  Avoid: Ham (3 oz/85 g) / 6 g  Choose: Ham, lean cut (3 oz/85 g) / 2.4 g  Avoid: Chicken, with skin, dark meat (3 oz/85 g) / 4 g  Choose: Chicken, skin removed, dark meat (3 oz/85 g) / 2 g  Avoid: Chicken, with skin, light meat (3 oz/85 g) / 2.5 g  Choose: Chicken, skin removed, light meat (3 oz/85 g) / 1 g Dairy / Saturated Fat (g)  Avoid: Whole milk (1 cup) / 5 g  Choose: Low-fat milk, 2% (1 cup) / 3 g  Choose: Low-fat milk, 1% (1 cup) / 1.5 g  Choose: Skim milk (1 cup) / 0.3 g  Avoid: Hard cheese (1 oz/28 g) / 6 g  Choose: Skim milk cheese (1 oz/28 g) / 2 to 3 g  Avoid: Cottage cheese, 4% fat (1 cup) / 6.5 g  Choose: Low-fat cottage cheese, 1% fat (1 cup) / 1.5 g  Avoid: Ice cream (1 cup) / 9 g  Choose: Sherbet (1 cup) / 2.5 g  Choose: Nonfat frozen yogurt (1 cup) / 0.3 g  Choose: Frozen fruit bar / trace  Avoid: Whipped cream (1 tbs) / 3.5 g  Choose: Nondairy whipped topping (1 tbs) / 1 g Condiments / Saturated Fat (g)  Avoid: Mayonnaise (1 tbs) / 2 g  Choose: Low-fat mayonnaise (1 tbs) / 1 g  Avoid: Butter (1 tbs) / 7 g  Choose: Extra light margarine (1 tbs) / 1 g  Avoid: Coconut oil (1 tbs) / 11.8 g  Choose: Olive oil (1 tbs) / 1.8 g  Choose: Corn oil (1 tbs) / 1.7 g  Choose: Safflower oil (1 tbs) / 1.2 g  Choose: Sunflower oil (1 tbs) / 1.4 g  Choose: Soybean oil (1 tbs) / 2.4 g  Choose: Canola oil (1 tbs) / 1 g Document Released: 12/23/2004 Document Revised: 04/19/2012 Document Reviewed: 06/13/2010 [employer] Patient Information 2014 [place], [place].

## 2013-02-10 NOTE — Assessment & Plan Note (Signed)
Reviewed health habits including diet and exercise and skin cancer prevention Reviewed appropriate screening tests for age  Also reviewed health mt list, fam hx and immunization status , as well as social and family history   See HPI Labs reviewed  

## 2013-02-10 NOTE — Assessment & Plan Note (Signed)
Disc goals for lipids and reasons to control them Rev labs with pt Rev low sat fat diet in detail  This is up a bit - given handout on diet changes to make - will continue to follow

## 2013-02-10 NOTE — Assessment & Plan Note (Signed)
utd dexa  Followed by rheumatology Vit D level is therapeutic  Disc need for calcium/ vitamin D/ wt bearing exercise and bone density test every 2 y to monitor Disc safety/ fracture risk in detail

## 2013-02-14 DIAGNOSIS — Z79899 Other long term (current) drug therapy: Secondary | ICD-10-CM | POA: Diagnosis not present

## 2013-02-16 DIAGNOSIS — M069 Rheumatoid arthritis, unspecified: Secondary | ICD-10-CM | POA: Diagnosis not present

## 2013-02-16 DIAGNOSIS — M19049 Primary osteoarthritis, unspecified hand: Secondary | ICD-10-CM | POA: Diagnosis not present

## 2013-02-17 ENCOUNTER — Telehealth: Payer: Self-pay | Admitting: Family Medicine

## 2013-02-17 MED ORDER — FLUTICASONE PROPIONATE 50 MCG/ACT NA SUSP: 2.0000 | Freq: Every day | NASAL | Status: DC | PRN

## 2013-02-17 NOTE — Telephone Encounter (Signed)
Pt called and flonase nasal spray is out of stock at [facility] and they have no idea when they are getting medication in. Please call script into pt local pharmacy [facility] @ [phone].

## 2013-02-17 NOTE — Telephone Encounter (Signed)
done

## 2013-03-23 DIAGNOSIS — Z09 Encounter for follow-up examination after completed treatment for conditions other than malignant neoplasm: Secondary | ICD-10-CM | POA: Diagnosis not present

## 2013-03-23 DIAGNOSIS — M3500 Sicca syndrome, unspecified: Secondary | ICD-10-CM | POA: Diagnosis not present

## 2013-03-23 DIAGNOSIS — M069 Rheumatoid arthritis, unspecified: Secondary | ICD-10-CM | POA: Diagnosis not present

## 2013-03-23 DIAGNOSIS — M25569 Pain in unspecified knee: Secondary | ICD-10-CM | POA: Diagnosis not present

## 2013-05-10 DIAGNOSIS — Z79899 Other long term (current) drug therapy: Secondary | ICD-10-CM | POA: Diagnosis not present

## 2013-05-12 DIAGNOSIS — G4733 Obstructive sleep apnea (adult) (pediatric): Secondary | ICD-10-CM | POA: Diagnosis not present

## 2013-05-20 DIAGNOSIS — H2510 Age-related nuclear cataract, unspecified eye: Secondary | ICD-10-CM | POA: Diagnosis not present

## 2013-07-18 DIAGNOSIS — M069 Rheumatoid arthritis, unspecified: Secondary | ICD-10-CM | POA: Diagnosis not present

## 2013-07-18 DIAGNOSIS — M25549 Pain in joints of unspecified hand: Secondary | ICD-10-CM | POA: Diagnosis not present

## 2013-07-18 DIAGNOSIS — M25569 Pain in unspecified knee: Secondary | ICD-10-CM | POA: Diagnosis not present

## 2013-07-18 DIAGNOSIS — M3500 Sicca syndrome, unspecified: Secondary | ICD-10-CM | POA: Diagnosis not present

## 2013-08-09 ENCOUNTER — Encounter: Payer: Self-pay | Admitting: Nurse Practitioner

## 2013-08-09 ENCOUNTER — Ambulatory Visit (INDEPENDENT_AMBULATORY_CARE_PROVIDER_SITE_OTHER): Payer: Medicare Other | Admitting: Nurse Practitioner

## 2013-08-09 VITALS — BP 116/62 | HR 52 | Ht 65.75 in | Wt 177.0 lb

## 2013-08-09 DIAGNOSIS — Z01419 Encounter for gynecological examination (general) (routine) without abnormal findings: Secondary | ICD-10-CM

## 2013-08-09 DIAGNOSIS — M810 Age-related osteoporosis without current pathological fracture: Secondary | ICD-10-CM | POA: Diagnosis not present

## 2013-08-09 DIAGNOSIS — E559 Vitamin D deficiency, unspecified: Secondary | ICD-10-CM

## 2013-08-09 DIAGNOSIS — Z124 Encounter for screening for malignant neoplasm of cervix: Secondary | ICD-10-CM

## 2013-08-09 MED ORDER — ESTRADIOL 10 MCG VA TABS: 1.0000 | ORAL_TABLET | VAGINAL | Status: DC

## 2013-08-09 NOTE — Progress Notes (Signed)
68 y.o. G3P3 Married Caucasian Fe here for annual exam.  No new health problems.  She and husband are enjoying retirement.  Patient's last menstrual period was 01/06/1993.          Sexually active: Yes.    The current method of family planning is status post hysterectomy.    Exercising: Yes.    yoga, walking and gym Smoker:  no  Health Maintenance: Pap:  NA MMG:  11/02/2012 BI-RADS neg Colonoscopy:  02/2012 Normal repeat in 10 years BMD:   09/23/2012 Left forearm T score -1.7 done at Ortho TDaP:  2015 Shingles: not advised Pneumovax: 2-3 year ago Labs: PCP   reports that she has never smoked. She has never used smokeless tobacco. She reports that she does not drink alcohol or use illicit drugs.  Past Medical History  Diagnosis Date  . Osteoporosis   . Menopausal symptoms   . Postmenopausal HRT (hormone replacement therapy)   . RA (rheumatoid arthritis) 11/2004    methotrexate and Embrel  . Vitamin D deficiency   . Insomnia   . GERD (gastroesophageal reflux disease)   . Asthma   . Fibroid   . Sleep apnea 2008    C-Pap    Past Surgical History  Procedure Laterality Date  . Vaginal delivery      x3  . Cystocele repair  10/2007    with graft  . Abdominal hysterectomy  01/1993    partial with AP repair, secondary to fibroids  . Breast surgery  1991    breast biopsy  . Melanoma excision Right 01/2012    excision right posterior arm    Current Outpatient Prescriptions  Medication Sig Dispense Refill  . albuterol (PROAIR HFA) 108 (90 BASE) MCG/ACT inhaler Inhale 2 puffs into the lungs every 4 (four) hours as needed.  1 Inhaler  3  . aspirin 81 MG tablet Take 81 mg by mouth every other day.       . Calcium Citrate-Vitamin D (CITRACAL/VITAMIN D PO) Take 1 tablet by mouth daily.        . Cholecalciferol (VITAMIN D3) 2000 UNITS TABS Take 1 tablet by mouth daily.        Marland Kitchen etanercept (ENBREL) 50 MG/ML injection Inject 50 mg into the skin once a week.       . fluticasone  (FLONASE) 50 MCG/ACT nasal spray Place 2 sprays into both nostrils daily as needed for rhinitis or allergies.  48 g  3  . Folic Acid-Vit B7-SEG B15 (FOLGARD RX) 2.2-25-1 MG TABS Take 1 tablet by mouth daily.        . methotrexate 25 MG/ML SOLN Inject 1 ml under the skin every week       . Multiple Vitamin (MULTIVITAMIN) capsule Take 1 capsule by mouth daily.        . pilocarpine (SALAGEN) 5 MG tablet Take 5 mg by mouth 3 (three) times daily.       . RABEprazole (ACIPHEX) 20 MG tablet Take 20 mg by mouth daily.      Marland Kitchen zolpidem (AMBIEN) 5 MG tablet Take 2.5-5 mg by mouth at bedtime as needed for sleep.       . Estradiol 10 MCG TABS vaginal tablet Place 1 tablet (10 mcg total) vaginally 2 (two) times a week.  24 tablet  3   No current facility-administered medications for this visit.    Family History  Problem Relation Age of Onset  . Breast cancer Sister 13  . Thyroid disease  Sister     ROS:  Pertinent items are noted in HPI.  Otherwise, a comprehensive ROS was negative.  Exam:   BP 116/62  Pulse 52  Ht 5' 5.75" (1.67 m)  Wt 177 lb (80.287 kg)  BMI 28.79 kg/m2  LMP 01/06/1993 Height: 5' 5.75" (167 cm)  Ht Readings from Last 3 Encounters:  08/09/13 5' 5.75" (1.67 m)  02/09/13 5' 5.75" (1.67 m)  09/14/12 5' 5.5" (1.664 m)    General appearance: alert, cooperative and appears stated age Head: Normocephalic, without obvious abnormality, atraumatic Neck: no adenopathy, supple, symmetrical, trachea midline and thyroid normal to inspection and palpation Lungs: clear to auscultation bilaterally Breasts: normal appearance, no masses or tenderness Heart: regular rate and rhythm Abdomen: soft, non-tender; no masses,  no organomegaly Extremities: extremities normal, atraumatic, no cyanosis or edema Skin: Skin color, texture, turgor normal. No rashes or lesions Lymph nodes: Cervical, supraclavicular, and axillary nodes normal. No abnormal inguinal nodes palpated Neurologic: Grossly  normal   Pelvic: External genitalia:  no lesions              Urethra:  normal appearing urethra with no masses, tenderness or lesions              Bartholin's and Skene's: normal                 Vagina: atrophic appearing vagina with normal color and discharge, no lesions              Cervix: absent              Pap taken: No. Bimanual Exam:  Uterus:  uterus absent              Adnexa: no mass, fullness, tenderness               Rectovaginal: Confirms               Anus:  normal sphincter tone, no lesions  A:  Well Woman with normal exam  S/P TAH, APR 01/1993 secondary to fibroids  History of RA on methotrexate  History of Vit D deficiency  Osteoporosis - Actonel 2004- 2006 &  2008- 2010  P:   Reviewed health and wellness pertinent to exam  Pap smear not taken today  Mammogram is due 10/15  Order is placed for BMD  Counseled on breast self exam, mammography screening, osteoporosis, adequate intake of calcium and vitamin D, diet and exercise, Kegel's exercises return annually or prn  An After Visit Summary was printed and given to the patient.

## 2013-08-09 NOTE — Patient Instructions (Addendum)

## 2013-08-10 DIAGNOSIS — Z79899 Other long term (current) drug therapy: Secondary | ICD-10-CM | POA: Diagnosis not present

## 2013-08-10 LAB — VITAMIN D 25 HYDROXY (VIT D DEFICIENCY, FRACTURES): Vit D, 25-Hydroxy: 61 ng/mL (ref 30–89)

## 2013-08-14 NOTE — Progress Notes (Signed)
Encounter reviewed by Dr. Brook Silva.  

## 2013-08-16 DIAGNOSIS — L821 Other seborrheic keratosis: Secondary | ICD-10-CM | POA: Diagnosis not present

## 2013-08-16 DIAGNOSIS — D239 Other benign neoplasm of skin, unspecified: Secondary | ICD-10-CM | POA: Diagnosis not present

## 2013-08-16 DIAGNOSIS — D1801 Hemangioma of skin and subcutaneous tissue: Secondary | ICD-10-CM | POA: Diagnosis not present

## 2013-08-16 DIAGNOSIS — L819 Disorder of pigmentation, unspecified: Secondary | ICD-10-CM | POA: Diagnosis not present

## 2013-08-16 DIAGNOSIS — Z85820 Personal history of malignant melanoma of skin: Secondary | ICD-10-CM | POA: Diagnosis not present

## 2013-08-26 ENCOUNTER — Ambulatory Visit: Payer: Medicare Other | Admitting: Family Medicine

## 2013-08-29 ENCOUNTER — Ambulatory Visit (INDEPENDENT_AMBULATORY_CARE_PROVIDER_SITE_OTHER): Payer: Medicare Other | Admitting: Internal Medicine

## 2013-08-29 ENCOUNTER — Ambulatory Visit (INDEPENDENT_AMBULATORY_CARE_PROVIDER_SITE_OTHER): Payer: Medicare Other | Admitting: Family Medicine

## 2013-08-29 ENCOUNTER — Encounter: Payer: Self-pay | Admitting: Family Medicine

## 2013-08-29 ENCOUNTER — Encounter: Payer: Self-pay | Admitting: Internal Medicine

## 2013-08-29 VITALS — BP 124/86 | HR 75 | Temp 98.3°F | Ht 65.75 in | Wt 177.2 lb

## 2013-08-29 VITALS — BP 140/88 | HR 66 | Ht 67.0 in | Wt 177.0 lb

## 2013-08-29 DIAGNOSIS — R0609 Other forms of dyspnea: Secondary | ICD-10-CM | POA: Diagnosis not present

## 2013-08-29 DIAGNOSIS — J019 Acute sinusitis, unspecified: Secondary | ICD-10-CM | POA: Diagnosis not present

## 2013-08-29 DIAGNOSIS — B9689 Other specified bacterial agents as the cause of diseases classified elsewhere: Secondary | ICD-10-CM

## 2013-08-29 DIAGNOSIS — R0600 Dyspnea, unspecified: Secondary | ICD-10-CM

## 2013-08-29 DIAGNOSIS — R0989 Other specified symptoms and signs involving the circulatory and respiratory systems: Secondary | ICD-10-CM

## 2013-08-29 MED ORDER — AMOXICILLIN-POT CLAVULANATE 875-125 MG PO TABS: 1.0000 | ORAL_TABLET | Freq: Two times a day (BID) | ORAL | Status: DC

## 2013-08-29 NOTE — Progress Notes (Signed)
Subjective:    Patient ID: Deanna Norris, female    DOB: Oct 04, 1945, 68 y.o.   MRN: [id number]  HPI Here with uri symptoms  Thinks she has a sinus infx   Had a root canal on July 9th -then capped later 8/14 she caught a cold - out of town  Then she had a twinge of pain in the area of fixed tooth- she went in to see the dentist Took an xray - could see sinus fullness  He gave her a zpak (thursday)  By Sat her R cheek was quite swollen - she took 500 mg azith that day (daughter is a NP-told her to do that)  Feverish one day  Ears feel full -not painful  Face hurts on the R side  No cough  Taking mucinex D Sleeping propped up    She also takes enbrel and mtx - has to cover infx well   Patient Active Problem List   Diagnosis Date Noted  . Acute bacterial sinusitis 08/29/2013  . Encounter for Medicare annual wellness exam 01/31/2013  . Palpitations 09/14/2012  . Dyspnea 09/14/2012  . Nodule of right lung 09/14/2012  . 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 HYPERLIPIDEMIA 05/08/2009  . 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 ASTHMA 05/08/2009  . GERD 05/08/2009  . MENOPAUSAL DISORDER 05/08/2009  . ARTHRITIS, RHEUMATOID 05/08/2009  . OSTEOPOROSIS 05/08/2009  . SPONDYLOLISTHESIS 05/08/2009  . SLEEP APNEA 05/08/2009   Past Medical History  Diagnosis Date  . Osteoporosis   . Menopausal symptoms   . Postmenopausal HRT (hormone replacement therapy)   . RA (rheumatoid arthritis) 11/2004    methotrexate and Embrel  . Vitamin D deficiency   . Insomnia   . GERD (gastroesophageal reflux disease)   . Asthma   . Fibroid   . Sleep apnea 2008    C-Pap   Past Surgical History  Procedure Laterality Date  . Vaginal delivery      x3  . Cystocele repair  10/2007    with graft  . Abdominal hysterectomy  01/1993    partial with AP repair, secondary to fibroids  . Breast surgery  1991    breast biopsy  . Melanoma excision Right 01/2012    excision right posterior arm  . Nasal sinus surgery   07/1991   History  Substance Use Topics  . Smoking status: Never Smoker   . Smokeless tobacco: Never Used  . Alcohol Use: No   Family History  Problem Relation Age of Onset  . Breast cancer Sister 49  . Thyroid disease Sister    Allergies  Allergen Reactions  . Adalimumab     REACTION: frequent sinus infection  . Diclofenac     unk  . Fosamax [Alendronate Sodium]     Acid reflux  . Methotrexate Derivatives Nausea Only    Oral metotrexate  . Piroxicam     unk  . Sulfamethoxazole-Trimethoprim     REACTION: face and hands rash and felt burning sensation  . Tramadol Hcl     REACTION: nausea   Current Outpatient Prescriptions on File Prior to Visit  Medication Sig Dispense Refill  . albuterol (PROAIR HFA) 108 (90 BASE) MCG/ACT inhaler Inhale 2 puffs into the lungs every 4 (four) hours as needed.  1 Inhaler  3  . aspirin 81 MG tablet Take 81 mg by mouth every other day.       . Calcium Citrate-Vitamin D (CITRACAL/VITAMIN D PO) Take 1 tablet by mouth daily.        Marland Kitchen  Cholecalciferol (VITAMIN D3) 2000 UNITS TABS Take 1 tablet by mouth daily.        . Estradiol 10 MCG TABS vaginal tablet Place 1 tablet (10 mcg total) vaginally 2 (two) times a week.  24 tablet  3  . etanercept (ENBREL) 50 MG/ML injection Inject 50 mg into the skin once a week.       . fluticasone (FLONASE) 50 MCG/ACT nasal spray Place 2 sprays into both nostrils daily as needed for rhinitis or allergies.  48 g  3  . Folic Acid-Vit V2-ZDG U44 (FOLGARD RX) 2.2-25-1 MG TABS Take 1 tablet by mouth daily.        . methotrexate 25 MG/ML SOLN Inject 1 ml under the skin every week       . Multiple Vitamin (MULTIVITAMIN) capsule Take 1 capsule by mouth daily.        . pilocarpine (SALAGEN) 5 MG tablet Take 5 mg by mouth 3 (three) times daily.       . RABEprazole (ACIPHEX) 20 MG tablet Take 20 mg by mouth daily.      Marland Kitchen zolpidem (AMBIEN) 5 MG tablet Take 2.5-5 mg by mouth at bedtime as needed for sleep.       Marland Kitchen acetaminophen  (TYLENOL) 325 MG tablet Take 650 mg by mouth every 6 (six) hours as needed.      . naproxen sodium (ANAPROX) 220 MG tablet Take 220 mg by mouth 2 (two) times daily as needed.      . pseudoephedrine-guaifenesin (MUCINEX D) 60-600 MG per tablet Take 1 tablet by mouth every 12 (twelve) hours.       No current facility-administered medications on file prior to visit.    Review of Systems Review of Systems  Constitutional: Negative for fever, appetite change,  and unexpected weight change.  Eyes: Negative for pain and visual disturbance.  ENT pos for facial pain/ congestion/ purlent nasal drainage/ neg for ST Respiratory: Negative for cough and shortness of breath.   Cardiovascular: Negative for cp or palpitations    Gastrointestinal: Negative for nausea, diarrhea and constipation.  Genitourinary: Negative for urgency and frequency.  Skin: Negative for pallor or rash   Neurological: Negative for weakness, light-headedness, numbness and headaches.  Hematological: Negative for adenopathy. Does not bruise/bleed easily.  Psychiatric/Behavioral: Negative for dysphoric mood. The patient is not nervous/anxious.         Objective:   Physical Exam  Constitutional: She appears well-developed and well-nourished. No distress.  overwt and well app  HENT:  Head: Normocephalic and atraumatic.  Right Ear: External ear normal.  Left Ear: External ear normal.  Mouth/Throat: Oropharynx is clear and moist. No oropharyngeal exudate.  Nares are injected and congested  Tender R frontal and maxillary sinus tenderness No facial swelling    Eyes: Conjunctivae and EOM are normal. Pupils are equal, round, and reactive to light. Right eye exhibits no discharge. Left eye exhibits no discharge.  Neck: Normal range of motion. Neck supple.  Cardiovascular: Normal rate and regular rhythm.   Pulmonary/Chest: Effort normal and breath sounds normal. No respiratory distress. She has no wheezes. She has no rales.    Lymphadenopathy:    She has no cervical adenopathy.  Neurological: She is alert. No cranial nerve deficit.  Skin: Skin is warm and dry. No rash noted. No erythema. No pallor.  Psychiatric: She has a normal mood and affect.          Assessment & Plan:   Problem List Items Addressed This  Visit     Respiratory   Acute bacterial sinusitis - Primary     In immunocomp pt s/p uri  S/p zpak- helped/not resolved tx with augmentin Hold methotrexate on this  Fluids/ nasal saline Disc symptomatic care - see instructions on AVS Update if not starting to improve in a week or if worsening      Relevant Medications      amoxicillin-clavulanate (AUGMENTIN) tablet 875-125 mg

## 2013-08-29 NOTE — Patient Instructions (Signed)
#  Shortness of breath  - unclear cause; as discussed wide range of possibilities  - please do PFT test next few days; call [phone] to let my nurse Daneil Dan know you have completed it  - I wil review test and discuss next step that could involve repeat CT chest or bike pulmonary stress test or both

## 2013-08-29 NOTE — Progress Notes (Signed)
Subjective:    Patient ID: Deanna Norris, female    DOB: 1945/05/31, 68 y.o.   MRN: [id number] PCP Loura Pardon, MD Referred by Dr Bo Merino  HPI  IOV 08/29/2013  Chief Complaint  Patient presents with  . Pulmonary consult    Referred by Dr. Estanislado Pandy for SOB. Pt c/o DOE, mostly when walking up stairs. Pt has RA.     68 year old female diagnosed with rheumatoid arthritis in 2006/2008. Since then she's been on methotrexate initially the oral form and currently in the IV form. She's also been on TNF alpha blockade initially on Humira but now on Enbrel also since a long time. She is a nonsmoker.  Weight Body mass index is 27.72 kg/(m^2). and with 20 pound gain in the last 5 years    - She reports insidious onset of shortness of breath for the last one in. It is mild in intensity. It is progressive. It is exertional and relieved by rest. It is not reproduced all the time with exertion but definitely does not produce bedrest. It is brought on when climbing one flight of stairs particularly when she is getting low and relieved by rest. There is no associated orthopnea, paroxysmal nocturnal dyspnea, cough, hemoptysis, chest pain, syncopeBecause of these symptoms her rheumatologist has referred her to [place]. . Walking desaturation test 180 feet x3 laps in the office on room at: She did not desaturate and it did not reproduce  Symptoms. -Also, due to some palpitations a year ago in September 2014 she did have a CT scan of the chest without contrast that to me looks essentially clear although the radiologist has described some nonspecific infiltrates in the upper lobe. She also had echocardiogram at that time but is reported to be normal    Past Medical History  Diagnosis Date  . Osteoporosis   . Menopausal symptoms   . Postmenopausal HRT (hormone replacement therapy)   . RA (rheumatoid arthritis) 11/2004    methotrexate and Embrel  . Vitamin D deficiency   . Insomnia   . GERD  (gastroesophageal reflux disease)   . Asthma   . Fibroid   . Sleep apnea 2008    C-Pap     Family History  Problem Relation Age of Onset  . Breast cancer Sister 56  . Thyroid disease Sister      History   Social History  . Marital Status: Married    Spouse Name: N/A    Number of Children: N/A  . Years of Education: N/A   Occupational History  . housewife    Social History Main Topics  . Smoking status: Never Smoker   . Smokeless tobacco: Never Used  . Alcohol Use: No  . Drug Use: No  . Sexual Activity: Yes    Birth Control/ Protection: Surgical     Comment: TAH   Other Topics Concern  . Not on file   Social History Narrative  . No narrative on file     Allergies  Allergen Reactions  . Adalimumab     REACTION: frequent sinus infection  . Diclofenac     unk  . Fosamax [Alendronate Sodium]     Acid reflux  . Methotrexate Derivatives Nausea Only    Oral metotrexate  . Piroxicam     unk  . Sulfamethoxazole-Trimethoprim     REACTION: face and hands rash and felt burning sensation  . Tramadol Hcl     REACTION: nausea     Outpatient Prescriptions Prior  to Visit  Medication Sig Dispense Refill  . albuterol (PROAIR HFA) 108 (90 BASE) MCG/ACT inhaler Inhale 2 puffs into the lungs every 4 (four) hours as needed.  1 Inhaler  3  . amoxicillin-clavulanate (AUGMENTIN) 875-125 MG per tablet Take 1 tablet by mouth 2 (two) times daily.  20 tablet  0  . aspirin 81 MG tablet Take 81 mg by mouth every other day.       . Calcium Citrate-Vitamin D (CITRACAL/VITAMIN D PO) Take 1 tablet by mouth daily.        . Cholecalciferol (VITAMIN D3) 2000 UNITS TABS Take 1 tablet by mouth daily.        . Estradiol 10 MCG TABS vaginal tablet Place 1 tablet (10 mcg total) vaginally 2 (two) times a week.  24 tablet  3  . etanercept (ENBREL) 50 MG/ML injection Inject 50 mg into the skin once a week.       . fluticasone (FLONASE) 50 MCG/ACT nasal spray Place 2 sprays into both nostrils daily  as needed for rhinitis or allergies.  48 g  3  . Folic Acid-Vit F7-CBS W96 (FOLGARD RX) 2.2-25-1 MG TABS Take 1 tablet by mouth daily.        . methotrexate 25 MG/ML SOLN Inject 1 ml under the skin every week       . Multiple Vitamin (MULTIVITAMIN) capsule Take 1 capsule by mouth daily.        . pilocarpine (SALAGEN) 5 MG tablet Take 5 mg by mouth 3 (three) times daily.       . RABEprazole (ACIPHEX) 20 MG tablet Take 20 mg by mouth daily.      Marland Kitchen zolpidem (AMBIEN) 5 MG tablet Take 2.5-5 mg by mouth at bedtime as needed for sleep.       Marland Kitchen azithromycin (ZITHROMAX) 250 MG tablet Take by mouth daily. Take as directed       No facility-administered medications prior to visit.       Review of Systems  Constitutional: Negative for fever and unexpected weight change.  HENT: Positive for congestion, postnasal drip, rhinorrhea, sinus pressure and trouble swallowing. Negative for dental problem, ear pain, nosebleeds, sneezing and sore throat.   Eyes: Negative for redness and itching.  Respiratory: Positive for shortness of breath. Negative for cough, chest tightness and wheezing.   Cardiovascular: Positive for leg swelling. Negative for palpitations.  Gastrointestinal: Negative for nausea and vomiting.  Genitourinary: Negative for dysuria.  Musculoskeletal: Negative for joint swelling.  Skin: Negative for rash.  Neurological: Negative for headaches.  Hematological: Does not bruise/bleed easily.  Psychiatric/Behavioral: Negative for dysphoric mood. The patient is not nervous/anxious.        Objective:   Physical Exam  Vitals reviewed. Constitutional: She is oriented to person, place, and time. She appears well-developed and well-nourished. No distress.  HENT:  Head: Normocephalic and atraumatic.  Right Ear: External ear normal.  Left Ear: External ear normal.  Mouth/Throat: Oropharynx is clear and moist. No oropharyngeal exudate.  Eyes: Conjunctivae and EOM are normal. Pupils are equal,  round, and reactive to light. Right eye exhibits no discharge. Left eye exhibits no discharge. No scleral icterus.  Neck: Normal range of motion. Neck supple. No JVD present. No tracheal deviation present. No thyromegaly present.  Cardiovascular: Normal rate, regular rhythm, normal heart sounds and intact distal pulses.  Exam reveals no gallop and no friction rub.   No murmur heard. Pulmonary/Chest: Effort normal and breath sounds normal. No respiratory distress. She has  no wheezes. She has no rales. She exhibits no tenderness.  Abdominal: Soft. Bowel sounds are normal. She exhibits no distension and no mass. There is no tenderness. There is no rebound and no guarding.  Musculoskeletal: Normal range of motion. She exhibits no edema and no tenderness.  Some rA deformitites of hand  Lymphadenopathy:    She has no cervical adenopathy.  Neurological: She is alert and oriented to person, place, and time. She has normal reflexes. No cranial nerve deficit. She exhibits normal muscle tone. Coordination normal.  Skin: Skin is warm and dry. No rash noted. She is not diaphoretic. No erythema. No pallor.  Psychiatric: She has a normal mood and affect. Her behavior is normal. Judgment and thought content normal.     Filed Vitals:   08/29/13 1546  BP: 140/88  Pulse: 66  Height: 5\' 7"  (1.702 m)  Weight: 177 lb (80.287 kg)  SpO2: 95%         Assessment & Plan:  #Shortness of breath  - unclear cause; as discussed wide range of possibilities including new onset ILD since sept 2014, deconditoning and weight gain and other RA related lung diseases such as PAH, Bronchiolitis  PLAN  - please do PFT test next few days; call [phone] to let my nurse Daneil Dan know you have completed it  - I wil review test and discuss next step that could involve repeat CT chest or bike pulmonary stress test or both

## 2013-08-29 NOTE — Assessment & Plan Note (Signed)
In immunocomp pt s/p uri  S/p zpak- helped/not resolved tx with augmentin Hold methotrexate on this  Fluids/ nasal saline Disc symptomatic care - see instructions on AVS Update if not starting to improve in a week or if worsening

## 2013-08-29 NOTE — Patient Instructions (Signed)
Take augmentin as directed  Hold methotrexate while on it this week  Drink lots of fluids  Try nasal saline spray  Update if not starting to improve in a week or if worsening

## 2013-08-29 NOTE — Progress Notes (Signed)
Pre visit review using our clinic review tool, if applicable. No additional management support is needed unless otherwise documented below in the visit note. 

## 2013-09-01 ENCOUNTER — Ambulatory Visit (INDEPENDENT_AMBULATORY_CARE_PROVIDER_SITE_OTHER): Payer: Medicare Other | Admitting: Internal Medicine

## 2013-09-01 DIAGNOSIS — R0600 Dyspnea, unspecified: Secondary | ICD-10-CM

## 2013-09-01 DIAGNOSIS — R0989 Other specified symptoms and signs involving the circulatory and respiratory systems: Secondary | ICD-10-CM | POA: Diagnosis not present

## 2013-09-01 DIAGNOSIS — R0609 Other forms of dyspnea: Secondary | ICD-10-CM

## 2013-09-01 LAB — PULMONARY FUNCTION TEST
DL/VA % pred: 99 %
DL/VA: 4.99 ml/min/mmHg/L
DLCO unc % pred: 74 %
DLCO unc: 20.14 ml/min/mmHg
FEF 25-75 Post: 2.34 L/sec
FEF 25-75 Pre: 1.37 L/sec
FEF2575-%CHANGE-POST: 70 %
FEF2575-%PRED-POST: 110 %
FEF2575-%Pred-Pre: 64 %
FEV1-%CHANGE-POST: 12 %
FEV1-%PRED-POST: 90 %
FEV1-%Pred-Pre: 80 %
FEV1-PRE: 2.03 L
FEV1-Post: 2.29 L
FEV1FVC-%CHANGE-POST: 9 %
FEV1FVC-%PRED-PRE: 96 %
FEV6-%Change-Post: 2 %
FEV6-%Pred-Post: 89 %
FEV6-%Pred-Pre: 87 %
FEV6-POST: 2.86 L
FEV6-Pre: 2.78 L
FEV6FVC-%CHANGE-POST: 0 %
FEV6FVC-%PRED-PRE: 104 %
FEV6FVC-%Pred-Post: 104 %
FVC-%Change-Post: 2 %
FVC-%PRED-PRE: 83 %
FVC-%Pred-Post: 86 %
FVC-PRE: 2.78 L
FVC-Post: 2.86 L
PRE FEV6/FVC RATIO: 100 %
Post FEV1/FVC ratio: 80 %
Post FEV6/FVC ratio: 100 %
Pre FEV1/FVC ratio: 73 %
RV % PRED: 101 %
RV: 2.29 L
TLC % pred: 102 %
TLC: 5.47 L

## 2013-09-01 NOTE — Progress Notes (Signed)
PFT done today. 

## 2013-09-05 ENCOUNTER — Telehealth: Payer: Self-pay | Admitting: Internal Medicine

## 2013-09-05 DIAGNOSIS — R911 Solitary pulmonary nodule: Secondary | ICD-10-CM

## 2013-09-05 DIAGNOSIS — R0600 Dyspnea, unspecified: Secondary | ICD-10-CM

## 2013-09-05 NOTE — Telephone Encounter (Signed)
Pt calling to get her PFT results from Thursday.  MR please advise.   thanks

## 2013-09-05 NOTE — Telephone Encounter (Signed)
PFt - essentialy normall except mild reduction of efficiency of O2 transfer from lung to blood - borderline low / low-normal. Given RA and RA drugs despite having normal CT chest Sept 2014 (just a year ago)    - please order  High Resolution CT chest without contrast on ILD protocol. Only  Dr Lorin Picket or Dr. Vinnie Langton to read   - if these are normal, I will order a CPST   - she has to call 24h after CT is done to get results and get next step in plan  Thanks  Dr. Brand Males, M.D., [facility].C.P Pulmonary and Critical Care Medicine Staff Physician [facility] Pager: [phone], If no answer or between  15:00h - 7:00h: call [phone]  09/05/2013 12:17 PM

## 2013-09-05 NOTE — Telephone Encounter (Signed)
Results have been explained to patient, pt expressed understanding. Order placed for High Res CT--pt aware.  Nothing further needed.

## 2013-09-07 DIAGNOSIS — G4733 Obstructive sleep apnea (adult) (pediatric): Secondary | ICD-10-CM | POA: Diagnosis not present

## 2013-09-08 ENCOUNTER — Encounter: Payer: Self-pay | Admitting: Internal Medicine

## 2013-09-08 ENCOUNTER — Ambulatory Visit (INDEPENDENT_AMBULATORY_CARE_PROVIDER_SITE_OTHER): Payer: Medicare Other | Admitting: Internal Medicine

## 2013-09-08 VITALS — BP 92/60 | HR 78 | Temp 98.0°F | Wt 171.5 lb

## 2013-09-08 DIAGNOSIS — J0100 Acute maxillary sinusitis, unspecified: Secondary | ICD-10-CM

## 2013-09-08 MED ORDER — PREDNISONE 10 MG PO TABS: ORAL_TABLET | ORAL | Status: DC

## 2013-09-08 NOTE — Patient Instructions (Addendum)

## 2013-09-08 NOTE — Progress Notes (Signed)
HPI  Pt presents to the clinic today with continued sinus symptoms. She was treated for the same 2 weeks ago with a zpack by her dentist. Her symptoms had not resolved so she saw Dr. Glori Bickers 08/29/13, who started her on augmentin. She should be taking her last dose today. She reports that she is still experiencing facial pain, pressure and ear fullness. She denies fever or chills. She has also tried Mucinex and nasal saline. She is concerned because she is leaving to go out of town this weekend.  Review of Systems    Past Medical History  Diagnosis Date  . Osteoporosis   . Menopausal symptoms   . Postmenopausal HRT (hormone replacement therapy)   . RA (rheumatoid arthritis) 11/2004    methotrexate and Embrel  . Vitamin D deficiency   . Insomnia   . GERD (gastroesophageal reflux disease)   . Asthma   . Fibroid   . Sleep apnea 2008    C-Pap    Family History  Problem Relation Age of Onset  . Breast cancer Sister 63  . Thyroid disease Sister     History   Social History  . Marital Status: Married    Spouse Name: N/A    Number of Children: N/A  . Years of Education: N/A   Occupational History  . housewife    Social History Main Topics  . Smoking status: Never Smoker   . Smokeless tobacco: Never Used  . Alcohol Use: No  . Drug Use: No  . Sexual Activity: Yes    Birth Control/ Protection: Surgical     Comment: TAH   Other Topics Concern  . Not on file   Social History Narrative  . No narrative on file    Allergies  Allergen Reactions  . Adalimumab     REACTION: frequent sinus infection  . Diclofenac     unk  . Fosamax [Alendronate Sodium]     Acid reflux  . Methotrexate Derivatives Nausea Only    Oral metotrexate  . Piroxicam     unk  . Sulfamethoxazole-Trimethoprim     REACTION: face and hands rash and felt burning sensation  . Tramadol Hcl     REACTION: nausea     Constitutional: Positive headache, fatigue. Denies fever or abrupt weight changes.   HEENT:  Positive facial pain, nasal congestion and sore throat. Denies eye redness, ear pain, ringing in the ears, wax buildup, runny nose or bloody nose. Respiratory: Denies cough, difficulty breathing or shortness of breath.  Cardiovascular: Denies chest pain, chest tightness, palpitations or swelling in the hands or feet.   No other specific complaints in a complete review of systems (except as listed in HPI above).  Objective:  BP 92/60  Pulse 78  Temp(Src) 98 F (36.7 C) (Oral)  Wt 171 lb 8 oz (77.792 kg)  SpO2 97%  LMP 01/06/1993   General: Appears her stated age, well developed, well nourished in NAD. HEENT: Head: normal shape and size, frontal sinus tenderness noted; Ears: Tm's gray and intact, normal light reflex; Nose: mucosa boggy and moist, septum midline; Throat/Mouth: + PND. Teeth present, mucosa pink and moist, no exudate noted, no lesions or ulcerations noted.  Cardiovascular: Normal rate and rhythm. S1,S2 noted.  No murmur, rubs or gallops noted. No JVD or BLE edema. No carotid bruits noted. Pulmonary/Chest: Normal effort and positive vesicular breath sounds. No respiratory distress. No wheezes, rales or ronchi noted.      Assessment & Plan:   Acute  maxillary sinusitis  Can use a Neti Pot which can be purchased from your local drug store. Continue Flonase No need to repeat abx at this time Will try 6 day pred taper- hold mtx while on this medication, ok to restart once done  RTC as needed or if symptoms persist.

## 2013-09-08 NOTE — Progress Notes (Signed)
Pre visit review using our clinic review tool, if applicable. No additional management support is needed unless otherwise documented below in the visit note. 

## 2013-09-16 ENCOUNTER — Ambulatory Visit (INDEPENDENT_AMBULATORY_CARE_PROVIDER_SITE_OTHER)
Admission: RE | Admit: 2013-09-16 | Discharge: 2013-09-16 | Disposition: A | Discharge status: Home / Self Care | Payer: Medicare Other | Source: Ambulatory Visit | Attending: Family Medicine | Admitting: Family Medicine

## 2013-09-16 ENCOUNTER — Other Ambulatory Visit: Payer: Medicare Other

## 2013-09-16 ENCOUNTER — Telehealth: Payer: Self-pay | Admitting: Family Medicine

## 2013-09-16 ENCOUNTER — Encounter: Payer: Self-pay | Admitting: Family Medicine

## 2013-09-16 ENCOUNTER — Ambulatory Visit (INDEPENDENT_AMBULATORY_CARE_PROVIDER_SITE_OTHER): Payer: Medicare Other | Admitting: Family Medicine

## 2013-09-16 VITALS — BP 112/64 | HR 68 | Temp 98.0°F | Ht 67.0 in | Wt 173.5 lb

## 2013-09-16 DIAGNOSIS — J019 Acute sinusitis, unspecified: Secondary | ICD-10-CM

## 2013-09-16 DIAGNOSIS — J329 Chronic sinusitis, unspecified: Secondary | ICD-10-CM

## 2013-09-16 DIAGNOSIS — B9689 Other specified bacterial agents as the cause of diseases classified elsewhere: Secondary | ICD-10-CM

## 2013-09-16 NOTE — Patient Instructions (Signed)
Stop at check out for referral for CT of sinuses  I will get back to you with a plan  Drink lots of fluids  Use warm compress on face if it helps  Breathe steam  mucinex D - can be helpful (that is mucinex and sudafed)  If suddenly worse let me know

## 2013-09-16 NOTE — Assessment & Plan Note (Signed)
Ongoing in immunocomp pt  S/p zpack/ augmentin /prednisone Using flonase and nasal saline On cpap  Ordered CT of sinuses

## 2013-09-16 NOTE — Telephone Encounter (Signed)
Pt called she just left having her ct.  She stated she wanted you to call her on her cell Cell phone # [phone]

## 2013-09-16 NOTE — Progress Notes (Signed)
Subjective:    Patient ID: Deanna Norris, female    DOB: Sep 24, 1945, 68 y.o.   MRN: [id number]  HPI Here for f/u of sinusitis  Not getting better  Has had zpak and also augmentin and used netti pot and flonase and pred taper  At different times  Head is "full" and ears are "full" Improved but not gone   No fever  No nasal drainage - just pressure and fullness Pain is not always in the same place  [place]  Will fly on plane in a week    She is holding her RA drugs      Has had hx of gerd that caused similar symptoms  Saw Dr Carmelina Peal in the past -put her on aciphex and other symptoms  She is currently on dexilant  At that time she also had bronchitis   Patient Active Problem List   Diagnosis Date Noted  . Acute bacterial sinusitis 08/29/2013  . Encounter for Medicare annual wellness exam 01/31/2013  . Palpitations 09/14/2012  . Dyspnea 09/14/2012  . Nodule of right lung 09/14/2012  . 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 HYPERLIPIDEMIA 05/08/2009  . 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 ASTHMA 05/08/2009  . GERD 05/08/2009  . MENOPAUSAL DISORDER 05/08/2009  . ARTHRITIS, RHEUMATOID 05/08/2009  . OSTEOPOROSIS 05/08/2009  . SPONDYLOLISTHESIS 05/08/2009  . SLEEP APNEA 05/08/2009   Past Medical History  Diagnosis Date  . Osteoporosis   . Menopausal symptoms   . Postmenopausal HRT (hormone replacement therapy)   . RA (rheumatoid arthritis) 11/2004    methotrexate and Embrel  . Vitamin D deficiency   . Insomnia   . GERD (gastroesophageal reflux disease)   . Asthma   . Fibroid   . Sleep apnea 2008    C-Pap   Past Surgical History  Procedure Laterality Date  . Vaginal delivery      x3  . Cystocele repair  10/2007    with graft  . Abdominal hysterectomy  01/1993    partial with AP repair, secondary to fibroids  . Breast surgery  1991    breast biopsy  . Melanoma excision Right 01/2012    excision right posterior arm  . Nasal sinus surgery   07/1991   History  Substance Use Topics  . Smoking status: Never Smoker   . Smokeless tobacco: Never Used  . Alcohol Use: No   Family History  Problem Relation Age of Onset  . Breast cancer Sister 67  . Thyroid disease Sister    Allergies  Allergen Reactions  . Adalimumab     REACTION: frequent sinus infection  . Diclofenac     unk  . Fosamax [Alendronate Sodium]     Acid reflux  . Methotrexate Derivatives Nausea Only    Oral metotrexate  . Piroxicam     unk  . Sulfamethoxazole-Trimethoprim     REACTION: face and hands rash and felt burning sensation  . Tramadol Hcl     REACTION: nausea   Current Outpatient Prescriptions on File Prior to Visit  Medication Sig Dispense Refill  . acetaminophen (TYLENOL) 325 MG tablet Take 650 mg by mouth every 6 (six) hours as needed.      Marland Kitchen albuterol (PROAIR HFA) 108 (90 BASE) MCG/ACT inhaler Inhale 2 puffs into the lungs every 4 (four) hours as needed.  1 Inhaler  3  . aspirin 81 MG tablet Take 81 mg by mouth every other day.       Marland Kitchen  Calcium Citrate-Vitamin D (CITRACAL/VITAMIN D PO) Take 1 tablet by mouth daily.        . Cholecalciferol (VITAMIN D3) 2000 UNITS TABS Take 1 tablet by mouth daily.        . Estradiol 10 MCG TABS vaginal tablet Place 1 tablet (10 mcg total) vaginally 2 (two) times a week.  24 tablet  3  . fluticasone (FLONASE) 50 MCG/ACT nasal spray Place 2 sprays into both nostrils daily as needed for rhinitis or allergies.  48 g  3  . Folic Acid-Vit U9-NAT F57 (FOLGARD RX) 2.2-25-1 MG TABS Take 1 tablet by mouth daily.        . Multiple Vitamin (MULTIVITAMIN) capsule Take 1 capsule by mouth daily.        . pilocarpine (SALAGEN) 5 MG tablet Take 5 mg by mouth 3 (three) times daily.       . pseudoephedrine-guaifenesin (MUCINEX D) 60-600 MG per tablet Take 1 tablet by mouth every 12 (twelve) hours.      Marland Kitchen zolpidem (AMBIEN) 5 MG tablet Take 2.5-5 mg by mouth at bedtime as needed for sleep.       Marland Kitchen etanercept (ENBREL) 50 MG/ML  injection Inject 50 mg into the skin once a week.       . methotrexate 25 MG/ML SOLN Inject 1 ml under the skin every week        No current facility-administered medications on file prior to visit.    Review of Systems Review of Systems  Constitutional: Negative for fever, appetite change,  and unexpected weight change. pos for fatigue ENTpos for sinus and ear pressure and post nasal drip  Eyes: Negative for pain and visual disturbance.  Respiratory: Negative for wheeze  and shortness of breath.   Cardiovascular: Negative for cp or palpitations    Gastrointestinal: Negative for nausea, diarrhea and constipation.  Genitourinary: Negative for urgency and frequency.  Skin: Negative for pallor or rash   Neurological: Negative for weakness, light-headedness, numbness and headaches.  Hematological: Negative for adenopathy. Does not bruise/bleed easily.  Psychiatric/Behavioral: Negative for dysphoric mood. The patient is not nervous/anxious.         Objective:   Physical Exam  Constitutional: She appears well-developed and well-nourished. No distress.  HENT:  Head: Normocephalic and atraumatic.  Right Ear: External ear normal.  Left Ear: External ear normal.  Mouth/Throat: Oropharynx is clear and moist. No oropharyngeal exudate.  Nares are injected and congested  Bilateral maxillary sinus tenderness   Throat clear  Some clear rhinorrhea   Eyes: Conjunctivae and EOM are normal. Pupils are equal, round, and reactive to light. Right eye exhibits no discharge. Left eye exhibits no discharge. No scleral icterus.  Neck: Normal range of motion. Neck supple. Carotid bruit is not present.  Cardiovascular: Normal rate, regular rhythm and normal heart sounds.   Pulmonary/Chest: Effort normal and breath sounds normal. No respiratory distress. She has no wheezes.  Lymphadenopathy:    She has no cervical adenopathy.  Neurological: She is alert. No cranial nerve deficit.  Skin: Skin is warm and  dry. No rash noted.  Psychiatric: She has a normal mood and affect.          Assessment & Plan:   Problem List Items Addressed This Visit     Respiratory   Acute bacterial sinusitis - Primary   Relevant Orders      CT MAXILLOFACIAL LTD WO CM (Completed)   Recurrent sinusitis     Ongoing in immunocomp pt  S/p  zpack/ augmentin /prednisone Using flonase and nasal saline On cpap  Ordered CT of sinuses       Relevant Orders      CT MAXILLOFACIAL LTD WO CM (Completed)

## 2013-09-16 NOTE — Telephone Encounter (Signed)
Pt notified of CT results and Dr. Marliss Coots comments. Pt said she has seen Dr. Atha Starks in the past but she is willing to go to whatever doctor your prefer, pt wants referral to ENT, I advise pt Marion/Linda will call to schedule appt

## 2013-09-16 NOTE — Progress Notes (Signed)
Pre visit review using our clinic review tool, if applicable. No additional management support is needed unless otherwise documented below in the visit note. 

## 2013-09-16 NOTE — Telephone Encounter (Signed)
CT looks good-no sinus infection or obstruction - I sent her a my chart message  Next- I would go forward with ENT referral- is she ok with that?

## 2013-09-18 NOTE — Telephone Encounter (Signed)
Referral to ENT for chronic sinusitis symptoms with fairly nl recent sinus CT

## 2013-09-18 NOTE — Addendum Note (Signed)
Addended by: Loura Pardon A on: 09/18/2013 12:33 PM   Modules accepted: Orders

## 2013-09-20 ENCOUNTER — Ambulatory Visit (INDEPENDENT_AMBULATORY_CARE_PROVIDER_SITE_OTHER)
Admission: RE | Admit: 2013-09-20 | Discharge: 2013-09-20 | Disposition: A | Discharge status: Home / Self Care | Payer: Medicare Other | Source: Ambulatory Visit | Attending: Internal Medicine | Admitting: Internal Medicine

## 2013-09-20 DIAGNOSIS — R911 Solitary pulmonary nodule: Secondary | ICD-10-CM

## 2013-09-20 DIAGNOSIS — R0609 Other forms of dyspnea: Secondary | ICD-10-CM | POA: Diagnosis not present

## 2013-09-20 DIAGNOSIS — R0989 Other specified symptoms and signs involving the circulatory and respiratory systems: Secondary | ICD-10-CM

## 2013-09-20 DIAGNOSIS — R0600 Dyspnea, unspecified: Secondary | ICD-10-CM

## 2013-09-20 DIAGNOSIS — J984 Other disorders of lung: Secondary | ICD-10-CM | POA: Diagnosis not present

## 2013-09-20 DIAGNOSIS — I2510 Atherosclerotic heart disease of native coronary artery without angina pectoris: Secondary | ICD-10-CM | POA: Diagnosis not present

## 2013-09-21 DIAGNOSIS — H6980 Other specified disorders of Eustachian tube, unspecified ear: Secondary | ICD-10-CM | POA: Diagnosis not present

## 2013-09-21 DIAGNOSIS — Z87898 Personal history of other specified conditions: Secondary | ICD-10-CM | POA: Diagnosis not present

## 2013-09-27 ENCOUNTER — Telehealth: Payer: Self-pay | Admitting: Internal Medicine

## 2013-09-27 DIAGNOSIS — R0600 Dyspnea, unspecified: Secondary | ICD-10-CM

## 2013-09-27 NOTE — Telephone Encounter (Signed)
Ct chest 09/20/13 does not show ILD but she has coronary artery calcification  Please  - refer her to cardiology  - but if she tells you she has had a stress test then set her up for CPST - after she sees cardiology return to see me to regroup; 4-6 weeks or so from now  This is for [name]   THanks  Dr. Brand Males, M.D., [facility].C.P Pulmonary and Critical Care Medicine Staff Physician [place] [facility] Pager: [phone], If no answer or between  15:00h - 7:00h: call [phone]  09/27/2013 8:49 AM

## 2013-09-28 NOTE — Telephone Encounter (Signed)
Called and spoke to pt. Informed pt of results and recs per MR. Pt verbalized understanding and denied any further questions or concerns at this time. Orders placed for cardiology and CPST. Appt made for 10/27 at 9:15am for f/u after cards and CPST. Nothing further needed.

## 2013-09-29 DIAGNOSIS — R5381 Other malaise: Secondary | ICD-10-CM | POA: Diagnosis not present

## 2013-10-04 DIAGNOSIS — J309 Allergic rhinitis, unspecified: Secondary | ICD-10-CM | POA: Diagnosis not present

## 2013-10-04 DIAGNOSIS — L259 Unspecified contact dermatitis, unspecified cause: Secondary | ICD-10-CM | POA: Diagnosis not present

## 2013-10-07 ENCOUNTER — Ambulatory Visit (HOSPITAL_COMMUNITY): Payer: Medicare Other | Attending: Internal Medicine

## 2013-10-07 DIAGNOSIS — R0600 Dyspnea, unspecified: Secondary | ICD-10-CM | POA: Diagnosis not present

## 2013-10-10 ENCOUNTER — Encounter: Payer: Self-pay | Admitting: Physician Assistant

## 2013-10-10 ENCOUNTER — Ambulatory Visit (INDEPENDENT_AMBULATORY_CARE_PROVIDER_SITE_OTHER): Payer: Medicare Other | Admitting: Physician Assistant

## 2013-10-10 VITALS — BP 114/60 | HR 60 | Ht 66.0 in | Wt 177.0 lb

## 2013-10-10 DIAGNOSIS — R0609 Other forms of dyspnea: Secondary | ICD-10-CM | POA: Diagnosis not present

## 2013-10-10 DIAGNOSIS — R0600 Dyspnea, unspecified: Secondary | ICD-10-CM

## 2013-10-10 DIAGNOSIS — E785 Hyperlipidemia, unspecified: Secondary | ICD-10-CM | POA: Diagnosis not present

## 2013-10-10 NOTE — Patient Instructions (Signed)
Your physician has requested that you have en exercise stress myoview. For further information please visit [email or web address]. Please follow instruction sheet, as given.  Your physician recommends that you schedule a follow-up appointment in: 1 month with Dr Tamala Julian  Your physician recommends that you continue on your current medications as directed. Please refer to the Current Medication list given to you today.

## 2013-10-10 NOTE — Assessment & Plan Note (Signed)
Patient has new dyspnea on exertion. She had a normal stress test and 2-D echo 1 year ago. Recent CT scan showed atherosclerosis in the LAD but was not gated. CPX with suggestive of mild circulatory limitation at peak exercise. I discussed this patient in detail with Dr. Tamala Julian who agrees that she should have a stress Myoview.

## 2013-10-10 NOTE — Assessment & Plan Note (Signed)
Recent cholesterol 227, HDL of 62 LDL of 144. Followed by primary

## 2013-10-10 NOTE — Assessment & Plan Note (Signed)
No recent palpitations. 

## 2013-10-10 NOTE — Progress Notes (Signed)
HPI: This is a 68 year old female patient of Dr. Tamala Julian who he has seen in the past for palpitations in 2014. She had a stress test 10/05/12 that was normal with only occasional PVCs. 2-D echo showed normal systolic function EF 75-64% with mild AI, mild MR. She recently saw pulmonary for dyspnea that showed moderate air trapping indicative of small airways disease but also atherosclerosis including LAD disease, but was not gated. CPX suggestive of a mild circulatory limitation at peak exercise.  Patient states she gets out of breath if she overexerts herself, such as carrying her grandchildren up a flight of stairs when she is in a hurry or tearing her kayak equipment up a steep Hill. She does exercise with walking and water aerobics without difficulty. Cardiac risk factors are positive for hypertension. She has no history of hypertension, diabetes mellitus, has never smoked and no family history of CAD. She denies any chest pain, palpitations, dyspnea at rest, dizziness or presyncope.   Allergies  Allergen Reactions  . Adalimumab     REACTION: frequent sinus infection  . Diclofenac     unk  . Fosamax [Alendronate Sodium]     Acid reflux  . Methotrexate Derivatives Nausea Only    Oral metotrexate  . Piroxicam     unk  . Sulfamethoxazole-Trimethoprim     REACTION: face and hands rash and felt burning sensation  . Tramadol Hcl     REACTION: nausea     Current Outpatient Prescriptions  Medication Sig Dispense Refill  . acetaminophen (TYLENOL) 325 MG tablet Take 650 mg by mouth every 6 (six) hours as needed.      Marland Kitchen albuterol (PROAIR HFA) 108 (90 BASE) MCG/ACT inhaler Inhale 2 puffs into the lungs every 4 (four) hours as needed.  1 Inhaler  3  . aspirin 81 MG tablet Take 81 mg by mouth every other day.       . Calcium Citrate-Vitamin D (CITRACAL/VITAMIN D PO) Take 1 tablet by mouth daily.        . Cholecalciferol (VITAMIN D3) 2000 UNITS TABS Take 1 tablet by mouth daily.        Marland Kitchen  Dexlansoprazole (DEXILANT) 30 MG capsule Take 30 mg by mouth daily.      . Estradiol 10 MCG TABS vaginal tablet Place 1 tablet (10 mcg total) vaginally 2 (two) times a week.  24 tablet  3  . etanercept (ENBREL) 50 MG/ML injection Inject 50 mg into the skin once a week.       . fluticasone (FLONASE) 50 MCG/ACT nasal spray Place 2 sprays into both nostrils daily as needed for rhinitis or allergies.  48 g  3  . Folic Acid-Vit P3-IRJ J88 (FOLGARD RX) 2.2-25-1 MG TABS Take 1 tablet by mouth daily.        . methotrexate 25 MG/ML SOLN Inject 1 ml under the skin every week       . Multiple Vitamin (MULTIVITAMIN) capsule Take 1 capsule by mouth daily.        . pilocarpine (SALAGEN) 5 MG tablet Take 5 mg by mouth 3 (three) times daily.       . pseudoephedrine-guaifenesin (MUCINEX D) 60-600 MG per tablet Take 1 tablet by mouth every 12 (twelve) hours.      Marland Kitchen zolpidem (AMBIEN) 5 MG tablet Take 2.5-5 mg by mouth at bedtime as needed for sleep.        No current facility-administered medications for this visit.    Past  Medical History  Diagnosis Date  . Osteoporosis   . Menopausal symptoms   . Postmenopausal HRT (hormone replacement therapy)   . RA (rheumatoid arthritis) 11/2004    methotrexate and Embrel  . Vitamin D deficiency   . Insomnia   . GERD (gastroesophageal reflux disease)   . Asthma   . Fibroid   . Sleep apnea 2008    C-Pap    Past Surgical History  Procedure Laterality Date  . Vaginal delivery      x3  . Cystocele repair  10/2007    with graft  . Abdominal hysterectomy  01/1993    partial with AP repair, secondary to fibroids  . Breast surgery  1991    breast biopsy  . Melanoma excision Right 01/2012    excision right posterior arm  . Nasal sinus surgery  07/1991    Family History  Problem Relation Age of Onset  . Breast cancer Sister 24  . Thyroid disease Sister     History   Social History  . Marital Status: Married    Spouse Name: N/A    Number of Children: N/A    . Years of Education: N/A   Occupational History  . housewife    Social History Main Topics  . Smoking status: Never Smoker   . Smokeless tobacco: Never Used  . Alcohol Use: No  . Drug Use: No  . Sexual Activity: Yes    Birth Control/ Protection: Surgical     Comment: TAH   Other Topics Concern  . Not on file   Social History Narrative  . No narrative on file    ROS: Symptoms from rheumatoid arthritis, reflux symptoms with terrible taste in her mouth but no chest pain, recent sinus infection with ringing in her ears. See history of present illness otherwise negative  LMP 01/06/1993 BP 114/60  Pulse 60  Ht 5\' 6"  (1.676 m)  Wt 177 lb (80.287 kg)  BMI 28.58 kg/m2  LMP 01/06/1993   PHYSICAL EXAM: Well-nournished, in no acute distress. Neck: No JVD, HJR, Bruit, or thyroid enlargement  Lungs: No tachypnea, clear without wheezing, rales, or rhonchi  Cardiovascular: RRR, PMI not displaced, 6-3/8 systolic murmur at the apex, no gallops, bruit, thrill, or heave.  Abdomen: BS normal. Soft without organomegaly, masses, lesions or tenderness.  Extremities: without cyanosis, clubbing or edema. Good distal pulses bilateral  SKin: Warm, no lesions or rashes   Musculoskeletal: No deformities  Neuro: no focal signs   Wt Readings from Last 3 Encounters:  09/16/13 173 lb 8 oz (78.699 kg)  09/08/13 171 lb 8 oz (77.792 kg)  08/29/13 177 lb (80.287 kg)     EKG: Normal sinus rhythm with poor R wave progression, no acute change

## 2013-10-18 ENCOUNTER — Ambulatory Visit (HOSPITAL_COMMUNITY): Payer: Medicare Other | Attending: Interventional Cardiology | Admitting: Radiology

## 2013-10-18 VITALS — BP 136/83 | HR 62 | Ht 66.0 in | Wt 174.0 lb

## 2013-10-18 DIAGNOSIS — R0609 Other forms of dyspnea: Secondary | ICD-10-CM

## 2013-10-18 DIAGNOSIS — I10 Essential (primary) hypertension: Secondary | ICD-10-CM | POA: Diagnosis not present

## 2013-10-18 DIAGNOSIS — E785 Hyperlipidemia, unspecified: Secondary | ICD-10-CM

## 2013-10-18 DIAGNOSIS — R0600 Dyspnea, unspecified: Secondary | ICD-10-CM

## 2013-10-18 MED ORDER — TECHNETIUM TC 99M SESTAMIBI GENERIC - CARDIOLITE
10.0000 | Freq: Once | INTRAVENOUS | Status: AC | PRN
  Administered 2013-10-18: 10 via INTRAVENOUS

## 2013-10-18 MED ORDER — TECHNETIUM TC 99M SESTAMIBI GENERIC - CARDIOLITE
30.0000 | Freq: Once | INTRAVENOUS | Status: AC | PRN
  Administered 2013-10-18: 30 via INTRAVENOUS

## 2013-10-18 NOTE — Progress Notes (Signed)
[facility] [address] [place], [place] [address] [phone]    Cardiology Nuclear Med Study  Deanna Norris is a 68 y.o. female     MRN : [id number]     DOB: Jul 02, 1945  Procedure Date: 10/18/2013  Nuclear Med Background Indication for Stress Test:  Evaluation for Ischemia, and 09-20-2013 CT Chest: Atherosclerosis LAD History:  Asthma (only with bronchitis) Cardiac Risk Factors: Hypertension  Symptoms:  DOE   Nuclear Pre-Procedure Caffeine/Decaff Intake:  None> 12 hrs NPO After: 7:00pm   Lungs:  clear O2 Sat: 92% on room air. IV 0.9% NS with Angio Cath:  22g  IV Site: R Antecubital x 1, tolerated well IV Started by:  Irven Baltimore, RN  Chest Size (in):  38 Cup Size: C  Height: 5\' 6"  (1.676 m)  Weight:  174 lb (78.926 kg)  BMI:  Body mass index is 28.1 kg/(m^2). Tech Comments:  N/A    Nuclear Med Study 1 or 2 day study: 1 day  Stress Test Type:  Stress  Reading MD: N/A  Order Authorizing Provider:  Olen Pel, MD, and Ermalinda Barrios, PAC.  Resting Radionuclide: Technetium 47m Sestamibi  Resting Radionuclide Dose: 11.0 mCi   Stress Radionuclide:  Technetium 50m Sestamibi  Stress Radionuclide Dose: 33.0 mCi           Stress Protocol Rest HR: 62 Stress HR: 137  Rest BP: 136/83 Stress BP: 199/93  Exercise Time (min): 7:45 METS: 9.7           Dose of Adenosine (mg):  n/a Dose of Lexiscan: n/a mg  Dose of Atropine (mg): n/a Dose of Dobutamine: n/a mcg/kg/min (at max HR)  Stress Test Technologist: Glade Lloyd, BS-ES  Nuclear Technologist:  Earl Many, CNMT     Rest Procedure:  Myocardial perfusion imaging was performed at rest 45 minutes following the intravenous administration of Technetium 63m Sestamibi. Rest ECG: NSR with non-specific ST-T wave changes  Stress Procedure:  The patient exercised on the treadmill utilizing the Bruce Protocol for 7:45 minutes. The patient stopped due to SOB and denied any chest pain.   Technetium 41m Sestamibi was injected at peak exercise and myocardial perfusion imaging was performed after a brief delay. Stress ECG: No significant ST segment change suggestive of ischemia.  QPS Raw Data Images:  Acquisition technically good; normal left ventricular size. Stress Images:  Normal homogeneous uptake in all areas of the myocardium. Rest Images:  Normal homogeneous uptake in all areas of the myocardium. Subtraction (SDS):  No evidence of ischemia. Transient Ischemic Dilatation (Normal <1.22):  0.86 Lung/Heart Ratio (Normal <0.45):  0.26  Quantitative Gated Spect Images QGS EDV:  70 ml QGS ESV:  19 ml  Impression Exercise Capacity:  Fair exercise capacity. BP Response:  Hypertensive blood pressure response. Clinical Symptoms:  There is dyspnea. ECG Impression:  No significant ST segment change suggestive of ischemia. Comparison with Prior Nuclear Study: No images to compare  Overall Impression:  Normal stress nuclear study.  LV Ejection Fraction: 73%.  LV Wall Motion:  NL LV Function; NL Wall Motion  Kirk Ruths

## 2013-10-26 DIAGNOSIS — Z23 Encounter for immunization: Secondary | ICD-10-CM | POA: Diagnosis not present

## 2013-11-01 ENCOUNTER — Encounter: Payer: Self-pay | Admitting: Internal Medicine

## 2013-11-01 ENCOUNTER — Ambulatory Visit: Payer: Medicare Other | Admitting: Internal Medicine

## 2013-11-01 ENCOUNTER — Ambulatory Visit (INDEPENDENT_AMBULATORY_CARE_PROVIDER_SITE_OTHER): Payer: Medicare Other | Admitting: Internal Medicine

## 2013-11-01 VITALS — BP 130/88 | HR 68 | Ht 66.0 in | Wt 181.0 lb

## 2013-11-01 DIAGNOSIS — R0600 Dyspnea, unspecified: Secondary | ICD-10-CM | POA: Diagnosis not present

## 2013-11-01 DIAGNOSIS — L259 Unspecified contact dermatitis, unspecified cause: Secondary | ICD-10-CM | POA: Diagnosis not present

## 2013-11-01 NOTE — Patient Instructions (Addendum)
#  Shortness of breath  - most likely due to diastolic dysfunction based on CPST result and normal nuclear medicine result  - please talk to Dr Tamala Julian about this; I will send a message to him as well   #Followup   - as needed

## 2013-11-01 NOTE — Progress Notes (Signed)
   Subjective:    Patient ID: Deanna Norris, female    DOB: 25-Sep-1945, 68 y.o.   MRN: [id number]  HPI    OV 11/01/2013  Chief Complaint  Patient presents with  . Follow-up    Pt here after cards referral and CPST. Pt c/o dyspnea only with over exertion. Pt denies cough and CP   Follow-up dyspnea evaluation after cardiopulmonary stress testing. She underwent cardiopulmonary stress testing on 10/10/2013  Results show maximal effort but the VO2 max of 108% predicted and a normal anaerobic threshold. Her ventilator parameters were normal. She appropriately reached heart rate max with a normal heart rate reserve at peak exercise. However the O2 pulse which is a surrogate for stroke volume was flat during the second half of exercise. This is classically consistent with mild diastolic dysfunction especially in the context of a normal nuclear medicine cardiac stress test. There was no evidence of exercise induced bronchospasm.  She has coronary artery calcification and she underwent a nuclear medicine cardiac stress test 10/19/2013 that has been reported as normal  Results were shared with her  Review of Systems  Constitutional: Negative for fever and unexpected weight change.  HENT: Negative for congestion, dental problem, ear pain, nosebleeds, postnasal drip, rhinorrhea, sinus pressure, sneezing, sore throat and trouble swallowing.   Eyes: Negative for redness and itching.  Respiratory: Positive for shortness of breath. Negative for cough, chest tightness and wheezing.   Cardiovascular: Negative for palpitations and leg swelling.  Gastrointestinal: Negative for nausea and vomiting.  Genitourinary: Negative for dysuria.  Musculoskeletal: Negative for joint swelling.  Skin: Negative for rash.  Neurological: Negative for headaches.  Hematological: Does not bruise/bleed easily.  Psychiatric/Behavioral: Negative for dysphoric mood. The patient is not nervous/anxious.          Objective:   Physical Exam  Filed Vitals:   11/01/13 1140  BP: 130/88  Pulse: 68  Height: 5\' 6"  (1.676 m)  Weight: 181 lb (82.101 kg)  SpO2: 98%   discusion only visit      Assessment & Plan:  Cardiopulmonary stress testing suggests diastolic dysfunction is most likely etiology for dyspnea but broadly speaking this is susceptible to limitation. She has a follow-up with Dr. Pernell Dupre in a few weeks and I've asked her to discuss this with him. I also asked her to lose weight given the fact her BMI is Body mass index is 29.23 kg/(m^2). She will follow-up here as needed   (> 50% of this 15 min visit spent in face to face counseling)   Dr. Brand Males, M.D., [facility].C.P Pulmonary and Critical Care Medicine Staff Physician [place] [facility] Pager: [phone], If no answer or between  15:00h - 7:00h: call [phone]  11/03/2013 3:26 AM

## 2013-11-07 ENCOUNTER — Encounter: Payer: Self-pay | Admitting: Internal Medicine

## 2013-11-17 ENCOUNTER — Encounter: Payer: Self-pay | Admitting: Interventional Cardiology

## 2013-11-17 ENCOUNTER — Ambulatory Visit (INDEPENDENT_AMBULATORY_CARE_PROVIDER_SITE_OTHER): Payer: Medicare Other | Admitting: Interventional Cardiology

## 2013-11-17 VITALS — BP 128/80 | HR 64 | Ht 66.0 in | Wt 175.0 lb

## 2013-11-17 DIAGNOSIS — E785 Hyperlipidemia, unspecified: Secondary | ICD-10-CM | POA: Diagnosis not present

## 2013-11-17 DIAGNOSIS — I2510 Atherosclerotic heart disease of native coronary artery without angina pectoris: Secondary | ICD-10-CM | POA: Insufficient documentation

## 2013-11-17 DIAGNOSIS — G4730 Sleep apnea, unspecified: Secondary | ICD-10-CM | POA: Diagnosis not present

## 2013-11-17 DIAGNOSIS — R0600 Dyspnea, unspecified: Secondary | ICD-10-CM

## 2013-11-17 DIAGNOSIS — Z79899 Other long term (current) drug therapy: Secondary | ICD-10-CM | POA: Diagnosis not present

## 2013-11-17 NOTE — Patient Instructions (Signed)
Your physician recommends that you continue on your current medications as directed. Please refer to the Current Medication list given to you today.   Your physician recommends that you schedule a follow-up appointment as needed  

## 2013-11-17 NOTE — Progress Notes (Signed)
Patient ID: [name], female   DOB: 1945-11-09, 68 y.o.   MRN: [id number]    [address], [place]  [address] Phone: [phone] Fax:  [phone]  Date:  11/17/2013   ID:  Anoushka, Divito 12-15-1945, MRN [id number]  PCP:  Loura Pardon, MD   ASSESSMENT:  1. LAD calcification on high-resolution chest CT. Recent negative myocardial stress perfusion study for evidence of ischemia 2. Hyperlipidemia 3. Dyspnea on exertion 4. Sleep apnea  PLAN:  1. 81 mg of aspirin daily 2. Consider primary prevention with statin therapy for hyperlipidemia. She will discuss with primary care 3. No further cardiac evaluation is needed   SUBJECTIVE: [name] is a 68 y.o. female who is doing well. She has some poorly characterizes exertional dyspnea. She had a cardiopulmonary stress test that was normal. A high-resolution chest CT looking for interstitial lung disease, identify calcium in the LAD. She performed a stress Cardiolite study that was negative for evidence of ischemia. She has no chest discomfort or symptoms of angina. She experiences more dyspnea than other friends that she exercises with. This is a concern for her.   Wt Readings from Last 3 Encounters:  11/17/13 175 lb (79.379 kg)  11/01/13 181 lb (82.101 kg)  10/18/13 174 lb (78.926 kg)     Past Medical History  Diagnosis Date  . Osteoporosis   . Menopausal symptoms   . Postmenopausal HRT (hormone replacement therapy)   . RA (rheumatoid arthritis) 11/2004    methotrexate and Embrel  . Vitamin D deficiency   . Insomnia   . GERD (gastroesophageal reflux disease)   . Asthma   . Fibroid   . Sleep apnea 2008    C-Pap    Current Outpatient Prescriptions  Medication Sig Dispense Refill  . acetaminophen (TYLENOL) 325 MG tablet Take 650 mg by mouth every 6 (six) hours as needed.    Marland Kitchen albuterol (PROAIR HFA) 108 (90 BASE) MCG/ACT inhaler Inhale 2 puffs into the lungs every 4 (four)  hours as needed. 1 Inhaler 3  . aspirin 81 MG tablet Take 81 mg by mouth every other day.     . Calcium Citrate-Vitamin D (CITRACAL/VITAMIN D PO) Take 1 tablet by mouth daily.      . Cholecalciferol (VITAMIN D3) 2000 UNITS TABS Take 1 tablet by mouth daily.      Marland Kitchen doxycycline (DORYX) 100 MG DR capsule Take 50 mg by mouth 2 (two) times daily.    . Estradiol 10 MCG TABS vaginal tablet Place 1 tablet (10 mcg total) vaginally 2 (two) times a week. 24 tablet 3  . etanercept (ENBREL) 50 MG/ML injection Inject 50 mg into the skin once a week.     . fluticasone (FLONASE) 50 MCG/ACT nasal spray Place 2 sprays into both nostrils daily as needed for rhinitis or allergies. 48 g 3  . Folic Acid-Vit W2-BJS E83 (FOLGARD RX) 2.2-25-1 MG TABS Take 1 tablet by mouth daily.      . methotrexate 25 MG/ML SOLN Inject 1 ml under the skin every week     . metroNIDAZOLE (METROCREAM) 0.75 % cream Apply topically 2 (two) times daily. Facial application.    . Multiple Vitamin (MULTIVITAMIN) capsule Take 1 capsule by mouth daily.      . pilocarpine (SALAGEN) 5 MG tablet Take 5 mg by mouth 3 (three) times daily.     . pseudoephedrine-guaifenesin (MUCINEX D) 60-600 MG per tablet Take 1 tablet by mouth  every 12 (twelve) hours. As needed    . RABEprazole (ACIPHEX) 20 MG tablet Take 20 mg by mouth daily.    . ranitidine (ZANTAC) 300 MG tablet Take 300 mg by mouth at bedtime.    Marland Kitchen zolpidem (AMBIEN) 5 MG tablet Take 2.5-5 mg by mouth at bedtime as needed for sleep.      No current facility-administered medications for this visit.    Allergies:    Allergies  Allergen Reactions  . Adalimumab     REACTION: frequent sinus infection  . Diclofenac     unk  . Fosamax [Alendronate Sodium]     Acid reflux  . Methotrexate Derivatives Nausea Only    Oral metotrexate  . Piroxicam     unk  . Sulfamethoxazole-Trimethoprim     REACTION: face and hands rash and felt burning sensation  . Tramadol Hcl     REACTION: nausea     Social History:  The patient  reports that she has never smoked. She has never used smokeless tobacco. She reports that she does not drink alcohol or use illicit drugs.   ROS:  Please see the history of present illness.   No orthopnea, PND, edema, claudication, or other complaints   All other systems reviewed and negative.   OBJECTIVE: VS:  BP 128/80 mmHg  Pulse 64  Ht 5\' 6"  (1.676 m)  Wt 175 lb (79.379 kg)  BMI 28.26 kg/m2  LMP 01/06/1993 Well nourished, well developed, in no acute distress, compatible with age 59: normal Neck: JVD flat. Carotid bruit absent  Cardiac:  normal S1, S2; RRR; no murmur Lungs:  clear to auscultation bilaterally, no wheezing, rhonchi or rales Abd: soft, nontender, no hepatomegaly Ext: Edema absent. Pulses 2+ and symmetric Skin: warm and dry Neuro:  CNs 2-12 intact, no focal abnormalities noted  EKG:  Not performed       Signed, Illene Labrador III, MD 11/17/2013 12:27 PM

## 2013-12-06 DIAGNOSIS — J309 Allergic rhinitis, unspecified: Secondary | ICD-10-CM | POA: Diagnosis not present

## 2013-12-06 DIAGNOSIS — L259 Unspecified contact dermatitis, unspecified cause: Secondary | ICD-10-CM | POA: Diagnosis not present

## 2013-12-09 DIAGNOSIS — Z1231 Encounter for screening mammogram for malignant neoplasm of breast: Secondary | ICD-10-CM | POA: Diagnosis not present

## 2013-12-09 DIAGNOSIS — Z803 Family history of malignant neoplasm of breast: Secondary | ICD-10-CM | POA: Diagnosis not present

## 2013-12-09 DIAGNOSIS — M810 Age-related osteoporosis without current pathological fracture: Secondary | ICD-10-CM | POA: Diagnosis not present

## 2013-12-09 LAB — HM DEXA SCAN

## 2013-12-16 ENCOUNTER — Encounter: Payer: Self-pay | Admitting: Family Medicine

## 2013-12-20 ENCOUNTER — Telehealth: Payer: Self-pay | Admitting: Nurse Practitioner

## 2013-12-20 ENCOUNTER — Encounter: Payer: Self-pay | Admitting: Family Medicine

## 2013-12-20 NOTE — Telephone Encounter (Signed)
Let patient know theta BMD done on 12/09/13 shows the T Score: at the spine is -1.4; left hip at -2.1; right hip at -2.6.  Comparison with previous exam 08/20/2009 there has been a decrease of the left hip of 6.8 %.  Her scores put her in the lower Osteopenic range with the lowest range at the right hip.  The study shows stability for the spine and no comparison data for the right hip. She has been on Actonel from 2004-2006 & 2008-  2010.  The FRAX score is not calculated because of this.  Because of her history of RA and taking Methotrexate she is at higher risk of bone loss.  She must exercise and continue with calcium and Vit D.  Repeat again in 2 years.  At this time OK to remain off Actonel unless Rheumatologist thinks otherwise.  She has problems with GERD and is easily flared with Actonel.

## 2013-12-22 ENCOUNTER — Ambulatory Visit (INDEPENDENT_AMBULATORY_CARE_PROVIDER_SITE_OTHER): Payer: Medicare Other | Admitting: Internal Medicine

## 2013-12-22 ENCOUNTER — Encounter: Payer: Self-pay | Admitting: Internal Medicine

## 2013-12-22 VITALS — BP 114/74 | HR 77 | Temp 98.4°F | Wt 176.0 lb

## 2013-12-22 DIAGNOSIS — J329 Chronic sinusitis, unspecified: Secondary | ICD-10-CM

## 2013-12-22 DIAGNOSIS — B349 Viral infection, unspecified: Secondary | ICD-10-CM

## 2013-12-22 DIAGNOSIS — I2510 Atherosclerotic heart disease of native coronary artery without angina pectoris: Secondary | ICD-10-CM

## 2013-12-22 DIAGNOSIS — B9789 Other viral agents as the cause of diseases classified elsewhere: Secondary | ICD-10-CM

## 2013-12-22 MED ORDER — PREDNISONE 10 MG PO TABS: ORAL_TABLET | ORAL | Status: DC

## 2013-12-22 NOTE — Patient Instructions (Signed)

## 2013-12-22 NOTE — Progress Notes (Signed)
HPI  Pt presents to the clinic today with c/o headache, facial pain and pressure, ear fullness, sore throat and cough. She reports these symptoms have been going on for the last month. She is not blowing anything out of her nose. Her cough is productive but she does not look a the mucous. She denies fever or chills but has felt fatigued. She has tried Mucinex and Nuquil without much relief. She does have a history of asthma and recurrent sinusitis. Dr. Glori Bickers pan scanned her sinuses 09/2013 which did not show any thickening or chronic infection. She was referred to ENT at that time. She did see ENT and they told her that she did not have a problem with her sinuses but that she needed to have her hearing tested.  Review of Systems    Past Medical History  Diagnosis Date  . Osteoporosis   . Menopausal symptoms   . Postmenopausal HRT (hormone replacement therapy)   . RA (rheumatoid arthritis) 11/2004    methotrexate and Embrel  . Vitamin D deficiency   . Insomnia   . GERD (gastroesophageal reflux disease)   . Asthma   . Fibroid   . Sleep apnea 2008    C-Pap    Family History  Problem Relation Age of Onset  . Breast cancer Sister 14  . Thyroid disease Sister     History   Social History  . Marital Status: Married    Spouse Name: N/A    Number of Children: N/A  . Years of Education: N/A   Occupational History  . housewife    Social History Main Topics  . Smoking status: Never Smoker   . Smokeless tobacco: Never Used  . Alcohol Use: No  . Drug Use: No  . Sexual Activity: Yes    Birth Control/ Protection: Surgical     Comment: TAH   Other Topics Concern  . Not on file   Social History Narrative    Allergies  Allergen Reactions  . Adalimumab     REACTION: frequent sinus infection  . Diclofenac     unk  . Doxycycline Hyclate Other (See Comments)    "turns teeth gray"  . Fosamax [Alendronate Sodium]     Acid reflux  . Methotrexate Derivatives Nausea Only    Oral  metotrexate  . Piroxicam     unk  . Sulfamethoxazole-Trimethoprim     REACTION: face and hands rash and felt burning sensation  . Tramadol Hcl     REACTION: nausea     Constitutional: Positive headache, fatigue. Denies fever or  abrupt weight changes.  HEENT:  Positive facial pain, nasal congestion and sore throat. Denies eye redness, ear pain, ringing in the ears, wax buildup, runny nose or bloody nose. Respiratory: Positive cough. Denies difficulty breathing or shortness of breath.  Cardiovascular: Denies chest pain, chest tightness, palpitations or swelling in the hands or feet.   No other specific complaints in a complete review of systems (except as listed in HPI above).  Objective:  BP 114/74 mmHg  Pulse 77  Temp(Src) 98.4 F (36.9 C) (Oral)  Wt 176 lb (79.833 kg)  SpO2 98%  LMP 01/06/1993   General: Appears her stated age, well developed, well nourished in NAD. HEENT: Head: normal shape and size, maxillary sinus tenderness noted; Eyes: sclera white, no icterus, conjunctiva pink; Ears: Tm's gray and intact, normal light reflex; Nose: mucosa pink and moist, septum midline; Throat/Mouth: + PND. Teeth present, mucosa erythematous and moist, no exudate noted,  no lesions or ulcerations noted.  Neck: Cervical adenopathy noted Cardiovascular: Normal rate and rhythm. S1,S2 noted.  No murmur, rubs or gallops noted.  Pulmonary/Chest: Normal effort and positive vesicular breath sounds. No respiratory distress. No wheezes, rales or ronchi noted.      Assessment & Plan:   Viral sinusitis  Can use a Neti Pot which can be purchased from your local drug store. Continue your Flonase Will treat the inflammation with prednisone x 6 days- she is not currently on her Enbrel or MTX If no improvement or feeling worse by next week, will consider antibiotic treatment  RTC as needed or if symptoms persist.

## 2013-12-22 NOTE — Progress Notes (Signed)
Pre visit review using our clinic review tool, if applicable. No additional management support is needed unless otherwise documented below in the visit note. 

## 2013-12-23 ENCOUNTER — Telehealth: Payer: Self-pay | Admitting: Family Medicine

## 2013-12-23 DIAGNOSIS — R0600 Dyspnea, unspecified: Secondary | ICD-10-CM | POA: Diagnosis not present

## 2013-12-23 NOTE — Telephone Encounter (Signed)
Pt is aware as instructed below

## 2013-12-23 NOTE — Telephone Encounter (Signed)
Pt called  Pt is having a hard time sleeping due to her coughing at night.  She has been using niqual.   She wanted to know what regaina would recommend to help her sleep at night  [facility]

## 2013-12-23 NOTE — Telephone Encounter (Signed)
I would use Delsym for cough. If that doesn't work can try a cough syrup with codeine

## 2013-12-27 ENCOUNTER — Ambulatory Visit (INDEPENDENT_AMBULATORY_CARE_PROVIDER_SITE_OTHER): Payer: Medicare Other | Admitting: Family Medicine

## 2013-12-27 ENCOUNTER — Encounter: Payer: Self-pay | Admitting: Family Medicine

## 2013-12-27 VITALS — BP 122/82 | HR 75 | Temp 97.8°F | Ht 66.0 in | Wt 174.0 lb

## 2013-12-27 DIAGNOSIS — J019 Acute sinusitis, unspecified: Secondary | ICD-10-CM | POA: Diagnosis not present

## 2013-12-27 DIAGNOSIS — I2510 Atherosclerotic heart disease of native coronary artery without angina pectoris: Secondary | ICD-10-CM

## 2013-12-27 DIAGNOSIS — B9689 Other specified bacterial agents as the cause of diseases classified elsewhere: Secondary | ICD-10-CM

## 2013-12-27 MED ORDER — AMOXICILLIN-POT CLAVULANATE 875-125 MG PO TABS: 1.0000 | ORAL_TABLET | Freq: Two times a day (BID) | ORAL | Status: DC

## 2013-12-27 NOTE — Progress Notes (Signed)
Pre visit review using our clinic review tool, if applicable. No additional management support is needed unless otherwise documented below in the visit note. 

## 2013-12-27 NOTE — Patient Instructions (Signed)
I think you may have another sinus infection (bacterial)  Take the augmentin as directed  Drink lots of fluids/ mucinex may loosen things up  Continue nasal saline  Try zyrtec 10 mg daily in evening Continue flonase once daily    Update if not starting to improve in a week or if worsening

## 2013-12-27 NOTE — Progress Notes (Signed)
Subjective:    Patient ID: [name], female    DOB: 11-Nov-1945, 68 y.o.   MRN: [id number]  HPI Here for ongoing sinus symptoms since the summer   Started in aug as a cold  Developed sinusitis adn given zpack from dentist after seeing evidence of infx on R in xray  Then given augmentin from primary care and then prednisone  CT of sinuses was clear with no evidence of sinusitis  ENT suggested tinnitus   Developed tinnitus  Crusty weepy eyes and facial irritation  Very swollen nasal passages and headaches   9/15 Dr Neldon Mc dx her with rosacea - given 50 mg of doxy daily and metro cream  Also tx for gerd -adding zantac 300 at night  Stopped chocolate and cafffeine  Rosacea got worse- doxy dose doubled and Gerd improved (tinnitus stopped), put on prednisone  11/15 -developed a mild cold - held RA meds  Saw Dr Neldon Mc - improved roseacea -- stopped doxy which made her teeth look grey   Then put on prednisone by regina 12/17 Last dose today Improved  Remaining symptoms = residual cough and head pressure ,raspy voice and occ yellow drainage    Right now - rosacea seems to be improving  (? If prednisone helped that too)   Face continues to be very sensitive  Is now having sinus pain  Coughing up some phlegm that is slightly yellow  No fever    Had sinus surgery - 20 years ago   GERd is improved off chocolate and caffeine  Is starting to back off on raniditine at night   Has all rhinitis and also vasomotor rhinitis   She has an artificial tree -no live tree in the house    Patient Active Problem List   Diagnosis Date Noted  . Coronary artery calcification seen on CT scan 11/17/2013  . Recurrent sinusitis 09/16/2013  . Acute bacterial sinusitis 08/29/2013  . Encounter for Medicare annual wellness exam 01/31/2013  . Dyspnea 09/14/2012  . Nodule of right lung 09/14/2012  . 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 Hyperlipidemia 05/08/2009  . 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  ASTHMA 05/08/2009  . GERD 05/08/2009  . MENOPAUSAL DISORDER 05/08/2009  . ARTHRITIS, RHEUMATOID 05/08/2009  . OSTEOPOROSIS 05/08/2009  . SPONDYLOLISTHESIS 05/08/2009  . Sleep apnea 05/08/2009   Past Medical History  Diagnosis Date  . Osteoporosis   . Menopausal symptoms   . Postmenopausal HRT (hormone replacement therapy)   . RA (rheumatoid arthritis) 11/2004    methotrexate and Embrel  . Vitamin D deficiency   . Insomnia   . GERD (gastroesophageal reflux disease)   . Asthma   . Fibroid   . Sleep apnea 2008    C-Pap   Past Surgical History  Procedure Laterality Date  . Vaginal delivery      x3  . Cystocele repair  10/2007    with graft  . Abdominal hysterectomy  01/1993    partial with AP repair, secondary to fibroids  . Breast surgery  1991    breast biopsy  . Melanoma excision Right 01/2012    excision right posterior arm  . Nasal sinus surgery  07/1991   History  Substance Use Topics  . Smoking status: Never Smoker   . Smokeless tobacco: Never Used  . Alcohol Use: No   Family History  Problem Relation Age of Onset  . Breast cancer Sister 20  . Thyroid disease Sister    Allergies  Allergen Reactions  .  Adalimumab     REACTION: frequent sinus infection  . Diclofenac     unk  . Doxycycline Hyclate Other (See Comments)    "turns teeth gray"  . Fosamax [Alendronate Sodium]     Acid reflux  . Methotrexate Derivatives Nausea Only    Oral metotrexate  . Piroxicam     unk  . Sulfamethoxazole-Trimethoprim     REACTION: face and hands rash and felt burning sensation  . Tramadol Hcl     REACTION: nausea   Current Outpatient Prescriptions on File Prior to Visit  Medication Sig Dispense Refill  . acetaminophen (TYLENOL) 325 MG tablet Take 650 mg by mouth every 6 (six) hours as needed.    Marland Kitchen albuterol (PROAIR HFA) 108 (90 BASE) MCG/ACT inhaler Inhale 2 puffs into the lungs every 4 (four) hours as needed. 1 Inhaler 3  . aspirin 81 MG tablet Take 81 mg by mouth  every other day.     . Calcium Citrate-Vitamin D (CITRACAL/VITAMIN D PO) Take 1 tablet by mouth daily.      . Cholecalciferol (VITAMIN D3) 2000 UNITS TABS Take 1 tablet by mouth daily.      . Estradiol 10 MCG TABS vaginal tablet Place 1 tablet (10 mcg total) vaginally 2 (two) times a week. 24 tablet 3  . etanercept (ENBREL) 50 MG/ML injection Inject 50 mg into the skin once a week.     . fluticasone (FLONASE) 50 MCG/ACT nasal spray Place 2 sprays into both nostrils daily as needed for rhinitis or allergies. 48 g 3  . Folic Acid-Vit J9-ERD E08 (FOLGARD RX) 2.2-25-1 MG TABS Take 1 tablet by mouth daily.      . hydrocortisone 2.5 % ointment Apply 1 application topically 2 (two) times daily.    . methotrexate 25 MG/ML SOLN Inject 1 ml under the skin every week     . Multiple Vitamin (MULTIVITAMIN) capsule Take 1 capsule by mouth daily.      . pilocarpine (SALAGEN) 5 MG tablet Take 5 mg by mouth 3 (three) times daily.     . pseudoephedrine-guaifenesin (MUCINEX D) 60-600 MG per tablet Take 1 tablet by mouth every 12 (twelve) hours. As needed    . RABEprazole (ACIPHEX) 20 MG tablet Take 20 mg by mouth daily.    . ranitidine (ZANTAC) 300 MG tablet Take 300 mg by mouth at bedtime.    Marland Kitchen zolpidem (AMBIEN) 5 MG tablet Take 2.5-5 mg by mouth at bedtime as needed for sleep.      No current facility-administered medications on file prior to visit.     Review of Systems Review of Systems  Constitutional: Negative for fever, appetite change,  and unexpected weight change.  ENT pos for congestion and rhinorrhea and purulent nasal drainage and facial pain  Eyes: Negative for pain and visual disturbance.  Respiratory: Negative for wheeze  and shortness of breath.   Cardiovascular: Negative for cp or palpitations    Gastrointestinal: Negative for nausea, diarrhea and constipation.  Genitourinary: Negative for urgency and frequency.  Skin: Negative for pallor or rash   Neurological: Negative for weakness,  light-headedness, numbness and headaches.  Hematological: Negative for adenopathy. Does not bruise/bleed easily.  Psychiatric/Behavioral: Negative for dysphoric mood. The patient is not nervous/anxious.         Objective:   Physical Exam  Constitutional: She appears well-developed and well-nourished. No distress.  HENT:  Head: Normocephalic and atraumatic.  Right Ear: External ear normal.  Left Ear: External ear normal.  Mouth/Throat:  Oropharynx is clear and moist. No oropharyngeal exudate.  Nares are injected and congested  Purulent nasal drainage Bilateral maxillary sinus tenderness/ some frontal sinus tenderness   Eyes: Conjunctivae and EOM are normal. Pupils are equal, round, and reactive to light. Right eye exhibits no discharge. Left eye exhibits no discharge. No scleral icterus.  Neck: Normal range of motion. Neck supple. Carotid bruit is not present.  Cardiovascular: Normal rate, regular rhythm, normal heart sounds and intact distal pulses.   Pulmonary/Chest: Effort normal and breath sounds normal. No respiratory distress. She has no wheezes. She has no rales.  Lymphadenopathy:    She has no cervical adenopathy.  Neurological: She is alert. She has normal reflexes. No cranial nerve deficit.  Skin: Skin is warm and dry. No rash noted. No erythema. No pallor.  Psychiatric: She has a normal mood and affect.          Assessment & Plan:   Problem List Items Addressed This Visit      Respiratory   Acute bacterial sinusitis - Primary    Cover with augmentin - ? If recurrent  Disc symptomatic care - see instructions on AVS  Update if not starting to improve in a week or if worsening    Consider f/u ENT if no improvement      Relevant Medications      amoxicillin-clavulanate (AUGMENTIN) tablet 875-125 mg

## 2013-12-28 DIAGNOSIS — L719 Rosacea, unspecified: Secondary | ICD-10-CM | POA: Diagnosis not present

## 2013-12-28 DIAGNOSIS — Z79899 Other long term (current) drug therapy: Secondary | ICD-10-CM | POA: Diagnosis not present

## 2013-12-28 DIAGNOSIS — M25562 Pain in left knee: Secondary | ICD-10-CM | POA: Diagnosis not present

## 2013-12-28 DIAGNOSIS — M0579 Rheumatoid arthritis with rheumatoid factor of multiple sites without organ or systems involvement: Secondary | ICD-10-CM | POA: Diagnosis not present

## 2014-01-01 NOTE — Assessment & Plan Note (Addendum)
Cover with augmentin - ? If recurrent  Disc symptomatic care - see instructions on AVS  Update if not starting to improve in a week or if worsening    Consider f/u ENT if no improvement

## 2014-01-05 NOTE — Telephone Encounter (Signed)
Pt notified of results and wrote these down for her records.  She is agreeable with plan for exercise and calcium and Vit D.   She will call with any further questions.

## 2014-01-11 DIAGNOSIS — Z79899 Other long term (current) drug therapy: Secondary | ICD-10-CM | POA: Diagnosis not present

## 2014-02-02 ENCOUNTER — Telehealth: Payer: Self-pay | Admitting: Family Medicine

## 2014-02-02 DIAGNOSIS — Z0000 Encounter for general adult medical examination without abnormal findings: Secondary | ICD-10-CM

## 2014-02-02 DIAGNOSIS — M810 Age-related osteoporosis without current pathological fracture: Secondary | ICD-10-CM

## 2014-02-02 DIAGNOSIS — E785 Hyperlipidemia, unspecified: Secondary | ICD-10-CM

## 2014-02-02 NOTE — Telephone Encounter (Signed)
-----   Message from Ellamae Sia sent at 01/31/2014  3:54 PM EST ----- Regarding: Lab orders for Friday, 1.29.16 Patient is scheduled for CPX labs, please order future labs, Thanks , Karna Christmas

## 2014-02-03 ENCOUNTER — Other Ambulatory Visit (INDEPENDENT_AMBULATORY_CARE_PROVIDER_SITE_OTHER): Payer: Medicare Other

## 2014-02-03 DIAGNOSIS — M810 Age-related osteoporosis without current pathological fracture: Secondary | ICD-10-CM | POA: Diagnosis not present

## 2014-02-03 DIAGNOSIS — E785 Hyperlipidemia, unspecified: Secondary | ICD-10-CM | POA: Diagnosis not present

## 2014-02-03 DIAGNOSIS — Z0000 Encounter for general adult medical examination without abnormal findings: Secondary | ICD-10-CM

## 2014-02-03 LAB — COMPREHENSIVE METABOLIC PANEL
ALK PHOS: 99 U/L (ref 39–117)
ALT: 25 U/L (ref 0–35)
AST: 26 U/L (ref 0–37)
Albumin: 3.8 g/dL (ref 3.5–5.2)
BUN: 8 mg/dL (ref 6–23)
CALCIUM: 9.1 mg/dL (ref 8.4–10.5)
CO2: 27 meq/L (ref 19–32)
Chloride: 106 mEq/L (ref 96–112)
Creatinine, Ser: 0.72 mg/dL (ref 0.40–1.20)
GFR: 85.54 mL/min (ref >60.00)
Glucose, Bld: 94 mg/dL (ref 70–99)
Potassium: 4.3 mEq/L (ref 3.5–5.1)
Sodium: 141 mEq/L (ref 135–145)
Total Bilirubin: 0.4 mg/dL (ref 0.2–1.2)
Total Protein: 6.8 g/dL (ref 6.0–8.3)

## 2014-02-03 LAB — CBC WITH DIFFERENTIAL/PLATELET
BASOS PCT: 1.2 % (ref 0.0–3.0)
Basophils Absolute: 0.1 10*3/uL (ref 0.0–0.1)
Eosinophils Absolute: 0.6 10*3/uL (ref 0.0–0.7)
Eosinophils Relative: 9 % — ABNORMAL HIGH (ref 0.0–5.0)
HCT: 45.1 % (ref 36.0–46.0)
HEMOGLOBIN: 15.5 g/dL — AB (ref 12.0–15.0)
Lymphocytes Relative: 27.3 % (ref 12.0–46.0)
Lymphs Abs: 1.8 10*3/uL (ref 0.7–4.0)
MCHC: 34.4 g/dL (ref 30.0–36.0)
MCV: 89.9 fl (ref 78.0–100.0)
MONO ABS: 0.8 10*3/uL (ref 0.1–1.0)
MONOS PCT: 13 % — AB (ref 3.0–12.0)
Neutro Abs: 3.2 10*3/uL (ref 1.4–7.7)
Neutrophils Relative %: 49.5 % (ref 43.0–77.0)
PLATELETS: 287 10*3/uL (ref 150.0–400.0)
RBC: 5.01 Mil/uL (ref 3.87–5.11)
RDW: 13.6 % (ref 11.5–15.5)
WBC: 6.4 10*3/uL (ref 4.0–10.5)

## 2014-02-03 LAB — LIPID PANEL
Cholesterol: 190 mg/dL (ref 0–200)
HDL: 56.8 mg/dL (ref >39.00)
LDL CALC: 108 mg/dL — AB (ref 0–99)
NonHDL: 133.2
TRIGLYCERIDES: 127 mg/dL (ref 0.0–149.0)
Total CHOL/HDL Ratio: 3
VLDL: 25.4 mg/dL (ref 0.0–40.0)

## 2014-02-03 LAB — TSH: TSH: 6.2 u[IU]/mL — ABNORMAL HIGH (ref 0.35–4.50)

## 2014-02-03 LAB — VITAMIN D 25 HYDROXY (VIT D DEFICIENCY, FRACTURES): VITD: 55.01 ng/mL (ref 30.00–100.00)

## 2014-02-13 ENCOUNTER — Encounter: Payer: Self-pay | Admitting: Family Medicine

## 2014-02-13 ENCOUNTER — Ambulatory Visit (INDEPENDENT_AMBULATORY_CARE_PROVIDER_SITE_OTHER): Payer: Medicare Other | Admitting: Family Medicine

## 2014-02-13 VITALS — BP 122/76 | HR 68 | Temp 97.8°F | Ht 66.0 in | Wt 177.4 lb

## 2014-02-13 DIAGNOSIS — M810 Age-related osteoporosis without current pathological fracture: Secondary | ICD-10-CM | POA: Diagnosis not present

## 2014-02-13 DIAGNOSIS — Z0000 Encounter for general adult medical examination without abnormal findings: Secondary | ICD-10-CM | POA: Diagnosis not present

## 2014-02-13 DIAGNOSIS — Z23 Encounter for immunization: Secondary | ICD-10-CM

## 2014-02-13 DIAGNOSIS — E785 Hyperlipidemia, unspecified: Secondary | ICD-10-CM | POA: Diagnosis not present

## 2014-02-13 MED ORDER — FLUTICASONE PROPIONATE 50 MCG/ACT NA SUSP: 2.0000 | Freq: Every day | NASAL | Status: DC | PRN

## 2014-02-13 NOTE — Assessment & Plan Note (Signed)
Disc goals for lipids and reasons to control them Rev labs with pt Rev low sat fat diet in detail   

## 2014-02-13 NOTE — Patient Instructions (Signed)
Schedule non fasting labs for about a month to re check thyroid tests  prevnar vaccine today   Take care of yourself  Keep an eye on the sinus symptoms

## 2014-02-13 NOTE — Assessment & Plan Note (Signed)
Reviewed health habits including diet and exercise and skin cancer prevention Reviewed appropriate screening tests for age  Also reviewed health mt list, fam hx and immunization status , as well as social and family history   See HPI Labs reviewed  prevnar today Will see if she can take the shingles vaccine in the future (from her rheumatologist)

## 2014-02-13 NOTE — Progress Notes (Signed)
Pre visit review using our clinic review tool, if applicable. No additional management support is needed unless otherwise documented below in the visit note. 

## 2014-02-13 NOTE — Assessment & Plan Note (Signed)
dexa 12/15 Followed by gyn  Disc need for calcium/ vitamin D/ wt bearing exercise and bone density test every 2 y to monitor Disc safety/ fracture risk in detail

## 2014-02-13 NOTE — Progress Notes (Signed)
Subjective:    Patient ID: Deanna Norris, female    DOB: 1945-10-11, 69 y.o.   MRN: [id number]  HPI Here for annual medicare wellness visit and acute/chronic health issues  I have personally reviewed the Medicare Annual Wellness questionnaire and have noted 1. The patient's medical and social history 2. Their use of alcohol, tobacco or illicit drugs 3. Their current medications and supplements 4. The patient's functional ability including ADL's, fall risks, home safety risks and hearing or visual             impairment. 5. Diet and physical activities 6. Evidence for depression or mood disorders  The patients weight, height, BMI have been recorded in the chart and visual acuity is per eye clinic.  I have made referrals, counseling and provided education to the patient based review of the above and I have provided the pt with a written personalized care plan for preventive services.  Doing fairly well  She had some allergic reactions to some meds (rash) - is holding her RA med temporarily until she is totally better  Overall her RA symptoms are improved  Will likely start today or tomorrow   See scanned forms.  Routine anticipatory guidance given to patient.  See health maintenance. Colon cancer screening 1/15 -10 year recall  Breast cancer screening 12/15 nl  Self breast exam no lumps or changes  Flu vaccine 10/15  Tetanus vaccine -  3/15 at the health dept  Pneumovax 6/12 , will do the prevnar today  Zoster vaccine- unsure when she can get based on her rheum meds - will d/w her rheum  Advance directive- has that written up  Cognitive function addressed- see scanned forms- and if abnormal then additional documentation follows.  Fine overall   PMH and SH reviewed  Meds, vitals, and allergies reviewed.   ROS: See HPI.  Otherwise negative.     Osteopenia Followed by her gyn  D level is 55- very good  No fractures  dexa 12/15  Hyperlipidemia Lab Results    Component Value Date   CHOL 190 02/03/2014   CHOL 227* 02/02/2013   CHOL 234* 02/02/2012   Lab Results  Component Value Date   HDL 56.80 02/03/2014   HDL 62.80 02/02/2013   HDL 64.00 02/02/2012   Lab Results  Component Value Date   LDLCALC 108* 02/03/2014   Lab Results  Component Value Date   TRIG 127.0 02/03/2014   TRIG 88.0 02/02/2013   TRIG 122.0 02/02/2012   Lab Results  Component Value Date   CHOLHDL 3 02/03/2014   CHOLHDL 4 02/02/2013   CHOLHDL 4 02/02/2012   Lab Results  Component Value Date   LDLDIRECT 144.2 02/02/2013   LDLDIRECT 137.3 02/02/2012   she is doing better with her diet   Lab Results  Component Value Date   TSH 6.20* 02/03/2014    She has been sluggish but thinks that is because of being off of RA med and had several sinus inf Daughter is hypothyroid Sister has hashimoto   Lab Results  Component Value Date   WBC 6.4 02/03/2014   HGB 15.5* 02/03/2014   HCT 45.1 02/03/2014   MCV 89.9 02/03/2014   PLT 287.0 02/03/2014      Chemistry      Component Value Date/Time   NA 141 02/03/2014 0824   K 4.3 02/03/2014 0824   CL 106 02/03/2014 0824   CO2 27 02/03/2014 0824   BUN 8 02/03/2014 0824  CREATININE 0.72 02/03/2014 0824      Component Value Date/Time   CALCIUM 9.1 02/03/2014 0824   ALKPHOS 99 02/03/2014 0824   AST 26 02/03/2014 0824   ALT 25 02/03/2014 0824   BILITOT 0.4 02/03/2014 0824      Patient Active Problem List   Diagnosis Date Noted  . Coronary artery calcification seen on CT scan 11/17/2013  . Recurrent sinusitis 09/16/2013  . Acute bacterial sinusitis 08/29/2013  . Encounter for Medicare annual wellness exam 01/31/2013  . Dyspnea 09/14/2012  . Nodule of right lung 09/14/2012  . 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 Hyperlipidemia 05/08/2009  . 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 ASTHMA 05/08/2009  . GERD 05/08/2009  . MENOPAUSAL DISORDER 05/08/2009  . ARTHRITIS, RHEUMATOID 05/08/2009  . Osteoporosis 05/08/2009  .  SPONDYLOLISTHESIS 05/08/2009  . Sleep apnea 05/08/2009   Past Medical History  Diagnosis Date  . Osteoporosis   . Menopausal symptoms   . Postmenopausal HRT (hormone replacement therapy)   . RA (rheumatoid arthritis) 11/2004    methotrexate and Embrel  . Vitamin D deficiency   . Insomnia   . GERD (gastroesophageal reflux disease)   . Asthma   . Fibroid   . Sleep apnea 2008    C-Pap   Past Surgical History  Procedure Laterality Date  . Vaginal delivery      x3  . Cystocele repair  10/2007    with graft  . Abdominal hysterectomy  01/1993    partial with AP repair, secondary to fibroids  . Breast surgery  1991    breast biopsy  . Melanoma excision Right 01/2012    excision right posterior arm  . Nasal sinus surgery  07/1991   History  Substance Use Topics  . Smoking status: Never Smoker   . Smokeless tobacco: Never Used  . Alcohol Use: No   Family History  Problem Relation Age of Onset  . Breast cancer Sister 75  . Thyroid disease Sister    Allergies  Allergen Reactions  . Adalimumab     REACTION: frequent sinus infection  . Diclofenac     unk  . Dicyclomine     Turn teeth gray  . Doxycycline Hyclate Other (See Comments)    "turns teeth gray"  . Fosamax [Alendronate Sodium]     Acid reflux  . Methotrexate Derivatives Nausea Only    Oral metotrexate  . Nickel   . Piroxicam     unk  . Sulfamethoxazole-Trimethoprim     REACTION: face and hands rash and felt burning sensation  . Tramadol Hcl     REACTION: nausea  . Plaquenil [Hydroxychloroquine] Rash   Current Outpatient Prescriptions on File Prior to Visit  Medication Sig Dispense Refill  . acetaminophen (TYLENOL) 325 MG tablet Take 650 mg by mouth every 6 (six) hours as needed.    Marland Kitchen albuterol (PROAIR HFA) 108 (90 BASE) MCG/ACT inhaler Inhale 2 puffs into the lungs every 4 (four) hours as needed. 1 Inhaler 3  . aspirin 81 MG tablet Take 81 mg by mouth daily.     . Calcium Citrate-Vitamin D  (CITRACAL/VITAMIN D PO) Take 1 tablet by mouth daily.      . Cholecalciferol (VITAMIN D3) 2000 UNITS TABS Take 1 tablet by mouth daily.      . Estradiol 10 MCG TABS vaginal tablet Place 1 tablet (10 mcg total) vaginally 2 (two) times a week. 24 tablet 3  . fluticasone (FLONASE) 50 MCG/ACT nasal spray Place 2 sprays into both  nostrils daily as needed for rhinitis or allergies. 48 g 3  . Folic Acid-Vit S2-GBT D17 (FOLGARD RX) 2.2-25-1 MG TABS Take 1 tablet by mouth daily.      . hydrocortisone 2.5 % ointment Apply 1 application topically 2 (two) times daily.    . methotrexate 25 MG/ML SOLN Inject 1 ml under the skin every week     . Multiple Vitamin (MULTIVITAMIN) capsule Take 1 capsule by mouth daily.      . pilocarpine (SALAGEN) 5 MG tablet Take 5 mg by mouth 3 (three) times daily.     . pseudoephedrine-guaifenesin (MUCINEX D) 60-600 MG per tablet Take 1 tablet by mouth every 12 (twelve) hours. As needed    . RABEprazole (ACIPHEX) 20 MG tablet Take 20 mg by mouth daily.    Marland Kitchen zolpidem (AMBIEN) 5 MG tablet Take 2.5-5 mg by mouth at bedtime as needed for sleep.      No current facility-administered medications on file prior to visit.      Review of Systems Review of Systems  Constitutional: Negative for fever, appetite change, fatigue and unexpected weight change.  Eyes: Negative for pain and visual disturbance.  Respiratory: Negative for cough and shortness of breath.   Cardiovascular: Negative for cp or palpitations    Gastrointestinal: Negative for nausea, diarrhea and constipation.  Genitourinary: Negative for urgency and frequency.  Skin: Negative for pallor or rash   Neurological: Negative for weakness, light-headedness, numbness and headaches.  Hematological: Negative for adenopathy. Does not bruise/bleed easily.  Psychiatric/Behavioral: Negative for dysphoric mood. The patient is not nervous/anxious.         Objective:   Physical Exam  Constitutional: She appears well-developed  and well-nourished. No distress.  HENT:  Head: Normocephalic and atraumatic.  Right Ear: External ear normal.  Left Ear: External ear normal.  Nose: Nose normal.  Mouth/Throat: Oropharynx is clear and moist.  Eyes: Conjunctivae and EOM are normal. Pupils are equal, round, and reactive to light. Right eye exhibits no discharge. Left eye exhibits no discharge. No scleral icterus.  Neck: Normal range of motion. Neck supple. No JVD present. No thyromegaly present.  Cardiovascular: Normal rate, regular rhythm, normal heart sounds and intact distal pulses.  Exam reveals no gallop.   Pulmonary/Chest: Effort normal and breath sounds normal. No respiratory distress. She has no wheezes. She has no rales.  Abdominal: Soft. Bowel sounds are normal. She exhibits no distension and no mass. There is no tenderness.  Musculoskeletal: She exhibits no edema or tenderness.  Lymphadenopathy:    She has no cervical adenopathy.  Neurological: She is alert. She has normal reflexes. No cranial nerve deficit. She exhibits normal muscle tone. Coordination normal.  Skin: Skin is warm and dry. No rash noted. No erythema. No pallor.  Psychiatric: She has a normal mood and affect.          Assessment & Plan:   Problem List Items Addressed This Visit      Musculoskeletal and Integument   Osteoporosis    dexa 12/15 Followed by gyn  Disc need for calcium/ vitamin D/ wt bearing exercise and bone density test every 2 y to monitor Disc safety/ fracture risk in detail          Other   Encounter for Medicare annual wellness exam - Primary    Reviewed health habits including diet and exercise and skin cancer prevention Reviewed appropriate screening tests for age  Also reviewed health mt list, fam hx and immunization status , as  well as social and family history   See HPI Labs reviewed  prevnar today Will see if she can take the shingles vaccine in the future (from her rheumatologist)      Hyperlipidemia     Disc goals for lipids and reasons to control them Rev labs with pt Rev low sat fat diet in detail        Other Visit Diagnoses    Need for pneumococcal vaccination        Relevant Orders    Pneumococcal conjugate vaccine 13-valent IM (Completed)

## 2014-02-24 DIAGNOSIS — L821 Other seborrheic keratosis: Secondary | ICD-10-CM | POA: Diagnosis not present

## 2014-02-24 DIAGNOSIS — D225 Melanocytic nevi of trunk: Secondary | ICD-10-CM | POA: Diagnosis not present

## 2014-02-24 DIAGNOSIS — Z85820 Personal history of malignant melanoma of skin: Secondary | ICD-10-CM | POA: Diagnosis not present

## 2014-02-24 DIAGNOSIS — D2271 Melanocytic nevi of right lower limb, including hip: Secondary | ICD-10-CM | POA: Diagnosis not present

## 2014-02-24 DIAGNOSIS — D1801 Hemangioma of skin and subcutaneous tissue: Secondary | ICD-10-CM | POA: Diagnosis not present

## 2014-03-06 DIAGNOSIS — Z79899 Other long term (current) drug therapy: Secondary | ICD-10-CM | POA: Diagnosis not present

## 2014-03-13 ENCOUNTER — Telehealth: Payer: Self-pay | Admitting: Family Medicine

## 2014-03-13 DIAGNOSIS — R7989 Other specified abnormal findings of blood chemistry: Secondary | ICD-10-CM

## 2014-03-13 NOTE — Telephone Encounter (Signed)
-----   Message from Ellamae Sia sent at 03/09/2014  3:03 PM EST ----- Regarding: Lab orders for Tuesday,3.8.16 Lab orders, thanks

## 2014-03-15 ENCOUNTER — Other Ambulatory Visit (INDEPENDENT_AMBULATORY_CARE_PROVIDER_SITE_OTHER): Payer: Medicare Other

## 2014-03-15 DIAGNOSIS — R7989 Other specified abnormal findings of blood chemistry: Secondary | ICD-10-CM

## 2014-03-15 LAB — T4, FREE: FREE T4: 0.68 ng/dL (ref 0.60–1.60)

## 2014-03-15 LAB — TSH: TSH: 1.89 u[IU]/mL (ref 0.35–4.50)

## 2014-05-03 ENCOUNTER — Telehealth: Payer: Self-pay | Admitting: *Deleted

## 2014-05-03 DIAGNOSIS — M5137 Other intervertebral disc degeneration, lumbosacral region: Secondary | ICD-10-CM | POA: Diagnosis not present

## 2014-05-03 DIAGNOSIS — M0579 Rheumatoid arthritis with rheumatoid factor of multiple sites without organ or systems involvement: Secondary | ICD-10-CM | POA: Diagnosis not present

## 2014-05-03 DIAGNOSIS — M25571 Pain in right ankle and joints of right foot: Secondary | ICD-10-CM | POA: Diagnosis not present

## 2014-05-03 DIAGNOSIS — M25561 Pain in right knee: Secondary | ICD-10-CM | POA: Diagnosis not present

## 2014-05-03 MED ORDER — ZOSTER VACCINE LIVE 19400 UNT/0.65ML ~~LOC~~ SOLR: 0.6500 mL | Freq: Once | SUBCUTANEOUS | Status: DC

## 2014-05-03 NOTE — Telephone Encounter (Signed)
Px sent - she does need to check with her rheumatologist re: methotrexate and whether to hold when she gets the vaccine

## 2014-05-03 NOTE — Telephone Encounter (Signed)
Patient has contacted her insurance company to verify coverage of shingles vaccine.  They will cover if done at pharmacy.  Patient is requesting a prescription for vaccine to [facility] on [address] and would like a phone call when done.

## 2014-05-04 NOTE — Telephone Encounter (Signed)
Pt notified Rx sent and to check with rheumatologist before getting Rx about weather to hold med, pt verbalized understanding

## 2014-05-23 DIAGNOSIS — Z79899 Other long term (current) drug therapy: Secondary | ICD-10-CM | POA: Diagnosis not present

## 2014-05-31 ENCOUNTER — Telehealth: Payer: Self-pay | Admitting: *Deleted

## 2014-05-31 ENCOUNTER — Encounter: Payer: Self-pay | Admitting: Podiatry

## 2014-05-31 ENCOUNTER — Ambulatory Visit (INDEPENDENT_AMBULATORY_CARE_PROVIDER_SITE_OTHER): Payer: Medicare Other | Admitting: Podiatry

## 2014-05-31 VITALS — BP 116/73 | HR 60 | Resp 18

## 2014-05-31 DIAGNOSIS — B351 Tinea unguium: Secondary | ICD-10-CM

## 2014-05-31 DIAGNOSIS — L603 Nail dystrophy: Secondary | ICD-10-CM

## 2014-05-31 DIAGNOSIS — H02825 Cysts of left lower eyelid: Secondary | ICD-10-CM | POA: Diagnosis not present

## 2014-05-31 DIAGNOSIS — H04123 Dry eye syndrome of bilateral lacrimal glands: Secondary | ICD-10-CM | POA: Diagnosis not present

## 2014-05-31 DIAGNOSIS — H2513 Age-related nuclear cataract, bilateral: Secondary | ICD-10-CM | POA: Diagnosis not present

## 2014-05-31 NOTE — Telephone Encounter (Signed)
Left 1st toenail fragment sent to [facility] for definitive diagnosis of fungal element.

## 2014-05-31 NOTE — Progress Notes (Signed)
   Subjective:    Patient ID: Deanna Norris, female    DOB: 1945/10/03, 69 y.o.   MRN: [id number]  HPI  69 year old female presents the office today with concerns of left hallux nail thickening, discoloration. She is concern for fungus and she is inquiring about possible laser treatment. She denies any pain associated the toenail she denies he is ready redness or drainage. She states the nail has fallen off before getting crew back thickened and discolored. No other complaints at this time.    Review of Systems  All other systems reviewed and are negative.      Objective:   Physical Exam AAO x3, NAD DP/PT pulses palpable bilaterally, CRT less than 3 seconds Protective sensation intact with Simms Weinstein monofilament, vibratory sensation intact, Achilles tendon reflex intact Left hallux nails hypertrophic, dystrophic, discolored, brittle. There is no surrounding erythema, edema, ascending cellulitis, drainage. There is no tenderness to palpation around the nail. Remaining nails also are slightly dystrophic, discolored without any tenderness palpation. No areas of tenderness to bilateral lower extremities. MMT 5/5, ROM WNL.  No open lesions or pre-ulcerative lesions.  No overlying edema, erythema, increase in warmth to bilateral lower extremities.  No pain with calf compression, swelling, warmth, erythema bilaterally.     Assessment & Plan:  69 year old female with likely onychomycosis, mostly over the left hallux nail -Treatment options discussed including all alternatives, risks, and complications -At today's appointment the nail was biopsied and was sent to [facility] labs for evaluation. I discussed with her laser treatment as well as other treatments for onychomycosis. However we will hold off on starting treatment to the results the biopsy is obtained. -Follow-up after nail biopsy or sooner should any problems arise. In the meantime, encouraged to call the office with any  questions/concnerns/change in symptoms.

## 2014-06-08 DIAGNOSIS — M0579 Rheumatoid arthritis with rheumatoid factor of multiple sites without organ or systems involvement: Secondary | ICD-10-CM | POA: Diagnosis not present

## 2014-06-08 DIAGNOSIS — Z79899 Other long term (current) drug therapy: Secondary | ICD-10-CM | POA: Diagnosis not present

## 2014-06-23 ENCOUNTER — Telehealth: Payer: Self-pay | Admitting: *Deleted

## 2014-06-23 NOTE — Telephone Encounter (Signed)
Dr. Jacqualyn Posey reviewed pt's [place] fungal culture as positive for onychomycosis and orders pt to be seen for discussion of treatment.

## 2014-07-07 ENCOUNTER — Ambulatory Visit: Payer: Medicare Other | Admitting: Podiatry

## 2014-07-12 DIAGNOSIS — Z79899 Other long term (current) drug therapy: Secondary | ICD-10-CM | POA: Diagnosis not present

## 2014-07-24 ENCOUNTER — Encounter: Payer: Self-pay | Admitting: Podiatry

## 2014-07-24 ENCOUNTER — Ambulatory Visit (INDEPENDENT_AMBULATORY_CARE_PROVIDER_SITE_OTHER): Payer: Medicare Other | Admitting: Podiatry

## 2014-07-24 VITALS — BP 124/73 | HR 58 | Resp 15

## 2014-07-24 DIAGNOSIS — B351 Tinea unguium: Secondary | ICD-10-CM

## 2014-07-24 DIAGNOSIS — M069 Rheumatoid arthritis, unspecified: Secondary | ICD-10-CM | POA: Diagnosis not present

## 2014-07-24 DIAGNOSIS — M2142 Flat foot [pes planus] (acquired), left foot: Secondary | ICD-10-CM | POA: Diagnosis not present

## 2014-07-24 DIAGNOSIS — M2141 Flat foot [pes planus] (acquired), right foot: Secondary | ICD-10-CM

## 2014-07-25 NOTE — Progress Notes (Signed)
Patient ID: Deanna Norris, female   DOB: December 23, 1945, 69 y.o.   MRN: [id number]  Subjective: 69 year old female presents the office they discussed nail biopsy results. She does state that since last appointment she has come off of some of her rheumatoid arthritis medicines and she lives the toenails cleared up. She denies any pain to the toenail she denies any swelling redness or drainage. Also while here she was inquiring about possible orthotics. Given her rheumatoid arthritis and her flatfeet she was inquiring to see if orthotics would be beneficial for her. She does not have any specific tenderness to her feet at this time however she was inquiring to see if this could help prevent future problems. No other complaints at this time no acute changes since last appointment.  Objective: AAO 3, NAD Neurovascular status unchanged Left hallux toenail does appear to be considerably improved compared to last appointment. Is a small amount discoloration distally over the proximal nail border appears to be clear. It appears of the promises grown out. There is no surrounding erythema or drainage on any the nail sites there is no tenderness palpation over the nails. There is a decrease in medial arch height upon weightbearing with equinus present. Ankle joint range of motion is intact. Subtalar joint range of motion is somewhat limited. Nose no specific area of tenderness to bilateral lower extremities and there is no overlying edema, erythema, increase in warmth. MMT 5/5, ROM WNL except otherwise stated.  No open lesions or pre-ulcer lesions identified bilaterally There is no pain with calf compression, swelling, warmth, erythema  Assessment: 69 year old female with onychomycosis which is apparently resolving; flatfoot with rheumatoid arthritis  Plan: -I discussed nail biopsy results of the patient. After having a discussion with her she wishes to hold off on any treatment. -Given her flatfoot  rheumatoid arthritis she was asked not orthotics. He will do his been beneficial for her to help prevent future problems. She was scanned for orthotics and they were sent to [name] labs. -Follow-up after orthotics or sooner if any palms are to arise. Call if questions or concerns.  Celesta Gentile, DPM

## 2014-08-01 DIAGNOSIS — M1712 Unilateral primary osteoarthritis, left knee: Secondary | ICD-10-CM | POA: Diagnosis not present

## 2014-08-01 DIAGNOSIS — Z09 Encounter for follow-up examination after completed treatment for conditions other than malignant neoplasm: Secondary | ICD-10-CM | POA: Diagnosis not present

## 2014-08-01 DIAGNOSIS — M069 Rheumatoid arthritis, unspecified: Secondary | ICD-10-CM | POA: Diagnosis not present

## 2014-08-01 DIAGNOSIS — M25561 Pain in right knee: Secondary | ICD-10-CM | POA: Diagnosis not present

## 2014-08-01 DIAGNOSIS — M25571 Pain in right ankle and joints of right foot: Secondary | ICD-10-CM | POA: Diagnosis not present

## 2014-08-11 DIAGNOSIS — H04123 Dry eye syndrome of bilateral lacrimal glands: Secondary | ICD-10-CM | POA: Diagnosis not present

## 2014-08-14 ENCOUNTER — Ambulatory Visit: Payer: Medicare Other | Admitting: Nurse Practitioner

## 2014-08-14 ENCOUNTER — Encounter: Payer: Self-pay | Admitting: Nurse Practitioner

## 2014-08-23 ENCOUNTER — Ambulatory Visit (INDEPENDENT_AMBULATORY_CARE_PROVIDER_SITE_OTHER): Payer: Medicare Other | Admitting: *Deleted

## 2014-08-23 DIAGNOSIS — M2141 Flat foot [pes planus] (acquired), right foot: Secondary | ICD-10-CM

## 2014-08-23 DIAGNOSIS — M2142 Flat foot [pes planus] (acquired), left foot: Secondary | ICD-10-CM

## 2014-08-23 NOTE — Patient Instructions (Signed)

## 2014-09-05 DIAGNOSIS — D225 Melanocytic nevi of trunk: Secondary | ICD-10-CM | POA: Diagnosis not present

## 2014-09-05 DIAGNOSIS — D1801 Hemangioma of skin and subcutaneous tissue: Secondary | ICD-10-CM | POA: Diagnosis not present

## 2014-09-05 DIAGNOSIS — L718 Other rosacea: Secondary | ICD-10-CM | POA: Diagnosis not present

## 2014-09-05 DIAGNOSIS — L812 Freckles: Secondary | ICD-10-CM | POA: Diagnosis not present

## 2014-09-05 DIAGNOSIS — Z85820 Personal history of malignant melanoma of skin: Secondary | ICD-10-CM | POA: Diagnosis not present

## 2014-09-05 DIAGNOSIS — L218 Other seborrheic dermatitis: Secondary | ICD-10-CM | POA: Diagnosis not present

## 2014-09-05 DIAGNOSIS — L821 Other seborrheic keratosis: Secondary | ICD-10-CM | POA: Diagnosis not present

## 2014-09-18 ENCOUNTER — Ambulatory Visit: Payer: Medicare Other | Admitting: Podiatry

## 2014-09-19 DIAGNOSIS — G4733 Obstructive sleep apnea (adult) (pediatric): Secondary | ICD-10-CM | POA: Diagnosis not present

## 2014-09-21 DIAGNOSIS — Z79899 Other long term (current) drug therapy: Secondary | ICD-10-CM | POA: Diagnosis not present

## 2014-10-18 ENCOUNTER — Ambulatory Visit (INDEPENDENT_AMBULATORY_CARE_PROVIDER_SITE_OTHER): Payer: Medicare Other | Admitting: Nurse Practitioner

## 2014-10-18 ENCOUNTER — Encounter: Payer: Self-pay | Admitting: Nurse Practitioner

## 2014-10-18 VITALS — BP 100/66 | HR 78 | Resp 16 | Ht 65.5 in | Wt 171.0 lb

## 2014-10-18 DIAGNOSIS — Z124 Encounter for screening for malignant neoplasm of cervix: Secondary | ICD-10-CM | POA: Diagnosis not present

## 2014-10-18 DIAGNOSIS — Z1272 Encounter for screening for malignant neoplasm of vagina: Secondary | ICD-10-CM | POA: Diagnosis not present

## 2014-10-18 DIAGNOSIS — Z0000 Encounter for general adult medical examination without abnormal findings: Secondary | ICD-10-CM | POA: Diagnosis not present

## 2014-10-18 DIAGNOSIS — M810 Age-related osteoporosis without current pathological fracture: Secondary | ICD-10-CM

## 2014-10-18 DIAGNOSIS — Z01419 Encounter for gynecological examination (general) (routine) without abnormal findings: Secondary | ICD-10-CM | POA: Diagnosis not present

## 2014-10-18 MED ORDER — CLOBETASOL PROPIONATE 0.05 % EX OINT: 1.0000 "application " | TOPICAL_OINTMENT | Freq: Two times a day (BID) | CUTANEOUS | Status: DC

## 2014-10-18 MED ORDER — ESTRADIOL 10 MCG VA TABS: 1.0000 | ORAL_TABLET | VAGINAL | Status: DC

## 2014-10-18 NOTE — Progress Notes (Signed)
69 y.o. G3P3 Married  Caucasian Fe here for annual exam. She is doing well.  Stays busy and active as much as she can.   Patient's last menstrual period was 01/06/1993.          Sexually active: Yes.    The current method of family planning is status post hysterectomy.    Exercising: Yes.     Smoker:  no  Health Maintenance: Pap:  ? MMG:  12/09/13 BIRADS1:Neg Colonoscopy:  02/2012 Normal - repeat 10 years  BMD:  12/09/13 spine -1.4; left hip neck -2.1; right hip neck -2.6 TDaP:  03/13/2013  Shingles:05/08/2014 Prevnar: 2/8/22016 Labs: PCP   reports that she has never smoked. She has never used smokeless tobacco. She reports that she does not drink alcohol or use illicit drugs.  Past Medical History  Diagnosis Date  . Osteoporosis   . Menopausal symptoms   . Postmenopausal HRT (hormone replacement therapy)   . RA (rheumatoid arthritis) ([place]) 11/2004    methotrexate and Embrel  . Vitamin D deficiency   . Insomnia   . GERD (gastroesophageal reflux disease)   . Asthma   . Fibroid   . Sleep apnea 2008    C-Pap    Past Surgical History  Procedure Laterality Date  . Vaginal delivery      x3  . Cystocele repair  10/2007    with graft  . Abdominal hysterectomy  01/1993    partial with AP repair, secondary to fibroids  . Breast surgery  1991    breast biopsy  . Melanoma excision Right 01/2012    excision right posterior arm  . Nasal sinus surgery  07/1991    Current Outpatient Prescriptions  Medication Sig Dispense Refill  . Abatacept (ORENCIA CLICKJECT [place]) Inject into the skin every 7 (seven) days.    Marland Kitchen acetaminophen (TYLENOL) 325 MG tablet Take 650 mg by mouth every 6 (six) hours as needed.    Marland Kitchen albuterol (PROAIR HFA) 108 (90 BASE) MCG/ACT inhaler Inhale 2 puffs into the lungs every 4 (four) hours as needed. 1 Inhaler 3  . aspirin 81 MG tablet Take 81 mg by mouth daily.     . Calcium Citrate-Vitamin D (CITRACAL/VITAMIN D PO) Take 1 tablet by mouth daily.      . Cholecalciferol  (VITAMIN D3) 2000 UNITS TABS Take 1 tablet by mouth daily.      . Estradiol 10 MCG TABS vaginal tablet Place 1 tablet (10 mcg total) vaginally 2 (two) times a week. 24 tablet 3  . fluticasone (FLONASE) 50 MCG/ACT nasal spray Place 2 sprays into both nostrils daily as needed for rhinitis or allergies. (Patient taking differently: Place 2 sprays into both nostrils daily. ) 48 g 3  . Folic Acid-Vit L7-LGX Q11 (FOLGARD RX) 2.2-25-1 MG TABS Take 1 tablet by mouth daily.      . hydrocortisone 2.5 % ointment Apply 1 application topically as needed.     . methotrexate 25 MG/ML SOLN Inject 1 ml under the skin every week     . metroNIDAZOLE (METROGEL) 0.75 % gel Apply 1 application topically 2 (two) times daily.    . Multiple Vitamin (MULTIVITAMIN) capsule Take 1 capsule by mouth daily.      . pilocarpine (SALAGEN) 5 MG tablet Take 5 mg by mouth 3 (three) times daily. Patient takes 0.25 mg TID. She gets headaches and severe sweating if she takes more.    . ranitidine (ZANTAC) 300 MG tablet Take 300 mg by mouth 2 (  two) times daily.     Marland Kitchen zolpidem (AMBIEN) 5 MG tablet Take 2.5-5 mg by mouth at bedtime as needed for sleep.      No current facility-administered medications for this visit.    Family History  Problem Relation Age of Onset  . Breast cancer Sister 36  . Thyroid disease Sister     ROS:  Pertinent items are noted in HPI.  Otherwise, a comprehensive ROS was negative.  Exam:   BP 100/66 mmHg  Pulse 78  Resp 16  Ht 5' 5.5" (1.664 m)  Wt 171 lb (77.565 kg)  BMI 28.01 kg/m2  LMP 01/06/1993 Height: 5' 5.5" (166.4 cm) Ht Readings from Last 3 Encounters:  10/18/14 5' 5.5" (1.664 m)  02/13/14 5\' 6"  (1.676 m)  12/27/13 5\' 6"  (1.676 m)    General appearance: alert, cooperative and appears stated age Head: Normocephalic, without obvious abnormality, atraumatic Neck: no adenopathy, supple, symmetrical, trachea midline and thyroid normal to inspection and palpation Lungs: clear to auscultation  bilaterally Breasts: normal appearance, no masses or tenderness Heart: regular rate and rhythm Abdomen: soft, non-tender; no masses,  no organomegaly Extremities: extremities normal, atraumatic, no cyanosis or edema Skin: Skin color, texture, turgor normal. No rashes or lesions Lymph nodes: Cervical, supraclavicular, and axillary nodes normal. No abnormal inguinal nodes palpated Neurologic: Grossly normal   Pelvic: External genitalia:  no lesions, few LSA lesions without flare              Urethra:  normal appearing urethra with no masses, tenderness or lesions              Bartholin's and Skene's: normal                 Vagina: atrophic appearing vagina with normal color and discharge, no lesions              Cervix: absent              Pap taken: Yes.  per request Bimanual Exam:  Uterus:  uterus absent              Adnexa: no mass, fullness, tenderness               Rectovaginal: Confirms               Anus:  normal sphincter tone, no lesions  Chaperone present: yes  A:  Well Woman with normal exam  S/P TAH, APR 01/1993 secondary to fibroids History of RA on methotrexate History of Vit D deficiency Osteoporosis - Actonel 2004- 2006 & 2008- 2010   P:   Reviewed health and wellness pertinent to exam  Pap smear as above  Mammogram is due 12/2014  Refill on Vagifem for a year  Counseled with risk of DVT, CVA, cancer, etc  Counseled on breast self exam, mammography screening, use and side effects of HRT, adequate intake of calcium and vitamin D, diet and exercise, Kegel's exercises return annually or prn  An After Visit Summary was printed and given to the patient.

## 2014-10-18 NOTE — Patient Instructions (Signed)

## 2014-10-19 NOTE — Progress Notes (Signed)
Encounter reviewed by Dr. Brook Amundson C. Silva.  

## 2014-10-20 LAB — IPS PAP SMEAR ONLY

## 2014-10-30 DIAGNOSIS — Z23 Encounter for immunization: Secondary | ICD-10-CM | POA: Diagnosis not present

## 2014-11-13 DIAGNOSIS — Z79899 Other long term (current) drug therapy: Secondary | ICD-10-CM | POA: Diagnosis not present

## 2014-12-20 DIAGNOSIS — Z803 Family history of malignant neoplasm of breast: Secondary | ICD-10-CM | POA: Diagnosis not present

## 2014-12-20 DIAGNOSIS — Z1231 Encounter for screening mammogram for malignant neoplasm of breast: Secondary | ICD-10-CM | POA: Diagnosis not present

## 2015-01-02 ENCOUNTER — Encounter: Payer: Self-pay | Admitting: Family Medicine

## 2015-01-02 ENCOUNTER — Ambulatory Visit (INDEPENDENT_AMBULATORY_CARE_PROVIDER_SITE_OTHER): Payer: Medicare Other | Admitting: Family Medicine

## 2015-01-02 VITALS — BP 140/80 | HR 63 | Temp 98.0°F | Ht 65.5 in | Wt 176.5 lb

## 2015-01-02 DIAGNOSIS — J019 Acute sinusitis, unspecified: Secondary | ICD-10-CM

## 2015-01-02 DIAGNOSIS — B9689 Other specified bacterial agents as the cause of diseases classified elsewhere: Secondary | ICD-10-CM

## 2015-01-02 MED ORDER — AMOXICILLIN-POT CLAVULANATE 875-125 MG PO TABS: 1.0000 | ORAL_TABLET | Freq: Two times a day (BID) | ORAL | Status: DC

## 2015-01-02 NOTE — Progress Notes (Signed)
Pre visit review using our clinic review tool, if applicable. No additional management support is needed unless otherwise documented below in the visit note. 

## 2015-01-02 NOTE — Patient Instructions (Signed)
Give the zpack 4 more days to work - if no improvement or worse- fill the px for augmentin and take it all  mucinex D is ok  Drink lots of fluids and rest  Warm compresses on sinuses / and breathing steam are helpful

## 2015-01-02 NOTE — Progress Notes (Signed)
Subjective:    Patient ID: Deanna Norris, female    DOB: Nov 15, 1945, 69 y.o.   MRN: [id number]  HPI Here for sinus symptoms  Took a zpack and not finished with it yet  Improved but not better   Yellow discharge  Pain in sinuses and back of head  Cough  Congestion   Started with a cold -then worsened after about 5 days   Had a fever on xmas eve- chilled and sweating   mucinex over the counter  Or delsym   Has RA- holding her meds while she is sick Is on immunosup med   Patient Active Problem List   Diagnosis Date Noted  . Abnormal TSH 03/13/2014  . Coronary artery calcification seen on CT scan 11/17/2013  . Recurrent sinusitis 09/16/2013  . Acute bacterial sinusitis 08/29/2013  . Encounter for Medicare annual wellness exam 01/31/2013  . Dyspnea 09/14/2012  . Nodule of right lung 09/14/2012  . 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 Hyperlipidemia 05/08/2009  . 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 ASTHMA 05/08/2009  . GERD 05/08/2009  . MENOPAUSAL DISORDER 05/08/2009  . ARTHRITIS, RHEUMATOID 05/08/2009  . Osteoporosis 05/08/2009  . SPONDYLOLISTHESIS 05/08/2009  . Sleep apnea 05/08/2009   Past Medical History  Diagnosis Date  . Osteoporosis   . Menopausal symptoms   . Postmenopausal HRT (hormone replacement therapy)   . RA (rheumatoid arthritis) ([place]) 11/2004    methotrexate and Embrel  . Vitamin D deficiency   . Insomnia   . GERD (gastroesophageal reflux disease)   . Asthma   . Fibroid   . Sleep apnea 2008    C-Pap   Past Surgical History  Procedure Laterality Date  . Vaginal delivery      x3  . Cystocele repair  10/2007    with graft  . Abdominal hysterectomy  01/1993    partial with AP repair, secondary to fibroids  . Breast surgery  1991    breast biopsy  . Melanoma excision Right 01/2012    excision right posterior arm  . Nasal sinus surgery  07/1991   Social History  Substance Use Topics  . Smoking status: Never Smoker   . Smokeless tobacco: Never  Used  . Alcohol Use: No   Family History  Problem Relation Age of Onset  . Breast cancer Sister 18  . Thyroid disease Sister    Allergies  Allergen Reactions  . Adalimumab     REACTION: frequent sinus infection  . Diclofenac     unk  . Dicyclomine     Turn teeth gray  . Doxycycline Hyclate Other (See Comments)    "turns teeth gray"  . Fosamax [Alendronate Sodium]     Acid reflux  . Methotrexate Derivatives Nausea Only    Oral metotrexate  . Nickel   . Piroxicam     unk  . Salagen [Pilocarpine Hcl] Other (See Comments)    Excessive sweating, headaches  . Sulfamethoxazole-Trimethoprim     REACTION: face and hands rash and felt burning sensation  . Tramadol Hcl     REACTION: nausea  . Plaquenil [Hydroxychloroquine] Rash   Current Outpatient Prescriptions on File Prior to Visit  Medication Sig Dispense Refill  . Abatacept (ORENCIA CLICKJECT [place]) Inject into the skin every 7 (seven) days.    Marland Kitchen acetaminophen (TYLENOL) 325 MG tablet Take 650 mg by mouth every 6 (six) hours as needed.    Marland Kitchen albuterol (PROAIR HFA) 108 (90 BASE) MCG/ACT inhaler Inhale 2 puffs into the  lungs every 4 (four) hours as needed. 1 Inhaler 3  . aspirin 81 MG tablet Take 81 mg by mouth daily.     . Calcium Citrate-Vitamin D (CITRACAL/VITAMIN D PO) Take 1 tablet by mouth daily.      . Cholecalciferol (VITAMIN D3) 2000 UNITS TABS Take 1 tablet by mouth daily.      . Estradiol 10 MCG TABS vaginal tablet Place 1 tablet (10 mcg total) vaginally 2 (two) times a week. 24 tablet 3  . fluticasone (FLONASE) 50 MCG/ACT nasal spray Place 2 sprays into both nostrils daily as needed for rhinitis or allergies. (Patient taking differently: Place 2 sprays into both nostrils daily. ) 48 g 3  . Folic Acid-Vit Q000111Q 123456 (FOLGARD RX) 2.2-25-1 MG TABS Take 1 tablet by mouth daily.      . hydrocortisone 2.5 % ointment Apply 1 application topically as needed.     . methotrexate 25 MG/ML SOLN Inject 1 ml under the skin every week      . metroNIDAZOLE (METROGEL) 0.75 % gel Apply 1 application topically 2 (two) times daily.    . Multiple Vitamin (MULTIVITAMIN) capsule Take 1 capsule by mouth daily.      . ranitidine (ZANTAC) 300 MG tablet Take 300 mg by mouth 2 (two) times daily.     Marland Kitchen zolpidem (AMBIEN) 5 MG tablet Take 2.5-5 mg by mouth at bedtime as needed for sleep.      No current facility-administered medications on file prior to visit.    Review of Systems Review of Systems  Constitutional: Negative for fever, appetite change,  and unexpected weight change.  ENT pos for cong and rhinorrhea and sinus pain  Eyes: Negative for pain and visual disturbance.  Respiratory: Negative for wheeze and shortness of breath.   Cardiovascular: Negative for cp or palpitations    Gastrointestinal: Negative for nausea, diarrhea and constipation.  Genitourinary: Negative for urgency and frequency.  Skin: Negative for pallor or rash   Neurological: Negative for weakness, light-headedness, numbness and headaches.  Hematological: Negative for adenopathy. Does not bruise/bleed easily.  Psychiatric/Behavioral: Negative for dysphoric mood. The patient is not nervous/anxious.         Objective:   Physical Exam  Constitutional: She appears well-developed and well-nourished. No distress.  HENT:  Head: Normocephalic and atraumatic.  Right Ear: External ear normal.  Left Ear: External ear normal.  Mouth/Throat: Oropharynx is clear and moist. No oropharyngeal exudate.  Nares are injected and congested  Bilateral frontal/ethmoid sinus tenderness (worse on the L) Post nasal drip -noted  Eyes: Conjunctivae and EOM are normal. Pupils are equal, round, and reactive to light. Right eye exhibits no discharge. Left eye exhibits no discharge.  Neck: Normal range of motion. Neck supple.  Cardiovascular: Normal rate and regular rhythm.   Pulmonary/Chest: Effort normal and breath sounds normal. No respiratory distress. She has no wheezes. She has  no rales.  Lymphadenopathy:    She has no cervical adenopathy.  Neurological: She is alert. No cranial nerve deficit.  Skin: Skin is warm and dry. No rash noted.  Psychiatric: She has a normal mood and affect.          Assessment & Plan:   Problem List Items Addressed This Visit      Respiratory   Acute bacterial sinusitis - Primary    In immunocomp pt -some imp on zpak so far Will finish that  If no further improvement in 4-5 d- inst to fill augmentin and take course (or  if worse) Disc symptomatic care - see instructions on AVS  Update if not starting to improve in a week or if worsening        Relevant Medications   amoxicillin-clavulanate (AUGMENTIN) 875-125 MG tablet

## 2015-01-02 NOTE — Assessment & Plan Note (Signed)
In immunocomp pt -some imp on zpak so far Will finish that  If no further improvement in 4-5 d- inst to fill augmentin and take course (or if worse) Disc symptomatic care - see instructions on AVS  Update if not starting to improve in a week or if worsening

## 2015-01-17 ENCOUNTER — Ambulatory Visit (INDEPENDENT_AMBULATORY_CARE_PROVIDER_SITE_OTHER): Payer: Medicare Other | Admitting: Family Medicine

## 2015-01-17 ENCOUNTER — Encounter: Payer: Self-pay | Admitting: Family Medicine

## 2015-01-17 VITALS — BP 136/90 | HR 75 | Temp 97.6°F | Ht 65.5 in | Wt 180.2 lb

## 2015-01-17 DIAGNOSIS — J329 Chronic sinusitis, unspecified: Secondary | ICD-10-CM

## 2015-01-17 MED ORDER — PREDNISONE 10 MG PO TABS: ORAL_TABLET | ORAL | Status: DC

## 2015-01-17 NOTE — Progress Notes (Signed)
Subjective:    Patient ID: Deanna Norris, female    DOB: 03/31/1945, 70 y.o.   MRN: [id number]  HPI Here for sinus symptoms   She took the augmentin after the last visit (had zpak before that) Felt a whole lot better-overall  Still having sinus headaches however - head and face   Fairly clear - not a lot of nasal drainage-what is there is clear   Started some ibuprofen -and that helps a lot   Also going to travel and take care of grandkids on Friday  Noted she is still coughing also   She used nasal saline spray - it burns however  Now using it regularly  Uses flonase daily  No longer on mucinex   Went back on RA med last week   Feels itchy - but no rash- ? Dry skin   Patient Active Problem List   Diagnosis Date Noted  . Abnormal TSH 03/13/2014  . Coronary artery calcification seen on CT scan 11/17/2013  . Recurrent sinusitis 09/16/2013  . Acute bacterial sinusitis 08/29/2013  . Encounter for Medicare annual wellness exam 01/31/2013  . Dyspnea 09/14/2012  . Nodule of right lung 09/14/2012  . 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 Hyperlipidemia 05/08/2009  . 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 ASTHMA 05/08/2009  . GERD 05/08/2009  . MENOPAUSAL DISORDER 05/08/2009  . ARTHRITIS, RHEUMATOID 05/08/2009  . Osteoporosis 05/08/2009  . SPONDYLOLISTHESIS 05/08/2009  . Sleep apnea 05/08/2009   Past Medical History  Diagnosis Date  . Osteoporosis   . Menopausal symptoms   . Postmenopausal HRT (hormone replacement therapy)   . RA (rheumatoid arthritis) ([place]) 11/2004    methotrexate and Embrel  . Vitamin D deficiency   . Insomnia   . GERD (gastroesophageal reflux disease)   . Asthma   . Fibroid   . Sleep apnea 2008    C-Pap   Past Surgical History  Procedure Laterality Date  . Vaginal delivery      x3  . Cystocele repair  10/2007    with graft  . Abdominal hysterectomy  01/1993    partial with AP repair, secondary to fibroids  . Breast surgery  1991    breast biopsy    . Melanoma excision Right 01/2012    excision right posterior arm  . Nasal sinus surgery  07/1991   Social History  Substance Use Topics  . Smoking status: Never Smoker   . Smokeless tobacco: Never Used  . Alcohol Use: No   Family History  Problem Relation Age of Onset  . Breast cancer Sister 70  . Thyroid disease Sister    Allergies  Allergen Reactions  . Adalimumab     REACTION: frequent sinus infection  . Diclofenac     unk  . Dicyclomine     Turn teeth gray  . Doxycycline Hyclate Other (See Comments)    "turns teeth gray"  . Fosamax [Alendronate Sodium]     Acid reflux  . Methotrexate Derivatives Nausea Only    Oral metotrexate  . Nickel   . Piroxicam     unk  . Salagen [Pilocarpine Hcl] Other (See Comments)    Excessive sweating, headaches  . Sulfamethoxazole-Trimethoprim     REACTION: face and hands rash and felt burning sensation  . Tramadol Hcl     REACTION: nausea  . Plaquenil [Hydroxychloroquine] Rash   Current Outpatient Prescriptions on File Prior to Visit  Medication Sig Dispense Refill  . Abatacept (ORENCIA CLICKJECT [place]) Inject into the  skin every 7 (seven) days.    Marland Kitchen acetaminophen (TYLENOL) 325 MG tablet Take 650 mg by mouth every 6 (six) hours as needed.    Marland Kitchen albuterol (PROAIR HFA) 108 (90 BASE) MCG/ACT inhaler Inhale 2 puffs into the lungs every 4 (four) hours as needed. 1 Inhaler 3  . aspirin 81 MG tablet Take 81 mg by mouth daily.     . Calcium Citrate-Vitamin D (CITRACAL/VITAMIN D PO) Take 1 tablet by mouth daily.      . Cholecalciferol (VITAMIN D3) 2000 UNITS TABS Take 1 tablet by mouth daily.      . Estradiol 10 MCG TABS vaginal tablet Place 1 tablet (10 mcg total) vaginally 2 (two) times a week. 24 tablet 3  . fluticasone (FLONASE) 50 MCG/ACT nasal spray Place 2 sprays into both nostrils daily as needed for rhinitis or allergies. (Patient taking differently: Place 2 sprays into both nostrils daily. ) 48 g 3  . Folic Acid-Vit Q000111Q 123456 (FOLGARD  RX) 2.2-25-1 MG TABS Take 1 tablet by mouth daily.      . hydrocortisone 2.5 % ointment Apply 1 application topically as needed.     . methotrexate 25 MG/ML SOLN Inject 1 ml under the skin every week     . metroNIDAZOLE (METROGEL) 0.75 % gel Apply 1 application topically 2 (two) times daily.    . Multiple Vitamin (MULTIVITAMIN) capsule Take 1 capsule by mouth daily.      . ranitidine (ZANTAC) 300 MG tablet Take 300 mg by mouth 2 (two) times daily.     Marland Kitchen zolpidem (AMBIEN) 5 MG tablet Take 2.5-5 mg by mouth at bedtime as needed for sleep.      No current facility-administered medications on file prior to visit.    Review of Systems Review of Systems  Constitutional: Negative for fever, appetite change,  and unexpected weight change.  ENt pos for congestion and neg for purulent nasal drainage, pos for sinus headache Eyes: Negative for pain and visual disturbance.  Respiratory: Negative for wheeze  and shortness of breath.   Cardiovascular: Negative for cp or palpitations    Gastrointestinal: Negative for nausea, diarrhea and constipation.  Genitourinary: Negative for urgency and frequency.  Skin: Negative for pallor or rash   Neurological: Negative for weakness, light-headedness, numbness and headaches.  Hematological: Negative for adenopathy. Does not bruise/bleed easily.  Psychiatric/Behavioral: Negative for dysphoric mood. The patient is not nervous/anxious.         Objective:   Physical Exam  Constitutional: She appears well-developed and well-nourished. No distress.  overwt and well app  HENT:  Head: Normocephalic and atraumatic.  Right Ear: External ear normal.  Left Ear: External ear normal.  Mouth/Throat: Oropharynx is clear and moist. No oropharyngeal exudate.  Nares are injected and congested (slt improved) Bilateral frontal  sinus tenderness , mild maxillary tenderness Post nasal drip   Eyes: Conjunctivae and EOM are normal. Pupils are equal, round, and reactive to  light. Right eye exhibits no discharge. Left eye exhibits no discharge.  Neck: Normal range of motion. Neck supple.  Cardiovascular: Normal rate and regular rhythm.   Pulmonary/Chest: Effort normal and breath sounds normal. No respiratory distress. She has no wheezes. She has no rales.  Lymphadenopathy:    She has no cervical adenopathy.  Neurological: She is alert. No cranial nerve deficit.  Skin: Skin is warm and dry. No rash noted.  Psychiatric: She has a normal mood and affect.          Assessment &  Plan:   Problem List Items Addressed This Visit      Respiratory   Recurrent sinusitis - Primary    S/p 2 rounds of abx (azith and augmentin)  Now just sinus headache and drainage - suspect residual inflammation (imp with ibuprofen) tx with short taper of prednisone Disc symptomatic care - see instructions on AVS  Update if not starting to improve in a week or if worsening  - would consider CT of sinuses if necessary      Relevant Medications   predniSONE (DELTASONE) 10 MG tablet

## 2015-01-17 NOTE — Patient Instructions (Signed)
I think you have residual sinus swelling after your infection  Take prednisone as directed (it will make you hyper and hungry)  Use nasal saline or netti pot - whenever you can  Breathe steam  Try warm compresses on face   Update if not starting to improve in a week or if worsening  - we may consider a CT of the sinuses

## 2015-01-17 NOTE — Assessment & Plan Note (Signed)
S/p 2 rounds of abx (azith and augmentin)  Now just sinus headache and drainage - suspect residual inflammation (imp with ibuprofen) tx with short taper of prednisone Disc symptomatic care - see instructions on AVS  Update if not starting to improve in a week or if worsening  - would consider CT of sinuses if necessary

## 2015-01-17 NOTE — Progress Notes (Signed)
Pre visit review using our clinic review tool, if applicable. No additional management support is needed unless otherwise documented below in the visit note. 

## 2015-01-29 DIAGNOSIS — Z79899 Other long term (current) drug therapy: Secondary | ICD-10-CM | POA: Diagnosis not present

## 2015-01-30 DIAGNOSIS — M79641 Pain in right hand: Secondary | ICD-10-CM | POA: Diagnosis not present

## 2015-01-30 DIAGNOSIS — Z79899 Other long term (current) drug therapy: Secondary | ICD-10-CM | POA: Diagnosis not present

## 2015-01-30 DIAGNOSIS — M79671 Pain in right foot: Secondary | ICD-10-CM | POA: Diagnosis not present

## 2015-01-30 DIAGNOSIS — M0579 Rheumatoid arthritis with rheumatoid factor of multiple sites without organ or systems involvement: Secondary | ICD-10-CM | POA: Diagnosis not present

## 2015-02-07 DIAGNOSIS — Z79899 Other long term (current) drug therapy: Secondary | ICD-10-CM | POA: Diagnosis not present

## 2015-03-08 DIAGNOSIS — H0265 Xanthelasma of left lower eyelid: Secondary | ICD-10-CM | POA: Diagnosis not present

## 2015-03-08 DIAGNOSIS — C44719 Basal cell carcinoma of skin of left lower limb, including hip: Secondary | ICD-10-CM | POA: Diagnosis not present

## 2015-03-08 DIAGNOSIS — Z85820 Personal history of malignant melanoma of skin: Secondary | ICD-10-CM | POA: Diagnosis not present

## 2015-03-08 DIAGNOSIS — D225 Melanocytic nevi of trunk: Secondary | ICD-10-CM | POA: Diagnosis not present

## 2015-03-08 DIAGNOSIS — D2271 Melanocytic nevi of right lower limb, including hip: Secondary | ICD-10-CM | POA: Diagnosis not present

## 2015-03-08 DIAGNOSIS — D485 Neoplasm of uncertain behavior of skin: Secondary | ICD-10-CM | POA: Diagnosis not present

## 2015-03-08 DIAGNOSIS — D2272 Melanocytic nevi of left lower limb, including hip: Secondary | ICD-10-CM | POA: Diagnosis not present

## 2015-03-08 DIAGNOSIS — L812 Freckles: Secondary | ICD-10-CM | POA: Diagnosis not present

## 2015-03-08 DIAGNOSIS — D1801 Hemangioma of skin and subcutaneous tissue: Secondary | ICD-10-CM | POA: Diagnosis not present

## 2015-03-18 ENCOUNTER — Telehealth: Payer: Self-pay | Admitting: Family Medicine

## 2015-03-18 DIAGNOSIS — R7989 Other specified abnormal findings of blood chemistry: Secondary | ICD-10-CM

## 2015-03-18 DIAGNOSIS — E785 Hyperlipidemia, unspecified: Secondary | ICD-10-CM

## 2015-03-18 NOTE — Telephone Encounter (Signed)
-----   Message from Marchia Bond sent at 03/15/2015  1:19 PM EST ----- Regarding: Cpx labs Fri 3/17, need orders. Thanks! :-) Please order  future cpx labs for pt's upcoming lab appt. Thanks Aniceto Boss

## 2015-03-23 ENCOUNTER — Other Ambulatory Visit (INDEPENDENT_AMBULATORY_CARE_PROVIDER_SITE_OTHER): Payer: Medicare Other

## 2015-03-23 DIAGNOSIS — E785 Hyperlipidemia, unspecified: Secondary | ICD-10-CM | POA: Diagnosis not present

## 2015-03-23 DIAGNOSIS — R7989 Other specified abnormal findings of blood chemistry: Secondary | ICD-10-CM | POA: Diagnosis not present

## 2015-03-23 LAB — COMPREHENSIVE METABOLIC PANEL
ALK PHOS: 83 U/L (ref 39–117)
ALT: 33 U/L (ref 0–35)
AST: 33 U/L (ref 0–37)
Albumin: 3.9 g/dL (ref 3.5–5.2)
BILIRUBIN TOTAL: 0.6 mg/dL (ref 0.2–1.2)
BUN: 15 mg/dL (ref 6–23)
CALCIUM: 9.3 mg/dL (ref 8.4–10.5)
CO2: 31 meq/L (ref 19–32)
CREATININE: 0.68 mg/dL (ref 0.40–1.20)
Chloride: 104 mEq/L (ref 96–112)
GFR: 91.07 mL/min (ref >60.00)
GLUCOSE: 94 mg/dL (ref 70–99)
Potassium: 4.4 mEq/L (ref 3.5–5.1)
Sodium: 140 mEq/L (ref 135–145)
TOTAL PROTEIN: 6.7 g/dL (ref 6.0–8.3)

## 2015-03-23 LAB — CBC WITH DIFFERENTIAL/PLATELET
BASOS ABS: 0.1 10*3/uL (ref 0.0–0.1)
Basophils Relative: 0.7 % (ref 0.0–3.0)
EOS ABS: 0.5 10*3/uL (ref 0.0–0.7)
Eosinophils Relative: 5.9 % — ABNORMAL HIGH (ref 0.0–5.0)
HEMATOCRIT: 46.5 % — AB (ref 36.0–46.0)
Hemoglobin: 15.8 g/dL — ABNORMAL HIGH (ref 12.0–15.0)
LYMPHS PCT: 30.2 % (ref 12.0–46.0)
Lymphs Abs: 2.5 10*3/uL (ref 0.7–4.0)
MCHC: 33.9 g/dL (ref 30.0–36.0)
MCV: 93.7 fl (ref 78.0–100.0)
MONOS PCT: 7.8 % (ref 3.0–12.0)
Monocytes Absolute: 0.6 10*3/uL (ref 0.1–1.0)
NEUTROS ABS: 4.5 10*3/uL (ref 1.4–7.7)
Neutrophils Relative %: 55.4 % (ref 43.0–77.0)
PLATELETS: 269 10*3/uL (ref 150.0–400.0)
RBC: 4.97 Mil/uL (ref 3.87–5.11)
RDW: 15.7 % — ABNORMAL HIGH (ref 11.5–15.5)
WBC: 8.2 10*3/uL (ref 4.0–10.5)

## 2015-03-23 LAB — TSH: TSH: 1.86 u[IU]/mL (ref 0.35–4.50)

## 2015-03-23 LAB — LIPID PANEL
CHOL/HDL RATIO: 3
Cholesterol: 211 mg/dL — ABNORMAL HIGH (ref 0–200)
HDL: 65.6 mg/dL (ref >39.00)
LDL Cholesterol: 119 mg/dL — ABNORMAL HIGH (ref 0–99)
NonHDL: 145.55
TRIGLYCERIDES: 133 mg/dL (ref 0.0–149.0)
VLDL: 26.6 mg/dL (ref 0.0–40.0)

## 2015-03-28 ENCOUNTER — Ambulatory Visit (INDEPENDENT_AMBULATORY_CARE_PROVIDER_SITE_OTHER): Payer: Medicare Other | Admitting: Family Medicine

## 2015-03-28 ENCOUNTER — Encounter: Payer: Self-pay | Admitting: Family Medicine

## 2015-03-28 ENCOUNTER — Ambulatory Visit (INDEPENDENT_AMBULATORY_CARE_PROVIDER_SITE_OTHER): Payer: Medicare Other

## 2015-03-28 VITALS — BP 118/82 | HR 66 | Temp 97.7°F | Ht 66.0 in | Wt 177.8 lb

## 2015-03-28 DIAGNOSIS — K219 Gastro-esophageal reflux disease without esophagitis: Secondary | ICD-10-CM | POA: Diagnosis not present

## 2015-03-28 DIAGNOSIS — Z23 Encounter for immunization: Secondary | ICD-10-CM | POA: Diagnosis not present

## 2015-03-28 DIAGNOSIS — M810 Age-related osteoporosis without current pathological fracture: Secondary | ICD-10-CM

## 2015-03-28 DIAGNOSIS — Z0000 Encounter for general adult medical examination without abnormal findings: Secondary | ICD-10-CM | POA: Diagnosis not present

## 2015-03-28 DIAGNOSIS — E785 Hyperlipidemia, unspecified: Secondary | ICD-10-CM

## 2015-03-28 DIAGNOSIS — M069 Rheumatoid arthritis, unspecified: Secondary | ICD-10-CM

## 2015-03-28 NOTE — Progress Notes (Signed)
   Subjective:    Patient ID: Deanna Norris, female    DOB: Jan 27, 1945, 70 y.o.   MRN: [id number]  HPI    Review of Systems     Objective:   Physical Exam        Assessment & Plan:  I reviewed health advisor's note, was available for consultation, and agree with documentation and plan.

## 2015-03-28 NOTE — Patient Instructions (Signed)
Pneumovax today  Then you are up to date  Labs are stable Take care of yourself Stay as active as you can be

## 2015-03-28 NOTE — Progress Notes (Signed)
Pre visit review using our clinic review tool, if applicable. No additional management support is needed unless otherwise documented below in the visit note. 

## 2015-03-28 NOTE — Progress Notes (Signed)
Subjective:    Patient ID: Deanna Norris, female    DOB: 06/14/1945, 70 y.o.   MRN: [id number]  HPI Here for f.u of chronic health problems   Wt is stable  AMW-done today by Katha Cabal- reviewed   Pt had concern about hearing  Her hearing test was fair  Has a further eval set up   Mm 12/16 ok  No breat lumps on self exam  Sees gyn  2015 was last colonoscopy- normal/recall was 10 years   dexa 12/15 - has not had one since then  OP  Years ago fosamax and actonel  No falls or fractures  Still sees her gyn  Takes her vitamin D , does not take ca but gets plenty in her diet   Recent basal cell lesion  Keeping up with dermatologist - Dr Ronnald Ramp  Hx of melanoma in the past  Wears sun protection   RA- is doing pretty well overall  Is able to do what she wants to do -exercise is a challenge - methotrexate  Is active -walking  Also cares for grandchildren  Uses voltaren cream  Not on any oral steroids   Results for orders placed or performed in visit on 03/23/15  CBC with Differential/Platelet  Result Value Ref Range   WBC 8.2 4.0 - 10.5 K/uL   RBC 4.97 3.87 - 5.11 Mil/uL   Hemoglobin 15.8 (H) 12.0 - 15.0 g/dL   HCT 46.5 (H) 36.0 - 46.0 %   MCV 93.7 78.0 - 100.0 fl   MCHC 33.9 30.0 - 36.0 g/dL   RDW 15.7 (H) 11.5 - 15.5 %   Platelets 269.0 150.0 - 400.0 K/uL   Neutrophils Relative % 55.4 43.0 - 77.0 %   Lymphocytes Relative 30.2 12.0 - 46.0 %   Monocytes Relative 7.8 3.0 - 12.0 %   Eosinophils Relative 5.9 (H) 0.0 - 5.0 %   Basophils Relative 0.7 0.0 - 3.0 %   Neutro Abs 4.5 1.4 - 7.7 K/uL   Lymphs Abs 2.5 0.7 - 4.0 K/uL   Monocytes Absolute 0.6 0.1 - 1.0 K/uL   Eosinophils Absolute 0.5 0.0 - 0.7 K/uL   Basophils Absolute 0.1 0.0 - 0.1 K/uL  Comprehensive metabolic panel  Result Value Ref Range   Sodium 140 135 - 145 mEq/L   Potassium 4.4 3.5 - 5.1 mEq/L   Chloride 104 96 - 112 mEq/L   CO2 31 19 - 32 mEq/L   Glucose, Bld 94 70 - 99 mg/dL   BUN 15 6 - 23 mg/dL    Creatinine, Ser 0.68 0.40 - 1.20 mg/dL   Total Bilirubin 0.6 0.2 - 1.2 mg/dL   Alkaline Phosphatase 83 39 - 117 U/L   AST 33 0 - 37 U/L   ALT 33 0 - 35 U/L   Total Protein 6.7 6.0 - 8.3 g/dL   Albumin 3.9 3.5 - 5.2 g/dL   Calcium 9.3 8.4 - 10.5 mg/dL   GFR 91.07 >60.00 mL/min  Lipid panel  Result Value Ref Range   Cholesterol 211 (H) 0 - 200 mg/dL   Triglycerides 133.0 0.0 - 149.0 mg/dL   HDL 65.60 >39.00 mg/dL   VLDL 26.6 0.0 - 40.0 mg/dL   LDL Cholesterol 119 (H) 0 - 99 mg/dL   Total CHOL/HDL Ratio 3    NonHDL 145.55   TSH  Result Value Ref Range   TSH 1.86 0.35 - 4.50 uIU/mL    Abn thyroid test in the past   Lab  Results  Component Value Date   CHOL 211* 03/23/2015   CHOL 190 02/03/2014   CHOL 227* 02/02/2013   Lab Results  Component Value Date   HDL 65.60 03/23/2015   HDL 56.80 02/03/2014   HDL 62.80 02/02/2013   Lab Results  Component Value Date   LDLCALC 119* 03/23/2015   LDLCALC 108* 02/03/2014   Lab Results  Component Value Date   TRIG 133.0 03/23/2015   TRIG 127.0 02/03/2014   TRIG 88.0 02/02/2013   Lab Results  Component Value Date   CHOLHDL 3 03/23/2015   CHOLHDL 3 02/03/2014   CHOLHDL 4 02/02/2013   Lab Results  Component Value Date   LDLDIRECT 144.2 02/02/2013   LDLDIRECT 137.3 02/02/2012   overall stable  Eats a healthy diet   Patient Active Problem List   Diagnosis Date Noted  . Abnormal TSH 03/13/2014  . Coronary artery calcification seen on CT scan 11/17/2013  . Recurrent sinusitis 09/16/2013  . Acute bacterial sinusitis 08/29/2013  . Encounter for Medicare annual wellness exam 01/31/2013  . Dyspnea 09/14/2012  . Nodule of right lung 09/14/2012  . 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 Hyperlipidemia 05/08/2009  . 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 ASTHMA 05/08/2009  . GERD 05/08/2009  . MENOPAUSAL DISORDER 05/08/2009  . ARTHRITIS, RHEUMATOID 05/08/2009  . Osteoporosis 05/08/2009  . SPONDYLOLISTHESIS 05/08/2009  . Sleep apnea 05/08/2009    Past Medical History  Diagnosis Date  . Osteoporosis   . Menopausal symptoms   . Postmenopausal HRT (hormone replacement therapy)   . RA (rheumatoid arthritis) ([place]) 11/2004    methotrexate and Embrel  . Vitamin D deficiency   . Insomnia   . GERD (gastroesophageal reflux disease)   . Asthma   . Fibroid   . Sleep apnea 2008    C-Pap   Past Surgical History  Procedure Laterality Date  . Vaginal delivery      x3  . Cystocele repair  10/2007    with graft  . Abdominal hysterectomy  01/1993    partial with AP repair, secondary to fibroids  . Breast surgery  1991    breast biopsy  . Melanoma excision Right 01/2012    excision right posterior arm  . Nasal sinus surgery  07/1991   Social History  Substance Use Topics  . Smoking status: Never Smoker   . Smokeless tobacco: Never Used  . Alcohol Use: No   Family History  Problem Relation Age of Onset  . Breast cancer Sister 70  . Thyroid disease Sister    Allergies  Allergen Reactions  . Adalimumab     REACTION: frequent sinus infection  . Diclofenac     unk  . Dicyclomine     Turn teeth gray  . Doxycycline Hyclate Other (See Comments)    "turns teeth gray"  . Fosamax [Alendronate Sodium]     Acid reflux  . Methotrexate Derivatives Nausea Only    Oral metotrexate  . Nickel   . Piroxicam     unk  . Salagen [Pilocarpine Hcl] Other (See Comments)    Excessive sweating, headaches  . Sulfamethoxazole-Trimethoprim     REACTION: face and hands rash and felt burning sensation  . Tramadol Hcl     REACTION: nausea  . Plaquenil [Hydroxychloroquine] Rash   Current Outpatient Prescriptions on File Prior to Visit  Medication Sig Dispense Refill  . Abatacept (ORENCIA CLICKJECT [place]) Inject into the skin every 7 (seven) days.    Marland Kitchen albuterol (PROAIR HFA) 108 (90  BASE) MCG/ACT inhaler Inhale 2 puffs into the lungs every 4 (four) hours as needed. 1 Inhaler 3  . aspirin 81 MG tablet Take 81 mg by mouth daily.     . Calcium  Citrate-Vitamin D (CITRACAL/VITAMIN D PO) Take 1 tablet by mouth daily.      . Cholecalciferol (VITAMIN D3) 2000 UNITS TABS Take 1 tablet by mouth daily.      . Estradiol 10 MCG TABS vaginal tablet Place 1 tablet (10 mcg total) vaginally 2 (two) times a week. 24 tablet 3  . fluticasone (FLONASE) 50 MCG/ACT nasal spray Place 2 sprays into both nostrils daily as needed for rhinitis or allergies. (Patient taking differently: Place 2 sprays into both nostrils daily. ) 48 g 3  . Folic Acid-Vit Q000111Q 123456 (FOLGARD RX) 2.2-25-1 MG TABS Take 1 tablet by mouth daily.      . hydrocortisone 2.5 % ointment Apply 1 application topically as needed.     . methotrexate 25 MG/ML SOLN Inject 1 ml under the skin every week     . metroNIDAZOLE (METROGEL) 0.75 % gel Apply 1 application topically 2 (two) times daily.    . Multiple Vitamin (MULTIVITAMIN) capsule Take 1 capsule by mouth daily.      . ranitidine (ZANTAC) 300 MG tablet Take 300 mg by mouth 2 (two) times daily.     Marland Kitchen zolpidem (AMBIEN) 5 MG tablet Take 2.5-5 mg by mouth at bedtime as needed for sleep.      No current facility-administered medications on file prior to visit.    Review of Systems Review of Systems  Constitutional: Negative for fever, appetite change,  and unexpected weight change.  ENT pos for hearing loss  Eyes: Negative for pain and visual disturbance.  Respiratory: Negative for cough and shortness of breath.   Cardiovascular: Negative for cp or palpitations    Gastrointestinal: Negative for nausea, diarrhea and constipation.  Genitourinary: Negative for urgency and frequency.  MSK pos for joint pain from RA Skin: Negative for pallor or rash   Neurological: Negative for weakness, light-headedness, numbness and headaches.  Hematological: Negative for adenopathy. Does not bruise/bleed easily.  Psychiatric/Behavioral: Negative for dysphoric mood. The patient is not nervous/anxious.         Objective:   Physical Exam    Constitutional: She appears well-developed and well-nourished. No distress.  Well appearing   HENT:  Head: Normocephalic and atraumatic.  Right Ear: External ear normal.  Left Ear: External ear normal.  Nose: Nose normal.  Mouth/Throat: Oropharynx is clear and moist.  Eyes: Conjunctivae and EOM are normal. Pupils are equal, round, and reactive to light. Right eye exhibits no discharge. Left eye exhibits no discharge. No scleral icterus.  Neck: Normal range of motion. Neck supple. No JVD present. Carotid bruit is not present. No thyromegaly present.  Cardiovascular: Normal rate, regular rhythm, normal heart sounds and intact distal pulses.  Exam reveals no gallop.   Pulmonary/Chest: Effort normal and breath sounds normal. No respiratory distress. She has no wheezes. She has no rales.  Abdominal: Soft. Bowel sounds are normal. She exhibits no distension and no mass. There is no tenderness.  Musculoskeletal: She exhibits no edema or tenderness.  Joint changes in hands consistent with RA  Lymphadenopathy:    She has no cervical adenopathy.  Neurological: She is alert. She has normal reflexes. No cranial nerve deficit. She exhibits normal muscle tone. Coordination normal.  Skin: Skin is warm and dry. No rash noted. No erythema. No pallor.  Solar lentigines diffusely  Psychiatric: She has a normal mood and affect.          Assessment & Plan:   Problem List Items Addressed This Visit      Digestive   GERD    Controlled by zantac  Rev diet         Musculoskeletal and Integument   Osteoporosis - Primary    12/15 last dexa  Followed by gyn  Disc need for calcium/ vitamin D/ wt bearing exercise and bone density test every 2 y to monitor Disc safety/ fracture risk in detail   Enc regular exercise  No falls or fractures       Rheumatoid arthritis ([place])    On high risk medication  Followed by rheumatology  Exercise is limited but pt continues to stay active and try new things    Update pneumovax 23          Other   Hyperlipidemia    Disc goals for lipids and reasons to control them Rev labs with pt Rev low sat fat diet in detail        Other Visit Diagnoses    Need for 23-polyvalent pneumococcal polysaccharide vaccine        Relevant Orders    Pneumococcal polysaccharide vaccine 23-valent greater than or equal to 2yo subcutaneous/IM (Completed)

## 2015-03-28 NOTE — Progress Notes (Signed)
Subjective:   Deanna Norris is a 70 y.o. female who presents for Medicare Annual (Subsequent) preventive examination.  Cardiac Risk Factors include: advanced age (>77men, >42 women);dyslipidemia     Objective:     Vitals: BP 118/82 mmHg  Pulse 66  Temp(Src) 97.7 F (36.5 C) (Oral)  Ht 5\' 6"  (1.676 m)  Wt 177 lb 12 oz (80.627 kg)  BMI 28.70 kg/m2  SpO2 94%  LMP 01/06/1993  Body mass index is 28.7 kg/(m^2).   Tobacco History  Smoking status  . Never Smoker   Smokeless tobacco  . Never Used     Counseling given: Not Answered   Past Medical History  Diagnosis Date  . Osteoporosis   . Menopausal symptoms   . Postmenopausal HRT (hormone replacement therapy)   . RA (rheumatoid arthritis) ([place]) 11/2004    methotrexate and Embrel  . Vitamin D deficiency   . Insomnia   . GERD (gastroesophageal reflux disease)   . Asthma   . Fibroid   . Sleep apnea 2008    C-Pap   Past Surgical History  Procedure Laterality Date  . Vaginal delivery      x3  . Cystocele repair  10/2007    with graft  . Abdominal hysterectomy  01/1993    partial with AP repair, secondary to fibroids  . Breast surgery  1991    breast biopsy  . Melanoma excision Right 01/2012    excision right posterior arm  . Nasal sinus surgery  07/1991   Family History  Problem Relation Age of Onset  . Breast cancer Sister 85  . Thyroid disease Sister    History  Sexual Activity  . Sexual Activity: Yes  . Birth Control/ Protection: Surgical    Comment: TAH    Outpatient Encounter Prescriptions as of 03/28/2015  Medication Sig  . Abatacept (ORENCIA CLICKJECT [place]) Inject into the skin every 7 (seven) days.  Marland Kitchen albuterol (PROAIR HFA) 108 (90 BASE) MCG/ACT inhaler Inhale 2 puffs into the lungs every 4 (four) hours as needed.  Marland Kitchen aspirin 81 MG tablet Take 81 mg by mouth daily.   . Calcium Citrate-Vitamin D (CITRACAL/VITAMIN D PO) Take 1 tablet by mouth daily.    . Cholecalciferol (VITAMIN D3) 2000 UNITS  TABS Take 1 tablet by mouth daily.    . diclofenac sodium (VOLTAREN) 1 % GEL Apply 1 application topically at bedtime.  . Estradiol 10 MCG TABS vaginal tablet Place 1 tablet (10 mcg total) vaginally 2 (two) times a week.  . fluticasone (FLONASE) 50 MCG/ACT nasal spray Place 2 sprays into both nostrils daily as needed for rhinitis or allergies. (Patient taking differently: Place 2 sprays into both nostrils daily. )  . Folic Acid-Vit Q000111Q 123456 (FOLGARD RX) 2.2-25-1 MG TABS Take 1 tablet by mouth daily.    . hydrocortisone 2.5 % ointment Apply 1 application topically as needed.   . methotrexate 25 MG/ML SOLN Inject 1 ml under the skin every week   . metroNIDAZOLE (METROGEL) 0.75 % gel Apply 1 application topically 2 (two) times daily.  . Multiple Vitamin (MULTIVITAMIN) capsule Take 1 capsule by mouth daily.    . naproxen sodium (ANAPROX) 220 MG tablet Take 220 mg by mouth daily as needed.  . ranitidine (ZANTAC) 300 MG tablet Take 300 mg by mouth 2 (two) times daily.   Marland Kitchen zolpidem (AMBIEN) 5 MG tablet Take 2.5-5 mg by mouth at bedtime as needed for sleep.   . [DISCONTINUED] acetaminophen (TYLENOL) 325 MG tablet  Take 650 mg by mouth every 6 (six) hours as needed.  . [DISCONTINUED] predniSONE (DELTASONE) 10 MG tablet Take 3 pills once daily by mouth for 3 days, then 2 pills once daily for 3 days, then 1 pill once daily for 3 days and then stop   No facility-administered encounter medications on file as of 03/28/2015.    Activities of Daily Living In your present state of health, do you have any difficulty performing the following activities: 03/28/2015  Hearing? Y  Vision? N  Difficulty concentrating or making decisions? N  Walking or climbing stairs? N  Dressing or bathing? N  Doing errands, shopping? N  Preparing Food and eating ? N  Using the Toilet? N  In the past six months, have you accidently leaked urine? N  Do you have problems with loss of bowel control? N  Managing your Medications?  N  Managing your Finances? N  Housekeeping or managing your Housekeeping? N    Patient Care Team: Abner Greenspan, MD as PCP - General  Dr. Audelia Acton - Rheumatology Geryl Rankins, MD - Sleep specialist Other providers have been added to Care Team List    Assessment:     Hearing Screening   125Hz  250Hz  500Hz  1000Hz  2000Hz  4000Hz  8000Hz   Right ear:   40 40 40 0   Left ear:   40 40 40 0   Vision Screening Comments: Last eye exam 05/2014 with Dr. Marica Otter   Exercise Activities and Dietary recommendations Current Exercise Habits: Home exercise routine, Type of exercise: walking, Time (Minutes): 20, Frequency (Times/Week): 5, Weekly Exercise (Minutes/Week): 100, Intensity: Mild, Exercise limited by: None identified  Goals    . Increase physical activity     Starting 03/28/2015, I would like to continue to exercise for 20-30 min 3-4 days per week.       Fall Risk Fall Risk  03/28/2015 02/13/2014 02/09/2013 02/09/2012  Falls in the past year? No No Yes No  Number falls in past yr: - - 2 or more -  Risk Factor Category  - - High Fall Risk -   Depression Screen PHQ 2/9 Scores 03/28/2015 02/13/2014 02/09/2013 02/09/2012  PHQ - 2 Score 0 0 0 0     Cognitive Testing MMSE - Mini Mental State Exam 03/28/2015  Orientation to time 5/5  Orientation to Place 5/5  Registration 3/3  Attention/ Calculation 0  Recall 3/3  Language- name 2 objects 0  Language- repeat 1/1  Language- follow 3 step command 3/3  Language- read & follow direction 0  Write a sentence 0  Copy design 0  Total score 20/20   PLEASE NOTE: A Mini-Cog screen was completed. A value of 0 denotes this part of the full Folstein assessment was not completed.   Immunization History  Administered Date(s) Administered  . DTaP 04/06/2013  . Influenza Split 10/22/2010  . Influenza Whole 10/10/2009, 10/20/2011  . Influenza, Seasonal, Injecte, Preservative Fre 10/06/2012  . Influenza,inj,Quad PF,36+ Mos 10/30/2014  .  Influenza-Unspecified 10/26/2013  . Pneumococcal Conjugate-13 02/13/2014  . Pneumococcal Polysaccharide-23 06/12/2010  . Td 02/07/2003, 03/23/2013  . Tdap 03/23/2013  . Zoster 05/08/2014   Screening Tests Health Maintenance  Topic Date Due  . Hepatitis C Screening  03/27/2016 (Originally 21-Jul-1945)  . PNA vac Low Risk Adult (2 of 2 - PPSV23) 06/12/2015  . INFLUENZA VACCINE  08/07/2015  . MAMMOGRAM  12/20/2015  . COLONOSCOPY  01/13/2023  . TETANUS/TDAP  03/24/2023  . DEXA SCAN  Completed  .  ZOSTAVAX  Completed      Plan:     I have personally reviewed the Medicare Annual Wellness questionnaire and have noted the following in the patient's chart:  A. Medical and social history B. Use of alcohol, tobacco or illicit drugs  C. Current medications and supplements D. Functional ability and status E.  Nutritional status F.  Physical activity G. Advance directives H. List of other physicians I.  Hospitalizations, surgeries, and ER visits in previous 12 months J.  [place] to include hearing, vision, cognitive, depression L. Referrals and appointments - none  In addition, I reviewed preventive protocols, quality metrics, and best practice recommendations specific to patient. A written personalized care plan for preventive services as well as general preventive health recommendations were provided to patient.  See attached scanned questionnaire for additional information.   Signed,   Lindell Noe, MHA, BS, LPN Health Advisor [id number]

## 2015-03-28 NOTE — Patient Instructions (Signed)
Deanna Norris , Thank you for taking time to come for your Medicare Wellness Visit. I appreciate your ongoing commitment to your health goals. Please review the following plan we discussed and let me know if I can assist you in the future.   These are the goals we discussed: Goals    . Increase physical activity     Starting 03/28/2015, I would like to continue to exercise for 20-30 min 3-4 days per week.        This is a list of the screening recommended for you and due dates:  Health Maintenance  Topic Date Due  .  Hepatitis C: One time screening is recommended by Center for Disease Control  (CDC) for  adults born from [birth year] through 1965.   03/27/2016*  . Pneumonia vaccines (2 of 2 - PPSV23) 06/12/2015  . Flu Shot  08/07/2015  . Mammogram  12/20/2015  . Colon Cancer Screening  01/13/2023  . Tetanus Vaccine  03/24/2023  . DEXA scan (bone density measurement)  Completed  . Shingles Vaccine  Completed  *Topic was postponed. The date shown is not the original due date.   Preventive Care for Adults  A healthy lifestyle and preventive care can promote health and wellness. Preventive health guidelines for adults include the following key practices.  . A routine yearly physical is a good way to check with your health care provider about your health and preventive screening. It is a chance to share any concerns and updates on your health and to receive a thorough exam.  . Visit your dentist for a routine exam and preventive care every 6 months. Brush your teeth twice a day and floss once a day. Good oral hygiene prevents tooth decay and gum disease.  . The frequency of eye exams is based on your age, health, family medical history, use  of contact lenses, and other factors. Follow your health care provider's ecommendations for frequency of eye exams.  . Eat a healthy diet. Foods like vegetables, fruits, whole grains, low-fat dairy products, and lean protein foods contain the nutrients you  need without too many calories. Decrease your intake of foods high in solid fats, added sugars, and salt. Eat the right amount of calories for you. Get information about a proper diet from your health care provider, if necessary.  . Regular physical exercise is one of the most important things you can do for your health. Most adults should get at least 150 minutes of moderate-intensity exercise (any activity that increases your heart rate and causes you to sweat) each week. In addition, most adults need muscle-strengthening exercises on 2 or more days a week.  Silver Sneakers may be a benefit available to you. To determine eligibility, you may visit the website: www.silversneakers.com or contact program at 301-150-2269 Mon-Fri between 8AM-8PM.   . Maintain a healthy weight. The body mass index (BMI) is a screening tool to identify possible weight problems. It provides an estimate of body fat based on height and weight. Your health care provider can find your BMI and can help you achieve or maintain a healthy weight.   For adults 20 years and older: ? A BMI below 18.5 is considered underweight. ? A BMI of 18.5 to 24.9 is normal. ? A BMI of 25 to 29.9 is considered overweight. ? A BMI of 30 and above is considered obese.   . Maintain normal blood lipids and cholesterol levels by exercising and minimizing your intake of saturated fat. Eat a  balanced diet with plenty of fruit and vegetables. Blood tests for lipids and cholesterol should begin at age 53 and be repeated every 5 years. If your lipid or cholesterol levels are high, you are over 50, or you are at high risk for heart disease, you may need your cholesterol levels checked more frequently. Ongoing high lipid and cholesterol levels should be treated with medicines if diet and exercise are not working.  . If you smoke, find out from your health care provider how to quit. If you do not use tobacco, please do not start.  . If you choose to drink  alcohol, please do not consume more than 2 drinks per day. One drink is considered to be 12 ounces (355 mL) of beer, 5 ounces (148 mL) of wine, or 1.5 ounces (44 mL) of liquor.  . If you are 66-31 years old, ask your health care provider if you should take aspirin to prevent strokes.  . Use sunscreen. Apply sunscreen liberally and repeatedly throughout the day. You should seek shade when your shadow is shorter than you. Protect yourself by wearing long sleeves, pants, a wide-brimmed hat, and sunglasses year round, whenever you are outdoors.  . Once a month, do a whole body skin exam, using a mirror to look at the skin on your back. Tell your health care provider of new moles, moles that have irregular borders, moles that are larger than a pencil eraser, or moles that have changed in shape or color.

## 2015-03-29 DIAGNOSIS — M069 Rheumatoid arthritis, unspecified: Secondary | ICD-10-CM | POA: Insufficient documentation

## 2015-03-29 NOTE — Assessment & Plan Note (Signed)
Controlled by zantac  Rev diet

## 2015-03-29 NOTE — Assessment & Plan Note (Signed)
12/15 last dexa  Followed by gyn  Disc need for calcium/ vitamin D/ wt bearing exercise and bone density test every 2 y to monitor Disc safety/ fracture risk in detail   Enc regular exercise  No falls or fractures

## 2015-03-29 NOTE — Assessment & Plan Note (Signed)
Disc goals for lipids and reasons to control them Rev labs with pt Rev low sat fat diet in detail   

## 2015-03-29 NOTE — Assessment & Plan Note (Signed)
On high risk medication  Followed by rheumatology  Exercise is limited but pt continues to stay active and try new things   Update pneumovax 23

## 2015-04-05 DIAGNOSIS — H903 Sensorineural hearing loss, bilateral: Secondary | ICD-10-CM | POA: Diagnosis not present

## 2015-04-11 DIAGNOSIS — Z79899 Other long term (current) drug therapy: Secondary | ICD-10-CM | POA: Diagnosis not present

## 2015-04-19 ENCOUNTER — Other Ambulatory Visit: Payer: Self-pay | Admitting: Family Medicine

## 2015-04-24 DIAGNOSIS — M79641 Pain in right hand: Secondary | ICD-10-CM | POA: Diagnosis not present

## 2015-04-24 DIAGNOSIS — M174 Other bilateral secondary osteoarthritis of knee: Secondary | ICD-10-CM | POA: Diagnosis not present

## 2015-04-24 DIAGNOSIS — Z09 Encounter for follow-up examination after completed treatment for conditions other than malignant neoplasm: Secondary | ICD-10-CM | POA: Diagnosis not present

## 2015-04-24 DIAGNOSIS — M0579 Rheumatoid arthritis with rheumatoid factor of multiple sites without organ or systems involvement: Secondary | ICD-10-CM | POA: Diagnosis not present

## 2015-04-25 DIAGNOSIS — Z9225 Personal history of immunosupression therapy: Secondary | ICD-10-CM | POA: Diagnosis not present

## 2015-05-09 DIAGNOSIS — M19042 Primary osteoarthritis, left hand: Secondary | ICD-10-CM | POA: Diagnosis not present

## 2015-05-09 DIAGNOSIS — M19071 Primary osteoarthritis, right ankle and foot: Secondary | ICD-10-CM | POA: Diagnosis not present

## 2015-05-09 DIAGNOSIS — M19072 Primary osteoarthritis, left ankle and foot: Secondary | ICD-10-CM | POA: Diagnosis not present

## 2015-05-09 DIAGNOSIS — M19041 Primary osteoarthritis, right hand: Secondary | ICD-10-CM | POA: Diagnosis not present

## 2015-05-24 DIAGNOSIS — Z09 Encounter for follow-up examination after completed treatment for conditions other than malignant neoplasm: Secondary | ICD-10-CM | POA: Diagnosis not present

## 2015-05-24 DIAGNOSIS — M0579 Rheumatoid arthritis with rheumatoid factor of multiple sites without organ or systems involvement: Secondary | ICD-10-CM | POA: Diagnosis not present

## 2015-05-24 DIAGNOSIS — M1712 Unilateral primary osteoarthritis, left knee: Secondary | ICD-10-CM | POA: Diagnosis not present

## 2015-05-24 DIAGNOSIS — M79641 Pain in right hand: Secondary | ICD-10-CM | POA: Diagnosis not present

## 2015-06-08 DIAGNOSIS — M170 Bilateral primary osteoarthritis of knee: Secondary | ICD-10-CM | POA: Diagnosis not present

## 2015-06-15 DIAGNOSIS — M170 Bilateral primary osteoarthritis of knee: Secondary | ICD-10-CM | POA: Diagnosis not present

## 2015-06-22 DIAGNOSIS — M170 Bilateral primary osteoarthritis of knee: Secondary | ICD-10-CM | POA: Diagnosis not present

## 2015-06-29 DIAGNOSIS — H04123 Dry eye syndrome of bilateral lacrimal glands: Secondary | ICD-10-CM | POA: Diagnosis not present

## 2015-07-26 DIAGNOSIS — Z79899 Other long term (current) drug therapy: Secondary | ICD-10-CM | POA: Diagnosis not present

## 2015-07-29 DIAGNOSIS — J029 Acute pharyngitis, unspecified: Secondary | ICD-10-CM | POA: Diagnosis not present

## 2015-08-24 ENCOUNTER — Encounter: Payer: Self-pay | Admitting: Family Medicine

## 2015-08-24 ENCOUNTER — Ambulatory Visit (INDEPENDENT_AMBULATORY_CARE_PROVIDER_SITE_OTHER): Payer: Medicare Other | Admitting: Family Medicine

## 2015-08-24 VITALS — BP 128/86 | HR 63 | Temp 97.8°F | Wt 168.5 lb

## 2015-08-24 DIAGNOSIS — K219 Gastro-esophageal reflux disease without esophagitis: Secondary | ICD-10-CM | POA: Diagnosis not present

## 2015-08-24 DIAGNOSIS — M069 Rheumatoid arthritis, unspecified: Secondary | ICD-10-CM

## 2015-08-24 DIAGNOSIS — T700XXA Otitic barotrauma, initial encounter: Secondary | ICD-10-CM

## 2015-08-24 DIAGNOSIS — R070 Pain in throat: Secondary | ICD-10-CM | POA: Diagnosis not present

## 2015-08-24 NOTE — Patient Instructions (Signed)
Please let me know if you would like a referral to the ENT  Barotitis Media Barotitis media is inflammation of your middle ear. This occurs when the auditory tube (eustachian tube) leading from the back of your nose (nasopharynx) to your eardrum is blocked. This blockage may result from a cold, environmental allergies, or an upper respiratory infection. Unresolved barotitis media may lead to damage or hearing loss (barotrauma), which may become permanent. HOME CARE INSTRUCTIONS   Use medicines as recommended by your health care provider. Over-the-counter medicines will help unblock the canal and can help during times of air travel.  Do not put anything into your ears to clean or unplug them. Eardrops will not be helpful.  Do not swim, dive, or fly until your health care provider says it is all right to do so. If these activities are necessary, chewing gum with frequent, forceful swallowing may help. It is also helpful to hold your nose and gently blow to pop your ears for equalizing pressure changes. This forces air into the eustachian tube.  Only take over-the-counter or prescription medicines for pain, discomfort, or fever as directed by your health care provider.  A decongestant may be helpful in decongesting the middle ear and make pressure equalization easier. SEEK MEDICAL CARE IF:  You experience a serious form of dizziness in which you feel as if the room is spinning and you feel nauseated (vertigo).  Your symptoms only involve one ear. SEEK IMMEDIATE MEDICAL CARE IF:   You develop a severe headache, dizziness, or severe ear pain.  You have bloody or pus-like drainage from your ears.  You develop a fever.  Your problems do not improve or become worse. MAKE SURE YOU:   Understand these instructions.  Will watch your condition.  Will get help right away if you are not doing well or get worse.   This information is not intended to replace advice given to you by your health  care provider. Make sure you discuss any questions you have with your health care provider.   Document Released: 12/21/1999 Document Revised: 10/13/2012 Document Reviewed: 07/20/2012 Elsevier Interactive Patient Education [employer].

## 2015-08-24 NOTE — Progress Notes (Signed)
Pre-visit discussion using our clinic review tool. No additional management support is needed unless otherwise documented below in the visit note.  

## 2015-08-24 NOTE — Progress Notes (Signed)
Subjective:    Patient ID: Deanna Norris, female    DOB: 1945/10/01, 70 y.o.   MRN: [id number]  HPI This is a 70 yo female who presents today with sore throat and left sided ear pain. She has a history of RA and has noticed her throat bothers her more when she does not take her RA medication and when she is tired. She has not taken her methotrexate or Enebrel much in last two months due to being around sick grandchildren. Has had several months of throat pain, pain is not present in the morning when she awakens and comes on as the day progresses, starting in the afternoon. She reports that she had a negative throat culture in early August which was done to rule out strep. She has taken some intermittent Alleve, but is not sure if it helped. She gets relief with cold liquids. She has not had any post nasal drainage. She has seasonal allergies and uses Flonase daily, year round. She has a long standing history of GERD and takes ranitidine 150 mg BID with good results. When she is symptomatic, it is usually at the end of the day. No cough, no fever. No nasal drainage or congestion.   She has recently had some intermittent, sharp left sided ear pain. Lasts a few seconds, sometimes radiates toward temple. No discernable pattern. No drainage.   Her husband is having a knee replacement next week and she wanted to get checked out to make sure she doesn't have anything contagious. Her grandson had Hand Foot Mouth disease last week. She has not had a rash or significant myalgias.    Past Medical History:  Diagnosis Date  . Asthma   . Fibroid   . GERD (gastroesophageal reflux disease)   . Insomnia   . Menopausal symptoms   . Osteoporosis   . Postmenopausal HRT (hormone replacement therapy)   . RA (rheumatoid arthritis) ([place]) 11/2004   methotrexate and Embrel  . Sleep apnea 2008   C-Pap  . Vitamin D deficiency    Past Surgical History:  Procedure Laterality Date  . ABDOMINAL HYSTERECTOMY   01/1993   partial with AP repair, secondary to fibroids  . BREAST SURGERY  1991   breast biopsy  . CYSTOCELE REPAIR  10/2007   with graft  . MELANOMA EXCISION Right 01/2012   excision right posterior arm  . NASAL SINUS SURGERY  07/1991  . VAGINAL DELIVERY     x3   Family History  Problem Relation Age of Onset  . Breast cancer Sister 30  . Thyroid disease Sister    Social History  Substance Use Topics  . Smoking status: Never Smoker  . Smokeless tobacco: Never Used  . Alcohol use No       Review of Systems Per HPI    Objective:   Physical Exam  Constitutional: She appears well-developed and well-nourished. No distress.  HENT:  Head: Normocephalic and atraumatic.  Right Ear: External ear and ear canal normal.  Left Ear: External ear and ear canal normal.  Nose: Nose normal.  Mouth/Throat: Posterior oropharyngeal erythema (mild) present. No oropharyngeal exudate, posterior oropharyngeal edema or tonsillar abscesses.  Bilateral TMs opaque. Some crepitus right TMJ.   Eyes: Conjunctivae are normal.  Cardiovascular: Normal rate, regular rhythm and normal heart sounds.   Pulmonary/Chest: Effort normal and breath sounds normal.  Lymphadenopathy:    She has no cervical adenopathy.  Skin: Skin is warm and dry. She is not diaphoretic.  Psychiatric:  She has a normal mood and affect. Her behavior is normal. Judgment and thought content normal.  Vitals reviewed.     BP 128/86 (BP Location: Left Arm, Patient Position: Sitting, Cuff Size: Normal)   Pulse 63   Temp 97.8 F (36.6 C) (Oral)   Wt 168 lb 8 oz (76.4 kg)   LMP 01/06/1993   SpO2 96%   BMI 27.20 kg/m      Assessment & Plan:  1. Throat pain - discussed possible causes including sinus drainage, acid reflux and treatments - given duration, I offered her ENT referral. She declined at this time stating that this is not uncommon for her and she feels symptoms closely related to her RA meds.  - she can let me know at  any time if she changes her mind and would like referral  2. Barotitis media, initial encounter - Provided written and verbal information regarding diagnosis and treatment. Resume flonase.  3. Gastroesophageal reflux disease, esophagitis presence not specified - she is having some break through symptoms in the afternoons which may correspond to throat pain. She would prefer to continue ranitidine and we discussed the option of increasing morning dose to 300 mg  4. Rheumatoid arthritis involving multiple sites, unspecified rheumatoid factor presence ([place]) - encouraged her to resume her meds and she will continue follow up with rheumatology   Clarene Reamer, FNP-BC  [facility] at [facility], [place]  08/25/2015 7:04 AM

## 2015-08-31 ENCOUNTER — Ambulatory Visit (INDEPENDENT_AMBULATORY_CARE_PROVIDER_SITE_OTHER): Payer: Medicare Other | Admitting: Family Medicine

## 2015-08-31 ENCOUNTER — Encounter: Payer: Self-pay | Admitting: Family Medicine

## 2015-08-31 VITALS — BP 108/78 | HR 63 | Temp 97.5°F | Wt 168.0 lb

## 2015-08-31 DIAGNOSIS — B029 Zoster without complications: Secondary | ICD-10-CM | POA: Diagnosis not present

## 2015-08-31 DIAGNOSIS — B0230 Zoster ocular disease, unspecified: Secondary | ICD-10-CM | POA: Diagnosis not present

## 2015-08-31 MED ORDER — VALACYCLOVIR HCL 1 G PO TABS: 1000.0000 mg | ORAL_TABLET | Freq: Three times a day (TID) | ORAL | 0 refills | Status: DC

## 2015-08-31 NOTE — Progress Notes (Signed)
Subjective:    Patient ID: Deanna Norris, female    DOB: 1945/11/30, 70 y.o.   MRN: [id number]  HPI This is a pleasant 70 yo female who presents today with left sided eyebrow and forehead soreness for a couple of days and fatigue. Noticed rash above right eye and on right eye lid this morning, area feels sensitive. No visual changes. No fever. Is concerned about shingles. Has had shingles vaccine. She attributed fatigue to taking care of her husband who has had recent TKR. He is doing well with home rehab.  She was seen 08/24/15 with pharyngitis, barotitis media, uncontrolled GERD- these symptoms improved.    Past Medical History:  Diagnosis Date  . Asthma   . Fibroid   . GERD (gastroesophageal reflux disease)   . Insomnia   . Menopausal symptoms   . Osteoporosis   . Postmenopausal HRT (hormone replacement therapy)   . RA (rheumatoid arthritis) ([place]) 11/2004   methotrexate and Embrel  . Sleep apnea 2008   C-Pap  . Vitamin D deficiency    Past Surgical History:  Procedure Laterality Date  . ABDOMINAL HYSTERECTOMY  01/1993   partial with AP repair, secondary to fibroids  . BREAST SURGERY  1991   breast biopsy  . CYSTOCELE REPAIR  10/2007   with graft  . MELANOMA EXCISION Right 01/2012   excision right posterior arm  . NASAL SINUS SURGERY  07/1991  . VAGINAL DELIVERY     x3   Family History  Problem Relation Age of Onset  . Breast cancer Sister 27  . Thyroid disease Sister    Social History  Substance Use Topics  . Smoking status: Never Smoker  . Smokeless tobacco: Never Used  . Alcohol use No      Review of Systems Per HPI    Objective:   Physical Exam  Constitutional: She is oriented to person, place, and time. She appears well-developed and well-nourished. No distress.  HENT:  Head: Normocephalic and atraumatic.  Eyes: Conjunctivae are normal.  Moderate erythema and mild edema left upper eyelid and across and above brow. No drainage, no induration. Few  areas erythematous macules at left side of hairline and on left anterior scalp.   Cardiovascular: Normal rate.   Pulmonary/Chest: Effort normal.  Musculoskeletal: Normal range of motion.  Neurological: She is alert and oriented to person, place, and time.  Skin: Skin is warm and dry. She is not diaphoretic.  Psychiatric: She has a normal mood and affect. Her behavior is normal. Judgment and thought content normal.  Vitals reviewed.     BP 108/78   Pulse 63   Temp 97.5 F (36.4 C)   Wt 168 lb (76.2 kg)   LMP 01/06/1993   SpO2 95%   BMI 27.12 kg/m  Wt Readings from Last 3 Encounters:  08/31/15 168 lb (76.2 kg)  08/24/15 168 lb 8 oz (76.4 kg)  03/28/15 177 lb 12 oz (80.6 kg)       Assessment & Plan:  Discussed with Dr. Diona Browner who also examined patient 1. Shingles outbreak - suspect shingles given predromal pain and sensitivity to area - valACYclovir (VALTREX) 1000 MG tablet; Take 1 tablet (1,000 mg total) by mouth 3 (three) times daily.  Dispense: 21 tablet; Refill: 0 - Ambulatory referral to Ophthalmology- urgent for 3-4 days- patient instructed to go to ER if worsening rash around eye or any visual changes over the weekend- she verbalized understanding of importance.  - Provided written and verbal  information regarding diagnosis and treatment. - RTC precautions reviewed   Clarene Reamer, FNP-BC  [facility] at [facility]  08/31/2015 11:06 AM

## 2015-08-31 NOTE — Patient Instructions (Signed)

## 2015-09-05 ENCOUNTER — Encounter: Payer: Self-pay | Admitting: Family Medicine

## 2015-09-05 ENCOUNTER — Other Ambulatory Visit: Payer: Self-pay | Admitting: Family Medicine

## 2015-09-05 ENCOUNTER — Telehealth: Payer: Self-pay

## 2015-09-05 DIAGNOSIS — B029 Zoster without complications: Secondary | ICD-10-CM

## 2015-09-05 MED ORDER — VALACYCLOVIR HCL 1 G PO TABS: 1000.0000 mg | ORAL_TABLET | Freq: Three times a day (TID) | ORAL | 0 refills | Status: DC

## 2015-09-05 NOTE — Telephone Encounter (Signed)
Pt left v/m requesting cb about refill; I called pt and she said she was responding to Ms Carlean Purl via email now and nothing further needed from me.

## 2015-09-24 DIAGNOSIS — G4733 Obstructive sleep apnea (adult) (pediatric): Secondary | ICD-10-CM | POA: Diagnosis not present

## 2015-09-25 DIAGNOSIS — L821 Other seborrheic keratosis: Secondary | ICD-10-CM | POA: Diagnosis not present

## 2015-09-25 DIAGNOSIS — D2271 Melanocytic nevi of right lower limb, including hip: Secondary | ICD-10-CM | POA: Diagnosis not present

## 2015-09-25 DIAGNOSIS — D1801 Hemangioma of skin and subcutaneous tissue: Secondary | ICD-10-CM | POA: Diagnosis not present

## 2015-09-25 DIAGNOSIS — Z85820 Personal history of malignant melanoma of skin: Secondary | ICD-10-CM | POA: Diagnosis not present

## 2015-09-25 DIAGNOSIS — L812 Freckles: Secondary | ICD-10-CM | POA: Diagnosis not present

## 2015-09-25 DIAGNOSIS — D225 Melanocytic nevi of trunk: Secondary | ICD-10-CM | POA: Diagnosis not present

## 2015-09-25 DIAGNOSIS — Z85828 Personal history of other malignant neoplasm of skin: Secondary | ICD-10-CM | POA: Diagnosis not present

## 2015-10-03 DIAGNOSIS — B0230 Zoster ocular disease, unspecified: Secondary | ICD-10-CM | POA: Diagnosis not present

## 2015-10-22 ENCOUNTER — Ambulatory Visit: Payer: Medicare Other | Admitting: Nurse Practitioner

## 2015-10-22 DIAGNOSIS — Z23 Encounter for immunization: Secondary | ICD-10-CM | POA: Diagnosis not present

## 2015-10-23 ENCOUNTER — Ambulatory Visit (INDEPENDENT_AMBULATORY_CARE_PROVIDER_SITE_OTHER): Payer: Medicare Other | Admitting: Family Medicine

## 2015-10-23 ENCOUNTER — Encounter: Payer: Self-pay | Admitting: Family Medicine

## 2015-10-23 VITALS — BP 132/78 | HR 65 | Temp 98.0°F | Ht 66.0 in | Wt 164.5 lb

## 2015-10-23 DIAGNOSIS — L719 Rosacea, unspecified: Secondary | ICD-10-CM

## 2015-10-23 DIAGNOSIS — K5909 Other constipation: Secondary | ICD-10-CM | POA: Diagnosis not present

## 2015-10-23 MED ORDER — LINACLOTIDE 72 MCG PO CAPS: 72.0000 ug | ORAL_CAPSULE | Freq: Every day | ORAL | 5 refills | Status: DC

## 2015-10-23 MED ORDER — METRONIDAZOLE 0.75 % EX GEL: 1.0000 "application " | Freq: Two times a day (BID) | CUTANEOUS | 5 refills | Status: DC

## 2015-10-23 NOTE — Progress Notes (Signed)
Subjective:    Patient ID: Deanna Norris, female    DOB: 28-Feb-1945, 70 y.o.   MRN: [id number]  HPI Here for constipation  Has a lifelong hx of constipation  Getting worse with time   Usually BM is 2-3 times per week  Gets bloated Passes a lot of gas A lot of gurgling  No n/v   1.5 weeks w/o BM at this time Eats prunes  Fiber - chewy fiber supplement  Colace  miralax    She eats lots of fruits and vegetables She is on wt watchers program    She drinks water with 2 meals per day  One soda  Decaf tea 3-4 glasses  One decaf coffee in the am    She will have watery stools and leaking at times  She was going to give herself an enema this am   She has sjogren's syndrome   Wt Readings from Last 3 Encounters:  10/23/15 164 lb 8 oz (74.6 kg)  08/31/15 168 lb (76.2 kg)  08/24/15 168 lb 8 oz (76.4 kg)    She takes aleve and ibuprofen as needed   She had last colonoscopy 2015 (along with an egd)  Hx of hemorrhoids-occ bleed and some diverticulosis   No narcotic pain meds   Hx of abd hysterectomy in the past with AP repair and also cystocele repair   Patient Active Problem List   Diagnosis Date Noted  . Chronic constipation 10/23/2015  . Rosacea 10/23/2015  . Rheumatoid arthritis ([place]) 03/29/2015  . Abnormal TSH 03/13/2014  . Coronary artery calcification seen on CT scan 11/17/2013  . Recurrent sinusitis 09/16/2013  . Encounter for Medicare annual wellness exam 01/31/2013  . Dyspnea 09/14/2012  . Nodule of right lung 09/14/2012  . 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 Hyperlipidemia 05/08/2009  . 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 ASTHMA 05/08/2009  . GERD 05/08/2009  . MENOPAUSAL DISORDER 05/08/2009  . ARTHRITIS, RHEUMATOID 05/08/2009  . Osteoporosis 05/08/2009  . SPONDYLOLISTHESIS 05/08/2009  . Sleep apnea 05/08/2009   Past Medical History:  Diagnosis Date  . Asthma   . Fibroid   . GERD (gastroesophageal reflux disease)   . Insomnia   . Menopausal  symptoms   . Osteoporosis   . Postmenopausal HRT (hormone replacement therapy)   . RA (rheumatoid arthritis) ([place]) 11/2004   methotrexate and Embrel  . Sleep apnea 2008   C-Pap  . Vitamin D deficiency    Past Surgical History:  Procedure Laterality Date  . ABDOMINAL HYSTERECTOMY  01/1993   partial with AP repair, secondary to fibroids  . BREAST SURGERY  1991   breast biopsy  . CYSTOCELE REPAIR  10/2007   with graft  . MELANOMA EXCISION Right 01/2012   excision right posterior arm  . NASAL SINUS SURGERY  07/1991  . VAGINAL DELIVERY     x3   Social History  Substance Use Topics  . Smoking status: Never Smoker  . Smokeless tobacco: Never Used  . Alcohol use No   Family History  Problem Relation Age of Onset  . Breast cancer Sister 43  . Thyroid disease Sister    Allergies  Allergen Reactions  . Adalimumab     REACTION: frequent sinus infection  . Diclofenac     unk  . Dicyclomine     Turn teeth gray  . Doxycycline Hyclate Other (See Comments)    "turns teeth gray"  . Fosamax [Alendronate Sodium]     Acid reflux  . Methotrexate  Derivatives Nausea Only    Oral metotrexate  . Nickel   . Piroxicam     unk  . Salagen [Pilocarpine Hcl] Other (See Comments)    Excessive sweating, headaches  . Sulfamethoxazole-Trimethoprim     REACTION: face and hands rash and felt burning sensation  . Tramadol Hcl     REACTION: nausea  . Plaquenil [Hydroxychloroquine] Rash   Current Outpatient Prescriptions on File Prior to Visit  Medication Sig Dispense Refill  . Abatacept (ORENCIA CLICKJECT [place]) Inject into the skin every 7 (seven) days.    Marland Kitchen albuterol (PROAIR HFA) 108 (90 BASE) MCG/ACT inhaler Inhale 2 puffs into the lungs every 4 (four) hours as needed. 1 Inhaler 3  . aspirin 81 MG tablet Take 81 mg by mouth daily.     . Cholecalciferol (VITAMIN D3) 2000 UNITS TABS Take 1 tablet by mouth daily.      . diclofenac sodium (VOLTAREN) 1 % GEL Apply 1 application topically at bedtime.     . Estradiol 10 MCG TABS vaginal tablet Place 1 tablet (10 mcg total) vaginally 2 (two) times a week. 24 tablet 3  . fluticasone (FLONASE) 50 MCG/ACT nasal spray USE 2 SPRAYS IN EACH NOSTRIL DAILY AS NEEDED FOR RHINITIS OR ALLERGIES 48 g 3  . Folic Acid-Vit Q000111Q 123456 (FOLGARD RX) 2.2-25-1 MG TABS Take 1 tablet by mouth daily.      . hydrocortisone 2.5 % ointment Apply 1 application topically as needed.     . methotrexate 25 MG/ML SOLN Inject 1 ml under the skin every week     . naproxen sodium (ANAPROX) 220 MG tablet Take 220 mg by mouth daily as needed.    . ranitidine (ZANTAC) 300 MG tablet Take 300 mg by mouth 2 (two) times daily.     Marland Kitchen zolpidem (AMBIEN) 5 MG tablet Take 2.5-5 mg by mouth at bedtime as needed for sleep.      No current facility-administered medications on file prior to visit.     Review of Systems Review of Systems  Constitutional: Negative for fever, appetite change, fatigue and unexpected weight change.  Eyes: Negative for pain and visual disturbance.  Respiratory: Negative for cough and shortness of breath.   Cardiovascular: Negative for cp or palpitations    Gastrointestinal: Negative for nausea, diarrhea and pos for constipation. pos for hemorrhoids , neg for abd pain  Genitourinary: Negative for urgency and frequency.  Skin: Negative for pallor or rash   Neurological: Negative for weakness, light-headedness, numbness and headaches.  Hematological: Negative for adenopathy. Does not bruise/bleed easily.  Psychiatric/Behavioral: Negative for dysphoric mood. The patient is not nervous/anxious.         Objective:   Physical Exam  Constitutional: She appears well-developed and well-nourished. No distress.  Well appearing   HENT:  Head: Normocephalic and atraumatic.  Mouth/Throat: Oropharynx is clear and moist.  Eyes: Conjunctivae and EOM are normal. Pupils are equal, round, and reactive to light. No scleral icterus.  Neck: Normal range of motion. Neck supple.   Cardiovascular: Normal rate, regular rhythm and normal heart sounds.   Pulmonary/Chest: Effort normal and breath sounds normal. No respiratory distress. She has no wheezes. She has no rales.  Abdominal: Soft. Bowel sounds are normal. She exhibits no distension, no abdominal bruit, no pulsatile midline mass and no mass. There is no hepatosplenomegaly. There is no tenderness. There is no rebound, no guarding and no CVA tenderness.  Lymphadenopathy:    She has no cervical adenopathy.  Neurological: She  is alert.  Skin: Skin is warm and dry. No erythema. No pallor.  Psychiatric: She has a normal mood and affect.          Assessment & Plan:   Problem List Items Addressed This Visit      Digestive   Chronic constipation    Lifelong problem-worse lately  Disc need for balance diet/exercise and more fluids  inst to stop fiber chews (? If making problem worse instead of better) Disc use of miralax  Px linzess - 72 mg to try daily prn-disc poss side eff like diarrhea Update if no improvement         Musculoskeletal and Integument   Rosacea    Refilled metro gel for face        Other Visit Diagnoses   None.

## 2015-10-23 NOTE — Progress Notes (Signed)
Pre visit review using our clinic review tool, if applicable. No additional management support is needed unless otherwise documented below in the visit note. 

## 2015-10-23 NOTE — Patient Instructions (Signed)
Drink more water - 3-4 more servings per day  Stop the fiber chews Continue miralax 1 capful daily if needed  Try the linzess- daily as needed (do not take it more than once per day)  Update me if worse or not improving

## 2015-10-24 ENCOUNTER — Ambulatory Visit (INDEPENDENT_AMBULATORY_CARE_PROVIDER_SITE_OTHER): Payer: Medicare Other | Admitting: Rheumatology

## 2015-10-24 DIAGNOSIS — M79641 Pain in right hand: Secondary | ICD-10-CM | POA: Diagnosis not present

## 2015-10-24 DIAGNOSIS — M0579 Rheumatoid arthritis with rheumatoid factor of multiple sites without organ or systems involvement: Secondary | ICD-10-CM

## 2015-10-24 DIAGNOSIS — Z79899 Other long term (current) drug therapy: Secondary | ICD-10-CM

## 2015-10-24 DIAGNOSIS — M79671 Pain in right foot: Secondary | ICD-10-CM

## 2015-10-24 DIAGNOSIS — M5137 Other intervertebral disc degeneration, lumbosacral region: Secondary | ICD-10-CM | POA: Diagnosis not present

## 2015-10-24 NOTE — Assessment & Plan Note (Signed)
Lifelong problem-worse lately  Disc need for balance diet/exercise and more fluids  inst to stop fiber chews (? If making problem worse instead of better) Disc use of miralax  Px linzess - 72 mg to try daily prn-disc poss side eff like diarrhea Update if no improvement

## 2015-10-24 NOTE — Assessment & Plan Note (Signed)
Refilled metrogel for face 

## 2015-10-26 ENCOUNTER — Ambulatory Visit: Payer: Medicare Other | Admitting: Nurse Practitioner

## 2015-10-31 ENCOUNTER — Encounter: Payer: Self-pay | Admitting: Nurse Practitioner

## 2015-10-31 ENCOUNTER — Ambulatory Visit (INDEPENDENT_AMBULATORY_CARE_PROVIDER_SITE_OTHER): Payer: Medicare Other | Admitting: Nurse Practitioner

## 2015-10-31 VITALS — BP 110/66 | HR 60 | Ht 65.75 in | Wt 165.0 lb

## 2015-10-31 DIAGNOSIS — Z1159 Encounter for screening for other viral diseases: Secondary | ICD-10-CM | POA: Diagnosis not present

## 2015-10-31 DIAGNOSIS — Z01419 Encounter for gynecological examination (general) (routine) without abnormal findings: Secondary | ICD-10-CM

## 2015-10-31 DIAGNOSIS — M3500 Sicca syndrome, unspecified: Secondary | ICD-10-CM | POA: Diagnosis not present

## 2015-10-31 DIAGNOSIS — Z0000 Encounter for general adult medical examination without abnormal findings: Secondary | ICD-10-CM

## 2015-10-31 MED ORDER — ESTRADIOL 10 MCG VA TABS: 1.0000 | ORAL_TABLET | VAGINAL | 4 refills | Status: DC

## 2015-10-31 NOTE — Progress Notes (Signed)
Patient ID: Deanna Norris, female   DOB: Mar 07, 1945, 70 y.o.   MRN: [id number]  70 y.o. [id number] Married  Caucasian Fe here for annual exam.  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  Patient's last menstrual period was 01/06/1993.          Sexually active: Yes.    The current method of family planning is status post hysterectomy.    Exercising: No.  The patient does not participate in regular exercise at present. Smoker:  no  Health Maintenance: Pap:10/18/14, Negative MMG: 12/20/14, Bi-Rads 1: Negative  Colonoscopy: 01/12/13, Normal repeat in 10 years, Dr. Collene Mares BMD: 12/09/13, T-Score -1.4 Spine / -2.6 Right Femur Neck / -2.1 Left Femur Neck TDaP:03/23/13 Shingles: 05/08/14 Pneumovax: 03/28/15 Pneumovax, 02/13/14 Prevnar-13, 06/12/10 Pneumovax Hep C: done today Labs: PCP in EPIC   reports that she has never smoked. She has never used smokeless tobacco. She reports that she does not drink alcohol or use drugs.  Past Medical History:  Diagnosis Date  . Asthma   . Fibroid   . GERD (gastroesophageal reflux disease)   . Insomnia   . Menopausal symptoms   . Osteoporosis   . Postmenopausal HRT (hormone replacement therapy)   . RA (rheumatoid arthritis) ([place]) 11/2004   methotrexate and Embrel  . Sleep apnea 2008   C-Pap  . Vitamin D deficiency     Past Surgical History:  Procedure Laterality Date  . ABDOMINAL HYSTERECTOMY  01/1993   partial with AP repair, secondary to fibroids  . BREAST SURGERY  1991   breast biopsy  . CYSTOCELE REPAIR  10/2007   with graft  . MELANOMA EXCISION Right 01/2012   excision right posterior arm  . NASAL SINUS SURGERY  07/1991  . VAGINAL DELIVERY     x3    Current Outpatient Prescriptions  Medication Sig Dispense Refill  . Abatacept (ORENCIA CLICKJECT [place]) Inject into the skin every 7 (seven) days.    Marland Kitchen albuterol (PROAIR HFA) 108 (90 BASE) MCG/ACT inhaler Inhale 2 puffs into the lungs every 4 (four) hours as needed. 1 Inhaler 3   . aspirin 81 MG tablet Take 81 mg by mouth daily.     . Cholecalciferol (VITAMIN D3) 2000 UNITS TABS Take 1 tablet by mouth daily.      . diclofenac sodium (VOLTAREN) 1 % GEL Apply 1 application topically at bedtime.    . Estradiol 10 MCG TABS vaginal tablet Place 1 tablet (10 mcg total) vaginally 2 (two) times a week. 24 tablet 3  . fluticasone (FLONASE) 50 MCG/ACT nasal spray USE 2 SPRAYS IN EACH NOSTRIL DAILY AS NEEDED FOR RHINITIS OR ALLERGIES 48 g 3  . Folic Acid-Vit Q000111Q 123456 (FOLGARD RX) 2.2-25-1 MG TABS Take 1 tablet by mouth daily.      . hydrocortisone 2.5 % ointment Apply 1 application topically as needed.     . linaclotide (LINZESS) 72 MCG capsule Take 1 capsule (72 mcg total) by mouth daily before breakfast. As needed for constipation 30 capsule 5  . methotrexate 25 MG/ML SOLN Inject 1 ml under the skin every week     . metroNIDAZOLE (METROGEL) 0.75 % gel Apply 1 application topically 2 (two) times daily. 45 g 5  . Multiple Vitamins-Minerals (SENIOR MULTIVITAMIN PLUS PO) Take 1 capsule by mouth daily.    . naproxen sodium (ANAPROX) 220 MG tablet Take 220 mg by mouth daily as needed.    . ranitidine (ZANTAC) 300 MG tablet Take 300 mg  by mouth 2 (two) times daily.     Marland Kitchen zolpidem (AMBIEN) 5 MG tablet Take 2.5-5 mg by mouth at bedtime as needed for sleep.      No current facility-administered medications for this visit.     Family History  Problem Relation Age of Onset  . Breast cancer Sister 34  . Thyroid disease Sister     ROS:  Pertinent items are noted in HPI.  Otherwise, a comprehensive ROS was negative.  Exam:   BP 110/66 (BP Location: Right Arm, Patient Position: Sitting, Cuff Size: Normal)   Pulse 60   Ht 5' 5.75" (1.67 m)   Wt 165 lb (74.8 kg)   LMP 01/06/1993   BMI 26.83 kg/m  Height: 5' 5.75" (167 cm) Ht Readings from Last 3 Encounters:  10/31/15 5' 5.75" (1.67 m)  10/23/15 5\' 6"  (1.676 m)  03/28/15 5\' 6"  (1.676 m)    General appearance: alert,  cooperative and appears stated age Head: Normocephalic, without obvious abnormality, atraumatic Neck: no adenopathy, supple, symmetrical, trachea midline and thyroid normal to inspection and palpation Lungs: clear to auscultation bilaterally Breasts: normal appearance, no masses or tenderness Heart: regular rate and rhythm Abdomen: soft, non-tender; no masses,  no organomegaly Extremities: extremities normal, atraumatic, no cyanosis or edema Skin: Skin color, texture, turgor normal. No rashes or lesions Lymph nodes: Cervical, supraclavicular, and axillary nodes normal. No abnormal inguinal nodes palpated Neurologic: Grossly normal   Pelvic: External genitalia:  no lesions              Urethra:  normal appearing urethra with no masses, tenderness or lesions              Bartholin's and Skene's: normal                 Vagina: normal appearing vagina with normal color and discharge, no lesions              Cervix: absent              Pap taken: No. Bimanual Exam:  Uterus:  uterus absent              Adnexa: no mass, fullness, tenderness               Rectovaginal: Confirms               Anus:  normal sphincter tone, no lesions  Chaperone present: yes  A:  Well Woman with normal exam       S/P TAH, APR 01/1993 secondary to fibroids History of RA on methotrexate History of Vit D deficiency Osteoporosis - Actonel 2004- 2006 & 2008- 2010   P:   Reviewed health and wellness pertinent to exam  Pap smear as above  Mammogram is due 12/17  Refill on Vagifem - now generic for a year  Counseled with risk of DVT, CVA, cancer, etc.  Will follow with lab  Counseled on breast self exam, mammography screening, adequate intake of calcium and vitamin D, diet and exercise, Kegel's exercises return annually or prn  An After Visit Summary was printed and given to the patient.

## 2015-10-31 NOTE — Patient Instructions (Signed)

## 2015-11-01 LAB — HEPATITIS C ANTIBODY: HCV Ab: NEGATIVE

## 2015-11-02 NOTE — Progress Notes (Signed)
Encounter reviewed by Dr. Amori Colomb Amundson C. Silva.  

## 2015-11-12 ENCOUNTER — Telehealth: Payer: Self-pay | Admitting: Rheumatology

## 2015-11-12 MED ORDER — ORENCIA 125 MG/ML ~~LOC~~ SOSY: PREFILLED_SYRINGE | SUBCUTANEOUS | 0 refills | Status: DC

## 2015-11-12 MED ORDER — METHOTREXATE SODIUM CHEMO INJECTION (PF) 50 MG/2ML: INTRAMUSCULAR | 0 refills | Status: DC

## 2015-11-12 NOTE — Telephone Encounter (Signed)
Last visit 10/24/15 Next visit 02/07/16 Labs 10/24/15 04/24/15 TB negative Ok to refill per Dr Estanislado Pandy

## 2015-11-12 NOTE — Telephone Encounter (Signed)
Patient needs a refill of Orencia from [facility] and injectable MTX from [facility] in [place].

## 2015-11-15 ENCOUNTER — Encounter: Payer: Self-pay | Admitting: Rheumatology

## 2015-11-15 DIAGNOSIS — Z79899 Other long term (current) drug therapy: Secondary | ICD-10-CM | POA: Insufficient documentation

## 2016-01-11 ENCOUNTER — Other Ambulatory Visit: Payer: Self-pay | Admitting: Nurse Practitioner

## 2016-01-11 DIAGNOSIS — M19042 Primary osteoarthritis, left hand: Secondary | ICD-10-CM

## 2016-01-11 DIAGNOSIS — M170 Bilateral primary osteoarthritis of knee: Secondary | ICD-10-CM | POA: Insufficient documentation

## 2016-01-11 DIAGNOSIS — M19041 Primary osteoarthritis, right hand: Secondary | ICD-10-CM | POA: Insufficient documentation

## 2016-01-11 DIAGNOSIS — M47816 Spondylosis without myelopathy or radiculopathy, lumbar region: Secondary | ICD-10-CM | POA: Insufficient documentation

## 2016-01-11 NOTE — Telephone Encounter (Signed)
Medication refill request: Estradiol 70mcg Last AEX:  10/31/15 PG Next AEX: 11/04/16 Last MMG (if hormonal medication request): 12/20/14 BIRADS 1 negative Refill authorized: 10/31/15 #24 w/4 refills

## 2016-01-11 NOTE — Progress Notes (Signed)
Office Visit Note  Patient: Deanna Norris             Date of Birth: 1945-09-26           MRN: [id number]             PCP: Deanna Pardon, MD Referring: Norris, Deanna Fanny, MD Visit Date: 01/14/2016 Occupation: @GUAROCC @    Subjective:  Pain of the Right Foot; Pain of the Left Foot; Pain of the Right Knee (Wants to repeat Euflexxa injections bilateral knees); Pain of the Left Knee; and Follow-up follow-up on rheumatoid arthritis, high risk prescription  History of Present Illness: Deanna Norris is a 71 y.o. female  Last seen 10/24/2015 History of severe erosive disease of her rheumatoid arthritis. On last visit in October, she did not have any synovitis.  She's currently taking Orencia subcutaneous, methotrexate 0.9 mL's, folgard (which has 2.2mg  folic acid)  She has a history of osteoarthritis of the feet and knee joint and has overall some discomfort. Euflexxa for her knees have done quite well.  Patient's last labs were from 10/24/2015. CMP with GFR worse normal CBC with differential is normal limits. She'll need repeat labs today in office.  Has history of OA of the knee joint. We gave her Euflex injections to both knees and finished treatment 06/22/2015. Has done significantly well with those injections. She is due for repeat injection today since it has not been prior authorize, were unable to give. In addition she is traveling [place]. By the time it is approved she'll be already overseas. Therefore I've asked the patient to come in for cortisone injection first right before her travels and then we can apply for Euflexxa, then we can start her injections. Patient is agreeable with this.  She has some osteoarthritis discomfort and she wants to know if she can take turmeric for this. I advised her that she can but we discussed omega-3,  tart Cherry, Ginger, turmeric in depth for management of OA.Marland Kitchen   Activities of Daily Living:  Patient reports morning stiffness for 15  minutes.   Patient Denies nocturnal pain.  Difficulty dressing/grooming: Denies Difficulty climbing stairs: Reports Difficulty getting out of chair: Reports Difficulty using hands for taps, buttons, cutlery, and/or writing: Denies   Review of Systems  Constitutional: Negative for fatigue.  HENT: Negative for mouth sores and mouth dryness.   Eyes: Negative for dryness.  Respiratory: Negative for shortness of breath.   Gastrointestinal: Negative for constipation and diarrhea.  Musculoskeletal: Negative for myalgias and myalgias.  Skin: Negative for sensitivity to sunlight.  Psychiatric/Behavioral: Negative for decreased concentration and sleep disturbance.    PMFS History:  Patient Active Problem List   Diagnosis Date Noted  . 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 Primary osteoarthritis of both hands 01/11/2016  . 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/11/2016  . High risk medication use 11/15/2015  . Sjogren's syndrome (Deanna Norris) 10/31/2015  . Chronic constipation 10/23/2015  . Rosacea 10/23/2015  . Rheumatoid arthritis (Deanna Norris) 03/29/2015  . Abnormal TSH 03/13/2014  . Coronary artery calcification seen on CT scan 11/17/2013  . Recurrent sinusitis 09/16/2013  . Encounter for Medicare annual wellness exam 01/31/2013  . Dyspnea 09/14/2012  . Nodule of right lung 09/14/2012  . 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 Hyperlipidemia 05/08/2009  . 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 ASTHMA 05/08/2009  . GERD 05/08/2009  . MENOPAUSAL DISORDER 05/08/2009  . ARTHRITIS, RHEUMATOID 05/08/2009  . Osteoporosis 05/08/2009  . SPONDYLOLISTHESIS  05/08/2009  . Sleep apnea 05/08/2009    Past Medical History:  Diagnosis Date  . Asthma   . Fibroid   . GERD (gastroesophageal reflux disease)   . Insomnia   . Menopausal symptoms   . Osteoporosis   . Postmenopausal HRT (hormone replacement therapy)   . RA (rheumatoid arthritis) ([place]) 11/2004   methotrexate and Embrel    . Sleep apnea 2008   C-Pap  . Vitamin D deficiency     Family History  Problem Relation Age of Onset  . Breast cancer Sister 66  . Thyroid disease Sister    Past Surgical History:  Procedure Laterality Date  . ABDOMINAL HYSTERECTOMY  01/1993   partial with AP repair, secondary to fibroids  . BREAST SURGERY  1991   breast biopsy  . CYSTOCELE REPAIR  10/2007   with graft  . MELANOMA EXCISION Right 01/2012   excision right posterior arm  . NASAL SINUS SURGERY  07/1991  . VAGINAL DELIVERY     x3   Social History   Social History Narrative  . No narrative on file     Objective: Vital Signs: BP 118/68   Pulse 68   Resp 16   Ht 5\' 7"  (1.702 m)   Wt 164 lb (74.4 kg)   LMP 01/06/1993   BMI 25.69 kg/m    Physical Exam  Constitutional: She is oriented to person, place, and time. She appears well-developed and well-nourished.  HENT:  Head: Normocephalic and atraumatic.  Eyes: EOM are normal. Pupils are equal, round, and reactive to light.  Cardiovascular: Normal rate, regular rhythm and normal heart sounds.  Exam reveals no gallop and no friction rub.   No murmur heard. Pulmonary/Chest: Effort normal and breath sounds normal. She has no wheezes. She has no rales.  Abdominal: Soft. Bowel sounds are normal. She exhibits no distension. There is no tenderness. There is no guarding. No hernia.  Musculoskeletal: Normal range of motion. She exhibits no edema, tenderness or deformity.  Lymphadenopathy:    She has no cervical adenopathy.  Neurological: She is alert and oriented to person, place, and time. Coordination normal.  Skin: Skin is warm and dry. Capillary refill takes less than 2 seconds. No rash noted.  Psychiatric: She has a normal mood and affect. Her behavior is normal.  Nursing note and vitals reviewed.    Musculoskeletal Exam:  Full range of motion of all joints Grip strength is equal and strong bilaterally Fiber myalgia tender points are all absent  CDAI  Exam: CDAI Homunculus Exam:   Joint Counts:  CDAI Tender Joint count: 0 CDAI Swollen Joint count: 0  Global Assessments:  Patient Global Assessment: 2 Provider Global Assessment: 2  CDAI Calculated Score: 4  No synovitis on examinations. Note that she does have synovial thickening to bilateral second and third MCP joint but no active flares/synovitis. Good response from Orencia subcutaneous, methotrexate 0.9 mL's, folgard Rx, which has 2.2 mg of folic acid.  Investigation: Findings:  04/25/15 negative TB gold  10/24/2015 normal CBC and CMP  Patient is due for labs today. CBC with differential CMP with GFR today CBC with differential CMP with GFR in 3 months including TB gold order placed already   Imaging: No results found.  Speciality Comments: No specialty comments available.    Procedures:  No procedures performed Allergies: Adalimumab; Diclofenac; Dicyclomine; Doxycycline hyclate; Fosamax [alendronate sodium]; Methotrexate derivatives; Nickel; Piroxicam; Salagen [pilocarpine hcl]; Sulfamethoxazole-trimethoprim; Tramadol hcl; and Plaquenil [hydroxychloroquine]   Assessment / Plan:  Visit Diagnoses: Rheumatoid arthritis involving multiple sites with positive rheumatoid factor (HCC)  Spondylosis of lumbar region without myelopathy or radiculopathy  Primary osteoarthritis of both hands  Primary osteoarthritis of both knees  Age-related osteoporosis without current pathological fracture  Sjogren's syndrome with keratoconjunctivitis sicca (HCC)  History of gastroesophageal reflux (GERD)  History of sleep apnea  High risk medication use - Methotrexate/ patient had frequent infections on Humira  - Plan: CBC with Differential/Platelet, COMPLETE METABOLIC PANEL WITH GFR, Quantiferon tb gold assay (blood)   Plan: #1: Rheumatoid arthritis. Severe erosive disease. No synovitis on examination today. She does have synovial thickening to bilateral second and third MCP  joint  #2: High risk prescription: Methotrexate 0.9 ML's every week; Orencia subcutaneous weekly; Folgard Rx every day  #3: Patient had OA of the knee joint treated with Euflexxa 3 bilateral knees and finish series 06/22/2015. Patient is doing really well but planning on trip overseas and starting to have early knee pain to both knees. We are unable to procure Euflexxa at this time so we will give the patient cortisone injection about a week before her overseas trip. On that visit I plan to apply for Euflexxa and hopefully start the injections once approved after the patient returns from her trip. Patient is agreeable with this plan  #4: Discussed in detail treatment of her osteoarthritis with over-the-counter medications that include omega-3 fatty acid, tart Cherry pills, Ginger, turmeric. Patient knows to only use the pills that are helping her. Patient also knows that she start with 1 of these medications and titrate upwards.  #5: CBC with differential, CMP with GFR today  #6: CBC with differential CMP with GFR TB gold April 2018  #7: Patient was advised that she can apply a little bit of Voltaren gel to the dorsal aspects of both feet when she is overseas for comfort since her feet do hurt. We also discussed that her 71 year old orthotics may not be helping her anymore and she may benefit from Deanna inserts. She can obtain fairly good inserts from [employer] on [employer]. Patient is agreeable.  #8: Refill all of her meds  #9: Dr. Koleen Nimrod wrote a letter for the patient allowing her to travel with syringe and needle to inject methotrexate. I sign the letter and advised patient to travel with this so there is no problem at the airport.  #10: History of OA of the knee joint. Received excellent benefit from Euflexxa bilateral knees 3 finished 06/22/2015. Patient wishes to repeat her series but because she is traveling overseas we may not be able to procure the medication by then. I have  offered her cortisone injection to both knees about a week before she leaves. She will schedule an appointment for these injections only. At that time I'll apply for Euflexxa for both knees 3.   Orders: Orders Placed This Encounter  Procedures  . CBC with Differential/Platelet  . COMPLETE METABOLIC PANEL WITH GFR  . Quantiferon tb gold assay (blood)   Meds ordered this encounter  Medications  . ORENCIA 125 MG/ML SOSY    Sig: Inject one device sub q weekly    Dispense:  12 Syringe    Refill:  0  . methotrexate 50 MG/2ML injection    Sig: Inject 0.9 mLs (22.5 mg total) into the skin once a week.    Dispense:  12 mL    Refill:  0  . zolpidem (AMBIEN) 10 MG tablet    Sig: Take 0.5 tablets (  5 mg total) by mouth at bedtime as needed for sleep.    Dispense:  90 tablet    Refill:  0    Face-to-face time spent with patient was 40 minutes. 50% of time was spent in counseling and coordination of care.  Follow-Up Instructions: Return in about 5 months (around 06/13/2016) for RA, orencia sq, folgard 2.2mg , ok kj, l>k knee pain,,.   Soliyana Mcchristian, PA-C  Note - This record has been created using [employer].  Chart creation errors have been sought, but may not always  have been located. Such creation errors do not reflect on  the standard of medical care.

## 2016-01-14 ENCOUNTER — Ambulatory Visit (INDEPENDENT_AMBULATORY_CARE_PROVIDER_SITE_OTHER): Payer: Medicare Other | Admitting: Rheumatology

## 2016-01-14 ENCOUNTER — Encounter: Payer: Self-pay | Admitting: Rheumatology

## 2016-01-14 VITALS — BP 118/68 | HR 68 | Resp 16 | Ht 67.0 in | Wt 164.0 lb

## 2016-01-14 DIAGNOSIS — M0579 Rheumatoid arthritis with rheumatoid factor of multiple sites without organ or systems involvement: Secondary | ICD-10-CM

## 2016-01-14 DIAGNOSIS — M19041 Primary osteoarthritis, right hand: Secondary | ICD-10-CM

## 2016-01-14 DIAGNOSIS — Z8669 Personal history of other diseases of the nervous system and sense organs: Secondary | ICD-10-CM

## 2016-01-14 DIAGNOSIS — M3501 Sicca syndrome with keratoconjunctivitis: Secondary | ICD-10-CM | POA: Diagnosis not present

## 2016-01-14 DIAGNOSIS — M47816 Spondylosis without myelopathy or radiculopathy, lumbar region: Secondary | ICD-10-CM | POA: Diagnosis not present

## 2016-01-14 DIAGNOSIS — M170 Bilateral primary osteoarthritis of knee: Secondary | ICD-10-CM

## 2016-01-14 DIAGNOSIS — Z79899 Other long term (current) drug therapy: Secondary | ICD-10-CM

## 2016-01-14 DIAGNOSIS — M810 Age-related osteoporosis without current pathological fracture: Secondary | ICD-10-CM

## 2016-01-14 DIAGNOSIS — Z8719 Personal history of other diseases of the digestive system: Secondary | ICD-10-CM | POA: Diagnosis not present

## 2016-01-14 DIAGNOSIS — M19042 Primary osteoarthritis, left hand: Secondary | ICD-10-CM

## 2016-01-14 LAB — CBC WITH DIFFERENTIAL/PLATELET
BASOS PCT: 1 %
Basophils Absolute: 77 cells/uL (ref 0–200)
EOS PCT: 5 %
Eosinophils Absolute: 385 cells/uL (ref 15–500)
HEMATOCRIT: 44.7 % (ref 35.0–45.0)
HEMOGLOBIN: 14.9 g/dL (ref 11.7–15.5)
LYMPHS ABS: 2772 {cells}/uL (ref 850–3900)
Lymphocytes Relative: 36 %
MCH: 32.5 pg (ref 27.0–33.0)
MCHC: 33.3 g/dL (ref 32.0–36.0)
MCV: 97.4 fL (ref 80.0–100.0)
MPV: 10.6 fL (ref 7.5–12.5)
Monocytes Absolute: 770 cells/uL (ref 200–950)
Monocytes Relative: 10 %
NEUTROS ABS: 3696 {cells}/uL (ref 1500–7800)
Neutrophils Relative %: 48 %
Platelets: 284 10*3/uL (ref 140–400)
RBC: 4.59 MIL/uL (ref 3.80–5.10)
RDW: 14.6 % (ref 11.0–15.0)
WBC: 7.7 10*3/uL (ref 3.8–10.8)

## 2016-01-14 MED ORDER — ORENCIA 125 MG/ML ~~LOC~~ SOSY: PREFILLED_SYRINGE | SUBCUTANEOUS | 0 refills | Status: DC

## 2016-01-14 MED ORDER — METHOTREXATE SODIUM CHEMO INJECTION 50 MG/2ML: 22.5000 mg | INTRAMUSCULAR | 0 refills | Status: DC

## 2016-01-14 MED ORDER — ZOLPIDEM TARTRATE 10 MG PO TABS: 5.0000 mg | ORAL_TABLET | Freq: Every evening | ORAL | 0 refills | Status: DC | PRN

## 2016-01-14 NOTE — Patient Instructions (Addendum)
Standing Labs We placed an order today for your standing lab work.    Please come back and get your standing labs in April 2018 and every 3 months.  You will also be due for your TB Gold in April 2018.    We have open lab Monday through Friday from 8:30-11:30 AM and 1:30-4 PM at the office of Dr. Tresa Moore, PA.   The office is located at [address], [place], [place], [place] [address] No appointment is necessary.   Labs are drawn by [facility].  You may receive a bill from [facility] for your lab work.

## 2016-01-14 NOTE — Progress Notes (Signed)
Rheumatology Medication Review by a Pharmacist Does the patient feel that his/her medications are working for him/her?  Yes Has the patient been experiencing any side effects to the medications prescribed?  No Does the patient have any problems obtaining medications?  No  Issues to address at subsequent visits: None   Pharmacist comments: Deanna Norris is a pleasant 71 yo F who presents for follow up of her rheumatoid arthritis.  She is currently taking Orencia SQ 125 mg weekly, methotrexate 0.9 mL weekly, and folic acid 2 mg daily.  Her most recent standing labs were on 10/24/15 which showed normal CMP and CBC with Hgb 15.7, Hct 47.7.  She will be due for standing labs this month.  Her most recent TB Gold was on 04/25/15 which was negative.  She will be due for TB Gold again in April 2018.  Patient denies any questions or concerns regarding her medications at this time.    Patient is going to be traveling to [place] next month.  Discussed reviewing required vaccinations prior to travel through the [facility] website.  Reviewed with patient that she should avoid live vaccines while on Orencia.  Patient voiced understanding.      [name], Pharm.D., BCPS Clinical Pharmacist Pager: [phone] Phone: [phone] 01/14/2016 11:19 AM

## 2016-01-15 LAB — COMPLETE METABOLIC PANEL WITH GFR
ALBUMIN: 3.7 g/dL (ref 3.6–5.1)
ALK PHOS: 91 U/L (ref 33–130)
ALT: 23 U/L (ref 6–29)
AST: 24 U/L (ref 10–35)
BUN: 12 mg/dL (ref 7–25)
CO2: 27 mmol/L (ref 20–31)
CREATININE: 0.58 mg/dL — AB (ref 0.60–0.93)
Calcium: 9.2 mg/dL (ref 8.6–10.4)
Chloride: 105 mmol/L (ref 98–110)
GFR, Est African American: >89 mL/min (ref >=60)
GFR, Est Non African American: >89 mL/min (ref >=60)
GLUCOSE: 89 mg/dL (ref 65–99)
POTASSIUM: 4.7 mmol/L (ref 3.5–5.3)
SODIUM: 140 mmol/L (ref 135–146)
TOTAL PROTEIN: 6.5 g/dL (ref 6.1–8.1)
Total Bilirubin: 0.4 mg/dL (ref 0.2–1.2)

## 2016-01-16 ENCOUNTER — Other Ambulatory Visit: Payer: Self-pay | Admitting: *Deleted

## 2016-01-16 DIAGNOSIS — Z853 Personal history of malignant neoplasm of breast: Secondary | ICD-10-CM | POA: Diagnosis not present

## 2016-01-16 DIAGNOSIS — Z1231 Encounter for screening mammogram for malignant neoplasm of breast: Secondary | ICD-10-CM | POA: Diagnosis not present

## 2016-01-16 DIAGNOSIS — M810 Age-related osteoporosis without current pathological fracture: Secondary | ICD-10-CM | POA: Diagnosis not present

## 2016-01-16 MED ORDER — FOLIC ACID-VIT B6-VIT B12 2.2-25-1 MG PO TABS: 1.0000 | ORAL_TABLET | Freq: Every day | ORAL | 4 refills | Status: DC

## 2016-01-16 NOTE — Telephone Encounter (Signed)
I might have filled this already yesterday. Medicine is called [facility]

## 2016-01-16 NOTE — Telephone Encounter (Signed)
Last Visit: 01/14/16 Next Visit: 06/09/16 Labs: 01/14/16 WNL  Okay to refill Virt-Gard?

## 2016-01-17 ENCOUNTER — Encounter: Payer: Self-pay | Admitting: Nurse Practitioner

## 2016-01-25 ENCOUNTER — Telehealth: Payer: Self-pay | Admitting: Nurse Practitioner

## 2016-01-25 NOTE — Telephone Encounter (Signed)
Please let pt know that BMD done on 01/16/16 shows a T score at spine -1.80; right hip neck at -3.40; left hip neck at -2.40.  She falls in the Osteoporosis range.  Comparison to previous exam 12/09/13 there is a loss at the spine of -5%; right hip at -14%; left hip at -4%. She was on Actonel several times in the past.  Now with this degree of loss at the right hip by -14% not sure if we need to do more than restarting Actonel.  She has several other risk factors of [place] of Osteoporosis, her hx of RA, methotrexate history, Orencia and GERD.  Please have pt come in for a consult with Dr. Quincy Simmonds for other recommendations such as Prolia.

## 2016-01-25 NOTE — Telephone Encounter (Signed)
Left message to call Klare Criss at [phone].  

## 2016-01-27 NOTE — Telephone Encounter (Signed)
I am happy to discuss options for treatment with the patient.  [place]

## 2016-01-29 NOTE — Telephone Encounter (Signed)
Left message to call Conchetta Lamia at [phone].  

## 2016-01-31 NOTE — Telephone Encounter (Signed)
Spoke with patient. Advised of BMD results as seen below from Kem Boroughs, [place]. Patient verbalizes understanding. Consult appointment scheduled for 02/04/2016 at 11:30 am with Dr.Silva. Patient is agreeable to date and time. Signed copy of BMD to the back for scan.  Cc: Kem Boroughs, FNP   Routing to provider for final review. Patient agreeable to disposition. Will close encounter.

## 2016-02-04 ENCOUNTER — Ambulatory Visit (INDEPENDENT_AMBULATORY_CARE_PROVIDER_SITE_OTHER): Payer: Medicare Other | Admitting: Obstetrics and Gynecology

## 2016-02-04 ENCOUNTER — Encounter: Payer: Self-pay | Admitting: Obstetrics and Gynecology

## 2016-02-04 VITALS — BP 116/60 | HR 76 | Resp 16 | Ht 66.0 in | Wt 163.0 lb

## 2016-02-04 DIAGNOSIS — M810 Age-related osteoporosis without current pathological fracture: Secondary | ICD-10-CM

## 2016-02-04 NOTE — Progress Notes (Signed)
GYNECOLOGY  VISIT   HPI: 71 y.o.   Married  Caucasian  female   [phone] with Patient's last menstrual period was 01/06/1993.   here for  Bone density consult Last bone density: 01/16/16 Right Femoral Neck - Tscore -3.4- Osteoporosis - Results in EPIC  Use Actonel in the past.  This was last used 10 years ago.  Had maybe increased acid reflux with this.  Has rheumatoid arthritis. On Orencia.  Has used methotrexate also.  Used steroids 10 years ago.   Some cortisone injections several months ago.   Hx GERD. On Ranitidine to control this.   Difficulty exercising due to RA.  Has a lot of joint pain.   Hx of fracture in her lower back diagnosed years ago - L5, S2. Has some problems with foot numbness.   Does not take calcium.  One yogurt per day and takes MVI.  Planning to go to [place] 02/24/16 and will do a lot of walking.   GYNECOLOGIC HISTORY: Patient's last menstrual period was 01/06/1993. Contraception:  Hysterectomy Menopausal hormone therapy:  Estradiol Last mammogram:  01/16/16 BIRADS 2 benign Last pap smear:   10/18/14, Negative        OB History    Gravida Para Term Preterm AB Living   _0 0 0 3   SAB TAB Ectopic Multiple Live Births   0 0 0 0 3         Patient Active Problem List   Diagnosis Date Noted  . 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 Primary osteoarthritis of both hands 01/11/2016  . 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/11/2016  . High risk medication use 11/15/2015  . Sjogren's syndrome ([place]) 10/31/2015  . Chronic constipation 10/23/2015  . Rosacea 10/23/2015  . Rheumatoid arthritis ([place]) 03/29/2015  . Abnormal TSH 03/13/2014  . Coronary artery calcification seen on CT scan 11/17/2013  . Recurrent sinusitis 09/16/2013  . Encounter for Medicare annual wellness exam 01/31/2013  . Dyspnea 09/14/2012  . Nodule of right lung 09/14/2012  . 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 Hyperlipidemia 05/08/2009  . DRY  EYE SYNDROME 05/08/2009  . ASTHMA 05/08/2009  . GERD 05/08/2009  . MENOPAUSAL DISORDER 05/08/2009  . ARTHRITIS, RHEUMATOID 05/08/2009  . Osteoporosis 05/08/2009  . SPONDYLOLISTHESIS 05/08/2009  . Sleep apnea 05/08/2009    Past Medical History:  Diagnosis Date  . Asthma   . Fibroid   . GERD (gastroesophageal reflux disease)   . Insomnia   . Menopausal symptoms   . Osteoporosis   . Postmenopausal HRT (hormone replacement therapy)   . RA (rheumatoid arthritis) ([place]) 11/2004   methotrexate and Embrel  . Sleep apnea 2008   C-Pap  . Vitamin D deficiency     Past Surgical History:  Procedure Laterality Date  . ABDOMINAL HYSTERECTOMY  01/1993   partial with AP repair, secondary to fibroids  . BREAST SURGERY  1991   breast biopsy  . CYSTOCELE REPAIR  10/2007   with graft  . MELANOMA EXCISION Right 01/2012   excision right posterior arm  . NASAL SINUS SURGERY  07/1991  . VAGINAL DELIVERY     x3    Current Outpatient Prescriptions  Medication Sig Dispense Refill  . albuterol (PROAIR HFA) 108 (90 BASE) MCG/ACT inhaler Inhale 2 puffs into the lungs every 4 (four) hours as needed. 1 Inhaler 3  . aspirin 81 MG tablet Take 81 mg by mouth daily.     . Cholecalciferol (VITAMIN D3) 2000  UNITS TABS Take 1 tablet by mouth daily.      Marland Kitchen desonide (DESOWEN) 0.05 % lotion Apply topically as needed.    . diclofenac sodium (VOLTAREN) 1 % GEL Apply 1 application topically at bedtime.    . Estradiol 10 MCG TABS vaginal tablet Place 1 tablet (10 mcg total) vaginally 2 (two) times a week. 24 tablet 4  . Flaxseed, Linseed, (FLAX SEED OIL) 1000 MG CAPS Take by mouth daily.    . fluticasone (FLONASE) 50 MCG/ACT nasal spray USE 2 SPRAYS IN EACH NOSTRIL DAILY AS NEEDED FOR RHINITIS OR ALLERGIES 48 g 3  . Folic Acid-Vit Z6-XWR U04 (VIRT-GARD) 2.2-25-1 MG TABS Take 1 tablet by mouth daily. 90 each 4  . hydrocortisone 2.5 % ointment Apply 1 application topically as needed.     . linaclotide (LINZESS) 72  MCG capsule Take 1 capsule (72 mcg total) by mouth daily before breakfast. As needed for constipation 30 capsule 5  . Magnesium Malate 1250 (141.7 Mg) MG TABS Take by mouth daily.    . methotrexate 50 MG/2ML injection Inject 0.9 mLs (22.5 mg total) into the skin once a week. 12 mL 0  . metroNIDAZOLE (METROGEL) 0.75 % gel Apply 1 application topically 2 (two) times daily. 45 g 5  . Multiple Vitamins-Minerals (SENIOR MULTIVITAMIN PLUS PO) Take 1 capsule by mouth daily.    . naproxen sodium (ANAPROX) 220 MG tablet Take 220 mg by mouth daily as needed.    Marland Kitchen ORENCIA 125 MG/ML SOSY Inject one device sub q weekly 12 Syringe 0  . ranitidine (ZANTAC) 300 MG tablet Take 300 mg by mouth 2 (two) times daily.     . Tuberculin-Allergy Syringes 27G X 1/2" 1 ML KIT Inject 1 Syringe into the skin once a week. To be used weekly with methotrexate.    Marland Kitchen zolpidem (AMBIEN) 10 MG tablet Take 0.5 tablets (5 mg total) by mouth at bedtime as needed for sleep. 90 tablet 0   No current facility-administered medications for this visit.      ALLERGIES: Adalimumab; Diclofenac; Dicyclomine; Doxycycline hyclate; Fosamax [alendronate sodium]; Methotrexate derivatives; Nickel; Piroxicam; Salagen [pilocarpine hcl]; Sulfamethoxazole-trimethoprim; Tramadol hcl; and Plaquenil [hydroxychloroquine]  Family History  Problem Relation Age of Onset  . Breast cancer Sister 42  . Thyroid disease Sister     Social History   Social History  . Marital status: Married    Spouse name: N/A  . Number of children: N/A  . 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 Smoking status: Never Smoker  . Smokeless tobacco: Never Used  . Alcohol use No  . Drug use: No  . Sexual activity: Yes    Birth control/ protection: Surgical     Comment: TAH   Other Topics Concern  . Not on file   Social History Narrative  . No narrative on file    ROS:  Pertinent items are noted in  HPI.  PHYSICAL EXAMINATION:    BP 116/60 (BP Location: Right Arm, Patient Position: Sitting, Cuff Size: Normal)   Pulse 76   Resp 16   Ht 5' 6" (1.676 m)   Wt 163 lb (73.9 kg)   LMP 01/06/1993   BMI 26.31 kg/m     General appearance: alert, cooperative and appears stated age   ASSESSMENT  Borderline severe osteoporosis.  Hx vertebral fracture? Hx prior Actonel use.  GERD. Rheumatoid arthritis.  Joint pain.   PLAN  Discussion of osteoporosis, risk factors, and treatment options.  I discussed with the patient Reclast or Forteo.  She may not be a candidate for Prolia based on her tx with Orencia.  We discussed calcium and vit D intake and weight bearing exercise.  We reviewed strategies for reducing risk of fall.  She will see Dr. Estanislado Pandy for consultation regarding her osteoporosis and tx choices. Patient expresses appreciation for the information shared with her today.  An After Visit Summary was printed and given to the patient.  ___25___ minutes face to face time of which over 50% was spent in counseling.

## 2016-02-05 ENCOUNTER — Encounter: Payer: Self-pay | Admitting: Family Medicine

## 2016-02-07 ENCOUNTER — Ambulatory Visit: Payer: Medicare Other | Admitting: Rheumatology

## 2016-02-08 ENCOUNTER — Other Ambulatory Visit: Payer: Self-pay | Admitting: *Deleted

## 2016-02-08 MED ORDER — AZITHROMYCIN 250 MG PO TABS: ORAL_TABLET | ORAL | 0 refills | Status: DC

## 2016-02-08 MED ORDER — OSELTAMIVIR PHOSPHATE 75 MG PO CAPS: 75.0000 mg | ORAL_CAPSULE | Freq: Two times a day (BID) | ORAL | 0 refills | Status: DC

## 2016-02-18 ENCOUNTER — Ambulatory Visit (INDEPENDENT_AMBULATORY_CARE_PROVIDER_SITE_OTHER): Payer: Medicare Other | Admitting: Rheumatology

## 2016-02-18 ENCOUNTER — Encounter: Payer: Self-pay | Admitting: Rheumatology

## 2016-02-18 VITALS — BP 118/70 | HR 78 | Resp 14 | Ht 66.0 in | Wt 163.0 lb

## 2016-02-18 DIAGNOSIS — M25562 Pain in left knee: Secondary | ICD-10-CM | POA: Diagnosis not present

## 2016-02-18 DIAGNOSIS — Z79899 Other long term (current) drug therapy: Secondary | ICD-10-CM | POA: Diagnosis not present

## 2016-02-18 DIAGNOSIS — M0579 Rheumatoid arthritis with rheumatoid factor of multiple sites without organ or systems involvement: Secondary | ICD-10-CM | POA: Diagnosis not present

## 2016-02-18 DIAGNOSIS — G8929 Other chronic pain: Secondary | ICD-10-CM

## 2016-02-18 DIAGNOSIS — M25561 Pain in right knee: Secondary | ICD-10-CM | POA: Diagnosis not present

## 2016-02-18 DIAGNOSIS — M810 Age-related osteoporosis without current pathological fracture: Secondary | ICD-10-CM

## 2016-02-18 DIAGNOSIS — M170 Bilateral primary osteoarthritis of knee: Secondary | ICD-10-CM | POA: Diagnosis not present

## 2016-02-18 MED ORDER — LIDOCAINE HCL 1 % IJ SOLN
1.5000 mL | INTRAMUSCULAR | Status: AC | PRN
  Administered 2016-02-18: 1.5 mL

## 2016-02-18 MED ORDER — TRIAMCINOLONE ACETONIDE 40 MG/ML IJ SUSP
40.0000 mg | INTRAMUSCULAR | Status: AC | PRN
  Administered 2016-02-18: 40 mg via INTRA_ARTICULAR

## 2016-02-18 NOTE — Progress Notes (Signed)
Office Visit Note  Patient: Deanna Norris             Date of Birth: 1945-03-20           MRN: [id number]             PCP: Loura Pardon, MD Referring: Tower, [name], MD Visit Date: 02/18/2016 Occupation: @GUAROCC @    Subjective:  Injections (knee injections/ patient wants Euflexxa repeated ) History of osteoporosis wants to review and treat that; history of surgery we erosive rheumatoid arthritis and OA of bilateral knee joint  History of Present Illness: Deanna Norris is a 71 y.o. female   Last seen 10/24/2015.  Patient's rheumatoid arthritis is doing really well despite having severe review erosive disease. No joint pain swelling or stiffness. Patient is taking Orencia subcutaneous with good results. She is also taking methotrexate 0.9 mL's per week and folic acid 2 mg daily.  Patient has a history of osteoarthritis of the knees. She has responded well to Euflex injections back in June 2017 when she completed her third and final injection for both knees. She was 100% better for significant amount of time as a result of the Euflex injections. She states that when she had 2 injections done she was 80% better. She presents today for cortisone injections in both knees because she is traveling to [place] and she wants to address her current pain which she rates as 7 on a scale of 0-10 and the left knee and 6 in the right knee before she goes on her trip next week. She plans to do a lot of walking on uneven pavement for around 7-10 days. I'm agreeable and I'm happy to give her a cortisone injection today. In the meanwhile we will apply for Euflex injections for both knees start that promptly as soon as it is approved.  Patient also has a history of osteoporosis based on exam done on 01/16/2016 which showed a T score of -3.4. Because patient has history of reflux, we are unable to use Fosamax Because she is on a biologic were unable to use probably a Patient is agreeable to try  re-class and I'll order the blood work appropriate to start that medication in the future. I'll send a message to Dr. Estanislado Pandy so she can review treatment option and we'll fortify appropriate  Activities of Daily Living:  Patient reports morning stiffness for 15 minutes.   Patient Reports nocturnal pain.  Difficulty dressing/grooming: Denies Difficulty climbing stairs: Reports Difficulty getting out of chair: Denies Difficulty using hands for taps, buttons, cutlery, and/or writing: Denies   Review of Systems  Constitutional: Negative for fatigue.  HENT: Negative for mouth sores and mouth dryness.   Eyes: Negative for dryness.  Respiratory: Negative for shortness of breath.   Gastrointestinal: Negative for constipation and diarrhea.  Musculoskeletal: Negative for myalgias and myalgias.  Skin: Negative for sensitivity to sunlight.  Psychiatric/Behavioral: Negative for decreased concentration and sleep disturbance.    PMFS History:  Patient Active Problem List   Diagnosis Date Noted  . 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 Primary osteoarthritis of both hands 01/11/2016  . 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/11/2016  . High risk medication use 11/15/2015  . Sjogren's syndrome ([place]) 10/31/2015  . Chronic constipation 10/23/2015  . Rosacea 10/23/2015  . Rheumatoid arthritis ([place]) 03/29/2015  . Abnormal TSH 03/13/2014  . Coronary artery calcification seen on CT scan 11/17/2013  . Recurrent sinusitis 09/16/2013  .  Encounter for Medicare annual wellness exam 01/31/2013  . Dyspnea 09/14/2012  . Nodule of right lung 09/14/2012  . 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 Hyperlipidemia 05/08/2009  . 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 ASTHMA 05/08/2009  . GERD 05/08/2009  . MENOPAUSAL DISORDER 05/08/2009  . ARTHRITIS, RHEUMATOID 05/08/2009  . Osteoporosis 05/08/2009  . SPONDYLOLISTHESIS 05/08/2009  . Sleep apnea 05/08/2009    Past Medical History:    Diagnosis Date  . Asthma   . Fibroid   . GERD (gastroesophageal reflux disease)   . Insomnia   . Menopausal symptoms   . Osteoporosis   . Postmenopausal HRT (hormone replacement therapy)   . RA (rheumatoid arthritis) ([place]) 11/2004   methotrexate and Embrel  . Sleep apnea 2008   C-Pap  . Vitamin D deficiency     Family History  Problem Relation Age of Onset  . Breast cancer Sister 25  . Thyroid disease Sister    Past Surgical History:  Procedure Laterality Date  . ABDOMINAL HYSTERECTOMY  01/1993   partial with AP repair, secondary to fibroids  . BREAST SURGERY  1991   breast biopsy  . CYSTOCELE REPAIR  10/2007   with graft  . MELANOMA EXCISION Right 01/2012   excision right posterior arm  . NASAL SINUS SURGERY  07/1991  . VAGINAL DELIVERY     x3   Social History   Social History Narrative  . No narrative on file     Objective: Vital Signs: BP 118/70   Pulse 78   Resp 14   Ht 5\' 6"  (1.676 m)   Wt 163 lb (73.9 kg)   LMP 01/06/1993   BMI 26.31 kg/m    Physical Exam  Constitutional: She is oriented to person, place, and time. She appears well-developed and well-nourished.  HENT:  Head: Normocephalic and atraumatic.  Eyes: EOM are normal. Pupils are equal, round, and reactive to light.  Cardiovascular: Normal rate, regular rhythm and normal heart sounds.  Exam reveals no gallop and no friction rub.   No murmur heard. Pulmonary/Chest: Effort normal and breath sounds normal. She has no wheezes. She has no rales.  Abdominal: Soft. Bowel sounds are normal. She exhibits no distension. There is no tenderness. There is no guarding. No hernia.  Musculoskeletal: Normal range of motion. She exhibits no edema, tenderness or deformity.  Lymphadenopathy:    She has no cervical adenopathy.  Neurological: She is alert and oriented to person, place, and time. Coordination normal.  Skin: Skin is warm and dry. Capillary refill takes less than 2 seconds. No rash noted.   Psychiatric: She has a normal mood and affect. Her behavior is normal.  Nursing note and vitals reviewed.    Musculoskeletal Exam:  Full range of motion of all joints Grip strength is equal and strong bilaterally Fiber myalgia tender points are all absent  CDAI Exam: CDAI Homunculus Exam:   Joint Counts:  CDAI Tender Joint count: 0 CDAI Swollen Joint count: 0  Global Assessments:  Patient Global Assessment: 7 Provider Global Assessment: 7  CDAI Calculated Score: 14    Investigation: No additional findings.   Imaging: No results found.  Speciality Comments: No specialty comments available.    Procedures:  Large Joint Inj Date/Time: 02/18/2016 4:51 PM Performed by: Eliezer Lofts Authorized by: Eliezer Lofts   Consent Given by:  Patient Site marked: the procedure site was marked   Timeout: prior to procedure the correct patient, procedure, and site was verified   Indications:  Pain and  joint swelling Location:  Knee Site:  R knee Prep: patient was prepped and draped in usual sterile fashion   Needle Size:  27 G Needle Length:  1.5 inches Approach:  Medial Ultrasound Guidance: No   Fluoroscopic Guidance: No   Arthrogram: No   Medications:  1.5 mL lidocaine 1 %; 40 mg triamcinolone acetonide 40 MG/ML Aspiration Attempted: Yes   Patient tolerance:  Patient tolerated the procedure well with no immediate complications  Bilateral knees were injected using the standard protocol. Patient tolerated procedure well. Patient is traveling [place] and wanted relief for her current knee pain which she described the left knee is having pain of 7 on a scale of 0-10 with the right knee having a pain of 6 on a scale of 0-10  Patient will start Euflex of bilateral knees 3 once approved. Last series was completed June 2017 with excellent results. Large Joint Inj Date/Time: 02/18/2016 4:52 PM Performed by: Eliezer Lofts Authorized by: Eliezer Lofts   Consent Given  by:  Patient Site marked: the procedure site was marked   Timeout: prior to procedure the correct patient, procedure, and site was verified   Indications:  Pain and joint swelling Location:  Knee Site:  L knee Prep: patient was prepped and draped in usual sterile fashion   Needle Size:  27 G Needle Length:  1.5 inches Approach:  Medial Ultrasound Guidance: No   Fluoroscopic Guidance: No   Arthrogram: No   Medications:  1.5 mL lidocaine 1 %; 40 mg triamcinolone acetonide 40 MG/ML Aspiration Attempted: Yes   Patient tolerance:  Patient tolerated the procedure well with no immediate complications   Allergies: Adalimumab; Diclofenac; Dicyclomine; Doxycycline hyclate; Fosamax [alendronate sodium]; Methotrexate derivatives; Nickel; Piroxicam; Salagen [pilocarpine hcl]; Sulfamethoxazole-trimethoprim; Tramadol hcl; and Plaquenil [hydroxychloroquine]   Assessment / Plan:     Visit Diagnoses: High risk medications (not anticoagulants) long-term use - Plan: CBC with Differential/Platelet, COMPLETE METABOLIC PANEL WITH GFR, VITAMIN D 25 Hydroxy (Vit-D Deficiency, Fractures), TSH, Magnesium, PTH, intact and calcium, Protein electrophoresis, serum  Rheumatoid arthritis with rheumatoid factor of multiple sites without organ or systems involvement (HCC)  Age-related osteoporosis without current pathological fracture - Plan: CBC with Differential/Platelet, COMPLETE METABOLIC PANEL WITH GFR, VITAMIN D 25 Hydroxy (Vit-D Deficiency, Fractures), TSH, Magnesium, PTH, intact and calcium, Protein electrophoresis, serum  Primary osteoarthritis of both knees - June 2017: Finish Euflex at 3 bilateral knees with excellent results;  Bilateral chronic knee pain - 02/18/2016: Having bilateral knee pain with left worse than right.; Did well with Euflex at 3 bilateral knees completed June 2017;    Plan: #1: Bilateral knee OA. Currently having fair amount of pain to bilateral knees with left knee rated at 7 on a  scale of 0-10 with climbing stairs on the right knee rated about 6 on a scale of 0-10. Patient is planning on a vacation and requires treatment for this at this time. She is requesting cortisone. Her blood pressure is adequate and we'll be happy to give her cortisone injection in both knees per protocol. Please see procedure note.  #2: Apply for Euflex at 3 bilateral knees. The last Visco supplementation was 06/22/2015 and she is eligible to restart her Euflex injections now. She was having very good relief with these injections in the past. She was 80% better after 2 injections on her 06/22/2015 visit note. I've sent a message to share in  #3: History of osteoporosis. T score is -3.4. Patient has not been under treatment for osteoporosis.  She is only taking calcium multivitamin and anti-reflux medication I sent a message to Dr. Estanislado Pandy are confirming she is okay for restart reclast for this patient after labs are updated and normal  #4: History of rheumatoid arthritis. Doing well with her rent see subcutaneous  #5: For the osteoporosis treatment, we will need to do lab work as well as for the [place]; CBC with differential, CMP with GFR,Vitamin D, TSH, SPEP, Mg, PTH,      Orders: Orders Placed This Encounter  Procedures  . Large Joint Injection/Arthrocentesis  . Large Joint Injection/Arthrocentesis  . CBC with Differential/Platelet  . COMPLETE METABOLIC PANEL WITH GFR  . VITAMIN D 25 Hydroxy (Vit-D Deficiency, Fractures)  . TSH  . Magnesium  . PTH, intact and calcium  . Protein electrophoresis, serum   No orders of the defined types were placed in this encounter.   Face-to-face time spent with patient was 30 minutes. 50% of time was spent in counseling and coordination of care.  Follow-Up Instructions: Return in about 5 months (around 07/17/2016) for RA,Orencia SubQ,MTX 0.2ml; OA KJ; KJ bil knees; .   Eliezer Lofts, PA-C  Note - This record has been created using  [employer].  Chart creation errors have been sought, but may not always  have been located. Such creation errors do not reflect on  the standard of medical care.

## 2016-02-22 DIAGNOSIS — L718 Other rosacea: Secondary | ICD-10-CM | POA: Diagnosis not present

## 2016-02-22 DIAGNOSIS — D1801 Hemangioma of skin and subcutaneous tissue: Secondary | ICD-10-CM | POA: Diagnosis not present

## 2016-02-22 DIAGNOSIS — L812 Freckles: Secondary | ICD-10-CM | POA: Diagnosis not present

## 2016-02-22 DIAGNOSIS — L821 Other seborrheic keratosis: Secondary | ICD-10-CM | POA: Diagnosis not present

## 2016-02-22 DIAGNOSIS — Z85820 Personal history of malignant melanoma of skin: Secondary | ICD-10-CM | POA: Diagnosis not present

## 2016-02-22 DIAGNOSIS — L820 Inflamed seborrheic keratosis: Secondary | ICD-10-CM | POA: Diagnosis not present

## 2016-03-13 ENCOUNTER — Telehealth: Payer: Self-pay | Admitting: *Deleted

## 2016-03-13 NOTE — Telephone Encounter (Signed)
-----   Message from Eliezer Lofts, [name] sent at 02/27/2016  6:36 PM EST ----- Regarding: FW: Okay to start reclast on this patient with a T score of -3.2  DR D SAID ==>   Please, consider Rx for magic mouth wash    ----- Message ----- From: Bo Merino, MD Sent: 02/27/2016   6:18 PM To: Eliezer Lofts, PA-C Subject: RE: Faythe Ghee to start reclast on this patient wi#  Ok to restart Reclast as there are no other options.  SD ----- Message ----- From: Eliezer Lofts, PA-C Sent: 02/18/2016   4:35 PM To: Bo Merino, MD Subject: Okay to start reclast on this patient with a#    Okay to start reclast on this patient with a T score of -3.2  We cannot offer Fosamax since the patient has history of reflux We cannot offer Proteus as the patient is on a biologic I've ordered lab work appropriate to start re-class. Okay to start reclast if labs are normal?

## 2016-03-13 NOTE — Telephone Encounter (Signed)
Patient is coming for labs on 3/12/18prior to starting reclast. Please make sure reclast infusion will be covered under patient's insurance.

## 2016-03-13 NOTE — Telephone Encounter (Signed)
Patient advised she will need labs and once we get the results we will be able to get her restarted with the reclast.

## 2016-03-14 NOTE — Telephone Encounter (Signed)
Spoke with Bonnita Nasuti from [facility] [employer] who states that Deanna Norris is her secondary coverage. With medicare being her primary, they will cover 80% of the cost. [employer] will pick up the remaining 20%. No authorization is required for her Reclast infusion.   Reference number: [id number]  Phone number: [phone]

## 2016-03-20 ENCOUNTER — Telehealth: Payer: Self-pay | Admitting: Family Medicine

## 2016-03-20 ENCOUNTER — Other Ambulatory Visit: Payer: Self-pay | Admitting: *Deleted

## 2016-03-20 DIAGNOSIS — I2510 Atherosclerotic heart disease of native coronary artery without angina pectoris: Secondary | ICD-10-CM

## 2016-03-20 DIAGNOSIS — M810 Age-related osteoporosis without current pathological fracture: Secondary | ICD-10-CM

## 2016-03-20 DIAGNOSIS — Z79899 Other long term (current) drug therapy: Secondary | ICD-10-CM

## 2016-03-20 DIAGNOSIS — R7989 Other specified abnormal findings of blood chemistry: Secondary | ICD-10-CM

## 2016-03-20 DIAGNOSIS — M0579 Rheumatoid arthritis with rheumatoid factor of multiple sites without organ or systems involvement: Secondary | ICD-10-CM

## 2016-03-20 DIAGNOSIS — E7800 Pure hypercholesterolemia, unspecified: Secondary | ICD-10-CM

## 2016-03-20 NOTE — Telephone Encounter (Signed)
-----   Message from Ellamae Sia sent at 03/18/2016  2:12 PM EDT ----- Regarding: Lab orders for Monday, 3.19.18 Patient is scheduled for CPX labs, please order future labs, Thanks , Karna Christmas

## 2016-03-24 ENCOUNTER — Other Ambulatory Visit: Payer: Medicare Other

## 2016-03-24 ENCOUNTER — Other Ambulatory Visit (INDEPENDENT_AMBULATORY_CARE_PROVIDER_SITE_OTHER): Payer: Medicare Other

## 2016-03-24 DIAGNOSIS — R946 Abnormal results of thyroid function studies: Secondary | ICD-10-CM | POA: Diagnosis not present

## 2016-03-24 DIAGNOSIS — I2510 Atherosclerotic heart disease of native coronary artery without angina pectoris: Secondary | ICD-10-CM

## 2016-03-24 DIAGNOSIS — M0579 Rheumatoid arthritis with rheumatoid factor of multiple sites without organ or systems involvement: Secondary | ICD-10-CM | POA: Diagnosis not present

## 2016-03-24 DIAGNOSIS — E7800 Pure hypercholesterolemia, unspecified: Secondary | ICD-10-CM

## 2016-03-24 DIAGNOSIS — R7989 Other specified abnormal findings of blood chemistry: Secondary | ICD-10-CM

## 2016-03-24 LAB — LIPID PANEL
CHOL/HDL RATIO: 3
Cholesterol: 196 mg/dL (ref 0–200)
HDL: 64.4 mg/dL (ref >39.00)
LDL CALC: 115 mg/dL — AB (ref 0–99)
NonHDL: 131.99
TRIGLYCERIDES: 85 mg/dL (ref 0.0–149.0)
VLDL: 17 mg/dL (ref 0.0–40.0)

## 2016-03-24 LAB — COMPREHENSIVE METABOLIC PANEL
ALT: 24 U/L (ref 0–35)
AST: 24 U/L (ref 0–37)
Albumin: 3.7 g/dL (ref 3.5–5.2)
Alkaline Phosphatase: 86 U/L (ref 39–117)
BUN: 12 mg/dL (ref 6–23)
CALCIUM: 8.9 mg/dL (ref 8.4–10.5)
CHLORIDE: 106 meq/L (ref 96–112)
CO2: 29 meq/L (ref 19–32)
Creatinine, Ser: 0.61 mg/dL (ref 0.40–1.20)
GFR: 102.93 mL/min (ref >60.00)
Glucose, Bld: 89 mg/dL (ref 70–99)
POTASSIUM: 3.8 meq/L (ref 3.5–5.1)
Sodium: 141 mEq/L (ref 135–145)
Total Bilirubin: 0.5 mg/dL (ref 0.2–1.2)
Total Protein: 6.2 g/dL (ref 6.0–8.3)

## 2016-03-24 LAB — CBC WITH DIFFERENTIAL/PLATELET
BASOS PCT: 1.4 % (ref 0.0–3.0)
Basophils Absolute: 0.1 10*3/uL (ref 0.0–0.1)
EOS ABS: 0.3 10*3/uL (ref 0.0–0.7)
EOS PCT: 4.4 % (ref 0.0–5.0)
HCT: 44.5 % (ref 36.0–46.0)
Hemoglobin: 15.1 g/dL — ABNORMAL HIGH (ref 12.0–15.0)
Lymphocytes Relative: 40.6 % (ref 12.0–46.0)
Lymphs Abs: 2.4 10*3/uL (ref 0.7–4.0)
MCHC: 33.9 g/dL (ref 30.0–36.0)
MCV: 94.5 fl (ref 78.0–100.0)
MONOS PCT: 9.4 % (ref 3.0–12.0)
Monocytes Absolute: 0.5 10*3/uL (ref 0.1–1.0)
NEUTROS ABS: 2.6 10*3/uL (ref 1.4–7.7)
Neutrophils Relative %: 44.2 % (ref 43.0–77.0)
PLATELETS: 272 10*3/uL (ref 150.0–400.0)
RBC: 4.71 Mil/uL (ref 3.87–5.11)
RDW: 14.9 % (ref 11.5–15.5)
WBC: 5.9 10*3/uL (ref 4.0–10.5)

## 2016-03-24 LAB — TSH: TSH: 2.86 u[IU]/mL (ref 0.35–4.50)

## 2016-03-31 ENCOUNTER — Encounter: Payer: Self-pay | Admitting: Family Medicine

## 2016-03-31 ENCOUNTER — Ambulatory Visit (INDEPENDENT_AMBULATORY_CARE_PROVIDER_SITE_OTHER): Payer: Medicare Other | Admitting: Family Medicine

## 2016-03-31 ENCOUNTER — Ambulatory Visit (INDEPENDENT_AMBULATORY_CARE_PROVIDER_SITE_OTHER): Payer: Medicare Other

## 2016-03-31 VITALS — BP 108/70 | HR 59 | Temp 97.7°F | Ht 65.75 in | Wt 159.5 lb

## 2016-03-31 DIAGNOSIS — Z0000 Encounter for general adult medical examination without abnormal findings: Secondary | ICD-10-CM

## 2016-03-31 DIAGNOSIS — M3500 Sicca syndrome, unspecified: Secondary | ICD-10-CM | POA: Diagnosis not present

## 2016-03-31 DIAGNOSIS — E7800 Pure hypercholesterolemia, unspecified: Secondary | ICD-10-CM | POA: Diagnosis not present

## 2016-03-31 DIAGNOSIS — I2510 Atherosclerotic heart disease of native coronary artery without angina pectoris: Secondary | ICD-10-CM

## 2016-03-31 DIAGNOSIS — M0579 Rheumatoid arthritis with rheumatoid factor of multiple sites without organ or systems involvement: Secondary | ICD-10-CM | POA: Diagnosis not present

## 2016-03-31 DIAGNOSIS — M810 Age-related osteoporosis without current pathological fracture: Secondary | ICD-10-CM | POA: Diagnosis not present

## 2016-03-31 NOTE — Progress Notes (Signed)
Pre visit review using our clinic review tool, if applicable. No additional management support is needed unless otherwise documented below in the visit note. 

## 2016-03-31 NOTE — Progress Notes (Signed)
Subjective:   Deanna Norris is a 71 y.o. female who presents for Medicare Annual (Subsequent) preventive examination.  Review of Systems:  N/A Cardiac Risk Factors include: advanced age (>36mn, >>92women);dyslipidemia     Objective:     Vitals: BP 108/70 (BP Location: Left Arm, Patient Position: Sitting, Cuff Size: Normal)   Pulse (!) 59   Temp 97.7 F (36.5 C) (Oral)   Ht 5' 5.75" (1.67 m)   Wt 159 lb 8 oz (72.3 kg)   LMP 01/06/1993   SpO2 96%   BMI 25.94 kg/m   Body mass index is 25.94 kg/m.   Tobacco History  Smoking Status  . Never Smoker  Smokeless Tobacco  . Never Used     Counseling given: No   Past Medical History:  Diagnosis Date  . Asthma   . Fibroid   . GERD (gastroesophageal reflux disease)   . Insomnia   . Menopausal symptoms   . Osteoporosis   . Postmenopausal HRT (hormone replacement therapy)   . RA (rheumatoid arthritis) ([place] 11/2004   methotrexate and Embrel  . Sleep apnea 2008   C-Pap  . Vitamin D deficiency    Past Surgical History:  Procedure Laterality Date  . ABDOMINAL HYSTERECTOMY  01/1993   partial with AP repair, secondary to fibroids  . BREAST SURGERY  1991   breast biopsy  . CYSTOCELE REPAIR  10/2007   with graft  . MELANOMA EXCISION Right 01/2012   excision right posterior arm  . NASAL SINUS SURGERY  07/1991  . VAGINAL DELIVERY     x3   Family History  Problem Relation Age of Onset  . Breast cancer Sister 644 . Thyroid disease Sister    History  Sexual Activity  . Sexual activity: Yes  . Birth control/ protection: Surgical    Comment: TAH    Outpatient Encounter Prescriptions as of 03/31/2016  Medication Sig  . albuterol (PROAIR HFA) 108 (90 BASE) MCG/ACT inhaler Inhale 2 puffs into the lungs every 4 (four) hours as needed.  .Marland Kitchenaspirin 81 MG tablet Take 81 mg by mouth daily.   . Calcium Carbonate-Vitamin D (CALCIUM 500/D PO) Take 1 tablet by mouth 2 (two) times daily.  . Cholecalciferol (VITAMIN D3) 2000  UNITS TABS Take 1 tablet by mouth daily.    .Marland Kitchendesonide (DESOWEN) 0.05 % lotion Apply topically as needed.  . diclofenac sodium (VOLTAREN) 1 % GEL Apply 1 application topically at bedtime as needed.   .Mariane BaumgartenCalcium (STOOL SOFTENER PO) Take 1 capsule by mouth 2 (two) times daily.  . Estradiol 10 MCG TABS vaginal tablet Place 1 tablet (10 mcg total) vaginally 2 (two) times a week.  . Flaxseed, Linseed, (FLAX SEED OIL) 1000 MG CAPS Take by mouth daily.  . fluticasone (FLONASE) 50 MCG/ACT nasal spray USE 2 SPRAYS IN EACH NOSTRIL DAILY AS NEEDED FOR RHINITIS OR ALLERGIES  . Folic Acid-Vit BQ8-GNOBI37(VIRT-GARD) 2.2-25-1 MG TABS Take 1 tablet by mouth daily.  . hydrocortisone 2.5 % ointment Apply 1 application topically as needed.   . Magnesium Malate 1250 (141.7 Mg) MG TABS Take 3 tablets by mouth daily.   . methotrexate 50 MG/2ML injection Inject 0.9 mLs (22.5 mg total) into the skin once a week.  . metroNIDAZOLE (METROGEL) 0.75 % gel Apply 1 application topically 2 (two) times daily.  . Multiple Vitamins-Minerals (SENIOR MULTIVITAMIN PLUS PO) Take 1 capsule by mouth daily.  . naproxen sodium (ANAPROX) 220 MG tablet Take 220  mg by mouth daily as needed.  Marland Kitchen ORENCIA 125 MG/ML SOSY Inject one device sub q weekly  . ranitidine (ZANTAC) 300 MG tablet Take 300 mg by mouth 2 (two) times daily.   . Tuberculin-Allergy Syringes 27G X 1/2" 1 ML KIT Inject 1 Syringe into the skin once a week. To be used weekly with methotrexate.  Marland Kitchen zolpidem (AMBIEN) 10 MG tablet Take 0.5 tablets (5 mg total) by mouth at bedtime as needed for sleep.   No facility-administered encounter medications on file as of 03/31/2016.     Activities of Daily Living In your present state of health, do you have any difficulty performing the following activities: 03/31/2016  Hearing? Y  Vision? N  Difficulty concentrating or making decisions? N  Walking or climbing stairs? N  Dressing or bathing? N  Doing errands, shopping? N    Preparing Food and eating ? N  Using the Toilet? N  In the past six months, have you accidently leaked urine? N  Do you have problems with loss of bowel control? N  Managing your Medications? N  Managing your Finances? N  Housekeeping or managing your Housekeeping? N  Some recent data might be hidden    Patient Care Team: Abner Greenspan, MD as PCP - General Jarome Matin, MD as Consulting Physician (Dermatology) Juanita Craver, MD as Consulting Physician (Gastroenterology) Marica Otter, OD as Consulting Physician (Optometry) Belva Crome, MD as Consulting Physician (Cardiology)    Assessment:    Hearing Screening Comments: Spring 2017 formal hearing exam with audiologist. Hearing loss noted; fitted for hearing aids but they did not work. Told she was not a good candidate for hearing aids at this time.  Vision Screening Comments: Last vision exam in July 2017 with Dr. Marica Otter  Exercise Activities and Dietary recommendations Current Exercise Habits: Home exercise routine, Type of exercise: walking, Time (Minutes): 20, Frequency (Times/Week): 3, Weekly Exercise (Minutes/Week): 60, Intensity: Mild, Exercise limited by: None identified  Goals    . Increase physical activity          Starting 03/31/2016, I would like to continue to exercise for 20 min 2-3 days per week.       Fall Risk Fall Risk  03/31/2016 03/28/2015 02/13/2014 02/09/2013 02/09/2012  Falls in the past year? No No No Yes No  Number falls in past yr: - - - 2 or more -  Risk Factor Category  - - - High Fall Risk -   Depression Screen PHQ 2/9 Scores 03/31/2016 03/28/2015 02/13/2014 02/09/2013  PHQ - 2 Score 0 0 0 0     Cognitive Function MMSE - Mini Mental State Exam 03/31/2016 03/28/2015  Orientation to time 5 5  Orientation to Place 5 5  Registration 3 3  Attention/ Calculation 0 0  Recall 3 3  Language- name 2 objects 0 0  Language- repeat 1 1  Language- follow 3 step command 3 3  Language- read & follow direction 0 0   Write a sentence 0 0  Copy design 0 0  Total score 20 20       PLEASE NOTE: A Mini-Cog screen was completed. Maximum score is 20. A value of 0 denotes this part of [place] MMSE was not completed or the patient failed this part of the Mini-Cog screening.   Mini-Cog Screening Orientation to Time - Max 5 pts Orientation to Place - Max 5 pts Registration - Max 3 pts Recall - Max 3 pts Language Repeat - Max  1 pts Language Follow 3 Step Command - Max 3 pts   Immunization History  Administered Date(s) Administered  . DTaP 04/06/2013  . Influenza Split 10/22/2010  . Influenza Whole 10/10/2009, 10/20/2011  . Influenza, Seasonal, Injecte, Preservative Fre 10/06/2012  . Influenza,inj,Quad PF,36+ Mos 10/30/2014  . Influenza-Unspecified 10/26/2013, 10/22/2015  . Pneumococcal Conjugate-13 02/13/2014  . Pneumococcal Polysaccharide-23 06/12/2010, 03/28/2015  . Td 02/07/2003, 03/23/2013  . Tdap 03/23/2013  . Zoster 05/08/2014   Screening Tests Health Maintenance  Topic Date Due  . MAMMOGRAM  01/15/2017  . COLONOSCOPY  01/13/2023  . TETANUS/TDAP  03/24/2023  . INFLUENZA VACCINE  Completed  . DEXA SCAN  Completed  . Hepatitis C Screening  Completed  . PNA vac Low Risk Adult  Completed      Plan:     I have personally reviewed and addressed the Medicare Annual Wellness questionnaire and have noted the following in the patient's chart:  A. Medical and social history B. Use of alcohol, tobacco or illicit drugs  C. Current medications and supplements D. Functional ability and status E.  Nutritional status F.  Physical activity G. Advance directives H. List of other physicians I.  Hospitalizations, surgeries, and ER visits in previous 12 months J.  [place] to include hearing, vision, cognitive, depression L. Referrals and appointments - none  In addition, I have reviewed and discussed with patient certain preventive protocols, quality metrics, and best practice  recommendations. A written personalized care plan for preventive services as well as general preventive health recommendations were provided to patient.  See attached scanned questionnaire for additional information.   Signed,   Lindell Noe, MHA, BS, LPN Health Coach

## 2016-03-31 NOTE — Assessment & Plan Note (Signed)
Under care of rheumatology doing better  Methotrexate and orencia

## 2016-03-31 NOTE — Assessment & Plan Note (Signed)
Disc goals for lipids and reasons to control them Rev labs with pt Rev low sat fat diet in detail Fairly well controlled with diet

## 2016-03-31 NOTE — Assessment & Plan Note (Signed)
dexa 1/18 worse at FN No falls or fx About to start reclast (from rheumatology) Disc need for calcium/ vitamin D/ wt bearing exercise and bone density test every 2 y to monitor Disc safety/ fracture risk in detail

## 2016-03-31 NOTE — Progress Notes (Signed)
PCP notes:   Health maintenance:  No gaps identified.  Abnormal screenings:   None  Patient concerns:   None  Nurse concerns:  None  Next PCP appt:   03/31/16 @ 0930  I reviewed health advisor's note, was available for consultation, and agree with documentation and plan. Loura Pardon MD

## 2016-03-31 NOTE — Patient Instructions (Signed)
Deanna Norris , Thank you for taking time to come for your Medicare Wellness Visit. I appreciate your ongoing commitment to your health goals. Please review the following plan we discussed and let me know if I can assist you in the future.   These are the goals we discussed: Goals    . Increase physical activity          Starting 03/31/2016, I would like to continue to exercise for 20 min 2-3 days per week.        This is a list of the screening recommended for you and due dates:  Health Maintenance  Topic Date Due  . Mammogram  01/15/2017  . Colon Cancer Screening  01/13/2023  . Tetanus Vaccine  03/24/2023  . Flu Shot  Completed  . DEXA scan (bone density measurement)  Completed  .  Hepatitis C: One time screening is recommended by Center for Disease Control  (CDC) for  adults born from [birth year] through 1965.   Completed  . Pneumonia vaccines  Completed   Preventive Care for Adults  A healthy lifestyle and preventive care can promote health and wellness. Preventive health guidelines for adults include the following key practices.  . A routine yearly physical is a good way to check with your health care provider about your health and preventive screening. It is a chance to share any concerns and updates on your health and to receive a thorough exam.  . Visit your dentist for a routine exam and preventive care every 6 months. Brush your teeth twice a day and floss once a day. Good oral hygiene prevents tooth decay and gum disease.  . The frequency of eye exams is based on your age, health, family medical history, use  of contact lenses, and other factors. Follow your health care provider's ecommendations for frequency of eye exams.  . Eat a healthy diet. Foods like vegetables, fruits, whole grains, low-fat dairy products, and lean protein foods contain the nutrients you need without too many calories. Decrease your intake of foods high in solid fats, added sugars, and salt. Eat the right  amount of calories for you. Get information about a proper diet from your health care provider, if necessary.  . Regular physical exercise is one of the most important things you can do for your health. Most adults should get at least 150 minutes of moderate-intensity exercise (any activity that increases your heart rate and causes you to sweat) each week. In addition, most adults need muscle-strengthening exercises on 2 or more days a week.  Silver Sneakers may be a benefit available to you. To determine eligibility, you may visit the website: www.silversneakers.com or contact program at [id number] Mon-Fri between 8AM-8PM.   . Maintain a healthy weight. The body mass index (BMI) is a screening tool to identify possible weight problems. It provides an estimate of body fat based on height and weight. Your health care provider can find your BMI and can help you achieve or maintain a healthy weight.   For adults 20 years and older: ? A BMI below 18.5 is considered underweight. ? A BMI of 18.5 to 24.9 is normal. ? A BMI of 25 to 29.9 is considered overweight. ? A BMI of 30 and above is considered obese.   . Maintain normal blood lipids and cholesterol levels by exercising and minimizing your intake of saturated fat. Eat a balanced diet with plenty of fruit and vegetables. Blood tests for lipids and cholesterol should begin at age  20 and be repeated every 5 years. If your lipid or cholesterol levels are high, you are over 50, or you are at high risk for heart disease, you may need your cholesterol levels checked more frequently. Ongoing high lipid and cholesterol levels should be treated with medicines if diet and exercise are not working.  . If you smoke, find out from your health care provider how to quit. If you do not use tobacco, please do not start.  . If you choose to drink alcohol, please do not consume more than 2 drinks per day. One drink is considered to be 12 ounces (355 mL) of beer, 5  ounces (148 mL) of wine, or 1.5 ounces (44 mL) of liquor.  . If you are 8-53 years old, ask your health care provider if you should take aspirin to prevent strokes.  . Use sunscreen. Apply sunscreen liberally and repeatedly throughout the day. You should seek shade when your shadow is shorter than you. Protect yourself by wearing long sleeves, pants, a wide-brimmed hat, and sunglasses year round, whenever you are outdoors.  . Once a month, do a whole body skin exam, using a mirror to look at the skin on your back. Tell your health care provider of new moles, moles that have irregular borders, moles that are larger than a pencil eraser, or moles that have changed in shape or color.

## 2016-03-31 NOTE — Patient Instructions (Signed)
Take care of yourself Labs look good  Stay active

## 2016-03-31 NOTE — Progress Notes (Signed)
Subjective:    Patient ID: Deanna Norris, female    DOB: June 29, 1945, 71 y.o.   MRN: [id number]  HPI Here for annual f/u of chronic medical problem   Feeling fine  Taking care of herself   Wt Readings from Last 3 Encounters:  03/31/16 159 lb 8 oz (72.3 kg)  02/18/16 163 lb (73.9 kg)  02/04/16 163 lb (73.9 kg)  working hard to loose and mt wt  In [employer] program  bmi 25.  Mammogram 1/18 nl  Sister had breast cancer  Self breast exam - no lumps / sees gyn   Pap/gyn care  10/16 nl pap  Hysterectomy in the past  No problems   utd vaccines incl zoster   Colonoscopy/ screening 1/15 nl with 10 y recall  Has internal hemorrhoids - has bleeding from those  Some chronic constipation - she has been px linzess / her rheum had her take magnesium  Has not needed the linzess   Had hep C screen -neg   dexa 1/18 Worsened OP at [facility]  No falls or fractures  Was on bisphosphenate years ago (close to 5 years)  Has hx of acid reflux  Rheumatologist is going to initiate reclast    Hx of melanoma -she goes to derm every 6 months  Had one mole on leg - basal cell /removed   Doing better with RA/ sjogrens Feeling better    Hx of hyperlipidemia Lab Results  Component Value Date   CHOL 196 03/24/2016   CHOL 211 (H) 03/23/2015   CHOL 190 02/03/2014   Lab Results  Component Value Date   HDL 64.40 03/24/2016   HDL 65.60 03/23/2015   HDL 56.80 02/03/2014   Lab Results  Component Value Date   LDLCALC 115 (H) 03/24/2016   LDLCALC 119 (H) 03/23/2015   LDLCALC 108 (H) 02/03/2014   Lab Results  Component Value Date   TRIG 85.0 03/24/2016   TRIG 133.0 03/23/2015   TRIG 127.0 02/03/2014   Lab Results  Component Value Date   CHOLHDL 3 03/24/2016   CHOLHDL 3 03/23/2015   CHOLHDL 3 02/03/2014   Lab Results  Component Value Date   LDLDIRECT 144.2 02/02/2013   LDLDIRECT 137.3 02/02/2012   Pretty good profile  Eating very healthy  Results for orders placed or  performed in visit on 03/24/16  CBC with Differential/Platelet  Result Value Ref Range   WBC 5.9 4.0 - 10.5 K/uL   RBC 4.71 3.87 - 5.11 Mil/uL   Hemoglobin 15.1 (H) 12.0 - 15.0 g/dL   HCT 44.5 36.0 - 46.0 %   MCV 94.5 78.0 - 100.0 fl   MCHC 33.9 30.0 - 36.0 g/dL   RDW 14.9 11.5 - 15.5 %   Platelets 272.0 150.0 - 400.0 K/uL   Neutrophils Relative % 44.2 43.0 - 77.0 %   Lymphocytes Relative 40.6 12.0 - 46.0 %   Monocytes Relative 9.4 3.0 - 12.0 %   Eosinophils Relative 4.4 0.0 - 5.0 %   Basophils Relative 1.4 0.0 - 3.0 %   Neutro Abs 2.6 1.4 - 7.7 K/uL   Lymphs Abs 2.4 0.7 - 4.0 K/uL   Monocytes Absolute 0.5 0.1 - 1.0 K/uL   Eosinophils Absolute 0.3 0.0 - 0.7 K/uL   Basophils Absolute 0.1 0.0 - 0.1 K/uL  Comprehensive metabolic panel  Result Value Ref Range   Sodium 141 135 - 145 mEq/L   Potassium 3.8 3.5 - 5.1 mEq/L   Chloride 106 96 -  112 mEq/L   CO2 29 19 - 32 mEq/L   Glucose, Bld 89 70 - 99 mg/dL   BUN 12 6 - 23 mg/dL   Creatinine, Ser 0.61 0.40 - 1.20 mg/dL   Total Bilirubin 0.5 0.2 - 1.2 mg/dL   Alkaline Phosphatase 86 39 - 117 U/L   AST 24 0 - 37 U/L   ALT 24 0 - 35 U/L   Total Protein 6.2 6.0 - 8.3 g/dL   Albumin 3.7 3.5 - 5.2 g/dL   Calcium 8.9 8.4 - 10.5 mg/dL   GFR 102.93 >60.00 mL/min  Lipid panel  Result Value Ref Range   Cholesterol 196 0 - 200 mg/dL   Triglycerides 85.0 0.0 - 149.0 mg/dL   HDL 64.40 >39.00 mg/dL   VLDL 17.0 0.0 - 40.0 mg/dL   LDL Cholesterol 115 (H) 0 - 99 mg/dL   Total CHOL/HDL Ratio 3    NonHDL 131.99   TSH  Result Value Ref Range   TSH 2.86 0.35 - 4.50 uIU/mL   *  Patient Active Problem List   Diagnosis Date Noted  . 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 Primary osteoarthritis of both hands 01/11/2016  . 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/11/2016  . High risk medication use 11/15/2015  . Sjogren's syndrome ([place]) 10/31/2015  . Chronic constipation 10/23/2015  . Rosacea 10/23/2015    . Rheumatoid arthritis ([place]) 03/29/2015  . Coronary artery calcification seen on CT scan 11/17/2013  . Recurrent sinusitis 09/16/2013  . Encounter for Medicare annual wellness exam 01/31/2013  . Dyspnea 09/14/2012  . Nodule of right lung 09/14/2012  . 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 Hyperlipidemia 05/08/2009  . 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 ASTHMA 05/08/2009  . GERD 05/08/2009  . MENOPAUSAL DISORDER 05/08/2009  . ARTHRITIS, RHEUMATOID 05/08/2009  . Osteoporosis 05/08/2009  . SPONDYLOLISTHESIS 05/08/2009  . Sleep apnea 05/08/2009   Past Medical History:  Diagnosis Date  . Asthma   . Fibroid   . GERD (gastroesophageal reflux disease)   . Insomnia   . Menopausal symptoms   . Osteoporosis   . Postmenopausal HRT (hormone replacement therapy)   . RA (rheumatoid arthritis) ([place]) 11/2004   methotrexate and Embrel  . Sleep apnea 2008   C-Pap  . Vitamin D deficiency    Past Surgical History:  Procedure Laterality Date  . ABDOMINAL HYSTERECTOMY  01/1993   partial with AP repair, secondary to fibroids  . BREAST SURGERY  1991   breast biopsy  . CYSTOCELE REPAIR  10/2007   with graft  . MELANOMA EXCISION Right 01/2012   excision right posterior arm  . NASAL SINUS SURGERY  07/1991  . VAGINAL DELIVERY     x3   Social History  Substance Use Topics  . Smoking status: Never Smoker  . Smokeless tobacco: Never Used  . Alcohol use No   Family History  Problem Relation Age of Onset  . Breast cancer Sister 51  . Thyroid disease Sister    Allergies  Allergen Reactions  . Adalimumab     REACTION: frequent sinus infection  . Diclofenac     unk  . Dicyclomine     Turn teeth gray  . Doxycycline Hyclate Other (See Comments)    "turns teeth gray"  . Fosamax [Alendronate Sodium]     Acid reflux  . Methotrexate Derivatives Nausea Only    Oral metotrexate  . Nickel   . Piroxicam     unk  .  Salagen [Pilocarpine Hcl] Other (See Comments)    Excessive sweating, headaches  .  Sulfamethoxazole-Trimethoprim     REACTION: face and hands rash and felt burning sensation  . Tramadol Hcl     REACTION: nausea  . Plaquenil [Hydroxychloroquine] Rash   Current Outpatient Prescriptions on File Prior to Visit  Medication Sig Dispense Refill  . albuterol (PROAIR HFA) 108 (90 BASE) MCG/ACT inhaler Inhale 2 puffs into the lungs every 4 (four) hours as needed. 1 Inhaler 3  . aspirin 81 MG tablet Take 81 mg by mouth daily.     . Cholecalciferol (VITAMIN D3) 2000 UNITS TABS Take 1 tablet by mouth daily.      Marland Kitchen desonide (DESOWEN) 0.05 % lotion Apply topically as needed.    . diclofenac sodium (VOLTAREN) 1 % GEL Apply 1 application topically at bedtime as needed.     . Estradiol 10 MCG TABS vaginal tablet Place 1 tablet (10 mcg total) vaginally 2 (two) times a week. 24 tablet 4  . Flaxseed, Linseed, (FLAX SEED OIL) 1000 MG CAPS Take by mouth daily.    . fluticasone (FLONASE) 50 MCG/ACT nasal spray USE 2 SPRAYS IN EACH NOSTRIL DAILY AS NEEDED FOR RHINITIS OR ALLERGIES 48 g 3  . hydrocortisone 2.5 % ointment Apply 1 application topically as needed.     . Magnesium Malate 1250 (141.7 Mg) MG TABS Take 3 tablets by mouth daily.     . methotrexate 50 MG/2ML injection Inject 0.9 mLs (22.5 mg total) into the skin once a week. 12 mL 0  . metroNIDAZOLE (METROGEL) 0.75 % gel Apply 1 application topically 2 (two) times daily. 45 g 5  . Multiple Vitamins-Minerals (SENIOR MULTIVITAMIN PLUS PO) Take 1 capsule by mouth daily.    . naproxen sodium (ANAPROX) 220 MG tablet Take 220 mg by mouth daily as needed.    Marland Kitchen ORENCIA 125 MG/ML SOSY Inject one device sub q weekly 12 Syringe 0  . ranitidine (ZANTAC) 300 MG tablet Take 300 mg by mouth 2 (two) times daily.     . Tuberculin-Allergy Syringes 27G X 1/2" 1 ML KIT Inject 1 Syringe into the skin once a week. To be used weekly with methotrexate.    Marland Kitchen zolpidem (AMBIEN) 10 MG tablet Take 0.5 tablets (5 mg total) by mouth at bedtime as needed for sleep. 90  tablet 0   No current facility-administered medications on file prior to visit.     Review of Systems Review of Systems  Constitutional: Negative for fever, appetite change, fatigue and unexpected weight change.  Eyes: Negative for pain and visual disturbance. pos for dry eye from [place] -stable  Respiratory: Negative for cough and shortness of breath.   Cardiovascular: Negative for cp or palpitations    Gastrointestinal: Negative for nausea, diarrhea and constipation.  Genitourinary: Negative for urgency and frequency.  Skin: Negative for pallor or rash   MSK pos for joint pain that is stable from RA Neurological: Negative for weakness, light-headedness, numbness and headaches.  Hematological: Negative for adenopathy. Does not bruise/bleed easily.  Psychiatric/Behavioral: Negative for dysphoric mood. The patient is not nervous/anxious.         Objective:   Physical Exam  Constitutional: She appears well-developed and well-nourished. No distress.  Well appearing   HENT:  Head: Normocephalic and atraumatic.  Right Ear: External ear normal.  Left Ear: External ear normal.  Nose: Nose normal.  Mouth/Throat: Oropharynx is clear and moist.  Eyes: Conjunctivae and EOM are normal. Pupils are equal, round,  and reactive to light. Right eye exhibits no discharge. Left eye exhibits no discharge. No scleral icterus.  Neck: Normal range of motion. Neck supple. No JVD present. Carotid bruit is not present. No thyromegaly present.  Cardiovascular: Normal rate, regular rhythm, normal heart sounds and intact distal pulses.  Exam reveals no gallop.   Pulmonary/Chest: Effort normal and breath sounds normal. No respiratory distress. She has no wheezes. She has no rales.  Abdominal: Soft. Bowel sounds are normal. She exhibits no distension and no mass. There is no tenderness.  Genitourinary:  Genitourinary Comments: She sees gyn for breast/pelvic exam  Musculoskeletal: She exhibits no edema or  tenderness.  No kyphosis   Lymphadenopathy:    She has no cervical adenopathy.  Neurological: She is alert. She has normal reflexes. No cranial nerve deficit. She exhibits normal muscle tone. Coordination normal.  Skin: Skin is warm and dry. No rash noted. No erythema. No pallor.  Solar lentigines diffusely   Psychiatric: She has a normal mood and affect.  cheerful          Assessment & Plan:   Problem List Items Addressed This Visit      Digestive   Sjogren's syndrome ([place]) - Primary    Under care of rheumatology doing better  Methotrexate and orencia          Musculoskeletal and Integument   Osteoporosis    dexa 1/18 worse at [facility] No falls or fx About to start reclast (from rheumatology) Disc need for calcium/ vitamin D/ wt bearing exercise and bone density test every 2 y to monitor Disc safety/ fracture risk in detail         Relevant Medications   Calcium Carbonate-Vitamin D (CALCIUM 500/D PO)   Rheumatoid arthritis ([place])    Doing better currently with methotrexate and Orencia  Managed by rheumatology        Other   Hyperlipidemia    Disc goals for lipids and reasons to control them Rev labs with pt Rev low sat fat diet in detail Fairly well controlled with diet

## 2016-03-31 NOTE — Assessment & Plan Note (Signed)
Doing better currently with methotrexate and Orencia  Managed by rheumatology

## 2016-04-01 ENCOUNTER — Other Ambulatory Visit: Payer: Self-pay | Admitting: *Deleted

## 2016-04-01 DIAGNOSIS — Z79899 Other long term (current) drug therapy: Secondary | ICD-10-CM | POA: Diagnosis not present

## 2016-04-01 DIAGNOSIS — M810 Age-related osteoporosis without current pathological fracture: Secondary | ICD-10-CM

## 2016-04-01 LAB — CBC WITH DIFFERENTIAL/PLATELET
BASOS PCT: 1 %
Basophils Absolute: 66 cells/uL (ref 0–200)
EOS PCT: 7 %
Eosinophils Absolute: 462 cells/uL (ref 15–500)
HCT: 44.5 % (ref 35.0–45.0)
Hemoglobin: 14.7 g/dL (ref 11.7–15.5)
LYMPHS PCT: 26 %
Lymphs Abs: 1716 cells/uL (ref 850–3900)
MCH: 31.7 pg (ref 27.0–33.0)
MCHC: 33 g/dL (ref 32.0–36.0)
MCV: 95.9 fL (ref 80.0–100.0)
MPV: 11.1 fL (ref 7.5–12.5)
Monocytes Absolute: 924 cells/uL (ref 200–950)
Monocytes Relative: 14 %
NEUTROS PCT: 52 %
Neutro Abs: 3432 cells/uL (ref 1500–7800)
PLATELETS: 224 10*3/uL (ref 140–400)
RBC: 4.64 MIL/uL (ref 3.80–5.10)
RDW: 14.7 % (ref 11.0–15.0)
WBC: 6.6 10*3/uL (ref 3.8–10.8)

## 2016-04-02 LAB — PTH, INTACT AND CALCIUM
Calcium: 9.1 mg/dL (ref 8.6–10.4)
PTH: 36 pg/mL (ref 14–64)

## 2016-04-02 LAB — TSH: TSH: 1.22 m[IU]/L

## 2016-04-02 LAB — VITAMIN D 25 HYDROXY (VIT D DEFICIENCY, FRACTURES): VIT D 25 HYDROXY: 51 ng/mL (ref 30–100)

## 2016-04-02 LAB — COMPLETE METABOLIC PANEL WITH GFR
ALT: 21 U/L (ref 6–29)
AST: 21 U/L (ref 10–35)
Albumin: 3.5 g/dL — ABNORMAL LOW (ref 3.6–5.1)
Alkaline Phosphatase: 87 U/L (ref 33–130)
BILIRUBIN TOTAL: 0.3 mg/dL (ref 0.2–1.2)
BUN: 14 mg/dL (ref 7–25)
CALCIUM: 9.1 mg/dL (ref 8.6–10.4)
CHLORIDE: 103 mmol/L (ref 98–110)
CO2: 22 mmol/L (ref 20–31)
CREATININE: 0.76 mg/dL (ref 0.60–0.93)
GFR, Est African American: >89 mL/min (ref >=60)
GFR, Est Non African American: 80 mL/min (ref >=60)
Glucose, Bld: 95 mg/dL (ref 65–99)
Potassium: 4.4 mmol/L (ref 3.5–5.3)
SODIUM: 139 mmol/L (ref 135–146)
Total Protein: 5.8 g/dL — ABNORMAL LOW (ref 6.1–8.1)

## 2016-04-02 LAB — MAGNESIUM: Magnesium: 2.2 mg/dL (ref 1.5–2.5)

## 2016-04-04 LAB — PROTEIN ELECTROPHORESIS, SERUM
Albumin ELP: 3.4 g/dL — ABNORMAL LOW (ref 3.8–4.8)
Alpha-1-Globulin: 0.3 g/dL (ref 0.2–0.3)
Alpha-2-Globulin: 0.6 g/dL (ref 0.5–0.9)
Beta 2: 0.3 g/dL (ref 0.2–0.5)
Beta Globulin: 0.4 g/dL (ref 0.4–0.6)
GAMMA GLOBULIN: 0.8 g/dL (ref 0.8–1.7)
Total Protein, Serum Electrophoresis: 5.8 g/dL — ABNORMAL LOW (ref 6.1–8.1)

## 2016-04-08 ENCOUNTER — Other Ambulatory Visit: Payer: Self-pay | Admitting: Rheumatology

## 2016-04-08 ENCOUNTER — Telehealth: Payer: Self-pay | Admitting: *Deleted

## 2016-04-08 MED ORDER — METHOTREXATE SODIUM CHEMO INJECTION 50 MG/2ML: 22.5000 mg | INTRAMUSCULAR | 0 refills | Status: DC

## 2016-04-08 MED ORDER — ORENCIA 125 MG/ML ~~LOC~~ SOSY: PREFILLED_SYRINGE | SUBCUTANEOUS | 0 refills | Status: DC

## 2016-04-08 NOTE — Telephone Encounter (Signed)
Last Visit: 01/14/16 Next Visit: 06/09/16 Labs: 04/01/16 CBC/CMP WNL TB GOLD: 04/25/15 Neg  Okay to refill MTX and Orencia?

## 2016-04-08 NOTE — Telephone Encounter (Signed)
Patient advised TB Gold is and she states she will come in next week to get that done.

## 2016-04-08 NOTE — Telephone Encounter (Signed)
Patient is requesting refill of Orencia be sent to [facility].  She is also requesting a refill of the MTX injectables be sent to [facility].

## 2016-04-08 NOTE — Telephone Encounter (Signed)
Tb gold due now.

## 2016-04-11 NOTE — Telephone Encounter (Signed)
Error

## 2016-04-16 ENCOUNTER — Other Ambulatory Visit: Payer: Self-pay | Admitting: *Deleted

## 2016-04-16 DIAGNOSIS — Z9225 Personal history of immunosupression therapy: Secondary | ICD-10-CM

## 2016-04-18 LAB — QUANTIFERON TB GOLD ASSAY (BLOOD)
Interferon Gamma Release Assay: NEGATIVE
Mitogen-Nil: 4.4 IU/mL
Quantiferon Nil Value: 0.03 IU/mL
Quantiferon Tb Ag Minus Nil Value: 0.03 IU/mL

## 2016-04-18 NOTE — Progress Notes (Signed)
TB gold negative

## 2016-04-22 ENCOUNTER — Ambulatory Visit (INDEPENDENT_AMBULATORY_CARE_PROVIDER_SITE_OTHER): Payer: Medicare Other | Admitting: Orthopaedic Surgery

## 2016-05-26 ENCOUNTER — Telehealth (INDEPENDENT_AMBULATORY_CARE_PROVIDER_SITE_OTHER): Payer: Self-pay | Admitting: Radiology

## 2016-05-26 NOTE — Telephone Encounter (Signed)
FW: Apply Euflex of both knees 3  Due: 1 week ago  Received: 1 week ago  Message Contents  Shona Needles, RT  Staria Birkhead, RT        new   Previous Messages    ----- Message -----  From: Eliezer Lofts, PA-C  Sent: 02/18/2016  4:37 PM  To: Shona Needles, RT  Subject: Apply Euflex of both knees 3           Apply for Euflex for both knees 3  Responded very well to Euflex back in June 2017      Please call patient and schedule Euflexxa x 3 bilateral knees, buy and bill, thanks.

## 2016-05-27 NOTE — Telephone Encounter (Signed)
Left message on machine for patient to call back to schedule injections.

## 2016-05-29 DIAGNOSIS — H04123 Dry eye syndrome of bilateral lacrimal glands: Secondary | ICD-10-CM | POA: Diagnosis not present

## 2016-05-29 DIAGNOSIS — M3501 Sicca syndrome with keratoconjunctivitis: Secondary | ICD-10-CM | POA: Diagnosis not present

## 2016-05-30 NOTE — Progress Notes (Signed)
Office Visit Note  Patient: Deanna Norris             Date of Birth: [birth year]           MRN: [id number]             PCP: Abner Greenspan, MD Referring: Tower, [name], MD Visit Date: 06/09/2016 Occupation: _0 @    Subjective:  Medication Management   History of Present Illness: Deanna Norris is a 71 y.o. female  History of rheumatoid arthritis with positive rheumatoid factor, positive CCP, severe erosive disease. Also has history of osteoporosis and OA of bilateral knee joint. Last seen in our office on 02/18/2016  Patient's rheumatoid arthritis is doing really well despite having severe review erosive disease. No joint pain swelling or stiffness. Patient is taking Orencia subcutaneous with good results. She is also taking methotrexate 0.9 mL's per week and folic acid 2 mg daily.  At the last visit on 02/18/2016, we noted the following:==> Patient has a history of osteoarthritis of the knees. She has responded well to Euflex injections back in June 2017 when she completed her third and final injection for both knees. She was 100% better for significant amount of time as a result of the Euflex injections. She states that when she had 2 injections done she was 80% better. She presents today for cortisone injections in both knees because she is traveling to [place] and she wants to address her current pain which she rates as 7 on a scale of 0-10 and the left knee and 6 in the right knee before she goes on her trip next week. She plans to do a lot of walking on uneven pavement for around 7-10 days. I'm agreeable and I'm happy to give her a cortisone injection today. In the meanwhile we will apply for Euflex injections for both knees start that promptly as soon as it is approved.  Patient also has a history of osteoporosis based on DEXA done on 01/16/2016 which showed a T score of -3.4. Because patient has history of reflux, we are unable to use Fosamax Patient is  agreeable to try reclast and I'll order the blood work appropriate to start that medication in the future. If patient cannot tolerate Reclast, we can consider Prolia as a treatment option.   Activities of Daily Living:  Patient reports morning stiffness for 5 minutes.   Patient Denies nocturnal pain.  Difficulty dressing/grooming: Denies Difficulty climbing stairs: Reports Difficulty getting out of chair: Reports Difficulty using hands for taps, buttons, cutlery, and/or writing: Denies   Review of Systems  Constitutional: Negative for fatigue.  HENT: Negative for mouth sores and mouth dryness.   Eyes: Negative for dryness.  Respiratory: Negative for shortness of breath.   Gastrointestinal: Negative for constipation and diarrhea.  Musculoskeletal: Negative for myalgias and myalgias.  Skin: Negative for sensitivity to sunlight.  Psychiatric/Behavioral: Negative for decreased concentration and sleep disturbance.    PMFS History:  Patient Active Problem List   Diagnosis Date Noted  . 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 Primary osteoarthritis of both hands 01/11/2016  . 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/11/2016  . High risk medication use 11/15/2015  . Sjogren's syndrome ([place]) 10/31/2015  . Chronic constipation 10/23/2015  . Rosacea 10/23/2015  . Rheumatoid arthritis ([place]) 03/29/2015  . Coronary artery calcification seen on CT scan 11/17/2013  . Recurrent sinusitis 09/16/2013  . Encounter for Medicare annual wellness exam 01/31/2013  .  Dyspnea 09/14/2012  . Nodule of right lung 09/14/2012  . 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 Hyperlipidemia 05/08/2009  . 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 ASTHMA 05/08/2009  . GERD 05/08/2009  . MENOPAUSAL DISORDER 05/08/2009  . ARTHRITIS, RHEUMATOID 05/08/2009  . Osteoporosis 05/08/2009  . SPONDYLOLISTHESIS 05/08/2009  . Sleep apnea 05/08/2009    Past Medical History:  Diagnosis Date  . Asthma   .  Fibroid   . GERD (gastroesophageal reflux disease)   . Insomnia   . Menopausal symptoms   . Osteoporosis   . Postmenopausal HRT (hormone replacement therapy)   . RA (rheumatoid arthritis) ([place]) 11/2004   methotrexate and Embrel  . Sleep apnea 2008   C-Pap  . Vitamin D deficiency     Family History  Problem Relation Age of Onset  . Breast cancer Sister 53  . Thyroid disease Sister    Past Surgical History:  Procedure Laterality Date  . ABDOMINAL HYSTERECTOMY  01/1993   partial with AP repair, secondary to fibroids  . BREAST SURGERY  1991   breast biopsy  . CYSTOCELE REPAIR  10/2007   with graft  . MELANOMA EXCISION Right 01/2012   excision right posterior arm  . NASAL SINUS SURGERY  07/1991  . VAGINAL DELIVERY     x3   Social History   Social History Narrative  . No narrative on file     Objective: Vital Signs: BP 110/64   Pulse 62   Resp 14   Ht 5' 6" (1.676 m)   Wt 164 lb (74.4 kg)   LMP 01/06/1993   BMI 26.47 kg/m    Physical Exam  Constitutional: She is oriented to person, place, and time. She appears well-developed and well-nourished.  HENT:  Head: Normocephalic and atraumatic.  Eyes: EOM are normal. Pupils are equal, round, and reactive to light.  Cardiovascular: Normal rate, regular rhythm and normal heart sounds.  Exam reveals no gallop and no friction rub.   No murmur heard. Pulmonary/Chest: Effort normal and breath sounds normal. She has no wheezes. She has no rales.  Abdominal: Soft. Bowel sounds are normal. She exhibits no distension. There is no tenderness. There is no guarding. No hernia.  Musculoskeletal: Normal range of motion. She exhibits no edema, tenderness or deformity.  Lymphadenopathy:    She has no cervical adenopathy.  Neurological: She is alert and oriented to person, place, and time. Coordination normal.  Skin: Skin is warm and dry. Capillary refill takes less than 2 seconds. No rash noted.  Psychiatric: She has a normal mood and  affect. Her behavior is normal.  Nursing note and vitals reviewed.    Musculoskeletal Exam:  Full range of motion of all joints Grip strength is equal and strong bilaterally except ulnar deviation Fibromyalgia tender points are absent  CDAI Exam: CDAI Homunculus Exam:   Joint Counts:  CDAI Tender Joint count: 0 CDAI Swollen Joint count: 0  Global Assessments:  Patient Global Assessment: 3 Provider Global Assessment: 3  CDAI Calculated Score: 6    Investigation: No additional findings.  Orders Only on 04/16/2016  Component Date Value Ref Range Status  . Interferon Gamma Release Assay 04/16/2016 NEGATIVE  NEGATIVE Final   Negative test result. M. tuberculosis complex infection unlikely.  . Quantiferon Nil Value 04/16/2016 0.03  IU/mL Final  . Mitogen-Nil 04/16/2016 4.40  IU/mL Final  . Quantiferon Tb Ag Minus Nil Value 04/16/2016 0.03  IU/mL Final   Comment:   The Nil tube value is used to  determine if the patient has a preexisting immune response which could cause a false-positive reading on the test. In order for a test to be valid, the Nil tube must have a value of less than or equal to 8.0 IU/mL.   The mitogen control tube is used to assure the patient has a healthy immune status and also serves as a control for correct blood handling and incubation. It is used to detect false-negative readings. The mitogen tube must have a gamma interferon value of greater than or equal to 0.5 IU/mL higher than the value of the Nil tube.   The TB antigen tube is coated with the M. tuberculosis specific antigens. For a test to be considered positive, the TB antigen tube value minus the Nil tube value must be greater than or equal to 0.35 IU/mL.   For additional information, please refer to http://education.questdiagnostics.com/faq/QFT (This link is being provided for informational/educational purposes only.)   Orders Only on 04/01/2016  Component Date Value Ref Range Status    . WBC 04/01/2016 6.6  3.8 - 10.8 K/uL Final  . RBC 04/01/2016 4.64  3.80 - 5.10 MIL/uL Final  . Hemoglobin 04/01/2016 14.7  11.7 - 15.5 g/dL Final  . HCT 04/01/2016 44.5  35.0 - 45.0 % Final  . MCV 04/01/2016 95.9  80.0 - 100.0 fL Final  . MCH 04/01/2016 31.7  27.0 - 33.0 pg Final  . MCHC 04/01/2016 33.0  32.0 - 36.0 g/dL Final  . RDW 04/01/2016 14.7  11.0 - 15.0 % Final  . Platelets 04/01/2016 224  140 - 400 K/uL Final  . MPV 04/01/2016 11.1  7.5 - 12.5 fL Final  . Neutro Abs 04/01/2016 3432  1,500 - 7,800 cells/uL Final  . Lymphs Abs 04/01/2016 1716  850 - 3,900 cells/uL Final  . Monocytes Absolute 04/01/2016 924  200 - 950 cells/uL Final  . Eosinophils Absolute 04/01/2016 462  15 - 500 cells/uL Final  . Basophils Absolute 04/01/2016 66  0 - 200 cells/uL Final  . Neutrophils Relative % 04/01/2016 52  % Final  . Lymphocytes Relative 04/01/2016 26  % Final  . Monocytes Relative 04/01/2016 14  % Final  . Eosinophils Relative 04/01/2016 7  % Final  . Basophils Relative 04/01/2016 1  % Final  . Smear Review 04/01/2016 Criteria for review not met   Final  . Sodium 04/01/2016 139  135 - 146 mmol/L Final  . Potassium 04/01/2016 4.4  3.5 - 5.3 mmol/L Final  . Chloride 04/01/2016 103  98 - 110 mmol/L Final  . CO2 04/01/2016 22  20 - 31 mmol/L Final  . Glucose, Bld 04/01/2016 95  65 - 99 mg/dL Final  . BUN 04/01/2016 14  7 - 25 mg/dL Final  . Creat 04/01/2016 0.76  0.60 - 0.93 mg/dL Final   Comment:   For patients > or = 71 years of age: The upper reference limit for Creatinine is approximately 13% higher for people identified as African-American.     . Total Bilirubin 04/01/2016 0.3  0.2 - 1.2 mg/dL Final  . Alkaline Phosphatase 04/01/2016 87  33 - 130 U/L Final  . AST 04/01/2016 21  10 - 35 U/L Final  . ALT 04/01/2016 21  6 - 29 U/L Final  . Total Protein 04/01/2016 5.8* 6.1 - 8.1 g/dL Final  . Albumin 04/01/2016 3.5* 3.6 - 5.1 g/dL Final  . Calcium 04/01/2016 9.1  8.6 - 10.4 mg/dL  Final  . GFR, Est African American 04/01/2016 >  89  >=60 mL/min Final  . GFR, Est Non African American 04/01/2016 80  >=60 mL/min Final  . TSH 04/01/2016 1.22  mIU/L Final   Comment:   Reference Range   > or = 20 Years  0.40-4.50   Pregnancy Range First trimester  0.26-2.66 Second trimester 0.55-2.73 Third trimester  0.43-2.91     . Magnesium 04/01/2016 2.2  1.5 - 2.5 mg/dL Final  . Vit D, 25-Hydroxy 04/01/2016 51  30 - 100 ng/mL Final   Comment: Vitamin D Status           25-OH Vitamin D        Deficiency                <20 ng/mL        Insufficiency         20 - 29 ng/mL        Optimal             > or = 30 ng/mL   For 25-OH Vitamin D testing on patients on D2-supplementation and patients for whom quantitation of D2 and D3 fractions is required, the [facility] 25-OH VIT D, (D2,D3), LC/MS/MS is recommended: order code [phone] (patients > 2 yrs).   Marland Kitchen PTH 04/01/2016 36  14 - 64 pg/mL Final  . Calcium 04/01/2016 9.1  8.6 - 10.4 mg/dL Final   Comment:   Interpretive Guide:                              Intact PTH               Calcium                              ----------               ------- Normal Parathyroid           Normal                   Normal Hypoparathyroidism           Low or Low Normal        Low Hyperparathyroidism      Primary                 Normal or High           High      Secondary               High                     Normal or Low      Tertiary                High                     High Non-Parathyroid   Hypercalcemia              Low or Low Normal        High   . Total Protein, Serum Electrophores* 04/01/2016 5.8* 6.1 - 8.1 g/dL Final  . Albumin ELP 04/01/2016 3.4* 3.8 - 4.8 g/dL Final  . Alpha-1-Globulin 04/01/2016 0.3  0.2 - 0.3 g/dL Final  . Alpha-2-Globulin 04/01/2016 0.6  0.5 - 0.9 g/dL Final  . Beta Globulin 04/01/2016 0.4  0.4 - 0.6 g/dL Final  .  Beta 2 04/01/2016 0.3  0.2 - 0.5 g/dL Final  . Gamma Globulin 04/01/2016 0.8  0.8 - 1.7 g/dL  Final  . Abnormal Protein Band1 04/01/2016 NOT DET  g/dL Final  . SPE Interp. 04/01/2016 SEE NOTE   Final   Comment: Evaluation reveals an isolated decrease in albumin. This pattern is suggestive of decreased protein synthesis or protein loss. Consider ordering prealbumin quantitation. Reviewed by Odis Hollingshead, MD, PhD, FCAP (Electronic Signature on File)   . Abnormal Protein Band2 04/01/2016 NOT DET  g/dL Final  . Abnormal Protein Band3 04/01/2016 NOT DET  g/dL Final  Lab on 03/24/2016  Component Date Value Ref Range Status  . WBC 03/24/2016 5.9  4.0 - 10.5 K/uL Final  . RBC 03/24/2016 4.71  3.87 - 5.11 Mil/uL Final  . Hemoglobin 03/24/2016 15.1* 12.0 - 15.0 g/dL Final  . HCT 03/24/2016 44.5  36.0 - 46.0 % Final  . MCV 03/24/2016 94.5  78.0 - 100.0 fl Final  . MCHC 03/24/2016 33.9  30.0 - 36.0 g/dL Final  . RDW 03/24/2016 14.9  11.5 - 15.5 % Final  . Platelets 03/24/2016 272.0  150.0 - 400.0 K/uL Final  . Neutrophils Relative % 03/24/2016 44.2  43.0 - 77.0 % Final  . Lymphocytes Relative 03/24/2016 40.6  12.0 - 46.0 % Final  . Monocytes Relative 03/24/2016 9.4  3.0 - 12.0 % Final  . Eosinophils Relative 03/24/2016 4.4  0.0 - 5.0 % Final  . Basophils Relative 03/24/2016 1.4  0.0 - 3.0 % Final  . Neutro Abs 03/24/2016 2.6  1.4 - 7.7 K/uL Final  . Lymphs Abs 03/24/2016 2.4  0.7 - 4.0 K/uL Final  . Monocytes Absolute 03/24/2016 0.5  0.1 - 1.0 K/uL Final  . Eosinophils Absolute 03/24/2016 0.3  0.0 - 0.7 K/uL Final  . Basophils Absolute 03/24/2016 0.1  0.0 - 0.1 K/uL Final  . Sodium 03/24/2016 141  135 - 145 mEq/L Final  . Potassium 03/24/2016 3.8  3.5 - 5.1 mEq/L Final  . Chloride 03/24/2016 106  96 - 112 mEq/L Final  . CO2 03/24/2016 29  19 - 32 mEq/L Final  . Glucose, Bld 03/24/2016 89  70 - 99 mg/dL Final  . BUN 03/24/2016 12  6 - 23 mg/dL Final  . Creatinine, Ser 03/24/2016 0.61  0.40 - 1.20 mg/dL Final  . Total Bilirubin 03/24/2016 0.5  0.2 - 1.2 mg/dL Final  . Alkaline  Phosphatase 03/24/2016 86  39 - 117 U/L Final  . AST 03/24/2016 24  0 - 37 U/L Final  . ALT 03/24/2016 24  0 - 35 U/L Final  . Total Protein 03/24/2016 6.2  6.0 - 8.3 g/dL Final  . Albumin 03/24/2016 3.7  3.5 - 5.2 g/dL Final  . Calcium 03/24/2016 8.9  8.4 - 10.5 mg/dL Final  . GFR 03/24/2016 102.93  >60.00 mL/min Final  . Cholesterol 03/24/2016 196  0 - 200 mg/dL Final   ATP III Classification       Desirable:  < 200 mg/dL               Borderline High:  200 - 239 mg/dL          High:  > = 240 mg/dL  . Triglycerides 03/24/2016 85.0  0.0 - 149.0 mg/dL Final   Normal:  <150 mg/dLBorderline High:  150 - 199 mg/dL  . HDL 03/24/2016 64.40  >39.00 mg/dL Final  . VLDL 03/24/2016 17.0  0.0 - 40.0 mg/dL Final  .  LDL Cholesterol 03/24/2016 115* 0 - 99 mg/dL Final  . Total CHOL/HDL Ratio 03/24/2016 3   Final                  Men          Women1/2 Average Risk     3.4          3.3Average Risk          5.0          4.42X Average Risk          9.6          7.13X Average Risk          15.0          11.0                      . NonHDL 03/24/2016 131.99   Final   NOTE:  Non-HDL goal should be 30 mg/dL higher than patient's LDL goal (i.e. LDL goal of < 70 mg/dL, would have non-HDL goal of < 100 mg/dL)  . TSH 03/24/2016 2.86  0.35 - 4.50 uIU/mL Final  Office Visit on 01/14/2016  Component Date Value Ref Range Status  . WBC 01/14/2016 7.7  3.8 - 10.8 K/uL Final  . RBC 01/14/2016 4.59  3.80 - 5.10 MIL/uL Final  . Hemoglobin 01/14/2016 14.9  11.7 - 15.5 g/dL Final  . HCT 01/14/2016 44.7  35.0 - 45.0 % Final  . MCV 01/14/2016 97.4  80.0 - 100.0 fL Final  . MCH 01/14/2016 32.5  27.0 - 33.0 pg Final  . MCHC 01/14/2016 33.3  32.0 - 36.0 g/dL Final  . RDW 01/14/2016 14.6  11.0 - 15.0 % Final  . Platelets 01/14/2016 284  140 - 400 K/uL Final  . MPV 01/14/2016 10.6  7.5 - 12.5 fL Final  . Neutro Abs 01/14/2016 3696  1,500 - 7,800 cells/uL Final  . Lymphs Abs 01/14/2016 2772  850 - 3,900 cells/uL Final  .  Monocytes Absolute 01/14/2016 770  200 - 950 cells/uL Final  . Eosinophils Absolute 01/14/2016 385  15 - 500 cells/uL Final  . Basophils Absolute 01/14/2016 77  0 - 200 cells/uL Final  . Neutrophils Relative % 01/14/2016 48  % Final  . Lymphocytes Relative 01/14/2016 36  % Final  . Monocytes Relative 01/14/2016 10  % Final  . Eosinophils Relative 01/14/2016 5  % Final  . Basophils Relative 01/14/2016 1  % Final  . Smear Review 01/14/2016 Criteria for review not met   Final  . Sodium 01/14/2016 140  135 - 146 mmol/L Final  . Potassium 01/14/2016 4.7  3.5 - 5.3 mmol/L Final  . Chloride 01/14/2016 105  98 - 110 mmol/L Final  . CO2 01/14/2016 27  20 - 31 mmol/L Final  . Glucose, Bld 01/14/2016 89  65 - 99 mg/dL Final  . BUN 01/14/2016 12  7 - 25 mg/dL Final  . Creat 01/14/2016 0.58* 0.60 - 0.93 mg/dL Final   Comment:   For patients > or = 71 years of age: The upper reference limit for Creatinine is approximately 13% higher for people identified as African-American.     . Total Bilirubin 01/14/2016 0.4  0.2 - 1.2 mg/dL Final  . Alkaline Phosphatase 01/14/2016 91  33 - 130 U/L Final  . AST 01/14/2016 24  10 - 35 U/L Final  . ALT 01/14/2016 23  6 - 29 U/L Final  . Total Protein 01/14/2016  6.5  6.1 - 8.1 g/dL Final  . Albumin 01/14/2016 3.7  3.6 - 5.1 g/dL Final  . Calcium 01/14/2016 9.2  8.6 - 10.4 mg/dL Final  . GFR, Est African American 01/14/2016 >89  >=60 mL/min Final  . GFR, Est Non African American 01/14/2016 >89  >=60 mL/min Final     Imaging: No results found.  Speciality Comments: No specialty comments available.    Procedures:  No procedures performed Allergies: Adalimumab; Diclofenac; Dicyclomine; Doxycycline hyclate; Fosamax [alendronate sodium]; Methotrexate derivatives; Nickel; Piroxicam; Salagen [pilocarpine hcl]; Sulfamethoxazole-trimethoprim; Tramadol hcl; and Plaquenil [hydroxychloroquine]   Assessment / Plan:     Visit Diagnoses: Rheumatoid arthritis  involving multiple sites with positive rheumatoid factor ([place])  High risk medication use - 06/09/2016: MTX 0.9 mL every week; Orencia subcutaneous;(adequate response)  Primary osteoarthritis of both knees  Sjogren's syndrome, with unspecified organ involvement ([place])  Primary osteoarthritis of both hands  Age-related osteoporosis without current pathological fracture   Plan: #1: History of rheumatoid arthritis. Positive rheumatoid factor, positive CCP, history of severe erosive disease; doing well with methotrexate 0.9 ML's every week and Orencia subcutaneous every week (adequate response); no flare. Today, patient reports that she is doing well with her rheumatoid arthritis. In fact, patient would like to see if she can reduce the dose of her methotrexate and Orencia.  #2:  Hx osteoporosis ,  We did the following lab work at last visit. CBC with differential, CMP with GFR,Vitamin D, TSH, SPEP, Mg, PTH, on March 2018 Those labs are all within normal limits. She would like to start her Reclast infusion as soon as possible. Message sent to [facility] and Seth Bake to schedule Reclast as soon as lab work is done and up-to-date Okay to start Reclast next week if all labs come back normal. Handout and consent on Reclast if needed.   #3: High-risk prescription Methotrexate 0.9 ML per week Folic acid 2 mg per day Orencia subcutaneous CBC with differential and CMP with GFR are all within normal limits; TB gold is negative April 2018  [facility] can be spaced from every week to every 10 days (if pt wants --> but today, some mcp are boggy pre dr. Buckner [place] examination today) MTX 0.50m can be spaced to  0.855m #4: We ordered Euflexxa 3 for bilateral knees at the last visit. Today, Patient states that she is scheduled for Euflexxa injection starting in August.  Patient was 80% better after 2 injections of Euflexxa on 06/22/2015 visit note.  #5: Return to clinic in 3 months  Orders: No  orders of the defined types were placed in this encounter.  No orders of the defined types were placed in this encounter.   Face-to-face time spent with patient was 30 minutes. 50% of time was spent in counseling and coordination of care.  Follow-Up Instructions: No Follow-up on file.   NaEliezer LoftsPA-C  Note - This record has been created using [employer] Chart creation errors have been sought, but may not always  have been located. Such creation errors do not reflect on  the standard of medical care.

## 2016-05-30 NOTE — Assessment & Plan Note (Signed)
06/09/2016: Methotrexate 0.9 mL; Orencia subcutaneous (adequate response)

## 2016-06-09 ENCOUNTER — Encounter: Payer: Self-pay | Admitting: Rheumatology

## 2016-06-09 ENCOUNTER — Ambulatory Visit (INDEPENDENT_AMBULATORY_CARE_PROVIDER_SITE_OTHER): Payer: Medicare Other | Admitting: Rheumatology

## 2016-06-09 VITALS — BP 110/64 | HR 62 | Resp 14 | Ht 66.0 in | Wt 164.0 lb

## 2016-06-09 DIAGNOSIS — M3500 Sicca syndrome, unspecified: Secondary | ICD-10-CM | POA: Diagnosis not present

## 2016-06-09 DIAGNOSIS — M19041 Primary osteoarthritis, right hand: Secondary | ICD-10-CM | POA: Diagnosis not present

## 2016-06-09 DIAGNOSIS — I2510 Atherosclerotic heart disease of native coronary artery without angina pectoris: Secondary | ICD-10-CM | POA: Diagnosis not present

## 2016-06-09 DIAGNOSIS — M19042 Primary osteoarthritis, left hand: Secondary | ICD-10-CM | POA: Diagnosis not present

## 2016-06-09 DIAGNOSIS — M170 Bilateral primary osteoarthritis of knee: Secondary | ICD-10-CM

## 2016-06-09 DIAGNOSIS — M810 Age-related osteoporosis without current pathological fracture: Secondary | ICD-10-CM | POA: Diagnosis not present

## 2016-06-09 DIAGNOSIS — Z79899 Other long term (current) drug therapy: Secondary | ICD-10-CM | POA: Diagnosis not present

## 2016-06-09 DIAGNOSIS — M0579 Rheumatoid arthritis with rheumatoid factor of multiple sites without organ or systems involvement: Secondary | ICD-10-CM

## 2016-06-09 LAB — CBC WITH DIFFERENTIAL/PLATELET
BASOS PCT: 1 %
Basophils Absolute: 69 cells/uL (ref 0–200)
EOS PCT: 6 %
Eosinophils Absolute: 414 cells/uL (ref 15–500)
HCT: 45 % (ref 35.0–45.0)
Hemoglobin: 15.1 g/dL (ref 11.7–15.5)
LYMPHS PCT: 33 %
Lymphs Abs: 2277 cells/uL (ref 850–3900)
MCH: 32 pg (ref 27.0–33.0)
MCHC: 33.6 g/dL (ref 32.0–36.0)
MCV: 95.3 fL (ref 80.0–100.0)
MONOS PCT: 9 %
MPV: 10.6 fL (ref 7.5–12.5)
Monocytes Absolute: 621 cells/uL (ref 200–950)
Neutro Abs: 3519 cells/uL (ref 1500–7800)
Neutrophils Relative %: 51 %
PLATELETS: 237 10*3/uL (ref 140–400)
RBC: 4.72 MIL/uL (ref 3.80–5.10)
RDW: 15.5 % — AB (ref 11.0–15.0)
WBC: 6.9 10*3/uL (ref 3.8–10.8)

## 2016-06-09 MED ORDER — METHOTREXATE SODIUM CHEMO INJECTION 50 MG/2ML: 22.5000 mg | INTRAMUSCULAR | 0 refills | Status: DC

## 2016-06-09 MED ORDER — ORENCIA 125 MG/ML ~~LOC~~ SOSY: PREFILLED_SYRINGE | SUBCUTANEOUS | 0 refills | Status: DC

## 2016-06-09 NOTE — Patient Instructions (Addendum)
Zoledronic Acid injection (Paget's Disease, Osteoporosis) What is this medicine? ZOLEDRONIC ACID (ZOE le dron ik AS id) lowers the amount of calcium loss from bone. It is used to treat Paget's disease and osteoporosis in women. This medicine may be used for other purposes; ask your health care provider or pharmacist if you have questions. COMMON BRAND NAME(S): Reclast, Zometa What should I tell my health care provider before I take this medicine? They need to know if you have any of these conditions: -aspirin-sensitive asthma -cancer, especially if you are receiving medicines used to treat cancer -dental disease or wear dentures -infection -kidney disease -low levels of calcium in the blood -past surgery on the parathyroid gland or intestines -receiving corticosteroids like dexamethasone or prednisone -an unusual or allergic reaction to zoledronic acid, other medicines, foods, dyes, or preservatives -pregnant or trying to get pregnant -breast-feeding How should I use this medicine? This medicine is for infusion into a vein. It is given by a health care professional in a hospital or clinic setting. Talk to your pediatrician regarding the use of this medicine in children. This medicine is not approved for use in children. Overdosage: If you think you have taken too much of this medicine contact a poison control center or emergency room at once. NOTE: This medicine is only for you. Do not share this medicine with others. What if I miss a dose? It is important not to miss your dose. Call your doctor or health care professional if you are unable to keep an appointment. What may interact with this medicine? -certain antibiotics given by injection -NSAIDs, medicines for pain and inflammation, like ibuprofen or naproxen -some diuretics like bumetanide, furosemide -teriparatide This list may not describe all possible interactions. Give your health care provider a list of all the medicines,  herbs, non-prescription drugs, or dietary supplements you use. Also tell them if you smoke, drink alcohol, or use illegal drugs. Some items may interact with your medicine. What should I watch for while using this medicine? Visit your doctor or health care professional for regular checkups. It may be some time before you see the benefit from this medicine. Do not stop taking your medicine unless your doctor tells you to. Your doctor may order blood tests or other tests to see how you are doing. Women should inform their doctor if they wish to become pregnant or think they might be pregnant. There is a potential for serious side effects to an unborn child. Talk to your health care professional or pharmacist for more information. You should make sure that you get enough calcium and vitamin D while you are taking this medicine. Discuss the foods you eat and the vitamins you take with your health care professional. Some people who take this medicine have severe bone, joint, and/or muscle pain. This medicine may also increase your risk for jaw problems or a broken thigh bone. Tell your doctor right away if you have severe pain in your jaw, bones, joints, or muscles. Tell your doctor if you have any pain that does not go away or that gets worse. Tell your dentist and dental surgeon that you are taking this medicine. You should not have major dental surgery while on this medicine. See your dentist to have a dental exam and fix any dental problems before starting this medicine. Take good care of your teeth while on this medicine. Make sure you see your dentist for regular follow-up appointments. What side effects may I notice from receiving this medicine?  Side effects that you should report to your doctor or health care professional as soon as possible: -allergic reactions like skin rash, itching or hives, swelling of the face, lips, or tongue -anxiety, confusion, or depression -breathing problems -changes in  vision -eye pain -feeling faint or lightheaded, falls -jaw pain, especially after dental work -mouth sores -muscle cramps, stiffness, or weakness -redness, blistering, peeling or loosening of the skin, including inside the mouth -trouble passing urine or change in the amount of urine Side effects that usually do not require medical attention (report to your doctor or health care professional if they continue or are bothersome): -bone, joint, or muscle pain -constipation -diarrhea -fever -hair loss -irritation at site where injected -loss of appetite -nausea, vomiting -stomach upset -trouble sleeping -trouble swallowing -weak or tired This list may not describe all possible side effects. Call your doctor for medical advice about side effects. You may report side effects to FDA at 1-800-FDA-1088. Where should I keep my medicine? This drug is given in a hospital or clinic and will not be stored at home. NOTE: This sheet is a summary. It may not cover all possible information. If you have questions about this medicine, talk to your doctor, pharmacist, or health care provider.  2018 Elsevier/Gold Standard (2013-05-21 14:19:57)  Knee Exercises Ask your health care provider which exercises are safe for you. Do exercises exactly as told by your health care provider and adjust them as directed. It is normal to feel mild stretching, pulling, tightness, or discomfort as you do these exercises, but you should stop right away if you feel sudden pain or your pain gets worse.Do not begin these exercises until told by your health care provider. STRETCHING AND RANGE OF MOTION EXERCISES These exercises warm up your muscles and joints and improve the movement and flexibility of your knee. These exercises also help to relieve pain, numbness, and tingling. Exercise A: Knee Extension, Prone 1. Lie on your abdomen on a bed. 2. Place your left / right knee just beyond the edge of the surface so your knee  is not on the bed. You can put a towel under your left / right thigh just above your knee for comfort. 3. Relax your leg muscles and allow gravity to straighten your knee. You should feel a stretch behind your left / right knee. 4. Hold this position for __________ seconds. 5. Scoot up so your knee is supported between repetitions. Repeat __________ times. Complete this stretch __________ times a day. Exercise B: Knee Flexion, Active  1. Lie on your back with both knees straight. If this causes back discomfort, bend your left / right knee so your foot is flat on the floor. 2. Slowly slide your left / right heel back toward your buttocks until you feel a gentle stretch in the front of your knee or thigh. 3. Hold this position for __________ seconds. 4. Slowly slide your left / right heel back to the starting position. Repeat __________ times. Complete this exercise __________ times a day. Exercise C: Quadriceps, Prone  1. Lie on your abdomen on a firm surface, such as a bed or padded floor. 2. Bend your left / right knee and hold your ankle. If you cannot reach your ankle or pant leg, loop a belt around your foot and grab the belt instead. 3. Gently pull your heel toward your buttocks. Your knee should not slide out to the side. You should feel a stretch in the front of your thigh and knee.  4. Hold this position for __________ seconds. Repeat __________ times. Complete this stretch __________ times a day. Exercise D: Hamstring, Supine 1. Lie on your back. 2. Loop a belt or towel over the ball of your left / right foot. The ball of your foot is on the walking surface, right under your toes. 3. Straighten your left / right knee and slowly pull on the belt to raise your leg until you feel a gentle stretch behind your knee. ? Do not let your left / right knee bend while you do this. ? Keep your other leg flat on the floor. 4. Hold this position for __________ seconds. Repeat __________ times.  Complete this stretch __________ times a day. STRENGTHENING EXERCISES These exercises build strength and endurance in your knee. Endurance is the ability to use your muscles for a long time, even after they get tired. Exercise E: Quadriceps, Isometric  1. Lie on your back with your left / right leg extended and your other knee bent. Put a rolled towel or small pillow under your knee if told by your health care provider. 2. Slowly tense the muscles in the front of your left / right thigh. You should see your kneecap slide up toward your hip or see increased dimpling just above the knee. This motion will push the back of the knee toward the floor. 3. For __________ seconds, keep the muscle as tight as you can without increasing your pain. 4. Relax the muscles slowly and completely. Repeat __________ times. Complete this exercise __________ times a day. Exercise F: Straight Leg Raises - Quadriceps 1. Lie on your back with your left / right leg extended and your other knee bent. 2. Tense the muscles in the front of your left / right thigh. You should see your kneecap slide up or see increased dimpling just above the knee. Your thigh may even shake a bit. 3. Keep these muscles tight as you raise your leg 4-6 inches (10-15 cm) off the floor. Do not let your knee bend. 4. Hold this position for __________ seconds. 5. Keep these muscles tense as you lower your leg. 6. Relax your muscles slowly and completely after each repetition. Repeat __________ times. Complete this exercise __________ times a day. Exercise G: Hamstring, Isometric 1. Lie on your back on a firm surface. 2. Bend your left / right knee approximately __________ degrees. 3. Dig your left / right heel into the surface as if you are trying to pull it toward your buttocks. Tighten the muscles in the back of your thighs to dig as hard as you can without increasing any pain. 4. Hold this position for __________ seconds. 5. Release the  tension gradually and allow your muscles to relax completely for __________ seconds after each repetition. Repeat __________ times. Complete this exercise __________ times a day. Exercise H: Hamstring Curls  If told by your health care provider, do this exercise while wearing ankle weights. Begin with __________ weights. Then increase the weight by 1 lb (0.5 kg) increments. Do not wear ankle weights that are more than __________. 1. Lie on your abdomen with your legs straight. 2. Bend your left / right knee as far as you can without feeling pain. Keep your hips flat against the floor. 3. Hold this position for __________ seconds. 4. Slowly lower your leg to the starting position.  Repeat __________ times. Complete this exercise __________ times a day. Exercise I: Squats (Quadriceps) 1. Stand in front of a table, with your feet  and knees pointing straight ahead. You may rest your hands on the table for balance but not for support. 2. Slowly bend your knees and lower your hips like you are going to sit in a chair. ? Keep your weight over your heels, not over your toes. ? Keep your lower legs upright so they are parallel with the table legs. ? Do not let your hips go lower than your knees. ? Do not bend lower than told by your health care provider. ? If your knee pain increases, do not bend as low. 3. Hold the squat position for __________ seconds. 4. Slowly push with your legs to return to standing. Do not use your hands to pull yourself to standing. Repeat __________ times. Complete this exercise __________ times a day. Exercise J: Wall Slides (Quadriceps)  1. Lean your back against a smooth wall or door while you walk your feet out 18-24 inches (46-61 cm) from it. 2. Place your feet hip-width apart. 3. Slowly slide down the wall or door until your knees bend __________ degrees. Keep your knees over your heels, not over your toes. Keep your knees in line with your hips. 4. Hold for  __________ seconds. Repeat __________ times. Complete this exercise __________ times a day. Exercise K: Straight Leg Raises - Hip Abductors 1. Lie on your side with your left / right leg in the top position. Lie so your head, shoulder, knee, and hip line up. You may bend your bottom knee to help you keep your balance. 2. Roll your hips slightly forward so your hips are stacked directly over each other and your left / right knee is facing forward. 3. Leading with your heel, lift your top leg 4-6 inches (10-15 cm). You should feel the muscles in your outer hip lifting. ? Do not let your foot drift forward. ? Do not let your knee roll toward the ceiling. 4. Hold this position for __________ seconds. 5. Slowly return your leg to the starting position. 6. Let your muscles relax completely after each repetition. Repeat __________ times. Complete this exercise __________ times a day. Exercise L: Straight Leg Raises - Hip Extensors 1. Lie on your abdomen on a firm surface. You can put a pillow under your hips if that is more comfortable. 2. Tense the muscles in your buttocks and lift your left / right leg about 4-6 inches (10-15 cm). Keep your knee straight as you lift your leg. 3. Hold this position for __________ seconds. 4. Slowly lower your leg to the starting position. 5. Let your leg relax completely after each repetition. Repeat __________ times. Complete this exercise __________ times a day. This information is not intended to replace advice given to you by your health care provider. Make sure you discuss any questions you have with your health care provider. Document Released: 11/06/2004 Document Revised: 09/17/2015 Document Reviewed: 10/29/2014 Elsevier Interactive Patient Education  2018 [employer].

## 2016-06-09 NOTE — Progress Notes (Signed)
Pharmacy Note  Subjective: Patient presents today to the [facility] to see Mr. Panwala/Dr. Estanislado Pandy.  Patient seen by pharmacist for counseling on bisphosphonate therapy.  Decision was made to start IV bisphosphonate due to patient's reflux.    Objective: T-score: -3.2  Vitamin D: 51 ng/mL (04/01/16)  CMP     Component Value Date/Time   NA 139 04/01/2016 1401   K 4.4 04/01/2016 1401   CL 103 04/01/2016 1401   CO2 22 04/01/2016 1401   GLUCOSE 95 04/01/2016 1401   BUN 14 04/01/2016 1401   CREATININE 0.76 04/01/2016 1401   CALCIUM 9.1 04/01/2016 1401   CALCIUM 9.1 04/01/2016 1401   PROT 5.8 (L) 04/01/2016 1401   ALBUMIN 3.5 (L) 04/01/2016 1401   AST 21 04/01/2016 1401   ALT 21 04/01/2016 1401   ALKPHOS 87 04/01/2016 1401   BILITOT 0.3 04/01/2016 1401   GFRNONAA 80 04/01/2016 1401   GFRAA >89 04/01/2016 1401   Assessment/Plan: Counseled patient that Reclast is an IV bisphosphonate that reduces bone turnover by inhibiting osteoclasts that chew up bone.  Counseled patient on purpose, proper use, and adverse effects of Reclast.  Reviewed importance of taking calcium and vitamin D with bisphosphonate therapy.  Patient confirms she is already taking calcium/vitamin D.  Provided patient with medication education material and answered all questions.  Reviewed adverse events of Reclast including risk of nausea & diarrhea, headache, and muscle & bone pain.  Reviewed rare adverse effect of osteonecrosis of the jaw and advised patient to alert her dentist that she is on Reclast prior to any major dental work.  Patient confirms she does not have any major dental work scheduled at this time.  Patient agrees to trial of Reclast at this time.     [name], Pharm.D., BCPS Clinical Pharmacist Pager: [phone] Phone: [phone] 06/09/2016 9:49 AM

## 2016-06-10 ENCOUNTER — Telehealth: Payer: Self-pay | Admitting: Pharmacist

## 2016-06-10 LAB — COMPLETE METABOLIC PANEL WITH GFR
ALT: 23 U/L (ref 6–29)
AST: 26 U/L (ref 10–35)
Albumin: 3.6 g/dL (ref 3.6–5.1)
Alkaline Phosphatase: 93 U/L (ref 33–130)
BILIRUBIN TOTAL: 0.5 mg/dL (ref 0.2–1.2)
BUN: 14 mg/dL (ref 7–25)
CHLORIDE: 109 mmol/L (ref 98–110)
CO2: 22 mmol/L (ref 20–31)
Calcium: 9 mg/dL (ref 8.6–10.4)
Creat: 0.7 mg/dL (ref 0.60–0.93)
GFR, EST NON AFRICAN AMERICAN: 88 mL/min (ref >=60)
GFR, Est African American: >89 mL/min (ref >=60)
GLUCOSE: 78 mg/dL (ref 65–99)
Potassium: 4.4 mmol/L (ref 3.5–5.3)
SODIUM: 143 mmol/L (ref 135–146)
TOTAL PROTEIN: 6 g/dL — AB (ref 6.1–8.1)

## 2016-06-10 NOTE — Telephone Encounter (Signed)
Error

## 2016-06-11 DIAGNOSIS — H5203 Hypermetropia, bilateral: Secondary | ICD-10-CM | POA: Diagnosis not present

## 2016-06-11 DIAGNOSIS — H25813 Combined forms of age-related cataract, bilateral: Secondary | ICD-10-CM | POA: Diagnosis not present

## 2016-06-11 DIAGNOSIS — H04123 Dry eye syndrome of bilateral lacrimal glands: Secondary | ICD-10-CM | POA: Diagnosis not present

## 2016-06-11 DIAGNOSIS — H35363 Drusen (degenerative) of macula, bilateral: Secondary | ICD-10-CM | POA: Diagnosis not present

## 2016-06-11 DIAGNOSIS — H52223 Regular astigmatism, bilateral: Secondary | ICD-10-CM | POA: Diagnosis not present

## 2016-06-13 ENCOUNTER — Other Ambulatory Visit: Payer: Self-pay | Admitting: Radiology

## 2016-06-13 ENCOUNTER — Telehealth: Payer: Self-pay | Admitting: Radiology

## 2016-06-13 DIAGNOSIS — M810 Age-related osteoporosis without current pathological fracture: Secondary | ICD-10-CM

## 2016-06-13 NOTE — Telephone Encounter (Signed)
I have called patient to advise labs are normal  

## 2016-06-13 NOTE — Progress Notes (Unsigned)
Orders sent for Reclast per plan / Mr Deanna Norris  Patient in a rush to get this done she states insurance will cover and she will call now to schedule.   Can we discuss ?

## 2016-06-16 NOTE — Progress Notes (Unsigned)
Discussed with Amy who was concerned about coverage of Reclast.  Benefits investigation was completed by Chasta on 03/14/16, "Spoke with Bonnita Nasuti from [facility] who states that Ms. Alley Neils is her secondary coverage. With medicare being her primary, they will cover 80% of the cost. [employer] will pick up the remaining 20%. No authorization is required for her Reclast infusion."

## 2016-06-19 ENCOUNTER — Ambulatory Visit (HOSPITAL_COMMUNITY)
Admission: RE | Admit: 2016-06-19 | Discharge: 2016-06-19 | Disposition: A | Discharge status: Home / Self Care | Payer: Medicare Other | Source: Ambulatory Visit | Attending: Rheumatology | Admitting: Rheumatology

## 2016-06-19 DIAGNOSIS — M810 Age-related osteoporosis without current pathological fracture: Secondary | ICD-10-CM

## 2016-06-19 MED ORDER — ZOLEDRONIC ACID 5 MG/100ML IV SOLN
INTRAVENOUS | Status: AC
  Filled 2016-06-19: qty 100

## 2016-06-19 MED ORDER — ZOLEDRONIC ACID 5 MG/100ML IV SOLN
5.0000 mg | Freq: Once | INTRAVENOUS | Status: AC
  Administered 2016-06-19: 5 mg via INTRAVENOUS

## 2016-06-19 NOTE — Discharge Instructions (Signed)

## 2016-07-24 ENCOUNTER — Encounter: Payer: Self-pay | Admitting: Internal Medicine

## 2016-07-24 ENCOUNTER — Ambulatory Visit (INDEPENDENT_AMBULATORY_CARE_PROVIDER_SITE_OTHER): Payer: Medicare Other | Admitting: Internal Medicine

## 2016-07-24 VITALS — BP 104/72 | HR 64 | Temp 97.6°F | Wt 163.0 lb

## 2016-07-24 DIAGNOSIS — J329 Chronic sinusitis, unspecified: Secondary | ICD-10-CM

## 2016-07-24 DIAGNOSIS — B9789 Other viral agents as the cause of diseases classified elsewhere: Secondary | ICD-10-CM | POA: Diagnosis not present

## 2016-07-24 DIAGNOSIS — I2510 Atherosclerotic heart disease of native coronary artery without angina pectoris: Secondary | ICD-10-CM

## 2016-07-24 MED ORDER — AMOXICILLIN-POT CLAVULANATE 875-125 MG PO TABS: 1.0000 | ORAL_TABLET | Freq: Two times a day (BID) | ORAL | 0 refills | Status: DC

## 2016-07-24 NOTE — Patient Instructions (Signed)

## 2016-07-24 NOTE — Progress Notes (Signed)
HPI  Pt presents to the clinic today with c/o headache, facial pain and pressure, nasal congestion, and ear fullness. This started 2-3 days ago. She is blowing yellow mucous out of her nose .She denies ear pain or decreased hearing. She denies sore throat or cough. She denies fever, chills or body aches. She has tried Engineer, site without any relief. She has not had sick contacts that she is aware of. She does have a history of asthma. She does not smoke.   Review of Systems     Past Medical History:  Diagnosis Date  . Asthma   . Fibroid   . GERD (gastroesophageal reflux disease)   . Insomnia   . Menopausal symptoms   . Osteoporosis   . Postmenopausal HRT (hormone replacement therapy)   . RA (rheumatoid arthritis) ([place]) 11/2004   methotrexate and Embrel  . Sleep apnea 2008   C-Pap  . Vitamin D deficiency     Family History  Problem Relation Age of Onset  . Breast cancer Sister 33  . Thyroid disease Sister     Social History   Social History  . Marital status: Married    Spouse name: N/A  . Number of children: N/A  . 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 Smoking status: Never Smoker  . Smokeless tobacco: Never Used  . Alcohol use No  . Drug use: No  . Sexual activity: Yes    Birth control/ protection: Surgical     Comment: TAH   Other Topics Concern  . Not on file   Social History Narrative  . No narrative on file    Allergies  Allergen Reactions  . Adalimumab     REACTION: frequent sinus infection  . Diclofenac     unk  . Dicyclomine     Turn teeth gray  . Doxycycline Hyclate Other (See Comments)    "turns teeth gray"  . Fosamax [Alendronate Sodium]     Acid reflux  . Methotrexate Derivatives Nausea Only    Oral metotrexate  . Nickel   . Piroxicam     unk  . Salagen [Pilocarpine Hcl] Other (See Comments)    Excessive sweating, headaches  . Sulfamethoxazole-Trimethoprim      REACTION: face and hands rash and felt burning sensation  . Tramadol Hcl     REACTION: nausea  . Plaquenil [Hydroxychloroquine] Rash     Constitutional: Positive headache. Denies fatigue, fever or abrupt weight changes.  HEENT:  Positive facial pain, nasal congestion. Denies eye redness, ear pain, ringing in the ears, wax buildup, runny nose or sore throat. Respiratory: Denies cough, difficulty breathing or shortness of breath.  Cardiovascular: Denies chest pain, chest tightness, palpitations or swelling in the hands or feet.   No other specific complaints in a complete review of systems (except as listed in HPI above).  Objective:   BP 104/72   Pulse 64   Temp 97.6 F (36.4 C) (Oral)   Wt 163 lb (73.9 kg)   LMP 01/06/1993   SpO2 97%   BMI 26.31 kg/m   General: Appears her stated age, well developed, well nourished in NAD. HEENT: Head: normal shape and size, maxillary sinus tenderness noted;Ears: Tm's gray and intact, mildly distorted light reflex on the left; Nose: mucosa boggy and moist, turbinates swollen; Throat/Mouth: + PND. Teeth present, mucosa erythematous and moist, no exudate noted, no lesions or ulcerations noted.  Neck:  No adenopathy noted.  Pulmonary/Chest: Normal effort and positive vesicular breath sounds. No respiratory distress. No wheezes, rales or ronchi noted.       Assessment & Plan:   Viral sinusitis  Can use a Neti Pot which can be purchased from your local drug store. Flonase 2 sprays each nostril for 3 days and then as needed. Start Zyrtec OTC Printed Rx for Augmentin BID for 10 days, to take while out of town if remarkably worse. (Advised her to call rheum to see if they want her to hold her MTX/Orencia while she is on abx)  RTC as needed or if symptoms persist. Webb Silversmith, NP

## 2016-07-28 ENCOUNTER — Telehealth: Payer: Self-pay | Admitting: Obstetrics and Gynecology

## 2016-07-28 NOTE — Telephone Encounter (Signed)
Left message regarding upcoming appointment has been canceled and needs to be rescheduled. °

## 2016-07-30 ENCOUNTER — Ambulatory Visit: Payer: Medicare Other | Admitting: Rheumatology

## 2016-08-06 ENCOUNTER — Ambulatory Visit (INDEPENDENT_AMBULATORY_CARE_PROVIDER_SITE_OTHER): Payer: Medicare Other | Admitting: Rheumatology

## 2016-08-06 DIAGNOSIS — M170 Bilateral primary osteoarthritis of knee: Secondary | ICD-10-CM | POA: Diagnosis not present

## 2016-08-06 MED ORDER — SODIUM HYALURONATE (VISCOSUP) 20 MG/2ML IX SOSY
20.0000 mg | PREFILLED_SYRINGE | INTRA_ARTICULAR | Status: AC | PRN
  Administered 2016-08-06: 20 mg via INTRA_ARTICULAR

## 2016-08-06 MED ORDER — LIDOCAINE HCL 2 % IJ SOLN
1.5000 mL | INTRAMUSCULAR | Status: AC | PRN
  Administered 2016-08-06: 1.5 mL

## 2016-08-06 NOTE — Patient Instructions (Signed)

## 2016-08-06 NOTE — Progress Notes (Signed)
   Procedure Note  Patient: Deanna Norris             Date of Birth: June 14, 1945           MRN: [id number]             Visit Date: 08/06/2016  Procedures: Visit Diagnoses: Primary osteoarthritis of both knees - Plan: Large Joint Injection/Arthrocentesis, Large Joint Injection/Arthrocentesis Bilateral knees Euflexxa #1  Large Joint Inj Date/Time: 08/06/2016 9:46 AM Performed by: Bo Merino Authorized by: Bo Merino   Consent Given by:  Patient Site marked: the procedure site was marked   Timeout: prior to procedure the correct patient, procedure, and site was verified   Indications:  Pain Location:  Knee Site:  R knee Prep: patient was prepped and draped in usual sterile fashion   Needle Size:  27 G Needle Length:  1.5 inches Ultrasound Guidance: No   Fluoroscopic Guidance: No   Arthrogram: No   Medications:  1.5 mL lidocaine 2 %; 20 mg Sodium Hyaluronate 20 MG/2ML Aspiration Attempted: Yes   Patient tolerance:  Patient tolerated the procedure well with no immediate complications Large Joint Inj Date/Time: 08/06/2016 9:47 AM Performed by: Bo Merino Authorized by: Bo Merino   Consent Given by:  Patient Site marked: the procedure site was marked   Timeout: prior to procedure the correct patient, procedure, and site was verified   Indications:  Pain Location:  Knee Site:  L knee Prep: patient was prepped and draped in usual sterile fashion   Needle Size:  27 G Needle Length:  1.5 inches Ultrasound Guidance: No   Fluoroscopic Guidance: No   Arthrogram: No   Medications:  1.5 mL lidocaine 2 %; 20 mg Sodium Hyaluronate 20 MG/2ML Aspiration Attempted: Yes   Patient tolerance:  Patient tolerated the procedure well with no immediate complications  Bo Merino, MD

## 2016-08-07 NOTE — Progress Notes (Signed)
   Procedure Note  Patient: Deanna Norris             Date of Birth: 02/15/1945           MRN: [id number]             Visit Date: 08/13/2016  Procedures: Visit Diagnoses: No diagnosis found.  Euflexxa #2 bilateral knees  Large Joint Inj Date/Time: 08/13/2016 10:11 AM Performed by: Bo Merino Authorized by: Bo Merino   Consent Given by:  Patient Site marked: the procedure site was marked   Timeout: prior to procedure the correct patient, procedure, and site was verified   Indications:  Pain and joint swelling Location:  Knee Site:  R knee Prep: patient was prepped and draped in usual sterile fashion   Needle Size:  27 G Needle Length:  1.5 inches Approach:  Medial Ultrasound Guidance: No   Fluoroscopic Guidance: No   Arthrogram: No   Medications:  1.5 mL lidocaine 1 %; 20 mg Sodium Hyaluronate 20 MG/2ML Aspiration Attempted: Yes   Aspirate amount (mL):  0 Patient tolerance:  Patient tolerated the procedure well with no immediate complications Large Joint Inj Date/Time: 08/13/2016 10:11 AM Performed by: Bo Merino Authorized by: Bo Merino   Consent Given by:  Patient Site marked: the procedure site was marked   Timeout: prior to procedure the correct patient, procedure, and site was verified   Indications:  Pain and joint swelling Location:  Knee Site:  L knee Prep: patient was prepped and draped in usual sterile fashion   Needle Size:  27 G Needle Length:  1.5 inches Approach:  Medial Ultrasound Guidance: No   Fluoroscopic Guidance: No   Arthrogram: No   Medications:  1.5 mL lidocaine 1 %; 20 mg Sodium Hyaluronate 20 MG/2ML Aspiration Attempted: Yes   Aspirate amount (mL):  0 Patient tolerance:  Patient tolerated the procedure well with no immediate complications   Bo Merino, MD

## 2016-08-13 ENCOUNTER — Ambulatory Visit: Payer: Medicare Other | Admitting: Rheumatology

## 2016-08-13 ENCOUNTER — Encounter: Payer: Self-pay | Admitting: Rheumatology

## 2016-08-13 ENCOUNTER — Ambulatory Visit (INDEPENDENT_AMBULATORY_CARE_PROVIDER_SITE_OTHER): Payer: Medicare Other | Admitting: Rheumatology

## 2016-08-13 DIAGNOSIS — M170 Bilateral primary osteoarthritis of knee: Secondary | ICD-10-CM

## 2016-08-13 MED ORDER — LIDOCAINE HCL 1 % IJ SOLN
1.5000 mL | INTRAMUSCULAR | Status: AC | PRN
  Administered 2016-08-13: 1.5 mL

## 2016-08-13 MED ORDER — SODIUM HYALURONATE (VISCOSUP) 20 MG/2ML IX SOSY
20.0000 mg | PREFILLED_SYRINGE | INTRA_ARTICULAR | Status: AC | PRN
  Administered 2016-08-13: 20 mg via INTRA_ARTICULAR

## 2016-08-13 NOTE — Progress Notes (Signed)
   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 #3  Large Joint Inj Date/Time: 08/20/2016 8:40 AM Performed by: Bo Merino Authorized by: Bo Merino   Consent Given by:  Patient Site marked: the procedure site was marked   Timeout: prior to procedure the correct patient, procedure, and site was verified   Indications:  Pain and joint swelling Location:  Knee Site:  R knee Prep: patient was prepped and draped in usual sterile fashion   Needle Size:  27 G Needle Length:  1.5 inches Approach:  Medial Ultrasound Guidance: No   Fluoroscopic Guidance: No   Arthrogram: No   Medications:  1.5 mL lidocaine 1 %; 20 mg Sodium Hyaluronate 20 MG/2ML Aspiration Attempted: Yes   Aspirate amount (mL):  0 Patient tolerance:  Patient tolerated the procedure well with no immediate complications Large Joint Inj Date/Time: 08/20/2016 8:40 AM Performed by: Bo Merino Authorized by: Bo Merino   Consent Given by:  Patient Site marked: the procedure site was marked   Timeout: prior to procedure the correct patient, procedure, and site was verified   Indications:  Pain and joint swelling Location:  Knee Site:  L knee Prep: patient was prepped and draped in usual sterile fashion   Needle Size:  27 G Needle Length:  1.5 inches Approach:  Medial Ultrasound Guidance: No   Fluoroscopic Guidance: No   Arthrogram: No   Medications:  1.5 mL lidocaine 1 %; 20 mg Sodium Hyaluronate 20 MG/2ML Aspiration Attempted: Yes   Aspirate amount (mL):  0 Patient tolerance:  Patient tolerated the procedure well with no immediate complications   Bo Merino, MD

## 2016-08-15 DIAGNOSIS — Z85820 Personal history of malignant melanoma of skin: Secondary | ICD-10-CM | POA: Diagnosis not present

## 2016-08-15 DIAGNOSIS — L821 Other seborrheic keratosis: Secondary | ICD-10-CM | POA: Diagnosis not present

## 2016-08-15 DIAGNOSIS — L812 Freckles: Secondary | ICD-10-CM | POA: Diagnosis not present

## 2016-08-15 DIAGNOSIS — D1801 Hemangioma of skin and subcutaneous tissue: Secondary | ICD-10-CM | POA: Diagnosis not present

## 2016-08-20 ENCOUNTER — Ambulatory Visit (INDEPENDENT_AMBULATORY_CARE_PROVIDER_SITE_OTHER): Payer: Medicare Other | Admitting: Rheumatology

## 2016-08-20 DIAGNOSIS — M170 Bilateral primary osteoarthritis of knee: Secondary | ICD-10-CM

## 2016-08-20 MED ORDER — ZOLPIDEM TARTRATE 10 MG PO TABS
5.0000 mg | ORAL_TABLET | Freq: Every evening | ORAL | 0 refills | Status: DC | PRN
Start: 2016-08-20 — End: 2017-08-11

## 2016-08-20 MED ORDER — LIDOCAINE HCL 1 % IJ SOLN
1.5000 mL | INTRAMUSCULAR | Status: AC | PRN
  Administered 2016-08-20: 1.5 mL

## 2016-08-20 MED ORDER — SODIUM HYALURONATE (VISCOSUP) 20 MG/2ML IX SOSY
20.0000 mg | PREFILLED_SYRINGE | INTRA_ARTICULAR | Status: AC | PRN
  Administered 2016-08-20: 20 mg via INTRA_ARTICULAR

## 2016-08-20 MED ORDER — ORENCIA 125 MG/ML ~~LOC~~ SOSY: PREFILLED_SYRINGE | SUBCUTANEOUS | 0 refills | Status: DC

## 2016-09-05 ENCOUNTER — Telehealth: Payer: Self-pay | Admitting: Obstetrics and Gynecology

## 2016-09-05 MED ORDER — ESTRADIOL 10 MCG VA TABS: 1.0000 | ORAL_TABLET | VAGINAL | 0 refills | Status: DC

## 2016-09-05 NOTE — Telephone Encounter (Signed)
Patient can no longer get her medications at her regular pharmacy. Patient is requesting a refill for Yuvafem vaginal inserts to be sent to [facility].

## 2016-09-05 NOTE — Telephone Encounter (Signed)
OK for refill of Vagifem until annual exam is due.  Please send through to pharmacy of choice.

## 2016-09-05 NOTE — Telephone Encounter (Signed)
Rx sent #[id number] to [facility].

## 2016-09-05 NOTE — Telephone Encounter (Signed)
Medication refill request: Yuvafem  Last AEX:  10/31/15 PG Next AEX: 11/07/16 Dr. Quincy Simmonds  Last MMG (if hormonal medication request): 01/16/16 BIRADS1:neg  Refill authorized: 11/01/15 #[id number].  Today please advise.

## 2016-09-11 ENCOUNTER — Ambulatory Visit: Payer: Medicare Other | Admitting: Rheumatology

## 2016-09-19 NOTE — Progress Notes (Signed)
Office Visit Note  Patient: Deanna Norris             Date of Birth: 07/10/1945           MRN: [id number]             PCP: Abner Greenspan, MD Referring: Tower, [name], MD Visit Date: 09/30/2016 Occupation: @GUAROCC @    Subjective:  Medication management, pain feet   History of Present Illness: Deanna Norris is a 71 y.o. female with history of rheumatoid arthritis some, osteoarthritis and disc disease.patient states that she's been tolerating her medications well. She does not have any joint swelling but she had some discomfort in her joints. She's been using over-the-counter products for sicca symptoms.she is not having much discomfort from this disease of her lower back currently. She's been exercising on regular basis. She had IV Reclast this year.  Activities of Daily Living:  Patient reports morning stiffness for 10 minutes.   Patient Reports nocturnal pain.  Difficulty dressing/grooming: Denies Difficulty climbing stairs: Reports Difficulty getting out of chair: Reports Difficulty using hands for taps, buttons, cutlery, and/or writing: Denies   Review of Systems  Constitutional: Positive for fatigue. Negative for night sweats, weight gain, weight loss and weakness.  HENT: Positive for mouth dryness. Negative for mouth sores, trouble swallowing, trouble swallowing and nose dryness.   Eyes: Positive for dryness. Negative for pain, redness and visual disturbance.  Respiratory: Negative for cough, shortness of breath and difficulty breathing.   Cardiovascular: Negative for chest pain, palpitations, hypertension, irregular heartbeat and swelling in legs/feet.  Gastrointestinal: Negative for blood in stool, constipation and diarrhea.  Endocrine: Negative for increased urination.  Genitourinary: Negative for vaginal dryness.  Musculoskeletal: Positive for arthralgias, joint pain, myalgias, morning stiffness and myalgias. Negative for joint swelling, muscle weakness and  muscle tenderness.  Skin: Negative for color change, rash, hair loss, skin tightness, ulcers and sensitivity to sunlight.  Allergic/Immunologic: Negative for susceptible to infections.  Neurological: Negative for dizziness, memory loss and night sweats.  Hematological: Negative for swollen glands.  Psychiatric/Behavioral: Positive for sleep disturbance. Negative for depressed mood. The patient is not nervous/anxious.     PMFS History:  Patient Active Problem List   Diagnosis Date Noted  . 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 Primary osteoarthritis of both hands 01/11/2016  . 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/11/2016  . High risk medication use 11/15/2015  . Sjogren's syndrome ([facility]) 10/31/2015  . Chronic constipation 10/23/2015  . Rosacea 10/23/2015  . Rheumatoid arthritis ([place]) 03/29/2015  . Coronary artery calcification seen on CT scan 11/17/2013  . Recurrent sinusitis 09/16/2013  . Encounter for Medicare annual wellness exam 01/31/2013  . Dyspnea 09/14/2012  . Nodule of right lung 09/14/2012  . 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 Hyperlipidemia 05/08/2009  . 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 ASTHMA 05/08/2009  . GERD 05/08/2009  . MENOPAUSAL DISORDER 05/08/2009  . ARTHRITIS, RHEUMATOID 05/08/2009  . Osteoporosis 05/08/2009  . SPONDYLOLISTHESIS 05/08/2009  . Sleep apnea 05/08/2009    Past Medical History:  Diagnosis Date  . Asthma   . Fibroid   . GERD (gastroesophageal reflux disease)   . Insomnia   . Menopausal symptoms   . Osteoporosis   . Postmenopausal HRT (hormone replacement therapy)   . RA (rheumatoid arthritis) ([place]) 11/2004   methotrexate and Embrel  . Sleep apnea 2008   C-Pap  . Vitamin D deficiency     Family History  Problem  Relation Age of Onset  . Breast cancer Sister 41  . Thyroid disease Sister    Past Surgical History:  Procedure Laterality Date  . ABDOMINAL HYSTERECTOMY  01/1993   partial with AP repair,  secondary to fibroids  . BREAST SURGERY  1991   breast biopsy  . CYSTOCELE REPAIR  10/2007   with graft  . MELANOMA EXCISION Right 01/2012   excision right posterior arm  . NASAL SINUS SURGERY  07/1991  . VAGINAL DELIVERY     x3   Social History   Social History Narrative  . No narrative on file     Objective: Vital Signs: BP 122/70   Pulse 74   Resp 14   Ht 5\' 7"  (1.702 m)   Wt 170 lb (77.1 kg)   LMP 01/06/1993   BMI 26.63 kg/m    Physical Exam  Constitutional: She is oriented to person, place, and time. She appears well-developed and well-nourished.  HENT:  Head: Normocephalic and atraumatic.  Eyes: Conjunctivae and EOM are normal.  Neck: Normal range of motion.  Cardiovascular: Normal rate, regular rhythm, normal heart sounds and intact distal pulses.   Pulmonary/Chest: Effort normal and breath sounds normal.  Abdominal: Soft. Bowel sounds are normal.  Lymphadenopathy:    She has no cervical adenopathy.  Neurological: She is alert and oriented to person, place, and time.  Skin: Skin is warm and dry. Capillary refill takes less than 2 seconds.  Psychiatric: She has a normal mood and affect. Her behavior is normal.  Nursing note and vitals reviewed.    Musculoskeletal Exam: C-spine and thoracic lumbar spine good range of motion. She has mild thoracic kyphosis. Shoulder joints elbow joints wrist joints are good range of motion. She is synovial thickening over her MCP joints but no synovitis was noted. Hip joints knee joints ankles were good range of motion. She has some MTP thickening no synovitis was noted.  CDAI Exam: CDAI Homunculus Exam:   Joint Counts:  CDAI Tender Joint count: 0 CDAI Swollen Joint count: 0  Global Assessments:  Patient Global Assessment: 3 Provider Global Assessment: 3  CDAI Calculated Score: 6    Investigation: No additional findings.TB Gold: Negative in 04/2016 CBC Latest Ref Rng & Units 06/09/2016 04/01/2016 03/24/2016  WBC 3.8 -  10.8 K/uL 6.9 6.6 5.9  Hemoglobin 11.7 - 15.5 g/dL 15.1 14.7 15.1(H)  Hematocrit 35.0 - 45.0 % 45.0 44.5 44.5  Platelets 140 - 400 K/uL 237 224 272.0   CMP Latest Ref Rng & Units 06/09/2016 04/01/2016 04/01/2016  Glucose 65 - 99 mg/dL 78 - 95  BUN 7 - 25 mg/dL 14 - 14  Creatinine 0.60 - 0.93 mg/dL 0.70 - 0.76  Sodium 135 - 146 mmol/L 143 - 139  Potassium 3.5 - 5.3 mmol/L 4.4 - 4.4  Chloride 98 - 110 mmol/L 109 - 103  CO2 20 - 31 mmol/L 22 - 22  Calcium 8.6 - 10.4 mg/dL 9.0 9.1 9.1  Total Protein 6.1 - 8.1 g/dL 6.0(L) - 5.8(L)  Total Bilirubin 0.2 - 1.2 mg/dL 0.5 - 0.3  Alkaline Phos 33 - 130 U/L 93 - 87  AST 10 - 35 U/L 26 - 21  ALT 6 - 29 U/L 23 - 21    Imaging: No results found.  Speciality Comments: No specialty comments available.    Procedures:  No procedures performed Allergies: Adalimumab; Diclofenac; Dicyclomine; Doxycycline hyclate; Fosamax [alendronate sodium]; Methotrexate derivatives; Nickel; Piroxicam; Salagen [pilocarpine hcl]; Sulfamethoxazole-trimethoprim; Tramadol hcl; and Plaquenil [hydroxychloroquine]  Assessment / Plan:     Visit Diagnoses: Rheumatoid arthritis involving multiple sites with positive rheumatoid factor (HCC) - +RF, +CCP, severe erosive. She continues to have some synovial thickening but no active synovitis was noted. She has mild joint stiffness but denies any joint swelling.  High risk medication use - Orencia q qwk sq, MTX 0.8 ml sq q wk, folic acid 2mg  po qd,TB Gold is due in April 2019. - her labs have been stable we will check her labs today and then every 3 months to monitor for drug toxicity.Plan: CBC with Differential/Platelet, COMPLETE METABOLIC PANEL WITH GFR, CBC with Differential/Platelet, COMPLETE METABOLIC PANEL WITH GFR  Sjogren's syndrome, with unspecified organ involvement [facility]): Over-the-counter products were discussed.  Primary osteoarthritis of both hands: She has some underlying joint stiffness.  Primary osteoarthritis of  both knees; Joint protection and muscle strengthening discussed.  DDD (degenerative disc disease), lumbar: She has chronic lower back pain. Senna stretching exercises were demonstrated in the office today.  Osteoporosis - Reclast IVJune 2018,DXA T-3.4 L femur , BMD 0.467. Her DEXA was reviewed today. We will repeat DXA in 2 years.  Other medical problems are listed as follows:  History of gastroesophageal reflux (GERD)  History of sleep apnea  History of hearing loss  History of rosacea  History of hyperlipidemia    Orders: Orders Placed This Encounter  Procedures  . CBC with Differential/Platelet  . COMPLETE METABOLIC PANEL WITH GFR  . CBC with Differential/Platelet  . COMPLETE METABOLIC PANEL WITH GFR   No orders of the defined types were placed in this encounter.   Face-to-face time spent with patient was 30 minutes.greater than 50% of time was spent in counseling and coordination of care.  Follow-Up Instructions: Return in about 5 months (around 03/02/2017) for Rheumatoid arthritis, OA,DDD,OP.   Bo Merino, MD  Note - This record has been created using Editor, commissioning.  Chart creation errors have been sought, but may not always  have been located. Such creation errors do not reflect on  the standard of medical care.

## 2016-09-24 DIAGNOSIS — G4733 Obstructive sleep apnea (adult) (pediatric): Secondary | ICD-10-CM | POA: Diagnosis not present

## 2016-09-30 ENCOUNTER — Ambulatory Visit (INDEPENDENT_AMBULATORY_CARE_PROVIDER_SITE_OTHER): Payer: Medicare Other | Admitting: Rheumatology

## 2016-09-30 ENCOUNTER — Encounter: Payer: Self-pay | Admitting: Rheumatology

## 2016-09-30 VITALS — BP 122/70 | HR 74 | Resp 14 | Ht 67.0 in | Wt 170.0 lb

## 2016-09-30 DIAGNOSIS — Z872 Personal history of diseases of the skin and subcutaneous tissue: Secondary | ICD-10-CM | POA: Diagnosis not present

## 2016-09-30 DIAGNOSIS — Z79899 Other long term (current) drug therapy: Secondary | ICD-10-CM | POA: Diagnosis not present

## 2016-09-30 DIAGNOSIS — Z8639 Personal history of other endocrine, nutritional and metabolic disease: Secondary | ICD-10-CM

## 2016-09-30 DIAGNOSIS — Z8719 Personal history of other diseases of the digestive system: Secondary | ICD-10-CM | POA: Diagnosis not present

## 2016-09-30 DIAGNOSIS — M0579 Rheumatoid arthritis with rheumatoid factor of multiple sites without organ or systems involvement: Secondary | ICD-10-CM | POA: Diagnosis not present

## 2016-09-30 DIAGNOSIS — Z8669 Personal history of other diseases of the nervous system and sense organs: Secondary | ICD-10-CM

## 2016-09-30 DIAGNOSIS — M810 Age-related osteoporosis without current pathological fracture: Secondary | ICD-10-CM

## 2016-09-30 DIAGNOSIS — M19041 Primary osteoarthritis, right hand: Secondary | ICD-10-CM

## 2016-09-30 DIAGNOSIS — M19042 Primary osteoarthritis, left hand: Secondary | ICD-10-CM | POA: Diagnosis not present

## 2016-09-30 DIAGNOSIS — M5136 Other intervertebral disc degeneration, lumbar region: Secondary | ICD-10-CM

## 2016-09-30 DIAGNOSIS — M3500 Sicca syndrome, unspecified: Secondary | ICD-10-CM

## 2016-09-30 DIAGNOSIS — I2510 Atherosclerotic heart disease of native coronary artery without angina pectoris: Secondary | ICD-10-CM

## 2016-09-30 DIAGNOSIS — M170 Bilateral primary osteoarthritis of knee: Secondary | ICD-10-CM | POA: Diagnosis not present

## 2016-09-30 NOTE — Patient Instructions (Signed)
Standing Labs We placed an order today for your standing lab work.    Please come back and get your standing labs in December and every 3 months  We have open lab Monday through Friday from 8:30-11:30 AM and 1:30-4 PM at the office of Dr. Amol Domanski.   The office is located at [address], [address], [place], [place] [address] No appointment is necessary.   Labs are drawn by [facility].  You may receive a bill from [facility] for your lab work. If you have any questions regarding directions or hours of operation,  please call [phone].    

## 2016-10-01 LAB — COMPLETE METABOLIC PANEL WITH GFR
AG Ratio: 1.7 (calc) (ref 1.0–2.5)
ALKALINE PHOSPHATASE (APISO): 87 U/L (ref 33–130)
ALT: 21 U/L (ref 6–29)
AST: 22 U/L (ref 10–35)
Albumin: 3.9 g/dL (ref 3.6–5.1)
BUN: 14 mg/dL (ref 7–25)
CO2: 28 mmol/L (ref 20–32)
Calcium: 9 mg/dL (ref 8.6–10.4)
Chloride: 105 mmol/L (ref 98–110)
Creat: 0.78 mg/dL (ref 0.60–0.93)
GFR, Est African American: 89 mL/min/{1.73_m2} (ref >=60)
GFR, Est Non African American: 77 mL/min/{1.73_m2} (ref >=60)
GLUCOSE: 110 mg/dL — AB (ref 65–99)
Globulin: 2.3 g/dL (calc) (ref 1.9–3.7)
Potassium: 4.2 mmol/L (ref 3.5–5.3)
Sodium: 140 mmol/L (ref 135–146)
Total Bilirubin: 0.4 mg/dL (ref 0.2–1.2)
Total Protein: 6.2 g/dL (ref 6.1–8.1)

## 2016-10-01 LAB — CBC WITH DIFFERENTIAL/PLATELET
BASOS ABS: 91 {cells}/uL (ref 0–200)
BASOS PCT: 1.2 %
EOS ABS: 471 {cells}/uL (ref 15–500)
Eosinophils Relative: 6.2 %
HCT: 43.4 % (ref 35.0–45.0)
HEMOGLOBIN: 15 g/dL (ref 11.7–15.5)
Lymphs Abs: 2310 cells/uL (ref 850–3900)
MCH: 32.8 pg (ref 27.0–33.0)
MCHC: 34.6 g/dL (ref 32.0–36.0)
MCV: 95 fL (ref 80.0–100.0)
MPV: 11 fL (ref 7.5–12.5)
Monocytes Relative: 10 %
NEUTROS ABS: 3967 {cells}/uL (ref 1500–7800)
Neutrophils Relative %: 52.2 %
Platelets: 248 10*3/uL (ref 140–400)
RBC: 4.57 10*6/uL (ref 3.80–5.10)
RDW: 13.6 % (ref 11.0–15.0)
Total Lymphocyte: 30.4 %
WBC: 7.6 10*3/uL (ref 3.8–10.8)
WBCMIX: 760 {cells}/uL (ref 200–950)

## 2016-10-01 NOTE — Progress Notes (Signed)
Labs are stable.

## 2016-10-17 ENCOUNTER — Telehealth: Payer: Self-pay | Admitting: Rheumatology

## 2016-10-17 NOTE — Telephone Encounter (Signed)
Patient left a message requesting Orencia sent to [facility], and MTX injection to [facility].

## 2016-10-20 MED ORDER — METHOTREXATE SODIUM CHEMO INJECTION 50 MG/2ML: 22.5000 mg | INTRAMUSCULAR | 0 refills | Status: DC

## 2016-10-20 MED ORDER — ORENCIA 125 MG/ML ~~LOC~~ SOSY: PREFILLED_SYRINGE | SUBCUTANEOUS | 0 refills | Status: DC

## 2016-10-20 NOTE — Telephone Encounter (Signed)
Last Visit: 09/30/16 Next Visit: 03/03/17 Labs: 09/30/16 stable TB Gold: 04/16/16 Neg  Okay to refill per Dr. Estanislado Pandy  Left message to advise patient.

## 2016-10-24 ENCOUNTER — Ambulatory Visit: Payer: Medicare Other

## 2016-10-30 ENCOUNTER — Encounter: Payer: Self-pay | Admitting: Family Medicine

## 2016-11-04 ENCOUNTER — Ambulatory Visit: Payer: Medicare Other | Admitting: Nurse Practitioner

## 2016-11-06 NOTE — Progress Notes (Signed)
71 y.o. [id number] Married Caucasian female here for annual exam.    Using Vagifem.  Needs a refill.   Some vaginal odor.   On Reclast for osteoporosis.  Dr. Bennie Dallas prescribing.  She sees her every 5 months.   Did receive her flu vaccine.   ROS - excessive thirst, hearing loss, constipation (fiber tablets and magnesium), loss of sexual interest, muscle/joint pain, new mole, feels her balance is not as good.   [place] - 7 grandchildren.   PCP: Loura Pardon, MD    Patient's last menstrual period was 01/06/1993.           Sexually active: Yes.   female The current method of family planning is status post hysterectomy.    Exercising: Yes.    Water aerobics Smoker:  no  Health Maintenance: Pap: 10-18-14 Neg History of abnormal Pap:  no MMG: 01-16-16 3D Density B/Neg/BiRads2:[facility] Colonoscopy: 01/12/13, Normal repeat in 10 years, Dr. Collene Mares BMD: 01-16-16  Result :Osteoporosis Rt.Femoral Neck - Tscore -3.4:[facility] TDaP: 03-23-13 Gardasil:   no HIV:no Hep C: 10-31-15 Neg Screening Labs:  Hb today: PCP, Urine today: not done   reports that she has never smoked. She has never used smokeless tobacco. She reports that she does not drink alcohol or use drugs.  Past Medical History:  Diagnosis Date  . Asthma   . Fibroid   . GERD (gastroesophageal reflux disease)   . Insomnia   . Menopausal symptoms   . Osteoporosis   . Postmenopausal HRT (hormone replacement therapy)   . RA (rheumatoid arthritis) ([place]) 11/2004   methotrexate and Embrel  . Sleep apnea 2008   C-Pap  . Vitamin D deficiency     Past Surgical History:  Procedure Laterality Date  . ABDOMINAL HYSTERECTOMY  01/1993   partial with AP repair, secondary to fibroids  . BREAST SURGERY  1991   breast biopsy  . CYSTOCELE REPAIR  10/2007   with graft  . MELANOMA EXCISION Right 01/2012   excision right posterior arm  . NASAL SINUS SURGERY  07/1991  . VAGINAL DELIVERY     x3    Current Outpatient Prescriptions  Medication Sig  Dispense Refill  . acetaminophen (TYLENOL) 500 MG tablet Take 1,000 mg by mouth every 6 (six) hours as needed.    Marland Kitchen albuterol (PROAIR HFA) 108 (90 BASE) MCG/ACT inhaler Inhale 2 puffs into the lungs every 4 (four) hours as needed. 1 Inhaler 3  . aspirin 81 MG tablet Take 81 mg by mouth daily.     . Calcium Carbonate-Vitamin D (CALCIUM 500/D PO) Take 1 tablet by mouth 2 (two) times daily.    . Cholecalciferol (VITAMIN D3) 2000 UNITS TABS Take 1 tablet by mouth daily.      Marland Kitchen desonide (DESOWEN) 0.05 % lotion Apply topically as needed.    . diclofenac sodium (VOLTAREN) 1 % GEL Apply 1 application topically at bedtime as needed.     Mariane Baumgarten Calcium (STOOL SOFTENER PO) Take 1 capsule by mouth 2 (two) times daily.    . Estradiol 10 MCG TABS vaginal tablet Place 1 tablet (10 mcg total) vaginally 2 (two) times a week. 24 tablet 0  . Flaxseed, Linseed, (FLAX SEED OIL) 1000 MG CAPS Take by mouth daily.    . fluticasone (FLONASE) 50 MCG/ACT nasal spray USE 2 SPRAYS IN EACH NOSTRIL DAILY AS NEEDED FOR RHINITIS OR ALLERGIES 48 g 3  . Folic Acid-Vit J6-BHA L93 (VIRT-GARD) 2.2-25-1 MG TABS Take 1 tablet by mouth daily.    Marland Kitchen  hydrocortisone 2.5 % ointment Apply 1 application topically as needed.     . Ibuprofen 200 MG CAPS Take 3 capsules by mouth as needed.    . Magnesium Malate 1250 (141.7 Mg) MG TABS Take 3 tablets by mouth daily.     . methotrexate 50 MG/2ML injection Inject 0.9 mLs (22.5 mg total) into the skin once a week. 12 mL 0  . metroNIDAZOLE (METROGEL) 0.75 % gel Apply 1 application topically 2 (two) times daily. 45 g 5  . Multiple Vitamins-Minerals (SENIOR MULTIVITAMIN PLUS PO) Take 1 capsule by mouth daily.    . NON FORMULARY CBD cream and oil prn    . ORENCIA 125 MG/ML SOSY Inject one device sub q weekly 12 Syringe 0  . ranitidine (ZANTAC) 300 MG tablet Take 300 mg by mouth 2 (two) times daily.     . Tuberculin-Allergy Syringes 27G X 1/2" 1 ML KIT Inject 1 Syringe into the skin once a week. To  be used weekly with methotrexate.    . zoledronic acid (RECLAST) 5 MG/100ML SOLN injection Inject 5 mg into the vein once. Takes yearly    . zolpidem (AMBIEN) 10 MG tablet Take 0.5 tablets (5 mg total) by mouth at bedtime as needed for sleep. (Patient not taking: Reported on 09/30/2016) 90 tablet 0   No current facility-administered medications for this visit.     Family History  Problem Relation Age of Onset  . Breast cancer Sister 45  . Thyroid disease Sister   . Cancer Sister 80       Leukemia--CLL    ROS:  Pertinent items are noted in HPI.  Otherwise, a comprehensive ROS was negative.  Exam:   BP 120/60 (BP Location: Right Arm, Patient Position: Sitting, Cuff Size: Normal)   Pulse 60   Resp 16   Ht 5' 6"  (1.676 m)   Wt 169 lb (76.7 kg)   LMP 01/06/1993   BMI 27.28 kg/m     General appearance: alert, cooperative and appears stated age Head: Normocephalic, without obvious abnormality, atraumatic Neck: no adenopathy, supple, symmetrical, trachea midline and thyroid normal to inspection and palpation Lungs: clear to auscultation bilaterally Breasts: normal appearance, no masses or tenderness, No nipple retraction or dimpling, No nipple discharge or bleeding, No axillary or supraclavicular adenopathy Heart: regular rate and rhythm Abdomen: soft, non-tender; no masses, no organomegaly Extremities: extremities normal, atraumatic, no cyanosis or edema Skin: Skin color, texture, turgor normal. No rashes or lesions Lymph nodes: Cervical, supraclavicular, and axillary nodes normal. No abnormal inguinal nodes palpated Neurologic: Grossly normal  Pelvic: External genitalia:  no lesions              Urethra:  normal appearing urethra with no masses, tenderness or lesions              Bartholins and Skenes: normal                 Vagina: normal appearing vagina with normal color and discharge, no lesions.  Anterior vaginal wall graft palpable.  First degree rectocele.                Cervix:  Absent.               Pap taken: No. Bimanual Exam:  Uterus:   Absent.               Adnexa: no mass, fullness, tenderness              Rectal exam:  Yes.  .  Confirms.                Anus:  normal sphincter tone, no lesions  Chaperone was present for exam.  Assessment:   Well woman visit with normal exam. Status post TAH/A and P repair - fibroid, prolapse.  Vaginal atrophy.  Vaginal odor.  Borderline severe osteoporosis.  On Reclast.  Hx prior Actonel use.  GERD. Constipation. Rheumatoid arthritis.  Joint pain.  Sjogren's.  FH breast cancer - sister. Genetic testing negative.  Plan: Mammogram screening discussed. Recommended self breast awareness. Pap and HR HPV as above. Guidelines for Calcium, Vitamin D, regular exercise program including cardiovascular and weight bearing exercise. Affirm.  Refill of Vagifem.  Discussed potential increased risk of breast cancer. Reclast and BMD per rheumatology.  Labs per PCP and rheumatology.  Follow up annually and prn.     After visit summary provided.

## 2016-11-07 ENCOUNTER — Ambulatory Visit (INDEPENDENT_AMBULATORY_CARE_PROVIDER_SITE_OTHER): Payer: Medicare Other | Admitting: Obstetrics and Gynecology

## 2016-11-07 ENCOUNTER — Encounter: Payer: Self-pay | Admitting: Obstetrics and Gynecology

## 2016-11-07 VITALS — BP 120/60 | HR 60 | Resp 16 | Ht 66.0 in | Wt 169.0 lb

## 2016-11-07 DIAGNOSIS — Z124 Encounter for screening for malignant neoplasm of cervix: Secondary | ICD-10-CM

## 2016-11-07 DIAGNOSIS — N898 Other specified noninflammatory disorders of vagina: Secondary | ICD-10-CM | POA: Diagnosis not present

## 2016-11-07 DIAGNOSIS — Z01419 Encounter for gynecological examination (general) (routine) without abnormal findings: Secondary | ICD-10-CM | POA: Diagnosis not present

## 2016-11-07 MED ORDER — ESTRADIOL 10 MCG VA TABS: 1.0000 | ORAL_TABLET | VAGINAL | 3 refills | Status: DC

## 2016-11-07 NOTE — Patient Instructions (Addendum)

## 2016-11-08 LAB — VAGINITIS/VAGINOSIS, DNA PROBE
Candida Species: NEGATIVE
Gardnerella vaginalis: NEGATIVE
Trichomonas vaginosis: NEGATIVE

## 2016-11-12 ENCOUNTER — Telehealth: Payer: Self-pay | Admitting: Family Medicine

## 2016-11-12 ENCOUNTER — Other Ambulatory Visit: Payer: Self-pay | Admitting: *Deleted

## 2016-11-12 NOTE — Telephone Encounter (Signed)
Voicemail left for pt to call back to the office regarding which [facility] needed to receive the medication.

## 2016-11-12 NOTE — Telephone Encounter (Signed)
Copied from [place] #[id number]. Topic: General - Other >> Nov 12, 2016  3:54 PM Neva Seat wrote: Would like a refill  Fluticasone  90 Day Supply Call [facility] - [phone]

## 2016-11-13 ENCOUNTER — Other Ambulatory Visit: Payer: Self-pay | Admitting: Family Medicine

## 2016-11-13 MED ORDER — FLUTICASONE PROPIONATE 50 MCG/ACT NA SUSP: NASAL | 3 refills | Status: DC

## 2016-11-13 NOTE — Telephone Encounter (Signed)
See request for Flonase refill

## 2016-11-13 NOTE — Telephone Encounter (Signed)
Copied from [place] [id number]. Topic: Quick Communication - See Telephone Encounter >> Nov 13, 2016  9:17 AM Burnis Medin, NT wrote: CRM for notification. See Telephone encounter for: Pt. Is calling about getting a refill for fluticasone (FLONASE) 50 MCG/ACT nasal spray. Pt. Uses [facility]. Number is [phone]  11/13/16.

## 2016-11-13 NOTE — Telephone Encounter (Signed)
Rx sent to pharmacy   

## 2016-12-17 ENCOUNTER — Telehealth: Payer: Self-pay | Admitting: Rheumatology

## 2016-12-17 MED ORDER — ORENCIA 125 MG/ML ~~LOC~~ SOSY: PREFILLED_SYRINGE | SUBCUTANEOUS | 0 refills | Status: DC

## 2016-12-17 MED ORDER — METHOTREXATE SODIUM CHEMO INJECTION 50 MG/2ML: 22.5000 mg | INTRAMUSCULAR | 0 refills | Status: DC

## 2016-12-17 NOTE — Telephone Encounter (Signed)
Patient needs a refill on Orencia ( [facility]), and MTX injections ([facility]). Please all with concerns.

## 2016-12-17 NOTE — Telephone Encounter (Signed)
Last Visit: 09/30/16 Next Visit: 03/03/17 Labs: 09/30/16 stable TB Gold: 04/16/16 Neg  Okay to refill per Dr. Estanislado Pandy

## 2016-12-22 ENCOUNTER — Telehealth: Payer: Self-pay | Admitting: Rheumatology

## 2016-12-22 ENCOUNTER — Other Ambulatory Visit: Payer: Self-pay | Admitting: *Deleted

## 2016-12-22 DIAGNOSIS — Z79899 Other long term (current) drug therapy: Secondary | ICD-10-CM

## 2016-12-22 NOTE — Telephone Encounter (Signed)
Labs released.  

## 2016-12-22 NOTE — Telephone Encounter (Signed)
Patient needs an order to [facility] on [address] sent now for labs.

## 2016-12-23 DIAGNOSIS — Z79899 Other long term (current) drug therapy: Secondary | ICD-10-CM | POA: Diagnosis not present

## 2016-12-23 LAB — COMPLETE METABOLIC PANEL WITH GFR
AG RATIO: 1.5 (calc) (ref 1.0–2.5)
ALBUMIN MSPROF: 3.7 g/dL (ref 3.6–5.1)
ALT: 24 U/L (ref 6–29)
AST: 28 U/L (ref 10–35)
Alkaline phosphatase (APISO): 86 U/L (ref 33–130)
BILIRUBIN TOTAL: 0.6 mg/dL (ref 0.2–1.2)
BUN: 12 mg/dL (ref 7–25)
CALCIUM: 9.3 mg/dL (ref 8.6–10.4)
CHLORIDE: 104 mmol/L (ref 98–110)
CO2: 29 mmol/L (ref 20–32)
Creat: 0.71 mg/dL (ref 0.60–0.93)
GFR, EST AFRICAN AMERICAN: 99 mL/min/{1.73_m2} (ref >=60)
GFR, EST NON AFRICAN AMERICAN: 86 mL/min/{1.73_m2} (ref >=60)
Globulin: 2.5 g/dL (calc) (ref 1.9–3.7)
Glucose, Bld: 84 mg/dL (ref 65–99)
POTASSIUM: 4.5 mmol/L (ref 3.5–5.3)
Sodium: 139 mmol/L (ref 135–146)
TOTAL PROTEIN: 6.2 g/dL (ref 6.1–8.1)

## 2016-12-23 LAB — CBC WITH DIFFERENTIAL/PLATELET
BASOS ABS: 70 {cells}/uL (ref 0–200)
Basophils Relative: 1.1 %
EOS PCT: 5.4 %
Eosinophils Absolute: 346 cells/uL (ref 15–500)
HEMATOCRIT: 45.3 % — AB (ref 35.0–45.0)
HEMOGLOBIN: 15.4 g/dL (ref 11.7–15.5)
LYMPHS ABS: 1562 {cells}/uL (ref 850–3900)
MCH: 32 pg (ref 27.0–33.0)
MCHC: 34 g/dL (ref 32.0–36.0)
MCV: 94 fL (ref 80.0–100.0)
MPV: 10.8 fL (ref 7.5–12.5)
Monocytes Relative: 15.7 %
NEUTROS ABS: 3418 {cells}/uL (ref 1500–7800)
Neutrophils Relative %: 53.4 %
Platelets: 245 10*3/uL (ref 140–400)
RBC: 4.82 10*6/uL (ref 3.80–5.10)
RDW: 13.8 % (ref 11.0–15.0)
Total Lymphocyte: 24.4 %
WBC: 6.4 10*3/uL (ref 3.8–10.8)
WBCMIX: 1005 {cells}/uL — AB (ref 200–950)

## 2016-12-24 NOTE — Progress Notes (Signed)
Labs are stable.

## 2017-02-03 DIAGNOSIS — Z803 Family history of malignant neoplasm of breast: Secondary | ICD-10-CM | POA: Diagnosis not present

## 2017-02-03 DIAGNOSIS — Z1231 Encounter for screening mammogram for malignant neoplasm of breast: Secondary | ICD-10-CM | POA: Diagnosis not present

## 2017-02-13 ENCOUNTER — Telehealth: Payer: Self-pay | Admitting: Rheumatology

## 2017-02-13 MED ORDER — ORENCIA 125 MG/ML ~~LOC~~ SOSY: PREFILLED_SYRINGE | SUBCUTANEOUS | 0 refills | Status: DC

## 2017-02-13 MED ORDER — METHOTREXATE SODIUM CHEMO INJECTION 50 MG/2ML: 22.5000 mg | INTRAMUSCULAR | 0 refills | Status: DC

## 2017-02-13 NOTE — Telephone Encounter (Signed)
Patient called requesting a prescription refill for Orencia to be called into [facility] and Methotrexate to be called into [facility] store.

## 2017-02-13 NOTE — Telephone Encounter (Signed)
Last Visit: 09/30/16 Next Visit: 03/03/17 Labs: 01/02/17 Stable TB Gold: 04/16/16 Neg   Okay to refill per Dr. Estanislado Pandy

## 2017-02-16 DIAGNOSIS — L820 Inflamed seborrheic keratosis: Secondary | ICD-10-CM | POA: Diagnosis not present

## 2017-02-16 DIAGNOSIS — D1801 Hemangioma of skin and subcutaneous tissue: Secondary | ICD-10-CM | POA: Diagnosis not present

## 2017-02-16 DIAGNOSIS — L821 Other seborrheic keratosis: Secondary | ICD-10-CM | POA: Diagnosis not present

## 2017-02-16 DIAGNOSIS — Z85820 Personal history of malignant melanoma of skin: Secondary | ICD-10-CM | POA: Diagnosis not present

## 2017-02-16 DIAGNOSIS — L812 Freckles: Secondary | ICD-10-CM | POA: Diagnosis not present

## 2017-02-17 NOTE — Progress Notes (Signed)
Office Visit Note  Patient: Deanna Norris             Date of Birth: [birth year]           MRN: [id number]             PCP: Deanna Greenspan, MD Referring: Norris, Deanna Fanny, MD Visit Date: 03/03/2017 Occupation: @GUAROCC @    Subjective:  Other (neck pain)   History of Present Illness: [name] is a 72 y.o. female history of sero positive rheumatoid arthritis, Sjogren's, osteoarthritis and osteoporosis.  She states she has been having some discomfort in her C-spine.  The neck pain is localized to the C-spine and radiates slightly to the left shoulder.  She also has some discomfort in the back of her right lower extremity.  She denies any joint swelling.  She states with the combination of Orencia and methotrexate her arthritis is quite well controlled.  He continues to have some sicca symptoms.  She is not having much discomfort in her hands and knees currently.  Her lower back pain is tolerable.  Activities of Daily Living:  Patient reports morning stiffness for 5 minutes.   Patient Denies nocturnal pain.  Difficulty dressing/grooming: Denies Difficulty climbing stairs: Denies Difficulty getting out of chair: Denies Difficulty using hands for taps, buttons, cutlery, and/or writing: Denies   Review of Systems  Constitutional: Negative for fatigue, night sweats, weight gain, weight loss and weakness.  HENT: Positive for mouth dryness. Negative for mouth sores, trouble swallowing, trouble swallowing and nose dryness.   Eyes: Positive for dryness. Negative for pain, redness and visual disturbance.  Respiratory: Negative for cough, shortness of breath and difficulty breathing.   Cardiovascular: Negative for chest pain, palpitations, hypertension, irregular heartbeat and swelling in legs/feet.  Gastrointestinal: Positive for constipation. Negative for blood in stool and diarrhea.  Endocrine: Negative for increased urination.  Genitourinary: Negative for vaginal dryness.    Musculoskeletal: Positive for arthralgias, joint pain and morning stiffness. Negative for joint swelling, myalgias, muscle weakness, muscle tenderness and myalgias.  Skin: Negative for color change, rash, hair loss, skin tightness, ulcers and sensitivity to sunlight.  Allergic/Immunologic: Negative for susceptible to infections.  Neurological: Negative for dizziness, memory loss and night sweats.  Hematological: Negative for swollen glands.  Psychiatric/Behavioral: Positive for sleep disturbance. Negative for depressed mood. The patient is not nervous/anxious.     PMFS History:  Patient Active Problem List   Diagnosis Date Noted  . 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 Primary osteoarthritis of both hands 01/11/2016  . 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/11/2016  . High risk medication use 11/15/2015  . Sjogren's syndrome ([place]) 10/31/2015  . Chronic constipation 10/23/2015  . Rosacea 10/23/2015  . Rheumatoid arthritis ([place]) 03/29/2015  . Coronary artery calcification seen on CT scan 11/17/2013  . Recurrent sinusitis 09/16/2013  . Encounter for Medicare annual wellness exam 01/31/2013  . Dyspnea 09/14/2012  . Nodule of right lung 09/14/2012  . 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 Hyperlipidemia 05/08/2009  . 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 ASTHMA 05/08/2009  . GERD 05/08/2009  . MENOPAUSAL DISORDER 05/08/2009  . ARTHRITIS, RHEUMATOID 05/08/2009  . Osteoporosis 05/08/2009  . SPONDYLOLISTHESIS 05/08/2009  . Sleep apnea 05/08/2009    Past Medical History:  Diagnosis Date  . Asthma   . Fibroid   . GERD (gastroesophageal reflux disease)   . Insomnia   . Menopausal symptoms   . Osteoporosis   . Postmenopausal HRT (hormone  replacement therapy)   . RA (rheumatoid arthritis) ([place]) 11/2004   methotrexate and Embrel  . Sleep apnea 2008   C-Pap  . Vitamin D deficiency     Family History  Problem Relation Age of Onset  . Breast cancer Sister  65  . Thyroid disease Sister   . Cancer Sister 68       Leukemia--CLL   Past Surgical History:  Procedure Laterality Date  . ABDOMINAL HYSTERECTOMY  01/1993   partial with AP repair, secondary to fibroids  . BREAST SURGERY  1991   breast biopsy  . CYSTOCELE REPAIR  10/2007   with graft  . MELANOMA EXCISION Right 01/2012   excision right posterior arm  . NASAL SINUS SURGERY  07/1991  . VAGINAL DELIVERY     x3   Social History   Social History Narrative  . Not on file     Objective: Vital Signs: BP 114/67 (BP Location: Left Arm, Patient Position: Sitting, Cuff Size: Normal)   Pulse (!) 54   Ht 5\' 6"  (1.676 m)   Wt 168 lb (76.2 kg)   LMP 01/06/1993   BMI 27.12 kg/m    Physical Exam  Constitutional: She is oriented to person, place, and time. She appears well-developed and well-nourished.  HENT:  Head: Normocephalic and atraumatic.  Eyes: Conjunctivae and EOM are normal.  Neck: Normal range of motion.  Cardiovascular: Normal rate, regular rhythm, normal heart sounds and intact distal pulses.  Pulmonary/Chest: Effort normal and breath sounds normal.  Abdominal: Soft. Bowel sounds are normal.  Lymphadenopathy:    She has no cervical adenopathy.  Neurological: She is alert and oriented to person, place, and time.  Skin: Skin is warm and dry. Capillary refill takes less than 2 seconds.  Psychiatric: She has a normal mood and affect. Her behavior is normal.  Nursing note and vitals reviewed.    Musculoskeletal Exam: C-spine she has good range of motion without discomfort or radiculopathy she has some spasm in the left trapezius muscle.  Some discomfort range of motion of lumbar spine.  Shoulder joints, elbow joints, wrist joints are good range of motion.  She is synovial thickening over bilateral MCP joints and PIP joints without any active synovitis.  DIP thickening was noted as well.  Hip joints knee joints ankles MTPs PIPs with good range of motion with no  synovitis.  CDAI Exam: CDAI Homunculus Exam:   Joint Counts:  CDAI Tender Joint count: 0 CDAI Swollen Joint count: 0  Global Assessments:  Patient Global Assessment: 2 Provider Global Assessment: 2  CDAI Calculated Score: 4    Investigation: No additional findings.TB Gold: 04/16/2016 Negative  CBC Latest Ref Rng & Units 12/23/2016 09/30/2016 06/09/2016  WBC 3.8 - 10.8 Thousand/uL 6.4 7.6 6.9  Hemoglobin 11.7 - 15.5 g/dL 15.4 15.0 15.1  Hematocrit 35.0 - 45.0 % 45.3(H) 43.4 45.0  Platelets 140 - 400 Thousand/uL 245 248 237   CMP Latest Ref Rng & Units 12/23/2016 09/30/2016 06/09/2016  Glucose 65 - 99 mg/dL 84 110(H) 78  BUN 7 - 25 mg/dL 12 14 14   Creatinine 0.60 - 0.93 mg/dL 0.71 0.78 0.70  Sodium 135 - 146 mmol/L 139 140 143  Potassium 3.5 - 5.3 mmol/L 4.5 4.2 4.4  Chloride 98 - 110 mmol/L 104 105 109  CO2 20 - 32 mmol/L 29 28 22   Calcium 8.6 - 10.4 mg/dL 9.3 9.0 9.0  Total Protein 6.1 - 8.1 g/dL 6.2 6.2 6.0(L)  Total Bilirubin 0.2 - 1.2  mg/dL 0.6 0.4 0.5  Alkaline Phos 33 - 130 U/L - - 93  AST 10 - 35 U/L 28 22 26   ALT 6 - 29 U/L 24 21 23     Imaging: No results found.  Speciality Comments: No specialty comments available.    Procedures:  No procedures performed Allergies: Adalimumab; Diclofenac; Dicyclomine; Doxycycline hyclate; Fosamax [alendronate sodium]; Methotrexate derivatives; Nickel; Piroxicam; Salagen [pilocarpine hcl]; Sulfamethoxazole-trimethoprim; Tramadol hcl; and Plaquenil [hydroxychloroquine]   Assessment / Plan:     Visit Diagnoses: Rheumatoid arthritis involving multiple sites with positive rheumatoid factor (HCC) - +RF, +CCP, severe erosive.  Patient has synovial thickening but no active synovitis on examination.  She is doing quite well on current combination of medication.  We discussed reducing dose of methotrexate to 0.8 mL subcu as a trial.  If she gets worsening of her symptoms she can increase methotrexate to 0.9 mL subcu weekly.  High risk  medication use - Orencia q wk, MTX 0.9 sq q wk, folic acid 2mg  po qd - Plan: CBC with Differential/Platelet, COMPLETE METABOLIC PANEL WITH GFR, QuantiFERON-TB Gold Plus today and then every 3 months to monitor for drug toxicity.  Sjogren's syndrome, with unspecified organ involvement [facility]): She continues to have sicca symptoms.  Over-the-counter products was discussed.  Neck pain: She has some trapezius spasm C-spine with good range of motion without any radiculopathy.  C-spine exercise handout was given.  Primary osteoarthritis of both hands: Joint protection muscle strengthening discussed.  Primary osteoarthritis of both knees  Spondylosis of lumbar region without myelopathy or radiculopathy  Age-related osteoporosis without current pathological fracture - Reclast IVJune 2018,DXA T-3.4 L femur , BMD 0.467. Her DEXA was reviewed today. We will repeat DXA in 2 years.  I will schedule her IV Reclast in June 2019.  Other medical problems are listed as follows:  History of hyperlipidemia  History of gastroesophageal reflux (GERD)  History of rosacea  History of hearing loss  History of sleep apnea    Orders: Orders Placed This Encounter  Procedures  . CBC with Differential/Platelet  . COMPLETE METABOLIC PANEL WITH GFR  . QuantiFERON-TB Gold Plus   No orders of the defined types were placed in this encounter.   Face-to-face time spent with patient was 30 minutes.  Greater than 50% of time was spent in counseling and coordination of care.  Follow-Up Instructions: Return in about 5 months (around 07/31/2017) for Rheumatoid arthritis, Osteoarthritis, Sjogren's, Osteoporosis.   Bo Merino, MD  Note - This record has been created using Editor, commissioning.  Chart creation errors have been sought, but may not always  have been located. Such creation errors do not reflect on  the standard of medical care.

## 2017-03-03 ENCOUNTER — Encounter: Payer: Self-pay | Admitting: Rheumatology

## 2017-03-03 ENCOUNTER — Ambulatory Visit (INDEPENDENT_AMBULATORY_CARE_PROVIDER_SITE_OTHER): Payer: Medicare Other | Admitting: Rheumatology

## 2017-03-03 VITALS — BP 114/67 | HR 54 | Ht 66.0 in | Wt 168.0 lb

## 2017-03-03 DIAGNOSIS — Z8669 Personal history of other diseases of the nervous system and sense organs: Secondary | ICD-10-CM | POA: Diagnosis not present

## 2017-03-03 DIAGNOSIS — M47816 Spondylosis without myelopathy or radiculopathy, lumbar region: Secondary | ICD-10-CM

## 2017-03-03 DIAGNOSIS — M170 Bilateral primary osteoarthritis of knee: Secondary | ICD-10-CM | POA: Diagnosis not present

## 2017-03-03 DIAGNOSIS — Z79899 Other long term (current) drug therapy: Secondary | ICD-10-CM

## 2017-03-03 DIAGNOSIS — Z8639 Personal history of other endocrine, nutritional and metabolic disease: Secondary | ICD-10-CM | POA: Diagnosis not present

## 2017-03-03 DIAGNOSIS — M810 Age-related osteoporosis without current pathological fracture: Secondary | ICD-10-CM

## 2017-03-03 DIAGNOSIS — M0579 Rheumatoid arthritis with rheumatoid factor of multiple sites without organ or systems involvement: Secondary | ICD-10-CM | POA: Diagnosis not present

## 2017-03-03 DIAGNOSIS — M542 Cervicalgia: Secondary | ICD-10-CM | POA: Diagnosis not present

## 2017-03-03 DIAGNOSIS — M19041 Primary osteoarthritis, right hand: Secondary | ICD-10-CM | POA: Diagnosis not present

## 2017-03-03 DIAGNOSIS — M3500 Sicca syndrome, unspecified: Secondary | ICD-10-CM | POA: Diagnosis not present

## 2017-03-03 DIAGNOSIS — M19042 Primary osteoarthritis, left hand: Secondary | ICD-10-CM

## 2017-03-03 DIAGNOSIS — Z872 Personal history of diseases of the skin and subcutaneous tissue: Secondary | ICD-10-CM | POA: Diagnosis not present

## 2017-03-03 DIAGNOSIS — Z8719 Personal history of other diseases of the digestive system: Secondary | ICD-10-CM | POA: Diagnosis not present

## 2017-03-03 MED ORDER — FOLIC ACID 1 MG PO TABS: 2.0000 mg | ORAL_TABLET | Freq: Every day | ORAL | 3 refills | Status: DC

## 2017-03-03 NOTE — Patient Instructions (Addendum)
Standing Labs We placed an order today for your standing lab work.    Please come back and get your standing labs in June and every 3 months Cervical Strain and Sprain Rehab Ask your health care provider which exercises are safe for you. Do exercises exactly as told by your health care provider and adjust them as directed. It is normal to feel mild stretching, pulling, tightness, or discomfort as you do these exercises, but you should stop right away if you feel sudden pain or your pain gets worse.Do not begin these exercises until told by your health care provider. Stretching and range of motion exercises These exercises warm up your muscles and joints and improve the movement and flexibility of your neck. These exercises also help to relieve pain, numbness, and tingling. Exercise A: Cervical side bend  1. Using good posture, sit on a stable chair or stand up. 2. Without moving your shoulders, slowly tilt your left / right ear to your shoulder until you feel a stretch in your neck muscles. You should be looking straight ahead. 3. Hold for __________ seconds. 4. Repeat with the other side of your neck. Repeat __________ times. Complete this exercise __________ times a day. Exercise B: Cervical rotation  1. Using good posture, sit on a stable chair or stand up. 2. Slowly turn your head to the side as if you are looking over your left / right shoulder. ? Keep your eyes level with the ground. ? Stop when you feel a stretch along the side and the back of your neck. 3. Hold for __________ seconds. 4. Repeat this by turning to your other side. Repeat __________ times. Complete this exercise __________ times a day. Exercise C: Thoracic extension and pectoral stretch 1. Roll a towel or a small blanket so it is about 4 inches (10 cm) in diameter. 2. Lie down on your back on a firm surface. 3. Put the towel lengthwise, under your spine in the middle of your back. It should not be not under your  shoulder blades. The towel should line up with your spine from your middle back to your lower back. 4. Put your hands behind your head and let your elbows fall out to your sides. 5. Hold for __________ seconds. Repeat __________ times. Complete this exercise __________ times a day. Strengthening exercises These exercises build strength and endurance in your neck. Endurance is the ability to use your muscles for a long time, even after your muscles get tired. Exercise D: Upper cervical flexion, isometric 1. Lie on your back with a thin pillow behind your head and a small rolled-up towel under your neck. 2. Gently tuck your chin toward your chest and nod your head down to look toward your feet. Do not lift your head off the pillow. 3. Hold for __________ seconds. 4. Release the tension slowly. Relax your neck muscles completely before you repeat this exercise. Repeat __________ times. Complete this exercise __________ times a day. Exercise E: Cervical extension, isometric  1. Stand about 6 inches (15 cm) away from a wall, with your back facing the wall. 2. Place a soft object, about 6-8 inches (15-20 cm) in diameter, between the back of your head and the wall. A soft object could be a small pillow, a ball, or a folded towel. 3. Gently tilt your head back and press into the soft object. Keep your jaw and forehead relaxed. 4. Hold for __________ seconds. 5. Release the tension slowly. Relax your neck muscles completely  before you repeat this exercise. Repeat __________ times. Complete this exercise __________ times a day. Posture and body mechanics  Body mechanics refers to the movements and positions of your body while you do your daily activities. Posture is part of body mechanics. Good posture and healthy body mechanics can help to relieve stress in your body's tissues and joints. Good posture means that your spine is in its natural S-curve position (your spine is neutral), your shoulders are  pulled back slightly, and your head is not tipped forward. The following are general guidelines for applying improved posture and body mechanics to your everyday activities. Standing  When standing, keep your spine neutral and keep your feet about hip-width apart. Keep a slight bend in your knees. Your ears, shoulders, and hips should line up.  When you do a task in which you stand in one place for a long time, place one foot up on a stable object that is 2-4 inches (5-10 cm) high, such as a footstool. This helps keep your spine neutral. Sitting   When sitting, keep your spine neutral and your keep feet flat on the floor. Use a footrest, if necessary, and keep your thighs parallel to the floor. Avoid rounding your shoulders, and avoid tilting your head forward.  When working at a desk or a computer, keep your desk at a height where your hands are slightly lower than your elbows. Slide your chair under your desk so you are close enough to maintain good posture.  When working at a computer, place your monitor at a height where you are looking straight ahead and you do not have to tilt your head forward or downward to look at the screen. Resting When lying down and resting, avoid positions that are most painful for you. Try to support your neck in a neutral position. You can use a contour pillow or a small rolled-up towel. Your pillow should support your neck but not push on it. This information is not intended to replace advice given to you by your health care provider. Make sure you discuss any questions you have with your health care provider. Document Released: 12/23/2004 Document Revised: 08/30/2015 Document Reviewed: 11/29/2014 Elsevier Interactive Patient Education  [employer].   We have open lab Monday through Friday from 8:30-11:30 AM and 1:30-4 PM at the office of Dr. Bo Merino.   The office is located at [address], [place], [place], [place] [address] No appointment  is necessary.   Labs are drawn by [facility].  You may receive a bill from [facility] for your lab work. If you have any questions regarding directions or hours of operation,  please call [phone].

## 2017-03-04 NOTE — Progress Notes (Signed)
Labs are stable.

## 2017-03-05 LAB — CBC WITH DIFFERENTIAL/PLATELET
BASOS ABS: 92 {cells}/uL (ref 0–200)
Basophils Relative: 1.2 %
Eosinophils Absolute: 239 cells/uL (ref 15–500)
Eosinophils Relative: 3.1 %
HCT: 47 % — ABNORMAL HIGH (ref 35.0–45.0)
HEMOGLOBIN: 15.7 g/dL — AB (ref 11.7–15.5)
Lymphs Abs: 2118 cells/uL (ref 850–3900)
MCH: 31.9 pg (ref 27.0–33.0)
MCHC: 33.4 g/dL (ref 32.0–36.0)
MCV: 95.5 fL (ref 80.0–100.0)
MONOS PCT: 9.4 %
MPV: 10.9 fL (ref 7.5–12.5)
Neutro Abs: 4528 cells/uL (ref 1500–7800)
Neutrophils Relative %: 58.8 %
PLATELETS: 261 10*3/uL (ref 140–400)
RBC: 4.92 10*6/uL (ref 3.80–5.10)
RDW: 14.1 % (ref 11.0–15.0)
TOTAL LYMPHOCYTE: 27.5 %
WBC: 7.7 10*3/uL (ref 3.8–10.8)
WBCMIX: 724 {cells}/uL (ref 200–950)

## 2017-03-05 LAB — COMPLETE METABOLIC PANEL WITH GFR
AG RATIO: 1.6 (calc) (ref 1.0–2.5)
ALBUMIN MSPROF: 4 g/dL (ref 3.6–5.1)
ALKALINE PHOSPHATASE (APISO): 74 U/L (ref 33–130)
ALT: 21 U/L (ref 6–29)
AST: 25 U/L (ref 10–35)
BILIRUBIN TOTAL: 0.4 mg/dL (ref 0.2–1.2)
BUN: 15 mg/dL (ref 7–25)
CHLORIDE: 104 mmol/L (ref 98–110)
CO2: 29 mmol/L (ref 20–32)
Calcium: 9.9 mg/dL (ref 8.6–10.4)
Creat: 0.65 mg/dL (ref 0.60–0.93)
GFR, EST AFRICAN AMERICAN: 104 mL/min/{1.73_m2} (ref >=60)
GFR, Est Non African American: 89 mL/min/{1.73_m2} (ref >=60)
GLUCOSE: 81 mg/dL (ref 65–99)
Globulin: 2.5 g/dL (calc) (ref 1.9–3.7)
POTASSIUM: 4.8 mmol/L (ref 3.5–5.3)
Sodium: 140 mmol/L (ref 135–146)
TOTAL PROTEIN: 6.5 g/dL (ref 6.1–8.1)

## 2017-03-05 LAB — QUANTIFERON-TB GOLD PLUS
Mitogen-NIL: >10 IU/mL
NIL: 0.03 [IU]/mL
QUANTIFERON-TB GOLD PLUS: NEGATIVE
TB1-NIL: 0.01 IU/mL
TB2-NIL: 0.02 IU/mL

## 2017-04-01 ENCOUNTER — Ambulatory Visit: Payer: Medicare Other

## 2017-04-01 ENCOUNTER — Ambulatory Visit (INDEPENDENT_AMBULATORY_CARE_PROVIDER_SITE_OTHER): Payer: Medicare Other

## 2017-04-01 VITALS — BP 122/80 | HR 56 | Temp 97.7°F | Ht 65.75 in | Wt 168.2 lb

## 2017-04-01 DIAGNOSIS — Z0000 Encounter for general adult medical examination without abnormal findings: Secondary | ICD-10-CM | POA: Diagnosis not present

## 2017-04-01 DIAGNOSIS — E785 Hyperlipidemia, unspecified: Secondary | ICD-10-CM | POA: Diagnosis not present

## 2017-04-01 LAB — LIPID PANEL
Cholesterol: 220 mg/dL — ABNORMAL HIGH (ref 0–200)
HDL: 67.3 mg/dL (ref >39.00)
LDL CALC: 131 mg/dL — AB (ref 0–99)
NONHDL: 152.29
Total CHOL/HDL Ratio: 3
Triglycerides: 105 mg/dL (ref 0.0–149.0)
VLDL: 21 mg/dL (ref 0.0–40.0)

## 2017-04-01 LAB — TSH: TSH: 2.38 u[IU]/mL (ref 0.35–4.50)

## 2017-04-01 NOTE — Progress Notes (Signed)
PCP notes:   Health maintenance:  No gaps identified.   Abnormal screenings:   Hearing - failed  Hearing Screening   125Hz  250Hz  500Hz  1000Hz  2000Hz  3000Hz  4000Hz  6000Hz  8000Hz   Right ear:   40 40 40  0    Left ear:   40 40 40  0     Patient concerns:   None  Nurse concerns:  None  Next PCP appt:   04/08/2017 @ 1430

## 2017-04-01 NOTE — Progress Notes (Signed)
Subjective:   Deanna Norris is a 72 y.o. female who presents for Medicare Annual (Subsequent) preventive examination.  Review of Systems:  N/A Cardiac Risk Factors include: advanced age (>23mn, >>24women);dyslipidemia     Objective:     Vitals: BP 122/80 (BP Location: Right Arm, Patient Position: Sitting, Cuff Size: Normal)   Pulse (!) 56   Temp 97.7 F (36.5 C) (Oral)   Ht 5' 5.75" (1.67 m) Comment: no shoes  Wt 168 lb 4 oz (76.3 kg)   LMP 01/06/1993   SpO2 97%   BMI 27.36 kg/m   Body mass index is 27.36 kg/m.  Advanced Directives 04/01/2017 03/31/2016 03/28/2015 03/28/2015  Does Patient Have a Medical Advance Directive? Yes Yes Yes Yes  Type of [place]amed[place]of [place] will [place] will [place] will [place] will  Does patient want to make changes to medical advance directive? - - No - Patient declined No - Patient declined  Copy of [place] Chart? No - copy requested No - copy requested No - copy requested No - copy requested    Tobacco Social History   Tobacco Use  Smoking Status Never Smoker  Smokeless Tobacco Never Used     Counseling given: No   Clinical Intake:  Pre-visit preparation completed: Yes  Pain : No/denies pain Pain Score: 0-No pain     Nutritional Status: BMI 25 -29 Overweight Nutritional Risks: None Diabetes: No  How often do you need to have someone help you when you read instructions, pamphlets, or other written materials from your doctor or pharmacy?: 1 - Never What is the last grade level you completed in school?: 12th grade  Interpreter Needed?: No  Comments: pt lives with spouse Information entered by :: LPinson, LPN  Past Medical History:  Diagnosis Date  . Asthma   . Fibroid   . GERD (gastroesophageal reflux disease)   . Insomnia   . Menopausal symptoms   . Osteoporosis   .  Postmenopausal HRT (hormone replacement therapy)   . [place]oid [place]   methotrexate and Embrel  . Sleep apnea 2008   C-Pap  . Vitamin D deficiency    Past Surgical History:  Procedure Laterality Date  . ABDOMINAL HYSTERECTOMY  01/1993   partial with AP repair, secondary to fibroids  . BREAST SURGERY  1991   breast biopsy  . CYSTOCELE REPAIR  10/2007   with graft  . MELANOMA EXCISION Right 01/2012   excision right posterior arm  . NASAL SINUS SURGERY  07/1991  . VAGINAL DELIVERY     x3   Family History  Problem Relation Age of Onset  . Breast cancer Sister 641 . Thyroid disease Sister   . Cancer Sister 675      Leukemia--CLL   Social History   Socioeconomic History  . Marital status: Married    Spouse name: Not on file  . Number of children: Not on file  . Years of education: Not on file  . Highest education level: Not on file  Occupational History  . Occupation: housewife    Employer: Not Employed  Social Needs  . Financial resource strain: Not on file  . Food insecurity:    Worry: Not on file    Inability: Not on file  . Transportation needs:    Medical: Not on file    Non-medical: Not on file  Tobacco Use  .  Smoking status: Never Smoker  . Smokeless tobacco: Never Used  Substance and Sexual Activity  . Alcohol use: No    Alcohol/week: 0.0 oz  . Drug use: No  . Sexual activity: Yes    Partners: Male    Birth control/protection: Surgical    Comment: TAH  Lifestyle  . Physical activity:    Days per week: Not on file    Minutes per session: Not on file  . Stress: Not on file  Relationships  . Social connections:    Talks on phone: Not on file    Gets together: Not on file    Attends religious service: Not on file    Active member of club or organization: Not on file    Attends meetings of clubs or organizations: Not on file    Relationship status: Not on file  Other Topics Concern  . Not on file  Social History Narrative  .  Not on file    Outpatient Encounter Medications as of 04/01/2017  Medication Sig  . acetaminophen (TYLENOL) 500 MG tablet Take 1,000 mg by mouth every 6 (six) hours as needed.  Marland Kitchen albuterol (PROAIR HFA) 108 (90 BASE) MCG/ACT inhaler Inhale 2 puffs into the lungs every 4 (four) hours as needed.  Marland Kitchen aspirin 81 MG tablet Take 81 mg by mouth daily.   . Calcium Carbonate-Vitamin D (CALCIUM 500/D PO) Take 1 tablet by mouth 2 (two) times daily.  . Cholecalciferol (VITAMIN D3) 2000 UNITS TABS Take 1 tablet by mouth daily.    Marland Kitchen desonide (DESOWEN) 0.05 % lotion Apply topically as needed.  . diclofenac sodium (VOLTAREN) 1 % GEL Apply 1 application topically at bedtime as needed.   Mariane Baumgarten Calcium (STOOL SOFTENER PO) Take 1 capsule by mouth 2 (two) times daily.  . Estradiol 10 MCG TABS vaginal tablet Place 1 tablet (10 mcg total) vaginally 2 (two) times a week.  . Flaxseed, Linseed, (FLAX SEED OIL) 1000 MG CAPS Take by mouth daily.  . fluticasone (FLONASE) 50 MCG/ACT nasal spray USE 2 SPRAYS IN EACH NOSTRIL DAILY AS NEEDED FOR RHINITIS OR ALLERGIES (Patient taking differently: daily. USE 2 SPRAYS IN EACH NOSTRIL DAILY AS NEEDED FOR RHINITIS OR ALLERGIES)  . folic acid (FOLVITE) 1 MG tablet Take 2 tablets (2 mg total) by mouth daily.  . hydrocortisone 2.5 % ointment Apply 1 application topically as needed.   . Ibuprofen 200 MG CAPS Take 3 capsules by mouth as needed.  . Magnesium Malate 1250 (141.7 Mg) MG TABS Take 3 tablets by mouth daily.   . methotrexate 50 MG/2ML injection Inject 0.9 mLs (22.5 mg total) into the skin once a week.  . metroNIDAZOLE (METROGEL) 0.75 % gel Apply 1 application topically 2 (two) times daily.  . Multiple Vitamins-Minerals (SENIOR MULTIVITAMIN PLUS PO) Take 1 capsule by mouth daily.  . NON FORMULARY CBD cream and oil prn  . ORENCIA 125 MG/ML SOSY Inject one device sub q weekly  . ranitidine (ZANTAC) 300 MG tablet Take 300 mg by mouth 2 (two) times daily.   .  Tuberculin-Allergy Syringes 27G X 1/2" 1 ML KIT Inject 1 Syringe into the skin once a week. To be used weekly with methotrexate.  . zoledronic acid (RECLAST) 5 MG/100ML SOLN injection Inject 5 mg into the vein once. Takes yearly  . zolpidem (AMBIEN) 10 MG tablet Take 0.5 tablets (5 mg total) by mouth at bedtime as needed for sleep. (Patient not taking: Reported on 09/30/2016)  . [DISCONTINUED] Folic Acid-Vit Z6-OQH  B12 (VIRT-GARD) 2.2-25-1 MG TABS Take 1 tablet by mouth daily.   No facility-administered encounter medications on file as of 04/01/2017.     Activities of Daily Living In your present state of health, do you have any difficulty performing the following activities: 04/01/2017 06/19/2016  Hearing? Y N  Vision? N N  Difficulty concentrating or making decisions? N N  Walking or climbing stairs? N N  Dressing or bathing? N N  Doing errands, shopping? N -  Preparing Food and eating ? N -  Using the Toilet? N -  In the past six months, have you accidently leaked urine? Y -  Do you have problems with loss of bowel control? N -  Managing your Medications? N -  Managing your Finances? N -  Housekeeping or managing your Housekeeping? N -  Some recent data might be hidden    Patient Care Team: Tower, [name], MD as PCP - General Jarome Matin, MD as Consulting Physician (Dermatology) Juanita Craver, MD as Consulting Physician (Gastroenterology) Marica Otter, OD as Consulting Physician (Optometry) Belva Crome, MD as Consulting Physician (Cardiology)    Assessment:   This is a routine wellness examination for [name].   Hearing Screening   _0  _1  _2  _3  _4  _5  _6  _7  _8   Right ear:   40 40 40  0    Left ear:   40 40 40  0    Vision Screening Comments: Last vision exam in Jul 2018 with Dr. Sabra Heck    Exercise Activities and Dietary recommendations Current Exercise Habits: Home exercise routine, Type of exercise: walking;yoga, Time (Minutes): 20(yoga  60 minutes), Frequency (Times/Week): 3, Weekly Exercise (Minutes/Week): 60, Intensity: Mild, Exercise limited by: None identified  Goals    . Increase physical activity     Starting 04/01/2017, I will continue to exercise for 20-60 minutes 3 days per week.        Fall Risk Fall Risk  04/01/2017 03/31/2016 03/28/2015 02/13/2014 02/09/2013  Falls in the past year? No No No No Yes  Number falls in past yr: - - - - 2 or more  Risk Factor Category  - - - - High Fall Risk   Depression Screen PHQ 2/9 Scores 04/01/2017 03/31/2016 03/28/2015 02/13/2014  PHQ - 2 Score 0 0 0 0  PHQ- 9 Score 0 - - -     Cognitive Function MMSE - Mini Mental State Exam 04/01/2017 03/31/2016 03/28/2015  Orientation to time _9 Orientation to Place _10 Registration _11 Attention/ Calculation 0 0 0  Recall _12 Language- name 2 objects 0 0 0  Language- repeat _13 Language- follow 3 step command _14 Language- read & follow direction 0 0 0  Write a sentence 0 0 0  Copy design 0 0 0  Total score _15 PLEASE NOTE: A Mini-Cog screen was completed. Maximum score is 20. A value of 0 denotes this part of Folstein MMSE was not completed or the patient failed this part of the Mini-Cog screening.   Mini-Cog Screening Orientation to Time - Max 5 pts Orientation to Place - Max 5 pts Registration - Max 3 pts Recall - Max 3 pts Language Repeat - Max 1 pts Language Follow 3 Step Command - Max 3 pts    Immunization History  Administered Date(s) Administered  . DTaP 04/06/2013  . Influenza Split 10/22/2010  . Influenza Whole  10/10/2009, 10/20/2011  . Influenza, High Dose Seasonal PF 10/14/2016  . Influenza, Seasonal, Injecte, Preservative Fre 10/06/2012  . Influenza,inj,Quad PF,6+ Mos 10/30/2014  . Influenza-Unspecified 10/26/2013, 10/22/2015  . Pneumococcal Conjugate-13 02/13/2014  . Pneumococcal Polysaccharide-23 06/12/2010, 03/28/2015  . Td 02/07/2003, 03/23/2013  . Tdap 03/23/2013  . Zoster  05/08/2014    Screening Tests Health Maintenance  Topic Date Due  . MAMMOGRAM  01/15/2018  . COLONOSCOPY  01/13/2023  . TETANUS/TDAP  03/24/2023  . INFLUENZA VACCINE  Completed  . DEXA SCAN  Completed  . Hepatitis C Screening  Completed  . PNA vac Low Risk Adult  Completed       Plan:    I have personally reviewed, addressed, and noted the following in the patient's chart:  A. Medical and social history B. Use of alcohol, tobacco or illicit drugs  C. Current medications and supplements D. Functional ability and status E.  Nutritional status F.  Physical activity G. Advance directives H. List of other physicians I.  Hospitalizations, surgeries, and ER visits in previous 12 months J.  [place] to include hearing, vision, cognitive, depression L. Referrals and appointments - none  In addition, I have reviewed and discussed with patient certain preventive protocols, quality metrics, and best practice recommendations. A written personalized care plan for preventive services as well as general preventive health recommendations were provided to patient.  See attached scanned questionnaire for additional information.   Signed,   Lindell Noe, MHA, BS, LPN Health Coach

## 2017-04-01 NOTE — Patient Instructions (Signed)
Deanna Norris , Thank you for taking time to come for your Medicare Wellness Visit. I appreciate your ongoing commitment to your health goals. Please review the following plan we discussed and let me know if I can assist you in the future.   These are the goals we discussed: Goals    . Increase physical activity     Starting 04/01/2017, I will continue to exercise for 20-60 minutes 3 days per week.        This is a list of the screening recommended for you and due dates:  Health Maintenance  Topic Date Due  . Mammogram  01/15/2018  . Colon Cancer Screening  01/13/2023  . Tetanus Vaccine  03/24/2023  . Flu Shot  Completed  . DEXA scan (bone density measurement)  Completed  .  Hepatitis C: One time screening is recommended by Center for Disease Control  (CDC) for  adults born from [birth year] through 1965.   Completed  . Pneumonia vaccines  Completed   Preventive Care for Adults  A healthy lifestyle and preventive care can promote health and wellness. Preventive health guidelines for adults include the following key practices.  . A routine yearly physical is a good way to check with your health care provider about your health and preventive screening. It is a chance to share any concerns and updates on your health and to receive a thorough exam.  . Visit your dentist for a routine exam and preventive care every 6 months. Brush your teeth twice a day and floss once a day. Good oral hygiene prevents tooth decay and gum disease.  . The frequency of eye exams is based on your age, health, family medical history, use  of contact lenses, and other factors. Follow your health care provider's recommendations for frequency of eye exams.  . Eat a healthy diet. Foods like vegetables, fruits, whole grains, low-fat dairy products, and lean protein foods contain the nutrients you need without too many calories. Decrease your intake of foods high in solid fats, added sugars, and salt. Eat the right amount of  calories for you. Get information about a proper diet from your health care provider, if necessary.  . Regular physical exercise is one of the most important things you can do for your health. Most adults should get at least 150 minutes of moderate-intensity exercise (any activity that increases your heart rate and causes you to sweat) each week. In addition, most adults need muscle-strengthening exercises on 2 or more days a week.  Silver Sneakers may be a benefit available to you. To determine eligibility, you may visit the website: www.silversneakers.com or contact program at (769)315-0064 Mon-Fri between 8AM-8PM.   . Maintain a healthy weight. The body mass index (BMI) is a screening tool to identify possible weight problems. It provides an estimate of body fat based on height and weight. Your health care provider can find your BMI and can help you achieve or maintain a healthy weight.   For adults 20 years and older: ? A BMI below 18.5 is considered underweight. ? A BMI of 18.5 to 24.9 is normal. ? A BMI of 25 to 29.9 is considered overweight. ? A BMI of 30 and above is considered obese.   . Maintain normal blood lipids and cholesterol levels by exercising and minimizing your intake of saturated fat. Eat a balanced diet with plenty of fruit and vegetables. Blood tests for lipids and cholesterol should begin at age 5 and be repeated every 5 years.  If your lipid or cholesterol levels are high, you are over 50, or you are at high risk for heart disease, you may need your cholesterol levels checked more frequently. Ongoing high lipid and cholesterol levels should be treated with medicines if diet and exercise are not working.  . If you smoke, find out from your health care provider how to quit. If you do not use tobacco, please do not start.  . If you choose to drink alcohol, please do not consume more than 2 drinks per day. One drink is considered to be 12 ounces (355 mL) of beer, 5 ounces  (148 mL) of wine, or 1.5 ounces (44 mL) of liquor.  . If you are 35-2 years old, ask your health care provider if you should take aspirin to prevent strokes.  . Use sunscreen. Apply sunscreen liberally and repeatedly throughout the day. You should seek shade when your shadow is shorter than you. Protect yourself by wearing long sleeves, pants, a wide-brimmed hat, and sunglasses year round, whenever you are outdoors.  . Once a month, do a whole body skin exam, using a mirror to look at the skin on your back. Tell your health care provider of new moles, moles that have irregular borders, moles that are larger than a pencil eraser, or moles that have changed in shape or color.

## 2017-04-02 NOTE — Progress Notes (Signed)
I reviewed health advisor's note, was available for consultation, and agree with documentation and plan.  

## 2017-04-08 ENCOUNTER — Encounter: Payer: Self-pay | Admitting: Family Medicine

## 2017-04-08 ENCOUNTER — Ambulatory Visit (INDEPENDENT_AMBULATORY_CARE_PROVIDER_SITE_OTHER): Payer: Medicare Other | Admitting: Family Medicine

## 2017-04-08 VITALS — BP 128/66 | HR 70 | Temp 97.7°F | Ht 65.75 in | Wt 171.2 lb

## 2017-04-08 DIAGNOSIS — I2510 Atherosclerotic heart disease of native coronary artery without angina pectoris: Secondary | ICD-10-CM

## 2017-04-08 DIAGNOSIS — E7800 Pure hypercholesterolemia, unspecified: Secondary | ICD-10-CM

## 2017-04-08 DIAGNOSIS — L719 Rosacea, unspecified: Secondary | ICD-10-CM | POA: Diagnosis not present

## 2017-04-08 DIAGNOSIS — M810 Age-related osteoporosis without current pathological fracture: Secondary | ICD-10-CM

## 2017-04-08 DIAGNOSIS — M0579 Rheumatoid arthritis with rheumatoid factor of multiple sites without organ or systems involvement: Secondary | ICD-10-CM

## 2017-04-08 DIAGNOSIS — M47816 Spondylosis without myelopathy or radiculopathy, lumbar region: Secondary | ICD-10-CM

## 2017-04-08 DIAGNOSIS — M3500 Sicca syndrome, unspecified: Secondary | ICD-10-CM | POA: Diagnosis not present

## 2017-04-08 NOTE — Patient Instructions (Addendum)
For cholesterol  Avoid red meat/ fried foods/ egg yolks/ fatty breakfast meats/ butter, cheese and high fat dairy/ and shellfish    Take care of yourself Keep exercising  We will send for your last mammogram report

## 2017-04-08 NOTE — Progress Notes (Signed)
Subjective:    Patient ID: Deanna Norris, female    DOB: 1945-10-28, 72 y.o.   MRN: [id number]  HPI Here for annual f/u of chronic health problems   Has been feeling well  Just got back from [facility]- good trip   Had amw on 3/27 Missed highest tone on hearing screen bilat Is having some problems- went to audiology - will get hearing aides when ready   Wt Readings from Last 3 Encounters:  04/08/17 171 lb 4 oz (77.7 kg)  04/01/17 168 lb 4 oz (76.3 kg)  03/03/17 168 lb (76.2 kg)  up a few lbs  In general diet is very good  Working on exercise - walking and yoga and water aerobic  27.85 kg/m   Mammogram 1/19-nl per pt  Self breast exam -no lumps Sister had breast cancer   Last visit to gyn- in the fall   Zoster status - expressed intereste in shingrix when available   Colonoscopy 1/15 10 y recall  Has chronic constipation   dexa 1/18-worsened OP at [facility]  Was on bisphosphenate year ago for full course  Hx of gerd  Rheumatologist was going to initiate Reclast -she is now on this  No falls/fx   Has pain if she sits for long periods of time - pain goes down her R leg  Hx of spondylolithesis 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   Hx of melanoma  Derm f/u  Nothing new-is up to date  She wears her sunscreen   RA/ sjogrens - doing very well right now on MTX and Orencia   Hyperlipidemia Lab Results  Component Value Date   CHOL 220 (H) 04/01/2017   CHOL 196 03/24/2016   CHOL 211 (H) 03/23/2015   Lab Results  Component Value Date   HDL 67.30 04/01/2017   HDL 64.40 03/24/2016   HDL 65.60 03/23/2015   Lab Results  Component Value Date   LDLCALC 131 (H) 04/01/2017   LDLCALC 115 (H) 03/24/2016   LDLCALC 119 (H) 03/23/2015   Lab Results  Component Value Date   TRIG 105.0 04/01/2017   TRIG 85.0 03/24/2016   TRIG 133.0 03/23/2015   Lab Results  Component Value Date   CHOLHDL 3 04/01/2017   CHOLHDL 3 03/24/2016   CHOLHDL 3 03/23/2015   Lab Results  Component Value Date   LDLDIRECT  144.2 02/02/2013   LDLDIRECT 137.3 02/02/2012   goal is to get LDL below 130  Up a bit  Good HDL  She occ eats red meat No fried foods   Lab Results  Component Value Date   CREATININE 0.65 03/03/2017   BUN 15 03/03/2017   NA 140 03/03/2017   K 4.8 03/03/2017   CL 104 03/03/2017   CO2 29 03/03/2017  glucose 81 Lab Results  Component Value Date   WBC 7.7 03/03/2017   HGB 15.7 (H) 03/03/2017   HCT 47.0 (H) 03/03/2017   MCV 95.5 03/03/2017   PLT 261 03/03/2017    Lab Results  Component Value Date   TSH 2.38 04/01/2017     Patient Active Problem List   Diagnosis Date Noted  . 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 Primary osteoarthritis of both hands 01/11/2016  . 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/11/2016  . High risk medication use 11/15/2015  . Sjogren's syndrome ([place]) 10/31/2015  . Chronic constipation 10/23/2015  . Rosacea 10/23/2015  . Rheumatoid arthritis ([place]) 03/29/2015  . Coronary artery calcification seen on CT scan  11/17/2013  . Recurrent sinusitis 09/16/2013  . Encounter for Medicare annual wellness exam 01/31/2013  . Dyspnea 09/14/2012  . Nodule of right lung 09/14/2012  . 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 Hyperlipidemia 05/08/2009  . 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 ASTHMA 05/08/2009  . GERD 05/08/2009  . MENOPAUSAL DISORDER 05/08/2009  . ARTHRITIS, RHEUMATOID 05/08/2009  . Osteoporosis 05/08/2009  . SPONDYLOLISTHESIS 05/08/2009  . Sleep apnea 05/08/2009   Past Medical History:  Diagnosis Date  . Asthma   . Fibroid   . GERD (gastroesophageal reflux disease)   . Insomnia   . Menopausal symptoms   . Osteoporosis   . Postmenopausal HRT (hormone replacement therapy)   . RA (rheumatoid arthritis) ([place]) 11/2004   methotrexate and Embrel  . Sleep apnea 2008   C-Pap  . Vitamin D deficiency    Past Surgical History:  Procedure Laterality Date  . ABDOMINAL HYSTERECTOMY  01/1993   partial with AP repair,  secondary to fibroids  . BREAST SURGERY  1991   breast biopsy  . CYSTOCELE REPAIR  10/2007   with graft  . MELANOMA EXCISION Right 01/2012   excision right posterior arm  . NASAL SINUS SURGERY  07/1991  . VAGINAL DELIVERY     x3   Social History   Tobacco Use  . Smoking status: Never Smoker  . Smokeless tobacco: Never Used  Substance Use Topics  . Alcohol use: No    Alcohol/week: 0.0 oz  . Drug use: No   Family History  Problem Relation Age of Onset  . Breast cancer Sister 55  . Thyroid disease Sister   . Cancer Sister 47       Leukemia--CLL   Allergies  Allergen Reactions  . Adalimumab     REACTION: frequent sinus infection  . Diclofenac     unk  . Dicyclomine     Turn teeth gray  . Doxycycline Hyclate Other (See Comments)    "turns teeth gray"  . Fosamax [Alendronate Sodium]     Acid reflux  . Methotrexate Derivatives Nausea Only    Oral metotrexate  . Nickel   . Piroxicam     unk  . Salagen [Pilocarpine Hcl] Other (See Comments)    Excessive sweating, headaches  . Sulfamethoxazole-Trimethoprim     REACTION: face and hands rash and felt burning sensation  . Tramadol Hcl     REACTION: nausea  . Plaquenil [Hydroxychloroquine] Rash   Current Outpatient Medications on File Prior to Visit  Medication Sig Dispense Refill  . acetaminophen (TYLENOL) 500 MG tablet Take 1,000 mg by mouth every 6 (six) hours as needed.    Marland Kitchen albuterol (PROAIR HFA) 108 (90 BASE) MCG/ACT inhaler Inhale 2 puffs into the lungs every 4 (four) hours as needed. 1 Inhaler 3  . aspirin 81 MG tablet Take 81 mg by mouth daily.     . Calcium Carbonate-Vitamin D (CALCIUM 500/D PO) Take 1 tablet by mouth 2 (two) times daily.    . Cholecalciferol (VITAMIN D3) 2000 UNITS TABS Take 1 tablet by mouth daily.      Marland Kitchen desonide (DESOWEN) 0.05 % lotion Apply topically as needed.    . diclofenac sodium (VOLTAREN) 1 % GEL Apply 1 application topically at bedtime as needed.     Mariane Baumgarten Calcium (STOOL  SOFTENER PO) Take 1 capsule by mouth 2 (two) times daily.    . Estradiol 10 MCG TABS vaginal tablet Place 1 tablet (10 mcg total) vaginally 2 (two) times a week. 24  tablet 3  . Flaxseed, Linseed, (FLAX SEED OIL) 1000 MG CAPS Take by mouth daily.    . fluticasone (FLONASE) 50 MCG/ACT nasal spray USE 2 SPRAYS IN EACH NOSTRIL DAILY AS NEEDED FOR RHINITIS OR ALLERGIES (Patient taking differently: daily. USE 2 SPRAYS IN EACH NOSTRIL DAILY AS NEEDED FOR RHINITIS OR ALLERGIES) 48 g 3  . folic acid (FOLVITE) 1 MG tablet Take 2 tablets (2 mg total) by mouth daily. 180 tablet 3  . hydrocortisone 2.5 % ointment Apply 1 application topically as needed.     . Ibuprofen 200 MG CAPS Take 3 capsules by mouth as needed.    . Magnesium Malate 1250 (141.7 Mg) MG TABS Take 3 tablets by mouth daily.     . methotrexate 50 MG/2ML injection Inject 0.9 mLs (22.5 mg total) into the skin once a week. 12 mL 0  . metroNIDAZOLE (METROGEL) 0.75 % gel Apply 1 application topically 2 (two) times daily. 45 g 5  . Multiple Vitamins-Minerals (SENIOR MULTIVITAMIN PLUS PO) Take 1 capsule by mouth daily.    . NON FORMULARY CBD cream and oil prn    . ORENCIA 125 MG/ML SOSY Inject one device sub q weekly 12 Syringe 0  . ranitidine (ZANTAC) 300 MG tablet Take 300 mg by mouth 2 (two) times daily.     . Tuberculin-Allergy Syringes 27G X 1/2" 1 ML KIT Inject 1 Syringe into the skin once a week. To be used weekly with methotrexate.    . zoledronic acid (RECLAST) 5 MG/100ML SOLN injection Inject 5 mg into the vein once. Takes yearly    . zolpidem (AMBIEN) 10 MG tablet Take 0.5 tablets (5 mg total) by mouth at bedtime as needed for sleep. (Patient not taking: Reported on 09/30/2016) 90 tablet 0   No current facility-administered medications on file prior to visit.     Review of Systems  Constitutional: Negative for activity change, appetite change, fatigue, fever and unexpected weight change.  HENT: Negative for congestion, ear pain,  rhinorrhea, sinus pressure and sore throat.   Eyes: Negative for pain, redness and visual disturbance.  Respiratory: Negative for cough, shortness of breath and wheezing.   Cardiovascular: Negative for chest pain and palpitations.  Gastrointestinal: Negative for abdominal pain, blood in stool, constipation and diarrhea.  Endocrine: Negative for polydipsia and polyuria.  Genitourinary: Negative for dysuria, frequency and urgency.  Musculoskeletal: Positive for arthralgias and back pain. Negative for gait problem and myalgias.  Skin: Negative for pallor and rash.  Allergic/Immunologic: Negative for environmental allergies.  Neurological: Negative for dizziness, syncope and headaches.  Hematological: Negative for adenopathy. Does not bruise/bleed easily.  Psychiatric/Behavioral: Negative for decreased concentration and dysphoric mood. The patient is not nervous/anxious.        Objective:   Physical Exam  Constitutional: She appears well-developed and well-nourished. No distress.  Well appearing   HENT:  Head: Normocephalic and atraumatic.  Right Ear: External ear normal.  Left Ear: External ear normal.  Nose: Nose normal.  Mouth/Throat: Oropharynx is clear and moist.  Eyes: Pupils are equal, round, and reactive to light. Conjunctivae and EOM are normal. Right eye exhibits no discharge. Left eye exhibits no discharge. No scleral icterus.  Neck: Normal range of motion. Neck supple. No JVD present. Carotid bruit is not present. No thyromegaly present.  Cardiovascular: Normal rate, regular rhythm, normal heart sounds and intact distal pulses. Exam reveals no gallop.  Pulmonary/Chest: Effort normal and breath sounds normal. No respiratory distress. She has no wheezes. She has  no rales.  Abdominal: Soft. Bowel sounds are normal. She exhibits no distension and no mass. There is no tenderness.  Genitourinary:  Genitourinary Comments: Goes to gyn  Musculoskeletal: She exhibits no edema or  tenderness.  No kyphosis   Lymphadenopathy:    She has no cervical adenopathy.  Neurological: She is alert. She has normal reflexes. No cranial nerve deficit. She exhibits normal muscle tone. Coordination normal.  Skin: Skin is warm and dry. No rash noted. No erythema. No pallor.  Solar lentigines diffusely   Psychiatric: She has a normal mood and affect.          Assessment & Plan:   Problem List Items Addressed This Visit      Cardiovascular and Mediastinum   Coronary artery calcification seen on CT scan    emph imp of good cholesterol control        Digestive   Sjogren's syndrome (HCC)    With RA Under rheum care  On MTX and Orencia Rev labs        Musculoskeletal and Integument   Osteoporosis    dexa 1/18  Disc need for calcium/ vitamin D/ wt bearing exercise and bone density test every 2 y to monitor Disc safety/ fracture risk in detail   On reclast from rheumatologist No falls or fx       Rheumatoid arthritis ([place])    On Orencia and MTX Rheumatology  Stable  Also Sjogren's       Rosacea    Metro gel Sun protection       Spondylosis of lumbar region without myelopathy or radiculopathy    More sciatica type symptoms lately  Disc safe exercise         Other   Hyperlipidemia - Primary    Disc goals for lipids and reasons to control them Rev labs with pt Rev low sat fat diet in detail LDL is up slightly  Recommendations made

## 2017-04-09 NOTE — Assessment & Plan Note (Signed)
With RA Under rheum care  On MTX and Orencia Rev labs

## 2017-04-09 NOTE — Assessment & Plan Note (Signed)
More sciatica type symptoms lately  Disc safe exercise

## 2017-04-09 NOTE — Assessment & Plan Note (Signed)
Metro gel Sun protection

## 2017-04-09 NOTE — Assessment & Plan Note (Signed)
emph imp of good cholesterol control

## 2017-04-09 NOTE — Assessment & Plan Note (Signed)
On Orencia and MTX Rheumatology  Stable  Also Sjogren's

## 2017-04-09 NOTE — Assessment & Plan Note (Signed)
dexa 1/18  Disc need for calcium/ vitamin D/ wt bearing exercise and bone density test every 2 y to monitor Disc safety/ fracture risk in detail   On reclast from rheumatologist No falls or fx

## 2017-04-09 NOTE — Assessment & Plan Note (Signed)
Disc goals for lipids and reasons to control them Rev labs with pt Rev low sat fat diet in detail LDL is up slightly  Recommendations made

## 2017-04-15 ENCOUNTER — Telehealth: Payer: Self-pay | Admitting: Rheumatology

## 2017-04-15 MED ORDER — METHOTREXATE SODIUM CHEMO INJECTION 50 MG/2ML: 22.5000 mg | INTRAMUSCULAR | 0 refills | Status: DC

## 2017-04-15 NOTE — Telephone Encounter (Signed)
Patient called requesting prescription refill of Methotrexate injections to be sent to [facility] and prescription refill of Orencia to be sent to [facility].

## 2017-04-15 NOTE — Telephone Encounter (Signed)
Last Visit: 03/03/17 Next visit: 08/11/17 Labs: 03/03/17 Stable  Okay to refill per Dr. Estanislado Pandy

## 2017-04-22 ENCOUNTER — Other Ambulatory Visit: Payer: Self-pay | Admitting: Rheumatology

## 2017-04-23 ENCOUNTER — Telehealth: Payer: Self-pay | Admitting: Rheumatology

## 2017-04-23 NOTE — Telephone Encounter (Signed)
Spoke with [employer]. Patient is allowed to either get a 56 day supply or a 28 day supply. 28 day supply is what they are filling and she has 2 additional refills.   Attempted to contact the patient and left message for patient to call the office.

## 2017-04-23 NOTE — Telephone Encounter (Signed)
error 

## 2017-04-23 NOTE — Telephone Encounter (Signed)
Last Visit: 03/03/17 Next visit: 08/11/17 Labs: 03/03/17 Stable TB Gold: 03/03/17 Neg   Okay to refill per Dr. Estanislado Pandy

## 2017-04-23 NOTE — Telephone Encounter (Signed)
Patient called stating she spoke with [facility] this morning who notified her that her insurance company [employer] will only recognize a 60 day prescription of the [place].  Patient states she was told even if Dr. Estanislado Pandy writes a prescription for 90 days the insurance will only recognize and fill for 60 days.

## 2017-05-15 DIAGNOSIS — H903 Sensorineural hearing loss, bilateral: Secondary | ICD-10-CM | POA: Diagnosis not present

## 2017-06-08 ENCOUNTER — Other Ambulatory Visit: Payer: Self-pay

## 2017-06-08 DIAGNOSIS — Z79899 Other long term (current) drug therapy: Secondary | ICD-10-CM | POA: Diagnosis not present

## 2017-06-08 LAB — CBC WITH DIFFERENTIAL/PLATELET
BASOS ABS: 109 {cells}/uL (ref 0–200)
BASOS PCT: 1.2 %
EOS PCT: 3.1 %
Eosinophils Absolute: 282 cells/uL (ref 15–500)
HEMATOCRIT: 42 % (ref 35.0–45.0)
Hemoglobin: 14.8 g/dL (ref 11.7–15.5)
LYMPHS ABS: 2375 {cells}/uL (ref 850–3900)
MCH: 33.1 pg — ABNORMAL HIGH (ref 27.0–33.0)
MCHC: 35.2 g/dL (ref 32.0–36.0)
MCV: 94 fL (ref 80.0–100.0)
MPV: 10.6 fL (ref 7.5–12.5)
Monocytes Relative: 8.3 %
NEUTROS ABS: 5578 {cells}/uL (ref 1500–7800)
Neutrophils Relative %: 61.3 %
Platelets: 252 10*3/uL (ref 140–400)
RBC: 4.47 10*6/uL (ref 3.80–5.10)
RDW: 14.1 % (ref 11.0–15.0)
Total Lymphocyte: 26.1 %
WBC mixed population: 755 cells/uL (ref 200–950)
WBC: 9.1 10*3/uL (ref 3.8–10.8)

## 2017-06-09 LAB — COMPLETE METABOLIC PANEL WITH GFR
AG RATIO: 1.6 (calc) (ref 1.0–2.5)
ALBUMIN MSPROF: 3.9 g/dL (ref 3.6–5.1)
ALT: 18 U/L (ref 6–29)
AST: 21 U/L (ref 10–35)
Alkaline phosphatase (APISO): 81 U/L (ref 33–130)
BILIRUBIN TOTAL: 0.5 mg/dL (ref 0.2–1.2)
BUN: 15 mg/dL (ref 7–25)
CHLORIDE: 104 mmol/L (ref 98–110)
CO2: 29 mmol/L (ref 20–32)
Calcium: 9.3 mg/dL (ref 8.6–10.4)
Creat: 0.77 mg/dL (ref 0.60–0.93)
GFR, EST AFRICAN AMERICAN: 90 mL/min/{1.73_m2} (ref >=60)
GFR, Est Non African American: 78 mL/min/{1.73_m2} (ref >=60)
GLOBULIN: 2.5 g/dL (ref 1.9–3.7)
Glucose, Bld: 101 mg/dL — ABNORMAL HIGH (ref 65–99)
POTASSIUM: 4.6 mmol/L (ref 3.5–5.3)
SODIUM: 139 mmol/L (ref 135–146)
TOTAL PROTEIN: 6.4 g/dL (ref 6.1–8.1)

## 2017-06-17 ENCOUNTER — Telehealth: Payer: Self-pay | Admitting: Rheumatology

## 2017-06-17 NOTE — Telephone Encounter (Signed)
Patient called stating she would like to schedule a reclast infusion for 6/17 or 6/18 if possible.  Patient states her last infusion was 06/19/16.

## 2017-06-18 DIAGNOSIS — H5203 Hypermetropia, bilateral: Secondary | ICD-10-CM | POA: Diagnosis not present

## 2017-06-18 DIAGNOSIS — H52223 Regular astigmatism, bilateral: Secondary | ICD-10-CM | POA: Diagnosis not present

## 2017-06-18 DIAGNOSIS — H25813 Combined forms of age-related cataract, bilateral: Secondary | ICD-10-CM | POA: Diagnosis not present

## 2017-06-18 DIAGNOSIS — H04123 Dry eye syndrome of bilateral lacrimal glands: Secondary | ICD-10-CM | POA: Diagnosis not present

## 2017-06-22 ENCOUNTER — Other Ambulatory Visit: Payer: Self-pay | Admitting: *Deleted

## 2017-06-22 DIAGNOSIS — M810 Age-related osteoporosis without current pathological fracture: Secondary | ICD-10-CM

## 2017-06-22 NOTE — Telephone Encounter (Signed)
Patient advised reclast orders are in the computer and she can call to schedule the infusion. Provided her with the number to call to schedule.

## 2017-06-29 ENCOUNTER — Ambulatory Visit (HOSPITAL_COMMUNITY)
Admission: RE | Admit: 2017-06-29 | Discharge: 2017-06-29 | Disposition: A | Discharge status: Home / Self Care | Payer: Medicare Other | Source: Ambulatory Visit | Attending: Rheumatology | Admitting: Rheumatology

## 2017-06-29 DIAGNOSIS — M810 Age-related osteoporosis without current pathological fracture: Secondary | ICD-10-CM | POA: Insufficient documentation

## 2017-06-29 MED ORDER — DIPHENHYDRAMINE HCL 25 MG PO CAPS: 25.0000 mg | ORAL_CAPSULE | ORAL | Status: DC

## 2017-06-29 MED ORDER — ACETAMINOPHEN 325 MG PO TABS: 650.0000 mg | ORAL_TABLET | ORAL | Status: DC

## 2017-06-29 MED ORDER — ZOLEDRONIC ACID 5 MG/100ML IV SOLN
INTRAVENOUS | Status: AC
  Filled 2017-06-29: qty 100

## 2017-06-29 MED ORDER — ZOLEDRONIC ACID 5 MG/100ML IV SOLN
5.0000 mg | Freq: Once | INTRAVENOUS | Status: AC
  Administered 2017-06-29: 5 mg via INTRAVENOUS

## 2017-07-08 ENCOUNTER — Telehealth: Payer: Self-pay | Admitting: Rheumatology

## 2017-07-08 MED ORDER — ORENCIA 125 MG/ML ~~LOC~~ SOSY: PREFILLED_SYRINGE | SUBCUTANEOUS | 2 refills | Status: DC

## 2017-07-08 NOTE — Telephone Encounter (Addendum)
Last Visit: 03/03/17 Next visit: 08/11/17 Labs: 06/08/17 Glucose 101. All other labs are WNL. Tb Gold: 03/03/17   Okay to refill per Dr. Estanislado Pandy

## 2017-07-08 NOTE — Telephone Encounter (Signed)
Patient called requesting prescription refill of Orencia to be sent to [facility].  Patient states they need the prescription written one month at a time with two refills.

## 2017-07-29 NOTE — Progress Notes (Signed)
Office Visit Note  Patient: Deanna Norris             Date of Birth: Sep 12, 1945           MRN: [id number]             PCP: Deanna Greenspan, MD Referring: Norris, Deanna Fanny, MD Visit Date: 08/11/2017 Occupation: @GUAROCC @  Subjective:  Pain in multiple joints  History of Present Illness: Deanna Norris is a 72 y.o. female with history of seropositive rheumatoid arthritis, Sjogren's syndrome, osteoarthritis, and osteoporosis.  Patient is on Orencia subcutaneous injections once weekly, methotrexate 0.9 subcutaneous injections once weekly and folic acid 2 mg daily.  She has not missed any doses recently.  She does not feel as though these medications have been as effective.  She hs tried Enbrel and Humira in the past.  She had an allergy to PLQ.  She states she is due for her next injections tomorrow but feels like she has a sinus infection since last Thursday.  She denies any recent fevers, but she reports congestion and a sore throat.   She has been having increased pain in multiple joints including bilateral hands, bilateral shoulders, bilateral knees, bilateral feet.  She has been taking ibuprofen for pain relief.  She reports that Tylenol does not help her pain.  She is also been having increased pain and stiffness in her neck.  She denies any joint swelling.  She is been having some muscle aches in her upper extremities but denies any muscle weakness.  She has been having initial bursitis bilaterally but has not been performing her stretching exercises on a regular basis.  She continues have sicca symptoms and uses Biotene products as well as eyedrops on a daily basis.  She would like to reapply for Euflexxa injections of bilateral knee joints.  Activities of Daily Living:  Patient reports morning stiffness for 10 minutes.   Patient Reports nocturnal pain.  Difficulty dressing/grooming: Denies Difficulty climbing stairs: Reports Difficulty getting out of chair: Denies Difficulty using  hands for taps, buttons, cutlery, and/or writing: Reports  Review of Systems  Constitutional: Positive for fatigue.  HENT: Positive for mouth dryness. Negative for mouth sores and nose dryness.   Eyes: Positive for dryness. Negative for pain and visual disturbance.  Respiratory: Negative for cough, hemoptysis, shortness of breath and difficulty breathing.   Cardiovascular: Negative for chest pain, palpitations, hypertension and swelling in legs/feet.  Gastrointestinal: Positive for constipation. Negative for blood in stool and diarrhea.  Endocrine: Negative for increased urination.  Genitourinary: Negative for difficulty urinating and painful urination.  Musculoskeletal: Positive for arthralgias, joint pain, muscle weakness, morning stiffness and muscle tenderness. Negative for joint swelling, myalgias and myalgias.  Skin: Negative for color change, pallor, rash, hair loss, nodules/bumps, skin tightness, ulcers and sensitivity to sunlight.  Allergic/Immunologic: Negative for susceptible to infections.  Neurological: Negative for dizziness, numbness and headaches.  Hematological: Negative for bruising/bleeding tendency and swollen glands.  Psychiatric/Behavioral: Positive for sleep disturbance. Negative for depressed mood. The patient is not nervous/anxious.     PMFS History:  Patient Active Problem List   Diagnosis Date Noted  . 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 Primary osteoarthritis of both hands 01/11/2016  . 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/11/2016  . High risk medication use 11/15/2015  . Sjogren's syndrome ([place]) 10/31/2015  . Chronic constipation 10/23/2015  . Rosacea 10/23/2015  . Rheumatoid arthritis ([place]) 03/29/2015  . Coronary artery calcification  seen on CT scan 11/17/2013  . Recurrent sinusitis 09/16/2013  . Encounter for Medicare annual wellness exam 01/31/2013  . Dyspnea 09/14/2012  . Nodule of right lung 09/14/2012    . Hyperlipidemia 05/08/2009  . 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 ASTHMA 05/08/2009  . GERD 05/08/2009  . MENOPAUSAL DISORDER 05/08/2009  . ARTHRITIS, RHEUMATOID 05/08/2009  . Osteoporosis 05/08/2009  . SPONDYLOLISTHESIS 05/08/2009  . Sleep apnea 05/08/2009    Past Medical History:  Diagnosis Date  . Asthma   . Fibroid   . GERD (gastroesophageal reflux disease)   . Insomnia   . Menopausal symptoms   . Osteoporosis   . Postmenopausal HRT (hormone replacement therapy)   . RA (rheumatoid arthritis) ([place]) 11/2004   methotrexate and Embrel  . Sjogren's disease ([place])   . Sleep apnea 2008   C-Pap  . Vitamin D deficiency     Family History  Problem Relation Age of Onset  . Breast cancer Sister 36  . Thyroid disease Sister   . Cancer Sister 36       Leukemia--CLL  . Heart disease Brother    Past Surgical History:  Procedure Laterality Date  . ABDOMINAL HYSTERECTOMY  01/1993   partial with AP repair, secondary to fibroids  . BREAST SURGERY  1991   breast biopsy  . CYSTOCELE REPAIR  10/2007   with graft  . MELANOMA EXCISION Right 01/2012   excision right posterior arm  . NASAL SINUS SURGERY  07/1991  . VAGINAL DELIVERY     x3   Social History   Social History Narrative  . Not on file    Objective: Vital Signs: BP 121/64 (BP Location: Left Arm, Patient Position: Sitting, Cuff Size: Normal)   Pulse 63   Resp 16   Wt 175 lb (79.4 kg)   LMP 01/06/1993   HC 66" (167.6 cm)   BMI 28.25 kg/m    Physical Exam  Constitutional: She is oriented to person, place, and time. She appears well-developed and well-nourished.  HENT:  Head: Normocephalic and atraumatic.  Eyes: Conjunctivae and EOM are normal.  Neck: Normal range of motion.  Cardiovascular: Normal rate, regular rhythm, normal heart sounds and intact distal pulses.  Pulmonary/Chest: Effort normal and breath sounds normal.  Abdominal: Soft. Bowel sounds are normal.  Lymphadenopathy:    She has no cervical  adenopathy.  Neurological: She is alert and oriented to person, place, and time.  Skin: Skin is warm and dry. Capillary refill takes less than 2 seconds.  Psychiatric: She has a normal mood and affect. Her behavior is normal.  Nursing note and vitals reviewed.    Musculoskeletal Exam: C-spine limited range of motion with discomfort.  Lumbar spine good range of motion.  No midline spinal tenderness.  No SI joint tenderness.  Tenderness bilateral ischial tuberosities.  No tenderness of trochanter bursa bilaterally.  Full range of motion bilateral shoulders with discomfort. Bicep muscle tenderness bilaterally.  Elbow joints, wrist joints, MCPs, PIPs, and DIPs good ROM with no synovitis.  She has complete fist formation bilaterally.  She has 2nd and 3rd MCP joint synovial thickening bilaterally.  PIP and DIP synovial thickening consistent with osteoarthritis of both hands.   CDAI Exam: No CDAI exam completed.   Investigation: No additional findings.  Imaging: No results found.  Recent Labs: Lab Results  Component Value Date   WBC 9.1 06/08/2017   HGB 14.8 06/08/2017   PLT 252 06/08/2017   NA 139 06/08/2017   K 4.6 06/08/2017  CL 104 06/08/2017   CO2 29 06/08/2017   GLUCOSE 101 (H) 06/08/2017   BUN 15 06/08/2017   CREATININE 0.77 06/08/2017   BILITOT 0.5 06/08/2017   ALKPHOS 93 06/09/2016   AST 21 06/08/2017   ALT 18 06/08/2017   PROT 6.4 06/08/2017   ALBUMIN 3.6 06/09/2016   CALCIUM 9.3 06/08/2017   GFRAA 90 06/08/2017   QFTBGOLDPLUS NEGATIVE 03/03/2017    Speciality Comments: No specialty comments available.  Procedures:  No procedures performed Allergies: Adalimumab; Diclofenac; Dicyclomine; Doxycycline hyclate; Enbrel [etanercept]; Fosamax [alendronate sodium]; Methotrexate derivatives; Nickel; Piroxicam; Salagen [pilocarpine hcl]; Sulfamethoxazole-trimethoprim; Tramadol hcl; and Plaquenil [hydroxychloroquine]   Assessment / Plan:     Visit Diagnoses: Rheumatoid  arthritis involving multiple sites with positive rheumatoid factor (HCC) - +RF, +CCP, severe erosive: She has no synovitis on exam.  She has synovial thickening of bilateral 2nd and 3rd MCP joints.  She has ulnar deviation of all MCPs on the left hand. She has been having increased pain in bilateral shoulders, bilateral hands, knees, and feet.  Her increased joint pain is most likely due to osteoarthritis.  She given a list of natural anti-inflammatories she can try introducing.  No medication changes will be made at this point.  We will check a sed rate today. She has been injecting Orencia subcutaneously once a week, MTX 0.9 ml sq q wk, and taking folic acid 2 mg po daily.  She has not missed any doses.  She is due for her next injections tomorrow, but she was advised to hold these medications until the sinus infection has resolved.  Refills of [place], folic acid, and MTX were sent to the pharmacy.   - Plan: Sedimentation rate  High risk medication use - Orencia q wk, MTX 0.9 sq q wk, folic acid 2mg  po qd.  CBC and CMP were WNL on 06/08/17.  She will return in September and every 3 months for lab work to monitor for drug toxicity.  TB gold negative 03/03/17.  Standing orders for CBC and CMP are in place.   Sjogren's syndrome, with unspecified organ involvement [facility]): She continues to have sicca symptoms.  She uses biotene products and eye drops on a regular basis.  She has no parotid swelling on exam.  SPEP, RF, UA, CBC, and CMP will be ordered with labs in September.  Future orders were placed.   Primary osteoarthritis of both hands: She has PIP and DIP synovial thickening consistent with osteoarthritis of both hands.  Bilateral CMC joint synovial thickening. She was given a handout of hand exercises that she can perform at home.  She has been taking ibuprofen for pain relief.  Joint protection and muscle strengthening were discussed.  She was given a handout of hand exercises that she can perform at home.   She was given a list of natural anti-inflammatories.   Primary osteoarthritis of both knees -She has good ROM on exam.  No warmth or effusion noted.  She has been having increased pain in both knees. We will reapply for Euflexxa injections of bilateral knee joints. She was given a list of natural anti-inflammatories.    Spondylosis of lumbar region without myelopathy or radiculopathy: Good ROM.  No midline spinal tenderness.  She has no discomfort at this time.   Ischial bursitis, unspecified laterality - Bilateral-We discussed the importance of changing positions frequently.  It was recommended for her to get an ishcial tuberosity cushion.  She will resume exercises on a regular basis as well.  Age-related osteoporosis without current pathological fracture - Reclast IVJune 2019, T-3.4 L femur , BMD 0.467.  She is taking Calicum and Vitamin daily.   Polyarthralgia - She has been having increased pain in multiple joints.  We will check a sed rate today. Plan: Sedimentation rate   Other medical conditions are listed as follows:   History of gastroesophageal reflux (GERD)  History of hyperlipidemia  History of hearing loss  History of rosacea  History of sleep apnea   Orders: Orders Placed This Encounter  Procedures  . Sedimentation rate  . Urinalysis, Routine w reflex microscopic  . Serum protein electrophoresis with reflex  . Rheumatoid factor   Meds ordered this encounter  Medications  . zolpidem (AMBIEN) 10 MG tablet    Sig: Take 0.5 tablets (5 mg total) by mouth at bedtime as needed for sleep.    Dispense:  30 tablet    Refill:  1  . ORENCIA 125 MG/ML SOSY    Sig: INJECT THE CONTENTS OF 1 SYRINGE UNDER THE SKIN EVERY WEEK    Dispense:  4 mL    Refill:  2  . methotrexate 50 MG/2ML injection    Sig: Inject 0.9 mLs (22.5 mg total) into the skin once a week.    Dispense:  12 mL    Refill:  0  . folic acid (FOLVITE) 1 MG tablet    Sig: Take 2 tablets (2 mg total) by  mouth daily.    Dispense:  180 tablet    Refill:  3    Face-to-face time spent with patient was 30 minutes. Greater than 50% of time was spent in counseling and coordination of care.  Follow-Up Instructions: Return in about 5 months (around 01/11/2018) for Rheumatoid arthritis, Sjogren's syndrome, Osteoarthritis, Osteoporosis.   Ofilia Neas, PA-C I examined and evaluated the patient with Hazel Sams PA.  Patient was accompanied by her husband today.  She has synovial thickening on examination but no synovitis was noted.  She has been experiencing increased arthralgias.  Will obtain sedimentation rate today.  She had multiple questions which were answered at length.  The plan of care was discussed as noted above.  Bo Merino, MD Note - This record has been created using Editor, commissioning.  Chart creation errors have been sought, but may not always  have been located. Such creation errors do not reflect on  the standard of medical care.

## 2017-08-11 ENCOUNTER — Ambulatory Visit (INDEPENDENT_AMBULATORY_CARE_PROVIDER_SITE_OTHER): Payer: Medicare Other | Admitting: Rheumatology

## 2017-08-11 ENCOUNTER — Encounter: Payer: Self-pay | Admitting: Rheumatology

## 2017-08-11 ENCOUNTER — Telehealth: Payer: Self-pay

## 2017-08-11 VITALS — BP 121/64 | HR 63 | Resp 16 | Wt 175.0 lb

## 2017-08-11 DIAGNOSIS — Z8719 Personal history of other diseases of the digestive system: Secondary | ICD-10-CM

## 2017-08-11 DIAGNOSIS — Z8639 Personal history of other endocrine, nutritional and metabolic disease: Secondary | ICD-10-CM | POA: Diagnosis not present

## 2017-08-11 DIAGNOSIS — M47816 Spondylosis without myelopathy or radiculopathy, lumbar region: Secondary | ICD-10-CM | POA: Diagnosis not present

## 2017-08-11 DIAGNOSIS — M7070 Other bursitis of hip, unspecified hip: Secondary | ICD-10-CM

## 2017-08-11 DIAGNOSIS — M2550 Pain in unspecified joint: Secondary | ICD-10-CM | POA: Diagnosis not present

## 2017-08-11 DIAGNOSIS — Z8669 Personal history of other diseases of the nervous system and sense organs: Secondary | ICD-10-CM

## 2017-08-11 DIAGNOSIS — I2510 Atherosclerotic heart disease of native coronary artery without angina pectoris: Secondary | ICD-10-CM | POA: Diagnosis not present

## 2017-08-11 DIAGNOSIS — M170 Bilateral primary osteoarthritis of knee: Secondary | ICD-10-CM

## 2017-08-11 DIAGNOSIS — M19042 Primary osteoarthritis, left hand: Secondary | ICD-10-CM

## 2017-08-11 DIAGNOSIS — M810 Age-related osteoporosis without current pathological fracture: Secondary | ICD-10-CM | POA: Diagnosis not present

## 2017-08-11 DIAGNOSIS — M19041 Primary osteoarthritis, right hand: Secondary | ICD-10-CM | POA: Diagnosis not present

## 2017-08-11 DIAGNOSIS — M3500 Sicca syndrome, unspecified: Secondary | ICD-10-CM | POA: Diagnosis not present

## 2017-08-11 DIAGNOSIS — Z79899 Other long term (current) drug therapy: Secondary | ICD-10-CM | POA: Diagnosis not present

## 2017-08-11 DIAGNOSIS — Z872 Personal history of diseases of the skin and subcutaneous tissue: Secondary | ICD-10-CM

## 2017-08-11 DIAGNOSIS — M0579 Rheumatoid arthritis with rheumatoid factor of multiple sites without organ or systems involvement: Secondary | ICD-10-CM

## 2017-08-11 LAB — SEDIMENTATION RATE: SED RATE: 11 mm/h (ref 0–30)

## 2017-08-11 MED ORDER — METHOTREXATE SODIUM CHEMO INJECTION 50 MG/2ML: 22.5000 mg | INTRAMUSCULAR | 0 refills | Status: DC

## 2017-08-11 MED ORDER — ZOLPIDEM TARTRATE 10 MG PO TABS: 5.0000 mg | ORAL_TABLET | Freq: Every evening | ORAL | 1 refills | Status: DC | PRN

## 2017-08-11 MED ORDER — ORENCIA 125 MG/ML ~~LOC~~ SOSY: PREFILLED_SYRINGE | SUBCUTANEOUS | 2 refills | Status: DC

## 2017-08-11 MED ORDER — FOLIC ACID 1 MG PO TABS: 2.0000 mg | ORAL_TABLET | Freq: Every day | ORAL | 3 refills | Status: DC

## 2017-08-11 NOTE — Patient Instructions (Addendum)
Standing Labs We placed an order today for your standing lab work.    Please come back and get your standing labs in September and every 3 months   We have open lab Monday through Friday from 8:30-11:30 AM and 1:30-4:00 PM  at the office of Dr. Bo Merino.   You may experience shorter wait times on Monday and Friday afternoons. The office is located at [address], [place], [place], [place] [address] No appointment is necessary.   Labs are drawn by [facility].  You may receive a bill from [facility] for your lab work. If you have any questions regarding directions or hours of operation,  please call [phone].    Natural anti-inflammatories  You can purchase these at [employer], [employer] or online.  . Turmeric (capsules)  . Ginger (ginger root or capsules)  . Omega 3 (Fish, flax seeds, chia seeds, walnuts, almonds)  . Tart cherry (dried or extract)   Patient should be under the care of a physician while taking these supplements. This may not be reproduced without the permission of Dr. Bo Merino.  Hand Exercises Hand exercises can be helpful to almost anyone. These exercises can strengthen the hands, improve flexibility and movement, and increase blood flow to the hands. These results can make work and daily tasks easier. Hand exercises can be especially helpful for people who have joint pain from arthritis or have nerve damage from overuse (carpal tunnel syndrome). These exercises can also help people who have injured a hand. Most of these hand exercises are fairly gentle stretching routines. You can do them often throughout the day. Still, it is a good idea to ask your health care provider which exercises would be best for you. Warming your hands before exercise may help to reduce stiffness. You can do this with gentle massage or by placing your hands in warm water for 15 minutes. Also, make sure you pay attention to your level of hand pain as you begin an exercise  routine. Exercises Knuckle Bend Repeat this exercise 5-10 times with each hand. 1. Stand or sit with your arm, hand, and all five fingers pointed straight up. Make sure your wrist is straight. 2. Gently and slowly bend your fingers down and inward until the tips of your fingers are touching the tops of your palm. 3. Hold this position for a few seconds. 4. Extend your fingers out to their original position, all pointing straight up again.  Finger Fan Repeat this exercise 5-10 times with each hand. 1. Hold your arm and hand out in front of you. Keep your wrist straight. 2. Squeeze your hand into a fist. 3. Hold this position for a few seconds. 4. Edison Simon out, or spread apart, your hand and fingers as much as possible, stretching every joint fully.  Tabletop Repeat this exercise 5-10 times with each hand. 1. Stand or sit with your arm, hand, and all five fingers pointed straight up. Make sure your wrist is straight. 2. Gently and slowly bend your fingers at the knuckles where they meet the hand until your hand is making an upside-down L shape. Your fingers should form a tabletop. 3. Hold this position for a few seconds. 4. Extend your fingers out to their original position, all pointing straight up again.  Making Os Repeat this exercise 5-10 times with each hand. 1. Stand or sit with your arm, hand, and all five fingers pointed straight up. Make sure your wrist is straight. 2. Make an O shape by  touching your pointer finger to your thumb. Hold for a few seconds. Then open your hand wide. 3. Repeat this motion with each finger on your hand.  Table Spread Repeat this exercise 5-10 times with each hand. 1. Place your hand on a table with your palm facing down. Make sure your wrist is straight. 2. Spread your fingers out as much as possible. Hold this position for a few seconds. 3. Slide your fingers back together again. Hold for a few seconds.  Ball Grip  Repeat this exercise 10-15 times  with each hand. 1. Hold a tennis ball or another soft ball in your hand. 2. While slowly increasing pressure, squeeze the ball as hard as possible. 3. Squeeze as hard as you can for 3-5 seconds. 4. Relax and repeat.  Wrist Curls Repeat this exercise 10-15 times with each hand. 1. Sit in a chair that has armrests. 2. Hold a light weight in your hand, such as a dumbbell that weighs 1-3 pounds (0.5-1.4 kg). Ask your health care provider what weight would be best for you. 3. Rest your hand just over the end of the chair arm with your palm facing up. 4. Gently pivot your wrist up and down while holding the weight. Do not twist your wrist from side to side.  Contact a health care provider if:  Your hand pain or discomfort gets much worse when you do an exercise.  Your hand pain or discomfort does not improve within 2 hours after you exercise. If you have any of these problems, stop doing these exercises right away. Do not do them again unless your health care provider says that you can. Get help right away if:  You develop sudden, severe hand pain. If this happens, stop doing these exercises right away. Do not do them again unless your health care provider says that you can. This information is not intended to replace advice given to you by your health care provider. Make sure you discuss any questions you have with your health care provider. Document Released: 12/04/2014 Document Revised: 05/31/2015 Document Reviewed: 07/03/2014 Elsevier Interactive Patient Education  [employer].

## 2017-08-11 NOTE — Telephone Encounter (Signed)
Please apply for bilateral knee euflexxa injections, per Lovena Le. Thanks!

## 2017-08-12 ENCOUNTER — Encounter: Payer: Self-pay | Admitting: Family Medicine

## 2017-08-12 ENCOUNTER — Ambulatory Visit (INDEPENDENT_AMBULATORY_CARE_PROVIDER_SITE_OTHER): Payer: Medicare Other | Admitting: Family Medicine

## 2017-08-12 VITALS — BP 110/70 | HR 63 | Temp 97.9°F | Ht 65.75 in | Wt 175.5 lb

## 2017-08-12 DIAGNOSIS — J0110 Acute frontal sinusitis, unspecified: Secondary | ICD-10-CM | POA: Diagnosis not present

## 2017-08-12 DIAGNOSIS — D849 Immunodeficiency, unspecified: Secondary | ICD-10-CM

## 2017-08-12 DIAGNOSIS — I2510 Atherosclerotic heart disease of native coronary artery without angina pectoris: Secondary | ICD-10-CM | POA: Diagnosis not present

## 2017-08-12 DIAGNOSIS — D899 Disorder involving the immune mechanism, unspecified: Secondary | ICD-10-CM

## 2017-08-12 MED ORDER — AMOXICILLIN-POT CLAVULANATE 875-125 MG PO TABS: 1.0000 | ORAL_TABLET | Freq: Two times a day (BID) | ORAL | 0 refills | Status: AC

## 2017-08-12 NOTE — Progress Notes (Signed)
Dr. Frederico Hamman T. Keagan Brislin, MD, [facility] [place], [address] Phone: [phone] Fax: [phone]  08/12/2017  Patient: Deanna Norris, MRN: [id number], DOB: 1945/01/20, 72 y.o.  Primary Physician:  Tower, [name], MD   Chief Complaint  Patient presents with  . Sinusitis   Subjective:   [name] is a 72 y.o. very pleasant female patient who presents with the following:  Takes MTX and Orencia  Never has fever Breather ok No coughing  Patient is been not feeling well for about the last 7 days, and this is been progressive.  She is having a runny nose as well as some sore throat.  She is not really having much of a cough, and she is having pain and pressure under her eyes and in her frontal sinuses.  There is some pressure in her ears but not frank pain.  She is not running a fever.  Past Medical History, Surgical History, Social History, Family History, Problem List, Medications, and Allergies have been reviewed and updated if relevant.  Patient Active Problem List   Diagnosis Date Noted  . 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 Primary osteoarthritis of both hands 01/11/2016  . 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/11/2016  . High risk medication use 11/15/2015  . Sjogren's syndrome ([place]) 10/31/2015  . Chronic constipation 10/23/2015  . Rosacea 10/23/2015  . Rheumatoid arthritis ([place]) 03/29/2015  . Coronary artery calcification seen on CT scan 11/17/2013  . Recurrent sinusitis 09/16/2013  . Encounter for Medicare annual wellness exam 01/31/2013  . Dyspnea 09/14/2012  . Nodule of right lung 09/14/2012  . Hyperlipidemia 05/08/2009  . 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 ASTHMA 05/08/2009  . GERD 05/08/2009  . MENOPAUSAL DISORDER 05/08/2009  . ARTHRITIS, RHEUMATOID 05/08/2009  . Osteoporosis 05/08/2009  . SPONDYLOLISTHESIS 05/08/2009  . Sleep apnea 05/08/2009      Past Medical History:  Diagnosis Date  . Asthma   . Fibroid   . GERD (gastroesophageal reflux disease)   . Insomnia   . Menopausal symptoms   . Osteoporosis   . Postmenopausal HRT (hormone replacement therapy)   . RA (rheumatoid arthritis) ([place]) 11/2004   methotrexate and Embrel  . Sjogren's disease ([place])   . Sleep apnea 2008   C-Pap  . Vitamin D deficiency     Past Surgical History:  Procedure Laterality Date  . ABDOMINAL HYSTERECTOMY  01/1993   partial with AP repair, secondary to fibroids  . BREAST SURGERY  1991   breast biopsy  . CYSTOCELE REPAIR  10/2007   with graft  . MELANOMA EXCISION Right 01/2012   excision right posterior arm  . NASAL SINUS SURGERY  07/1991  . VAGINAL DELIVERY     x3    Social History   Socioeconomic History  . Marital status: Married    Spouse name: Not on file  . Number of children: Not on file  . Years of education: Not on file  . Highest education level: Not on file  Occupational History  . Occupation: housewife    Employer: Not Employed  Social Needs  . Financial resource strain: Not on file  . Food insecurity:    Worry: Not on file    Inability: Not on file  . Transportation needs:    Medical: Not on file    Non-medical: Not on file  Tobacco Use  . Smoking status: Never Smoker  . Smokeless  tobacco: Never Used  Substance and Sexual Activity  . Alcohol use: No    Alcohol/week: 0.0 oz  . Drug use: No  . Sexual activity: Yes    Partners: Male    Birth control/protection: Surgical    Comment: TAH  Lifestyle  . Physical activity:    Days per week: Not on file    Minutes per session: Not on file  . Stress: Not on file  Relationships  . Social connections:    Talks on phone: Not on file    Gets together: Not on file    Attends religious service: Not on file    Active member of club or organization: Not on file    Attends meetings of clubs or organizations: Not on file    Relationship status: Not on file  .  Intimate partner violence:    Fear of current or ex partner: Not on file    Emotionally abused: Not on file    Physically abused: Not on file    Forced sexual activity: Not on file  Other Topics Concern  . Not on file  Social History Narrative  . Not on file    Family History  Problem Relation Age of Onset  . Breast cancer Sister 72  . Thyroid disease Sister   . Cancer Sister 43       Leukemia--CLL  . Heart disease Brother     Allergies  Allergen Reactions  . Adalimumab     REACTION: frequent sinus infection  . Diclofenac     unk  . Dicyclomine     Turn teeth gray  . Doxycycline Hyclate Other (See Comments)    "turns teeth gray"  . Enbrel [Etanercept]     SINUS INFECTIONS  . Fosamax [Alendronate Sodium]     Acid reflux  . Methotrexate Derivatives Nausea Only    Oral metotrexate  . Nickel   . Piroxicam     unk  . Salagen [Pilocarpine Hcl] Other (See Comments)    Excessive sweating, headaches  . Sulfamethoxazole-Trimethoprim     REACTION: face and hands rash and felt burning sensation  . Tramadol Hcl     REACTION: nausea  . Plaquenil [Hydroxychloroquine] Rash    Medication list reviewed and updated in full in [facility].  ROS: GEN: Acute illness details above GI: Tolerating PO intake GU: maintaining adequate hydration and urination Pulm: No SOB Interactive and getting along well at home.  Otherwise, ROS is as per the HPI.  Objective:   BP 110/70   Pulse 63   Temp 97.9 F (36.6 C) (Oral)   Ht 5' 5.75" (1.67 m)   Wt 175 lb 8 oz (79.6 kg)   LMP 01/06/1993   BMI 28.54 kg/m    Gen: WDWN, NAD; alert,appropriate and cooperative throughout exam    HEENT: Normocephalic and atraumatic. Throat clear, w/o exudate, no LAD, R TM clear, L TM - good landmarks, No fluid present. rhinnorhea.  Left frontal and maxillary sinuses: Tender Right frontal and maxillary sinuses: Tender    Neck: No ant or post LAD  CV: RRR, No M/G/R  Pulm: Breathing  comfortably in no resp distress. no w/c/r  Abd: S,NT,ND,+BS Extr: no c/c/e Psych: full affect, pleasant    Laboratory and Imaging Data:  Assessment and Plan:   Acute non-recurrent frontal sinusitis  Immunocompromised ([place])  Supportive care and abx  Follow-up: No follow-ups on file.  Meds ordered this encounter  Medications  . amoxicillin-clavulanate (AUGMENTIN) 875-125 MG tablet  Sig: Take 1 tablet by mouth 2 (two) times daily for 10 days.    Dispense:  20 tablet    Refill:  0   Signed,  Dorotea Hand T. Suhaas Agena, MD   Allergies as of 08/12/2017      Reactions   Adalimumab    REACTION: frequent sinus infection   Diclofenac    unk   Dicyclomine    Turn teeth gray   Doxycycline Hyclate Other (See Comments)   "turns teeth gray"   Enbrel [etanercept]    SINUS INFECTIONS   Fosamax [alendronate Sodium]    Acid reflux   Methotrexate Derivatives Nausea Only   Oral metotrexate   Nickel    Piroxicam    unk   Salagen [pilocarpine Hcl] Other (See Comments)   Excessive sweating, headaches   Sulfamethoxazole-trimethoprim    REACTION: face and hands rash and felt burning sensation   Tramadol Hcl    REACTION: nausea   Plaquenil [hydroxychloroquine] Rash      Medication List        Accurate as of 08/12/17 11:59 PM. Always use your most recent med list.          albuterol 108 (90 Base) MCG/ACT inhaler Commonly known as:  PROVENTIL HFA;VENTOLIN HFA Inhale 2 puffs into the lungs every 4 (four) hours as needed.   amoxicillin-clavulanate 875-125 MG tablet Commonly known as:  AUGMENTIN Take 1 tablet by mouth 2 (two) times daily for 10 days.   aspirin 81 MG tablet Take 81 mg by mouth daily.   CALCIUM 500/D PO Take 1 tablet by mouth 2 (two) times daily.   desonide 0.05 % lotion Commonly known as:  DESOWEN Apply topically as needed.   diclofenac sodium 1 % Gel Commonly known as:  VOLTAREN Apply 1 application topically at bedtime as needed.   Estradiol 10 MCG Tabs  vaginal tablet Place 1 tablet (10 mcg total) vaginally 2 (two) times a week.   Flax Seed Oil 1000 MG Caps Take by mouth daily.   fluticasone 50 MCG/ACT nasal spray Commonly known as:  FLONASE USE 2 SPRAYS IN EACH NOSTRIL DAILY AS NEEDED FOR RHINITIS OR ALLERGIES   folic acid 1 MG tablet Commonly known as:  FOLVITE Take 2 tablets (2 mg total) by mouth daily.   hydrocortisone 2.5 % ointment Apply 1 application topically as needed.   Ibuprofen 200 MG Caps Take 3 capsules by mouth as needed.   Magnesium Malate 1250 (141.7 Mg) MG Tabs Take 3 tablets by mouth daily.   methotrexate 50 MG/2ML injection Inject 0.9 mLs (22.5 mg total) into the skin once a week.   NON FORMULARY CBD cream and oil prn   ORENCIA 125 MG/ML Sosy Generic drug:  Abatacept INJECT THE CONTENTS OF 1 SYRINGE UNDER THE SKIN EVERY WEEK   ranitidine 300 MG tablet Commonly known as:  ZANTAC Take 300 mg by mouth 2 (two) times daily.   RECLAST 5 MG/100ML Soln injection Generic drug:  zoledronic acid Inject 5 mg into the vein once. Takes yearly   SENIOR MULTIVITAMIN PLUS PO Take 1 capsule by mouth daily.   STOOL SOFTENER PO Take 1 capsule by mouth 2 (two) times daily.   Tuberculin-Allergy Syringes 27G X 1/2" 1 ML Kit Inject 1 Syringe into the skin once a week. To be used weekly with methotrexate.   Vitamin D3 2000 units Tabs Take 1 tablet by mouth daily.   zolpidem 10 MG tablet Commonly known as:  AMBIEN Take 0.5 tablets (5 mg total)  by mouth at bedtime as needed for sleep.

## 2017-08-13 ENCOUNTER — Telehealth (INDEPENDENT_AMBULATORY_CARE_PROVIDER_SITE_OTHER): Payer: Self-pay

## 2017-08-13 NOTE — Telephone Encounter (Signed)
Noted  

## 2017-08-13 NOTE — Telephone Encounter (Signed)
Submitted for VOB, Euflexxa series, bilateral knee.

## 2017-08-17 ENCOUNTER — Telehealth (INDEPENDENT_AMBULATORY_CARE_PROVIDER_SITE_OTHER): Payer: Self-pay

## 2017-08-17 NOTE — Telephone Encounter (Signed)
Please schedule patient appointment with Hazel Sams.  Patient approved for Euflexxa series, bilateral knee. Buy & Bill No co-pay No PA required

## 2017-08-21 ENCOUNTER — Ambulatory Visit (INDEPENDENT_AMBULATORY_CARE_PROVIDER_SITE_OTHER): Payer: Medicare Other | Admitting: Physician Assistant

## 2017-08-21 DIAGNOSIS — M170 Bilateral primary osteoarthritis of knee: Secondary | ICD-10-CM | POA: Diagnosis not present

## 2017-08-21 MED ORDER — SODIUM HYALURONATE (VISCOSUP) 20 MG/2ML IX SOSY
20.0000 mg | PREFILLED_SYRINGE | INTRA_ARTICULAR | Status: AC | PRN
  Administered 2017-08-21: 20 mg via INTRA_ARTICULAR

## 2017-08-21 MED ORDER — LIDOCAINE HCL 1 % IJ SOLN
1.5000 mL | INTRAMUSCULAR | Status: AC | PRN
  Administered 2017-08-21: 1.5 mL

## 2017-08-21 NOTE — Progress Notes (Signed)
   Procedure Note  Patient: Deanna Norris             Date of Birth: [birth year]           MRN: [id number]             Visit Date: 08/21/2017  Procedures: Visit Diagnoses: Primary osteoarthritis of both knees Euflexxa #1 Bilateral knee joint injections  Large Joint Inj: bilateral knee on 08/21/2017 8:43 AM Indications: pain Details: 27 G 1.5 in needle, medial approach  Arthrogram: No  Medications (Right): 20 mg Sodium Hyaluronate 20 MG/2ML; 1.5 mL lidocaine 1 % Aspirate (Right): 0 mL Medications (Left): 20 mg Sodium Hyaluronate 20 MG/2ML; 1.5 mL lidocaine 1 % Aspirate (Left): 0 mL Outcome: tolerated well, no immediate complications Procedure, treatment alternatives, risks and benefits explained, specific risks discussed. Consent was given by the patient. Immediately prior to procedure a time out was called to verify the correct patient, procedure, equipment, support staff and site/side marked as required. Patient was prepped and draped in the usual sterile fashion.    Patient tolerated the procedure well.   Hazel Sams, PA-C

## 2017-08-24 DIAGNOSIS — Z85820 Personal history of malignant melanoma of skin: Secondary | ICD-10-CM | POA: Diagnosis not present

## 2017-08-24 DIAGNOSIS — L812 Freckles: Secondary | ICD-10-CM | POA: Diagnosis not present

## 2017-08-24 DIAGNOSIS — L821 Other seborrheic keratosis: Secondary | ICD-10-CM | POA: Diagnosis not present

## 2017-08-24 DIAGNOSIS — D1801 Hemangioma of skin and subcutaneous tissue: Secondary | ICD-10-CM | POA: Diagnosis not present

## 2017-08-28 ENCOUNTER — Ambulatory Visit (INDEPENDENT_AMBULATORY_CARE_PROVIDER_SITE_OTHER): Payer: Medicare Other | Admitting: Physician Assistant

## 2017-08-28 DIAGNOSIS — M170 Bilateral primary osteoarthritis of knee: Secondary | ICD-10-CM

## 2017-08-28 MED ORDER — LIDOCAINE HCL 1 % IJ SOLN
1.5000 mL | INTRAMUSCULAR | Status: AC | PRN
  Administered 2017-08-28: 1.5 mL

## 2017-08-28 MED ORDER — SODIUM HYALURONATE (VISCOSUP) 20 MG/2ML IX SOSY
20.0000 mg | PREFILLED_SYRINGE | INTRA_ARTICULAR | Status: AC | PRN
  Administered 2017-08-28: 20 mg via INTRA_ARTICULAR

## 2017-08-28 NOTE — Progress Notes (Signed)
   Procedure Note  Patient: Deanna Norris             Date of Birth: May 24, 1945           MRN: [id number]             Visit Date: 08/28/2017  Procedures: Visit Diagnoses: Primary osteoarthritis of both knees Euflexxa #2 bilateral B/B Large Joint Inj: bilateral knee on 08/28/2017 8:29 AM Indications: pain Details: 27 G 1.5 in needle, medial approach  Arthrogram: No  Medications (Right): 20 mg Sodium Hyaluronate 20 MG/2ML; 1.5 mL lidocaine 1 % Aspirate (Right): 0 mL Medications (Left): 20 mg Sodium Hyaluronate 20 MG/2ML; 1.5 mL lidocaine 1 % Aspirate (Left): 0 mL Outcome: tolerated well, no immediate complications Procedure, treatment alternatives, risks and benefits explained, specific risks discussed. Consent was given by the patient. Immediately prior to procedure a time out was called to verify the correct patient, procedure, equipment, support staff and site/side marked as required. Patient was prepped and draped in the usual sterile fashion.     Patient tolerated the procedure well.   Hazel Sams, PA-C

## 2017-09-04 ENCOUNTER — Ambulatory Visit (INDEPENDENT_AMBULATORY_CARE_PROVIDER_SITE_OTHER): Payer: Medicare Other | Admitting: Physician Assistant

## 2017-09-04 DIAGNOSIS — M170 Bilateral primary osteoarthritis of knee: Secondary | ICD-10-CM | POA: Diagnosis not present

## 2017-09-04 MED ORDER — SODIUM HYALURONATE (VISCOSUP) 20 MG/2ML IX SOSY
20.0000 mg | PREFILLED_SYRINGE | INTRA_ARTICULAR | Status: AC | PRN
  Administered 2017-09-04: 20 mg via INTRA_ARTICULAR

## 2017-09-04 MED ORDER — LIDOCAINE HCL 1 % IJ SOLN
1.5000 mL | INTRAMUSCULAR | Status: AC | PRN
  Administered 2017-09-04: 1.5 mL

## 2017-09-04 NOTE — Progress Notes (Signed)
   Procedure Note  Patient: Deanna Norris             Date of Birth: [birth year]           MRN: [id number]             Visit Date: 09/04/2017  Procedures: Visit Diagnoses: Primary osteoarthritis of both knees Euflexxa #3 bilateral B/B Large Joint Inj: bilateral knee on 09/04/2017 10:19 AM Indications: pain Details: 27 G 1.5 in needle, medial approach  Arthrogram: No  Medications (Right): 20 mg Sodium Hyaluronate 20 MG/2ML; 1.5 mL lidocaine 1 % Aspirate (Right): 0 mL Medications (Left): 20 mg Sodium Hyaluronate 20 MG/2ML; 1.5 mL lidocaine 1 % Aspirate (Left): 0 mL Outcome: tolerated well, no immediate complications Procedure, treatment alternatives, risks and benefits explained, specific risks discussed. Consent was given by the patient. Immediately prior to procedure a time out was called to verify the correct patient, procedure, equipment, support staff and site/side marked as required. Patient was prepped and draped in the usual sterile fashion.     Patient tolerated the procedure well.   Hazel Sams, PA-C

## 2017-09-25 ENCOUNTER — Other Ambulatory Visit: Payer: Self-pay

## 2017-09-25 DIAGNOSIS — Z79899 Other long term (current) drug therapy: Secondary | ICD-10-CM | POA: Diagnosis not present

## 2017-09-25 DIAGNOSIS — M3500 Sicca syndrome, unspecified: Secondary | ICD-10-CM

## 2017-09-29 ENCOUNTER — Telehealth: Payer: Self-pay | Admitting: Physician Assistant

## 2017-09-29 LAB — PROTEIN ELECTROPHORESIS, SERUM, WITH REFLEX
ALBUMIN ELP: 4 g/dL (ref 3.8–4.8)
Alpha 1: 0.2 g/dL (ref 0.2–0.3)
Alpha 2: 0.6 g/dL (ref 0.5–0.9)
BETA GLOBULIN: 0.4 g/dL (ref 0.4–0.6)
Beta 2: 0.3 g/dL (ref 0.2–0.5)
Gamma Globulin: 0.9 g/dL (ref 0.8–1.7)
TOTAL PROTEIN: 6.4 g/dL (ref 6.1–8.1)

## 2017-09-29 LAB — URINALYSIS, ROUTINE W REFLEX MICROSCOPIC
BACTERIA UA: NONE SEEN /HPF
Bilirubin Urine: NEGATIVE
Glucose, UA: NEGATIVE
HGB URINE DIPSTICK: NEGATIVE
HYALINE CAST: NONE SEEN /LPF
Ketones, ur: NEGATIVE
Nitrite: NEGATIVE
PH: 6.5 (ref 5.0–8.0)
Protein, ur: NEGATIVE
RBC / HPF: NONE SEEN /HPF (ref 0–2)
Specific Gravity, Urine: 1.013 (ref 1.001–1.03)

## 2017-09-29 LAB — COMPLETE METABOLIC PANEL WITH GFR
AG Ratio: 1.9 (calc) (ref 1.0–2.5)
ALT: 20 U/L (ref 6–29)
AST: 21 U/L (ref 10–35)
Albumin: 4 g/dL (ref 3.6–5.1)
Alkaline phosphatase (APISO): 88 U/L (ref 33–130)
BUN: 17 mg/dL (ref 7–25)
CALCIUM: 9.2 mg/dL (ref 8.6–10.4)
CHLORIDE: 107 mmol/L (ref 98–110)
CO2: 27 mmol/L (ref 20–32)
CREATININE: 0.69 mg/dL (ref 0.60–0.93)
GFR, EST AFRICAN AMERICAN: 102 mL/min/{1.73_m2} (ref >=60)
GFR, Est Non African American: 88 mL/min/{1.73_m2} (ref >=60)
GLOBULIN: 2.1 g/dL (ref 1.9–3.7)
Glucose, Bld: 100 mg/dL — ABNORMAL HIGH (ref 65–99)
POTASSIUM: 4.1 mmol/L (ref 3.5–5.3)
Sodium: 140 mmol/L (ref 135–146)
Total Bilirubin: 0.3 mg/dL (ref 0.2–1.2)
Total Protein: 6.1 g/dL (ref 6.1–8.1)

## 2017-09-29 LAB — CBC WITH DIFFERENTIAL/PLATELET
BASOS ABS: 84 {cells}/uL (ref 0–200)
BASOS PCT: 1 %
EOS PCT: 4.8 %
Eosinophils Absolute: 403 cells/uL (ref 15–500)
HEMATOCRIT: 44.5 % (ref 35.0–45.0)
HEMOGLOBIN: 15.3 g/dL (ref 11.7–15.5)
LYMPHS ABS: 2327 {cells}/uL (ref 850–3900)
MCH: 31.9 pg (ref 27.0–33.0)
MCHC: 34.4 g/dL (ref 32.0–36.0)
MCV: 92.7 fL (ref 80.0–100.0)
MONOS PCT: 10.3 %
MPV: 11.1 fL (ref 7.5–12.5)
NEUTROS ABS: 4721 {cells}/uL (ref 1500–7800)
Neutrophils Relative %: 56.2 %
Platelets: 265 10*3/uL (ref 140–400)
RBC: 4.8 10*6/uL (ref 3.80–5.10)
RDW: 13.5 % (ref 11.0–15.0)
Total Lymphocyte: 27.7 %
WBC mixed population: 865 cells/uL (ref 200–950)
WBC: 8.4 10*3/uL (ref 3.8–10.8)

## 2017-09-29 LAB — RHEUMATOID FACTOR: Rhuematoid fact SerPl-aCnc: 36 IU/mL — ABNORMAL HIGH (ref <14)

## 2017-09-29 LAB — IFE INTERPRETATION

## 2017-09-29 NOTE — Progress Notes (Signed)
Please refer to hematology for evaluation of abnormal SPEP/IFE.

## 2017-09-29 NOTE — Telephone Encounter (Signed)
I spoke with patient regarding her SPEP results.  I will be placed a referral to hematology today.  All questions were addressed.   Patient reports she has been experiencing nausea after eating recently.  She denies any correlation with when she takes MTX sq injections.  She was advised to follow up with PCP.  She also states she has felt "winded" more frequently.  She denies any cough.  She denies a history of COPD.  She was encouraged to notify PCP.

## 2017-09-29 NOTE — Telephone Encounter (Signed)
I attempted to call patient to review recent lab results.  We will be referring her to hematology for evaluation of abnormal SPEP and IFE.  CBC and CMP were WNL.

## 2017-10-01 ENCOUNTER — Telehealth: Payer: Self-pay | Admitting: *Deleted

## 2017-10-01 DIAGNOSIS — R778 Other specified abnormalities of plasma proteins: Secondary | ICD-10-CM

## 2017-10-01 NOTE — Telephone Encounter (Signed)
-----   Message from Ofilia Neas, PA-C sent at 09/29/2017  1:41 PM EDT ----- Please refer to hematology for evaluation of abnormal SPEP/IFE.

## 2017-10-07 ENCOUNTER — Telehealth: Payer: Self-pay | Admitting: Rheumatology

## 2017-10-07 DIAGNOSIS — G4733 Obstructive sleep apnea (adult) (pediatric): Secondary | ICD-10-CM | POA: Diagnosis not present

## 2017-10-07 NOTE — Telephone Encounter (Signed)
Patient advised that we have referred to the [facility]. Patient advised that they will schedule her with a doctor they have available. Patient advised that the test she is being referred for is an abnormal SPEP which is an abnormal protein found in her blood. Patient was grateful for the explanation and will call to schedule the appointment.

## 2017-10-07 NOTE — Telephone Encounter (Signed)
Patient left a voicemail stating she spoke with Lovena Le last week and was out of town and when I got home I received a message about seeing a Hematologist and was given a phone number.  Patient states she has a few questions about what the  concerns with my bloodwork are and find out the name of a Hematologist in case I call this number and they ask me who I want to see.  I am available today after 11:00 today and will keep my phone with me.

## 2017-10-08 ENCOUNTER — Telehealth: Payer: Self-pay | Admitting: Hematology and Oncology

## 2017-10-08 NOTE — Telephone Encounter (Signed)
New referral received from Dr. Estanislado Pandy for abnormal SPEP. Pt has been cld and scheduled to see Dr. Audelia Hives on 10/8 at [place]. Pt aware to arrive 30 minutes early.

## 2017-10-09 ENCOUNTER — Telehealth: Payer: Self-pay

## 2017-10-09 ENCOUNTER — Other Ambulatory Visit: Payer: Self-pay | Admitting: Obstetrics and Gynecology

## 2017-10-09 NOTE — Telephone Encounter (Signed)
Medication refill request: yuvafem 51mcg vaginal tab  Last AEX:  11/07/16 Next AEX: 11/10/17 Last MMG (if hormonal medication request):02/03/17 Bi-rads 1 neg  Refill authorized:  #8 with 0 refills Please refill if until AEX if appropriate

## 2017-10-09 NOTE — Telephone Encounter (Signed)
I spoke with Deanna Norris; Deanna Norris said rheumatologist advised Deanna Norris to see hematologist at [facility]. Deanna Norris has appt to see Dr Audelia Hives on 10/12/17. Deanna Norris did investigation on line and found Dr Audelia Hives had privileges revoked from [facility] in the past. Deanna Norris prefers not too see Dr Audelia Hives but rheumatologist will not specify the name of a hematologist to see and Deanna Norris does not want physician picked randomly. Deanna Norris request Dr Glori Bickers to give name of a hematologist she would recommend Deanna Norris to see. Deanna Norris request cb today.

## 2017-10-09 NOTE — Telephone Encounter (Signed)
Copied from [place] [id number]. Topic: General - Other >> Oct 09, 2017  8:48 AM Carolyn Stare wrote:  Pt call to ask for a recommendation for a hematology at [facility]

## 2017-10-09 NOTE — Telephone Encounter (Signed)
Some of them differ by what disorder they see I like Dr Alvy Bimler, Dr Jana Hakim and Dr Lindi Adie quite a bit

## 2017-10-09 NOTE — Telephone Encounter (Signed)
Pt notified of Dr. Marliss Coots comments. Pt did want me to let Dr. Glori Bickers know that she had the wrong Dr. Audelia Hives, pt said that it was old info and the doc she was researching today didn't have his privileges revoked. Dr. Glori Bickers made aware

## 2017-10-12 ENCOUNTER — Telehealth: Payer: Self-pay | Admitting: Hematology

## 2017-10-12 ENCOUNTER — Inpatient Hospital Stay: Payer: Medicare Other | Admitting: Hematology and Oncology

## 2017-10-12 NOTE — Telephone Encounter (Signed)
Pt cld wanting to schedule an appt with Dr. Irene Limbo for hematology. Pt has been scheduled to see Dr. Irene Limbo on 10/8 at 11am. Pt aware to arrive 30 minutes early.

## 2017-10-13 ENCOUNTER — Inpatient Hospital Stay: Payer: Medicare Other | Attending: Hematology | Admitting: Hematology

## 2017-10-13 ENCOUNTER — Telehealth: Payer: Self-pay

## 2017-10-13 ENCOUNTER — Inpatient Hospital Stay: Payer: Medicare Other

## 2017-10-13 VITALS — BP 125/69 | HR 63 | Temp 98.1°F | Resp 18 | Ht 65.75 in | Wt 177.4 lb

## 2017-10-13 DIAGNOSIS — Z79899 Other long term (current) drug therapy: Secondary | ICD-10-CM | POA: Diagnosis not present

## 2017-10-13 DIAGNOSIS — D472 Monoclonal gammopathy: Secondary | ICD-10-CM

## 2017-10-13 DIAGNOSIS — M069 Rheumatoid arthritis, unspecified: Secondary | ICD-10-CM | POA: Diagnosis not present

## 2017-10-13 DIAGNOSIS — R0609 Other forms of dyspnea: Secondary | ICD-10-CM | POA: Diagnosis not present

## 2017-10-13 DIAGNOSIS — Z806 Family history of leukemia: Secondary | ICD-10-CM | POA: Diagnosis not present

## 2017-10-13 DIAGNOSIS — Z803 Family history of malignant neoplasm of breast: Secondary | ICD-10-CM | POA: Insufficient documentation

## 2017-10-13 LAB — CMP (CANCER CENTER ONLY)
ALT: 27 U/L (ref 0–44)
AST: 28 U/L (ref 15–41)
Albumin: 3.9 g/dL (ref 3.5–5.0)
Alkaline Phosphatase: 84 U/L (ref 38–126)
Anion gap: 8 (ref 5–15)
BILIRUBIN TOTAL: 0.5 mg/dL (ref 0.3–1.2)
BUN: 12 mg/dL (ref 8–23)
CO2: 27 mmol/L (ref 22–32)
CREATININE: 0.75 mg/dL (ref 0.44–1.00)
Calcium: 9.2 mg/dL (ref 8.9–10.3)
Chloride: 107 mmol/L (ref 98–111)
GFR, Est AFR Am: >60 mL/min (ref >60)
Glucose, Bld: 90 mg/dL (ref 70–99)
POTASSIUM: 4.1 mmol/L (ref 3.5–5.1)
Sodium: 142 mmol/L (ref 135–145)
TOTAL PROTEIN: 7 g/dL (ref 6.5–8.1)

## 2017-10-13 LAB — CBC WITH DIFFERENTIAL/PLATELET
Abs Immature Granulocytes: 0.02 10*3/uL (ref 0.00–0.07)
BASOS ABS: 0.1 10*3/uL (ref 0.0–0.1)
Basophils Relative: 1 %
EOS ABS: 0.4 10*3/uL (ref 0.0–0.5)
EOS PCT: 6 %
HEMATOCRIT: 47.3 % — AB (ref 36.0–46.0)
Hemoglobin: 15.8 g/dL — ABNORMAL HIGH (ref 12.0–15.0)
Immature Granulocytes: 0 %
Lymphocytes Relative: 27 %
Lymphs Abs: 1.9 10*3/uL (ref 0.7–4.0)
MCH: 31.7 pg (ref 26.0–34.0)
MCHC: 33.4 g/dL (ref 30.0–36.0)
MCV: 94.8 fL (ref 80.0–100.0)
Monocytes Absolute: 0.8 10*3/uL (ref 0.1–1.0)
Monocytes Relative: 11 %
NRBC: 0 % (ref 0.0–0.2)
Neutro Abs: 3.9 10*3/uL (ref 1.7–7.7)
Neutrophils Relative %: 55 %
Platelets: 235 10*3/uL (ref 150–400)
RBC: 4.99 MIL/uL (ref 3.87–5.11)
RDW: 14.2 % (ref 11.5–15.5)
WBC: 7.1 10*3/uL (ref 4.0–10.5)

## 2017-10-13 LAB — LACTATE DEHYDROGENASE: LDH: 217 U/L — ABNORMAL HIGH (ref 98–192)

## 2017-10-13 NOTE — Progress Notes (Signed)
HEMATOLOGY/ONCOLOGY CONSULTATION NOTE  Date of Service: 10/13/2017  Patient Care Team: Tower, [name], MD as PCP - Kennon Portela, MD as Consulting Physician (Dermatology) Juanita Craver, MD as Consulting Physician (Gastroenterology) Marica Otter, OD as Consulting Physician (Optometry) Belva Crome, MD as Consulting Physician (Cardiology)  CHIEF COMPLAINTS/PURPOSE OF CONSULTATION:  Monoclonal Protein  HISTORY OF PRESENTING ILLNESS:   Deanna Norris is a wonderful 72 y.o. female who has been referred to [place] by Dr. Loura Pardon for evaluation and management of Monoclonal Protein. The pt reports that she is doing well overall.   The pt reports that she was diagnosed with RA 12-13 years ago. She has also had some symptoms of dry mouth and dry eyes and has concern for Sjogren's syndrome. She has pursued injectable methotrexate every week, and also Orencia for the past 3-4 years. She took oral methotrexate upon diagnosis until 7-8 years ago, when she developed vertigo symptoms and switched to injectable. She notes that her joint pains are well controlled at this time. The pt notes that she gets SOB relatively easily and has spoken with her Rheumatologist and cardiologist about this five years ago and denies this changing. She denies concerns for lung involvement with her RA or treatment. The pt notes that she feels some nausea occasionally as well.   Most recent lab results (09/25/17) of CBC w/diff and CMP is as follows: all values are WNL except for Glucose at 100. 09/25/17 SPEP revealed all values WNL, however the IFE interpretation noted A poorly defined area of restricted protein mobility is detected and is reactive with IgG and Kappa antisera.  On review of systems, pt reports stable energy levels, SOB on exertion, dry eyes, dry mouth, occasional nausea, and denies changes in breathing, uncontrolled joint pains, abdominal pains, skin rashes, leg swelling, unexpected weight loss, and  any other symptoms.   On PMHx the pt reports RA, sleep apnea with CPAP use.   MEDICAL HISTORY:  Past Medical History:  Diagnosis Date  . Asthma   . Fibroid   . GERD (gastroesophageal reflux disease)   . Insomnia   . Menopausal symptoms   . Osteoporosis   . Postmenopausal HRT (hormone replacement therapy)   . RA (rheumatoid arthritis) ([place]) 11/2004   methotrexate and Embrel  . Sjogren's disease ([place])   . Sleep apnea 2008   C-Pap  . Vitamin D deficiency     SURGICAL HISTORY: Past Surgical History:  Procedure Laterality Date  . ABDOMINAL HYSTERECTOMY  01/1993   partial with AP repair, secondary to fibroids  . BREAST SURGERY  1991   breast biopsy  . CYSTOCELE REPAIR  10/2007   with graft  . MELANOMA EXCISION Right 01/2012   excision right posterior arm  . NASAL SINUS SURGERY  07/1991  . VAGINAL DELIVERY     x3    SOCIAL HISTORY: Social History   Socioeconomic History  . Marital status: Married    Spouse name: Not on file  . Number of children: Not on file  . Years of education: Not on file  . Highest education level: Not on file  Occupational History  . Occupation: housewife    Employer: Not Employed  Social Needs  . Financial resource strain: Not on file  . Food insecurity:    Worry: Not on file    Inability: Not on file  . Transportation needs:    Medical: Not on file    Non-medical: Not on file  Tobacco Use  .  Smoking status: Never Smoker  . Smokeless tobacco: Never Used  Substance and Sexual Activity  . Alcohol use: No    Alcohol/week: 0.0 standard drinks  . Drug use: No  . Sexual activity: Yes    Partners: Male    Birth control/protection: Surgical    Comment: TAH  Lifestyle  . Physical activity:    Days per week: Not on file    Minutes per session: Not on file  . Stress: Not on file  Relationships  . Social connections:    Talks on phone: Not on file    Gets together: Not on file    Attends religious service: Not on file    Active member  of club or organization: Not on file    Attends meetings of clubs or organizations: Not on file    Relationship status: Not on file  . Intimate partner violence:    Fear of current or ex partner: Not on file    Emotionally abused: Not on file    Physically abused: Not on file    Forced sexual activity: Not on file  Other Topics Concern  . Not on file  Social History Narrative  . Not on file    FAMILY HISTORY: Family History  Problem Relation Age of Onset  . Breast cancer Sister 85  . Thyroid disease Sister   . Cancer Sister 58       Leukemia--CLL  . Heart disease Brother     ALLERGIES:  is allergic to adalimumab; diclofenac; dicyclomine; doxycycline hyclate; enbrel [etanercept]; fosamax [alendronate sodium]; methotrexate derivatives; nickel; piroxicam; salagen [pilocarpine hcl]; sulfamethoxazole-trimethoprim; tramadol hcl; and plaquenil [hydroxychloroquine].  MEDICATIONS:  Current Outpatient Medications  Medication Sig Dispense Refill  . albuterol (PROAIR HFA) 108 (90 BASE) MCG/ACT inhaler Inhale 2 puffs into the lungs every 4 (four) hours as needed. 1 Inhaler 3  . aspirin 81 MG tablet Take 81 mg by mouth daily.     . Calcium Carbonate-Vitamin D (CALCIUM 500/D PO) Take 1 tablet by mouth 2 (two) times daily.    . Cholecalciferol (VITAMIN D3) 2000 UNITS TABS Take 1 tablet by mouth daily.      Marland Kitchen desonide (DESOWEN) 0.05 % lotion Apply topically as needed.    . diclofenac sodium (VOLTAREN) 1 % GEL Apply 1 application topically at bedtime as needed.     Mariane Baumgarten Calcium (STOOL SOFTENER PO) Take 1 capsule by mouth 2 (two) times daily.    . Flaxseed, Linseed, (FLAX SEED OIL) 1000 MG CAPS Take by mouth daily.    . fluticasone (FLONASE) 50 MCG/ACT nasal spray USE 2 SPRAYS IN EACH NOSTRIL DAILY AS NEEDED FOR RHINITIS OR ALLERGIES (Patient taking differently: daily. USE 2 SPRAYS IN EACH NOSTRIL DAILY AS NEEDED FOR RHINITIS OR ALLERGIES) 48 g 3  . folic acid (FOLVITE) 1 MG tablet Take 2  tablets (2 mg total) by mouth daily. 180 tablet 3  . hydrocortisone 2.5 % ointment Apply 1 application topically as needed.     . Ibuprofen 200 MG CAPS Take 3 capsules by mouth as needed.    . Magnesium Malate 1250 (141.7 Mg) MG TABS Take 3 tablets by mouth daily.     . methotrexate 50 MG/2ML injection Inject 0.9 mLs (22.5 mg total) into the skin once a week. 12 mL 0  . Multiple Vitamins-Minerals (SENIOR MULTIVITAMIN PLUS PO) Take 1 capsule by mouth daily.    . NON FORMULARY CBD cream and oil prn    . ORENCIA 125 MG/ML SOSY  INJECT THE CONTENTS OF 1 SYRINGE UNDER THE SKIN EVERY WEEK 4 mL 2  . ranitidine (ZANTAC) 300 MG tablet Take 300 mg by mouth 2 (two) times daily.     . Tuberculin-Allergy Syringes 27G X 1/2" 1 ML KIT Inject 1 Syringe into the skin once a week. To be used weekly with methotrexate.    Merril Abbe 10 MCG TABS vaginal tablet INSERT 1 TABLET VAGINALLY TWICE A WEEK 8 tablet 0  . zoledronic acid (RECLAST) 5 MG/100ML SOLN injection Inject 5 mg into the vein once. Takes yearly    . zolpidem (AMBIEN) 10 MG tablet Take 0.5 tablets (5 mg total) by mouth at bedtime as needed for sleep. 30 tablet 1   No current facility-administered medications for this visit.     REVIEW OF SYSTEMS:    10 Point review of Systems was done is negative except as noted above.  PHYSICAL EXAMINATION:  . Vitals:   10/13/17 1134  BP: 125/69  Pulse: 63  Resp: 18  Temp: 98.1 F (36.7 C)  SpO2: 94%   Filed Weights   10/13/17 1134  Weight: 177 lb 6.4 oz (80.5 kg)   .Body mass index is 28.85 kg/m.  GENERAL:alert, in no acute distress and comfortable SKIN: no acute rashes, no significant lesions EYES: conjunctiva are pink and non-injected, sclera anicteric OROPHARYNX: MMM, no exudates, no oropharyngeal erythema or ulceration NECK: supple, no JVD LYMPH:  no palpable lymphadenopathy in the cervical, axillary or inguinal regions LUNGS: clear to auscultation b/l with normal respiratory effort HEART:  regular rate & rhythm ABDOMEN:  normoactive bowel sounds , non tender, not distended. Extremity: no pedal edema PSYCH: alert & oriented x 3 with fluent speech NEURO: no focal motor/sensory deficits  LABORATORY DATA:  I have reviewed the data as listed  . CBC Latest Ref Rng & Units 09/25/2017 06/08/2017 03/03/2017  WBC 3.8 - 10.8 Thousand/uL 8.4 9.1 7.7  Hemoglobin 11.7 - 15.5 g/dL 15.3 14.8 15.7(H)  Hematocrit 35.0 - 45.0 % 44.5 42.0 47.0(H)  Platelets 140 - 400 Thousand/uL 265 252 261    . CMP Latest Ref Rng & Units 09/25/2017 09/25/2017 06/08/2017  Glucose 65 - 99 mg/dL 100(H) - 101(H)  BUN 7 - 25 mg/dL 17 - 15  Creatinine 0.60 - 0.93 mg/dL 0.69 - 0.77  Sodium 135 - 146 mmol/L 140 - 139  Potassium 3.5 - 5.3 mmol/L 4.1 - 4.6  Chloride 98 - 110 mmol/L 107 - 104  CO2 20 - 32 mmol/L 27 - 29  Calcium 8.6 - 10.4 mg/dL 9.2 - 9.3  Total Protein 6.1 - 8.1 g/dL 6.1 6.4 6.4  Total Bilirubin 0.2 - 1.2 mg/dL 0.3 - 0.5  Alkaline Phos 33 - 130 U/L - - -  AST 10 - 35 U/L 21 - 21  ALT 6 - 29 U/L 20 - 18     RADIOGRAPHIC STUDIES: I have personally reviewed the radiological images as listed and agreed with the findings in the report. No results found.  ASSESSMENT & PLAN:  72 y.o. female with  1. Monoclonal Gammopathy of Undetermined Significance  -Discussed patient's most recent labs from 09/25/17, all blood counts and chemistries were normal. SPEP was normal including a non-measurable M-spike -Discussed that 09/25/17 IFE interpretation which revealed a poorly defined area of restricted protein mobility, reactive with IgG Kappa -Discussed with the pt that in the context of known RA, diagnosed 12-13 years ago, her small abnormal protein is not clinically worrisome for myeloma -No anemia, normal creatinine,  no hypercalcemia  -Will order blood tests today  -Will see the pt back in 6 months to rule out signs of progression, and then will discharge pt back to PCP and Rheumatology    Labs  today RTC with Dr Irene Limbo in 6 months with labs    All of the patients questions were answered with apparent satisfaction. The patient knows to call the clinic with any problems, questions or concerns.  The total time spent in the appt was 40 minutes and more than 50% was on counseling and direct patient cares.    Sullivan Lone MD MS AAHIVMS [facility] Hematology/Oncology Physician [facility]  (Office):       [phone] (Work cell):  [phone] (Fax):           [phone]  10/13/2017 12:05 PM  I, Baldwin [place], am acting as a scribe for Dr. Irene Limbo  .I have reviewed the above documentation for accuracy and completeness, and I agree with the above. Brunetta Genera MD

## 2017-10-13 NOTE — Telephone Encounter (Signed)
Printed avs and calender of upcoming appointment.per 10/8 los 

## 2017-10-14 LAB — MULTIPLE MYELOMA PANEL, SERUM
ALPHA 1: 0.2 g/dL (ref 0.0–0.4)
ALPHA2 GLOB SERPL ELPH-MCNC: 0.6 g/dL (ref 0.4–1.0)
Albumin SerPl Elph-Mcnc: 3.6 g/dL (ref 2.9–4.4)
Albumin/Glob SerPl: 1.4 (ref 0.7–1.7)
B-GLOBULIN SERPL ELPH-MCNC: 1 g/dL (ref 0.7–1.3)
GAMMA GLOB SERPL ELPH-MCNC: 0.9 g/dL (ref 0.4–1.8)
GLOBULIN, TOTAL: 2.6 g/dL (ref 2.2–3.9)
IgA: 281 mg/dL (ref 64–422)
IgG (Immunoglobin G), Serum: 908 mg/dL (ref 700–1600)
IgM (Immunoglobulin M), Srm: 86 mg/dL (ref 26–217)
Total Protein ELP: 6.2 g/dL (ref 6.0–8.5)

## 2017-10-14 LAB — KAPPA/LAMBDA LIGHT CHAINS
KAPPA, LAMDA LIGHT CHAIN RATIO: 1.16 (ref 0.26–1.65)
Kappa free light chain: 20 mg/L — ABNORMAL HIGH (ref 3.3–19.4)
LAMDA FREE LIGHT CHAINS: 17.3 mg/L (ref 5.7–26.3)

## 2017-10-14 LAB — BETA 2 MICROGLOBULIN, SERUM: Beta-2 Microglobulin: 1.7 mg/L (ref 0.6–2.4)

## 2017-11-18 ENCOUNTER — Telehealth: Payer: Self-pay | Admitting: *Deleted

## 2017-11-18 NOTE — Telephone Encounter (Signed)
Patient called, left VM: Had labs done 10/8 with appt. Would like to hear if all ok. Was told that if ok might no need to come to appt 04/13/18. Was advised that MD currently out of office and will be given message to respond on his return. Patient verbalized understanding.

## 2017-11-20 ENCOUNTER — Ambulatory Visit: Payer: Medicare Other | Admitting: Obstetrics and Gynecology

## 2017-11-23 NOTE — Progress Notes (Signed)
72 y.o. [id number] Married Caucasian female here for annual exam.    Patient complaining of vaginal dryness. Uses Vagifem.   Sees. Dr. Bennie Dallas for Reclast for her osteoporosis.  Not due for Reclast until June.   Has chronic constipation and rectocele.  Saw Dr. Amalia Hailey who dx with grade 1 and did not recommend surgery.  Does splinting.   Will have family together for Thanksgiving.   PCP: Loura Pardon, MD  Patient's last menstrual period was 01/06/1993.           Sexually active: Yes.    The current method of family planning is status post hysterectomy.    Exercising: Yes.    walking, yoga Smoker:  no  Health Maintenance: Pap: 10-18-14 Neg History of abnormal Pap:  no  MMG: 02-03-17 3D Neg/density B/BiRads1 Colonoscopy: 01-12-13;next 10 years BMD: 01-16-16  Result : Osteoporosis Rt.Femoral Neck - Tscore -3.4:[facility] TDaP:  03-23-13 Gardasil:   no HIV:no Hep C: 10-31-15 Neg Screening Labs:  Hb today: PCP   reports that she has never smoked. She has never used smokeless tobacco. She reports that she does not drink alcohol or use drugs.  Past Medical History:  Diagnosis Date  . Asthma   . Fibroid   . GERD (gastroesophageal reflux disease)   . Insomnia   . Menopausal symptoms   . Osteoporosis   . Postmenopausal HRT (hormone replacement therapy)   . RA (rheumatoid arthritis) ([place]) 11/2004   methotrexate and Embrel  . Sjogren's disease ([place])   . Sleep apnea 2008   C-Pap  . Vitamin D deficiency     Past Surgical History:  Procedure Laterality Date  . ABDOMINAL HYSTERECTOMY  01/1993   partial with AP repair, secondary to fibroids  . BREAST SURGERY  1991   breast biopsy  . CYSTOCELE REPAIR  10/2007   with graft  . MELANOMA EXCISION Right 01/2012   excision right posterior arm  . NASAL SINUS SURGERY  07/1991  . VAGINAL DELIVERY     x3    Current Outpatient Medications  Medication Sig Dispense Refill  . albuterol (PROAIR HFA) 108 (90 BASE) MCG/ACT inhaler Inhale 2 puffs  into the lungs every 4 (four) hours as needed. 1 Inhaler 3  . aspirin 81 MG tablet Take 81 mg by mouth daily.     . Calcium Carbonate-Vitamin D (CALCIUM 500/D PO) Take 1 tablet by mouth 2 (two) times daily.    . Cholecalciferol (VITAMIN D3) 2000 UNITS TABS Take 1 tablet by mouth daily.      Marland Kitchen desonide (DESOWEN) 0.05 % lotion Apply topically as needed.    . diclofenac sodium (VOLTAREN) 1 % GEL Apply 1 application topically at bedtime as needed.     Mariane Baumgarten Calcium (STOOL SOFTENER PO) Take 1 capsule by mouth 2 (two) times daily.    . fluticasone (FLONASE) 50 MCG/ACT nasal spray USE 2 SPRAYS IN EACH NOSTRIL DAILY AS NEEDED FOR RHINITIS OR ALLERGIES (Patient taking differently: daily. USE 2 SPRAYS IN EACH NOSTRIL DAILY AS NEEDED FOR RHINITIS OR ALLERGIES) 48 g 3  . folic acid (FOLVITE) 1 MG tablet Take 2 tablets (2 mg total) by mouth daily. 180 tablet 3  . hydrocortisone 2.5 % ointment Apply 1 application topically as needed.     . Ibuprofen 200 MG CAPS Take 3 capsules by mouth as needed.    . Magnesium Malate 1250 (141.7 Mg) MG TABS Take 3 tablets by mouth daily.     . methotrexate 50 MG/2ML injection Inject  0.9 mLs (22.5 mg total) into the skin once a week. 12 mL 0  . Multiple Vitamin (MULTI-VITAMINS) TABS Take 1 tablet by mouth daily.    . Multiple Vitamins-Minerals (SENIOR MULTIVITAMIN PLUS PO) Take 1 capsule by mouth daily.    . NON FORMULARY CBD cream and oil prn    . Omega-3 1000 MG CAPS Take 1 tablet by mouth daily.    Marland Kitchen ORENCIA 125 MG/ML SOSY INJECT THE CONTENTS OF 1 SYRINGE UNDER THE SKIN EVERY WEEK 4 mL 2  . ranitidine (ZANTAC) 300 MG tablet Take 300 mg by mouth 2 (two) times daily.     . Tuberculin-Allergy Syringes 27G X 1/2" 1 ML KIT Inject 1 Syringe into the skin once a week. To be used weekly with methotrexate.    Merril Abbe 10 MCG TABS vaginal tablet INSERT 1 TABLET VAGINALLY TWICE A WEEK 8 tablet 0  . zoledronic acid (RECLAST) 5 MG/100ML SOLN injection Inject 5 mg into the vein  once. Takes yearly    . zolpidem (AMBIEN) 10 MG tablet Take 0.5 tablets (5 mg total) by mouth at bedtime as needed for sleep. 30 tablet 1   No current facility-administered medications for this visit.     Family History  Problem Relation Age of Onset  . Breast cancer Sister 58  . Thyroid disease Sister   . Cancer Sister 27       Leukemia--CLL  . Heart disease Brother     Review of Systems  Gastrointestinal: Positive for blood in stool.  Endocrine: Positive for polydipsia.  Musculoskeletal:       Muscle weakness and joint pain--has RA  All other systems reviewed and are negative.   Exam:   BP 124/72 (BP Location: Right Arm, Patient Position: Sitting, Cuff Size: Normal)   Pulse 66   Resp 14   Ht 5' 5.75" (1.67 m)   Wt 179 lb (81.2 kg)   LMP 01/06/1993   BMI 29.11 kg/m     General appearance: alert, cooperative and appears stated age Head: Normocephalic, without obvious abnormality, atraumatic Neck: no adenopathy, supple, symmetrical, trachea midline and thyroid normal to inspection and palpation Lungs: clear to auscultation bilaterally Breasts: normal appearance, no masses or tenderness, No nipple retraction or dimpling, No nipple discharge or bleeding, No axillary or supraclavicular adenopathy Heart: regular rate and rhythm Abdomen: soft, non-tender; no masses, no organomegaly Extremities: extremities normal, atraumatic, no cyanosis or edema Skin: Skin color, texture, turgor normal. No rashes or lesions Lymph nodes: Cervical, supraclavicular, and axillary nodes normal. No abnormal inguinal nodes palpated Neurologic: Grossly normal  Pelvic: External genitalia:  no lesions              Urethra:  normal appearing urethra with no masses, tenderness or lesions              Bartholins and Skenes: normal                 Vagina: normal appearing vagina with normal color and discharge, no lesions.. Second degree cystocele, good apical support with permanent graft present but not  eroding, first degree rectocele.              Cervix:  absent              Pap taken: No. Bimanual Exam:  Uterus:   absent              Adnexa: no mass, fullness, tenderness  Rectal exam: Yes.  .  Confirms.              Anus:  normal sphincter tone, no lesions  Chaperone was present for exam.  Assessment:   Well woman visit with normal exam. Status post TAH/A and P repair - fibroid, prolapse.  Vaginal atrophy.  Cystocele and rectocele.  Borderline severe osteoporosis.  On Reclast.  Hx prior Actonel use.  GERD. Constipation. Chronic.  Rheumatoid arthritis. Joint pain.  Sjogren's.  FH breast cancer - sister. Genetic testing negative.  Plan: Mammogram screening. Recommended self breast awareness. Pap and HR HPV as above. Guidelines for Calcium, Vitamin D, regular exercise program including cardiovascular and weight bearing exercise. We discussed her prolapse.  I recommend observational management and if desires surgery, back to Dr. Amalia Hailey.  Osteoporosis care and BMD through Rheumatology.  Refill of Vagifem.  Discussed potential effect on breast cancer.  Follow up annually and prn.   After visit summary provided.

## 2017-11-25 ENCOUNTER — Encounter: Payer: Self-pay | Admitting: Obstetrics and Gynecology

## 2017-11-25 ENCOUNTER — Ambulatory Visit (INDEPENDENT_AMBULATORY_CARE_PROVIDER_SITE_OTHER): Payer: Medicare Other | Admitting: Obstetrics and Gynecology

## 2017-11-25 ENCOUNTER — Telehealth: Payer: Self-pay | Admitting: Rheumatology

## 2017-11-25 ENCOUNTER — Other Ambulatory Visit: Payer: Self-pay

## 2017-11-25 VITALS — BP 124/72 | HR 66 | Resp 14 | Ht 65.75 in | Wt 179.0 lb

## 2017-11-25 DIAGNOSIS — Z124 Encounter for screening for malignant neoplasm of cervix: Secondary | ICD-10-CM

## 2017-11-25 DIAGNOSIS — M810 Age-related osteoporosis without current pathological fracture: Secondary | ICD-10-CM

## 2017-11-25 DIAGNOSIS — Z01419 Encounter for gynecological examination (general) (routine) without abnormal findings: Secondary | ICD-10-CM | POA: Diagnosis not present

## 2017-11-25 MED ORDER — ESTRADIOL 10 MCG VA TABS: ORAL_TABLET | VAGINAL | 3 refills | Status: DC

## 2017-11-25 NOTE — Patient Instructions (Signed)

## 2017-11-25 NOTE — Telephone Encounter (Signed)
Patient called stating she is due for her Dexa Scan the second week of January and her gynecologist Dr. Quincy Simmonds wants Dr. Estanislado Pandy to order the scan.  Patient is requesting the referral be sent to [facility].

## 2017-11-26 NOTE — Telephone Encounter (Signed)
Left message to advise patient order for bone density scan has been placed.

## 2017-11-26 NOTE — Telephone Encounter (Signed)
yes

## 2017-11-27 ENCOUNTER — Telehealth: Payer: Self-pay | Admitting: *Deleted

## 2017-11-27 NOTE — Telephone Encounter (Signed)
Patient had labs completed 10/13/17. Patient would like to know if lab results are ok. Patient would like to know if she is to keep appointment in April, based on results. Per patient, Dr. Irene Limbo informed her she only needed to return in 6 months if labs needed to be repeated.

## 2017-12-09 NOTE — Telephone Encounter (Signed)
Per Dr. Irene Limbo: Inform patient that there is no sign of multiple myeloma. She does not need to see him again, she can follow up with her primary care provider and rheumatologist. Contacted patient with above information and told patient would cancel upcoming appt. Patient verbalized understanding and thanked caller.

## 2017-12-22 ENCOUNTER — Other Ambulatory Visit: Payer: Self-pay

## 2017-12-22 DIAGNOSIS — Z79899 Other long term (current) drug therapy: Secondary | ICD-10-CM

## 2017-12-23 LAB — CBC WITH DIFFERENTIAL/PLATELET
Absolute Monocytes: 848 cells/uL (ref 200–950)
BASOS PCT: 1.5 %
Basophils Absolute: 120 cells/uL (ref 0–200)
Eosinophils Absolute: 296 cells/uL (ref 15–500)
Eosinophils Relative: 3.7 %
HCT: 44.9 % (ref 35.0–45.0)
Hemoglobin: 15.2 g/dL (ref 11.7–15.5)
Lymphs Abs: 2344 cells/uL (ref 850–3900)
MCH: 32.1 pg (ref 27.0–33.0)
MCHC: 33.9 g/dL (ref 32.0–36.0)
MCV: 94.7 fL (ref 80.0–100.0)
MONOS PCT: 10.6 %
MPV: 10.9 fL (ref 7.5–12.5)
Neutro Abs: 4392 cells/uL (ref 1500–7800)
Neutrophils Relative %: 54.9 %
PLATELETS: 271 10*3/uL (ref 140–400)
RBC: 4.74 10*6/uL (ref 3.80–5.10)
RDW: 13.9 % (ref 11.0–15.0)
TOTAL LYMPHOCYTE: 29.3 %
WBC: 8 10*3/uL (ref 3.8–10.8)

## 2017-12-23 LAB — COMPLETE METABOLIC PANEL WITH GFR
AG RATIO: 1.7 (calc) (ref 1.0–2.5)
ALKALINE PHOSPHATASE (APISO): 73 U/L (ref 33–130)
ALT: 20 U/L (ref 6–29)
AST: 23 U/L (ref 10–35)
Albumin: 4 g/dL (ref 3.6–5.1)
BILIRUBIN TOTAL: 0.4 mg/dL (ref 0.2–1.2)
BUN: 10 mg/dL (ref 7–25)
CHLORIDE: 107 mmol/L (ref 98–110)
CO2: 26 mmol/L (ref 20–32)
Calcium: 9.1 mg/dL (ref 8.6–10.4)
Creat: 0.69 mg/dL (ref 0.60–0.93)
GFR, Est African American: 101 mL/min/{1.73_m2} (ref >=60)
GFR, Est Non African American: 87 mL/min/{1.73_m2} (ref >=60)
Globulin: 2.3 g/dL (calc) (ref 1.9–3.7)
Glucose, Bld: 93 mg/dL (ref 65–99)
POTASSIUM: 4.3 mmol/L (ref 3.5–5.3)
Sodium: 142 mmol/L (ref 135–146)
Total Protein: 6.3 g/dL (ref 6.1–8.1)

## 2018-01-13 ENCOUNTER — Ambulatory Visit: Payer: Medicare Other | Admitting: Physician Assistant

## 2018-01-15 ENCOUNTER — Telehealth: Payer: Self-pay | Admitting: Rheumatology

## 2018-01-15 MED ORDER — METHOTREXATE SODIUM CHEMO INJECTION 50 MG/2ML: 22.5000 mg | INTRAMUSCULAR | 0 refills | Status: DC

## 2018-01-15 MED ORDER — ORENCIA 125 MG/ML ~~LOC~~ SOSY: PREFILLED_SYRINGE | SUBCUTANEOUS | 2 refills | Status: DC

## 2018-01-15 NOTE — Telephone Encounter (Signed)
Last Visit: 08/11/17 Next Visit: 02/02/18 Labs: 12/22/17 WNL TB Gold:  03/03/17 Neg   Okay to refill per Dr. Estanislado Pandy

## 2018-01-15 NOTE — Telephone Encounter (Signed)
Patient needs refill on Orencia for 3 month supply for one month with refill, as previously prescribed to [facility]. Also needs refill MTX sent to [facility]

## 2018-01-19 NOTE — Progress Notes (Signed)
Office Visit Note  Patient: Deanna Norris             Date of Birth: 1945/05/11           MRN: [id number]             PCP: Abner Greenspan, MD Referring: Tower, [name], MD Visit Date: 02/02/2018 Occupation: @GUAROCC @  Subjective:  Bilateral shoulder pain    History of Present Illness: [name] is a 73 y.o. female with history of seropositive rheumatoid arthritis, osteoarthritis, DDD, and osteoporosis.  She is on Orencia 125 mg sq once weekly, MTX 0.9 ml sq once weekly, and folic acid 2 mg po daily. She denies any recent flares.  She denies any recent infections.   She is having increased pain in multiple joints including both shoulders, both knees, and both feet.  She denies any joint swelling.  She has been having discomfort at night in both shoulder joints and both trochanteric bursa. She denies any hand or wrist joint pain at this time.  She experiences bilateral knee pain when going up steps.  She performs yoga for exercise.  She is scheduled for a DEXA tomorrow.  She continues to receive yearly Reclast-most recent infusion June 2019.  She is taking calcium and vitamin D.    Activities of Daily Living:  Patient reports morning stiffness for 5 minutes.   Patient Reports nocturnal pain.  Difficulty dressing/grooming: Denies Difficulty climbing stairs: Reports Difficulty getting out of chair: Reports Difficulty using hands for taps, buttons, cutlery, and/or writing: Denies  Review of Systems  Constitutional: Negative for fatigue.  HENT: Positive for mouth dryness. Negative for mouth sores and nose dryness.   Eyes: Positive for dryness. Negative for pain and visual disturbance.  Respiratory: Negative for cough, hemoptysis, shortness of breath and difficulty breathing.   Cardiovascular: Negative for chest pain, palpitations, hypertension and swelling in legs/feet.  Gastrointestinal: Negative for blood in stool, constipation and diarrhea.  Endocrine: Negative for  increased urination.  Genitourinary: Negative for painful urination.  Musculoskeletal: Positive for arthralgias, joint pain and morning stiffness. Negative for joint swelling, myalgias, muscle weakness, muscle tenderness and myalgias.  Skin: Negative for color change, pallor, rash, hair loss, nodules/bumps, skin tightness, ulcers and sensitivity to sunlight.  Allergic/Immunologic: Negative for susceptible to infections.  Neurological: Negative for dizziness, numbness, headaches and weakness.  Hematological: Negative for swollen glands.  Psychiatric/Behavioral: Negative for depressed mood and sleep disturbance. The patient is not nervous/anxious.     PMFS History:  Patient Active Problem List   Diagnosis Date Noted  . 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 Primary osteoarthritis of both hands 01/11/2016  . 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/11/2016  . High risk medication use 11/15/2015  . Sjogren's syndrome ([place]) 10/31/2015  . Chronic constipation 10/23/2015  . Rosacea 10/23/2015  . Rheumatoid arthritis ([place]) 03/29/2015  . Coronary artery calcification seen on CT scan 11/17/2013  . Recurrent sinusitis 09/16/2013  . Encounter for Medicare annual wellness exam 01/31/2013  . Dyspnea 09/14/2012  . Nodule of right lung 09/14/2012  . Hyperlipidemia 05/08/2009  . 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 ASTHMA 05/08/2009  . GERD 05/08/2009  . MENOPAUSAL DISORDER 05/08/2009  . ARTHRITIS, RHEUMATOID 05/08/2009  . Osteoporosis 05/08/2009  . SPONDYLOLISTHESIS 05/08/2009  . Sleep apnea 05/08/2009    Past Medical History:  Diagnosis Date  . Asthma   . Fibroid   . GERD (gastroesophageal reflux disease)   . Insomnia   .  Menopausal symptoms   . Osteoporosis   . Postmenopausal HRT (hormone replacement therapy)   . RA (rheumatoid arthritis) ([place]) 11/2004   methotrexate and Embrel  . Sjogren's disease ([place])   . Sleep apnea 2008   C-Pap  . Vitamin D  deficiency     Family History  Problem Relation Age of Onset  . Breast cancer Sister 83  . Thyroid disease Sister   . Cancer Sister 60       Leukemia--CLL  . Heart disease Brother    Past Surgical History:  Procedure Laterality Date  . ABDOMINAL HYSTERECTOMY  01/1993   partial with AP repair, secondary to fibroids  . BREAST SURGERY  1991   breast biopsy  . CYSTOCELE REPAIR  10/2007   with graft  . MELANOMA EXCISION Right 01/2012   excision right posterior arm  . NASAL SINUS SURGERY  07/1991  . VAGINAL DELIVERY     x3   Social History   Social History Narrative  . Not on file   Immunization History  Administered Date(s) Administered  . DTaP 04/06/2013  . Influenza Split 10/22/2010  . Influenza Whole 10/10/2009, 10/20/2011  . Influenza, High Dose Seasonal PF 10/14/2016  . Influenza, Seasonal, Injecte, Preservative Fre 10/06/2012  . Influenza,inj,Quad PF,6+ Mos 10/30/2014  . Influenza,inj,quad, With Preservative 10/12/2017  . Influenza-Unspecified 10/26/2013, 10/22/2015  . Pneumococcal Conjugate-13 02/13/2014  . Pneumococcal Polysaccharide-23 06/12/2010, 03/28/2015  . Td 02/07/2003, 03/23/2013  . Tdap 03/23/2013  . Zoster 05/08/2014     Objective: Vital Signs: BP 124/71 (BP Location: Left Arm, Patient Position: Sitting, Cuff Size: Normal)   Pulse 69   Resp 12   Ht 5' 6.75" (1.695 m)   Wt 182 lb 6.4 oz (82.7 kg)   LMP 01/06/1993   BMI 28.78 kg/m    Physical Exam Vitals signs and nursing note reviewed.  Constitutional:      Appearance: She is well-developed.  HENT:     Head: Normocephalic and atraumatic.  Eyes:     Conjunctiva/sclera: Conjunctivae normal.  Neck:     Musculoskeletal: Normal range of motion.  Cardiovascular:     Rate and Rhythm: Normal rate and regular rhythm.     Heart sounds: Normal heart sounds.  Pulmonary:     Effort: Pulmonary effort is normal.     Breath sounds: Normal breath sounds.  Abdominal:     General: Bowel sounds are  normal.     Palpations: Abdomen is soft.  Lymphadenopathy:     Cervical: No cervical adenopathy.  Skin:    General: Skin is warm and dry.     Capillary Refill: Capillary refill takes less than 2 seconds.  Neurological:     Mental Status: She is alert and oriented to person, place, and time.  Psychiatric:        Behavior: Behavior normal.      Musculoskeletal Exam: C-spine, thoracic spine, and lumbar spine good ROM.  No midline spinal tenderness.  No SI joint tenderness.  Shoulder joints, elbow joints, wrist joints, MCPs, PIPs, and DIPs good ROM with no synovitis. Bilateral 2nd and 3rd MCP synovial thickening.  Ulnar deviation noted.  PIP and DIP synovial thickening consistent with osteoarthritis.  Hip joints, knee joints, ankle joints, MTPs, PIPs, and DIPs good ROM with no synovitis.  No warmth or effusion of knee joints. No tenderness or swelling of ankle joints. Synovial thickening of bilateral 1st, 2nd, 3rd, and 5th MTPs. PIP and DIP synovial thickening consistent with osteoarthritis.  Dorsal spurs noted  bilaterally.   CDAI Exam: CDAI Score: 0.4  Patient Global Assessment: 2 (mm); Provider Global Assessment: 2 (mm) Swollen: 0 ; Tender: 0  Joint Exam   Not documented   There is currently no information documented on the homunculus. Go to the Rheumatology activity and complete the homunculus joint exam.  Investigation: No additional findings.  Imaging: Xr Foot 2 Views Left  Result Date: 02/02/2018 First MTP, second third and fourth MTP narrowing was noted.  Juxta-articular osteopenia was noted.  PIP and DIP narrowing was noted.  Metatarsal tarsal joint space narrowing was noted.  No erosive changes were noted.  No intertarsal, subtalar joint space narrowing was noted. Impression: These findings are consistent with rheumatoid arthritis and osteoarthritis overlap.  Xr Foot 2 Views Right  Result Date: 02/02/2018 First MTP, PIP and DIP joint space narrowing was noted.  No erosive  changes were noted.  Juxta-articular osteopenia was noted.  Metatarsal tarsal and intertarsal joint space narrowing was noted.  Dorsal spurring was noted.  No tibiotalar or subtalar joint space narrowing was noted.  Small calcaneal spur was noted.  No interval change was noted when compared to x-ray of 2008. Impression: These findings are consistent with rheumatoid arthritis and osteoarthritis overlap.   Xr Hand 2 View Left  Result Date: 02/02/2018 Second and third MCP severe narrowing was noted.  Juxta-articular osteopenia was noted.  PIP and DIP narrowing was noted.  Severe CMC narrowing was noted.  No significant MCP intercarpal radiocarpal joint space narrowing was noted.  No erosive changes were noted. Impression: These findings are consistent with rheumatoid arthritis and osteoarthritis overlap.  Xr Hand 2 View Right  Result Date: 02/02/2018 Severe second and third MCP narrowing was noted.  Narrowing of all other MCP joints was noted.  PIP DIP and CMC narrowing was noted.  Mild intercarpal and radiocarpal joint space narrowing was noted.  No erosive changes were noted. Impression: These findings are consistent with rheumatoid arthritis and osteoarthritis overlap.  Xr Shoulder Left  Result Date: 02/02/2018 No glenohumeral or acromioclavicular joint space narrowing was noted.  No chondrocalcinosis was noted.  Impression: Unremarkable x-ray of the shoulder joint.  Xr Shoulder Right  Result Date: 02/02/2018 No glenohumeral or acromioclavicular joint space narrowing was noted.  No chondrocalcinosis was noted.  Impression: Unremarkable x-ray of the shoulder joint.   Recent Labs: Lab Results  Component Value Date   WBC 8.0 12/22/2017   HGB 15.2 12/22/2017   PLT 271 12/22/2017   NA 142 12/22/2017   K 4.3 12/22/2017   CL 107 12/22/2017   CO2 26 12/22/2017   GLUCOSE 93 12/22/2017   BUN 10 12/22/2017   CREATININE 0.69 12/22/2017   BILITOT 0.4 12/22/2017   ALKPHOS 84 10/13/2017   AST 23  12/22/2017   ALT 20 12/22/2017   PROT 6.3 12/22/2017   ALBUMIN 3.9 10/13/2017   CALCIUM 9.1 12/22/2017   GFRAA 101 12/22/2017   QFTBGOLDPLUS NEGATIVE 03/03/2017    Speciality Comments: Prior therapy: Plaquenil (allergy), Humira and Enbrel (inadequate response)  Procedures:  No procedures performed Allergies: Adalimumab; Diclofenac; Dicyclomine; Doxycycline hyclate; Enbrel [etanercept]; Fosamax [alendronate sodium]; Methotrexate derivatives; Nickel; Piroxicam; Salagen [pilocarpine hcl]; Sulfamethoxazole-trimethoprim; Tramadol hcl; and Plaquenil [hydroxychloroquine]     Assessment / Plan:     Visit Diagnoses: Rheumatoid arthritis involving multiple sites with positive rheumatoid factor (HCC) -  +RF, +CCP, ANA-,severe erosive: She has no synovitis on exam.  She has not had any recent rheumatoid arthritis flares.  She has synovial thickening of bilateral  second and third MCP joints and ulnar deviation.  She is synovial thickening of the first, second, third, and fifth MTP joints and bilateral feet.  She has been experiencing increased discomfort in bilateral shoulder joints but no warmth or effusion was noted.  X-rays of both hands, feet, and shoulders were obtained today.  Overall she is clinically been doing well on Orencia 125 mg subcutaneous injections every week, methotrexate 0.9 ml sq injections once weekly and folic acid 2 mg by mouth daily.  She has not had any recent infections.  She will continue on his current treatment regimen.  She does not need any refills at this time.  She was advised to notify us if she develops increased joint pain or joint swelling.  She will follow-up in the office in 5 months.- Plan: XR Hand 2 View Left, XR Hand 2 View Right, XR Foot 2 Views Left, XR Foot 2 Views Right, XR Shoulder Left, XR Shoulder Right.  X-rays were consistent with severe rheumatoid arthritis and osteoarthritis overlap.  High risk medication use - Orencia 125 mg q wk, MTX 0.9 sq q wk, folic  acid 2mg  po qd. CBC and CMP are within normal limits on 12/22/2017.  She will return for lab work in March and every 3 months to monitor for drug toxicity.  Last TB gold was 03/03/2017.  TB gold order was placed today.  Standing orders for CBC and CMP are in place.  She received the influenza vaccination in October and previously Prevnar 13 and Pneumovax 23.  She has not had any recent infections.- Plan: QuantiFERON-TB Gold Plus  Sicca syndrome ([place]): ANA negative.  ENA was not ordered previously.  She does not want additional lab work at this time. She continues to have sicca symptoms.  She has no parotid swelling or tenderness on exam.  Chronic pain of both shoulders -she has been having increased discomfort in bilateral shoulders.  She has good range of motion on exam today.  She is been experiencing pain at night.  She declined cortisone injections today.  X-rays of both shoulders were obtained.  We discussed the importance of working on range of motion and strengthening.  She was given a handout of shoulder rehab exercises.  A referral to PT was placed today. Plan: XR Shoulder Left, XR Shoulder Right.  The x-ray of bilateral shoulder joints were unremarkable.  Primary osteoarthritis of both hands: She has PIP and DIP synovial thickening consistent with osteoarthritis of bilateral hands.  She has no synovitis.  She has no discomfort in her hands at this time.  Joint protection and muscle strengthening were discussed.  Primary osteoarthritis of both knees: No warmth or effusion.  She has good range of motion.  She experiences discomfort in both knee joints when climbing steps.  She has no discomfort when walking or standing at this time.  She will call us when she would like to reapply for Euflexxa injections for bilateral knee joints.  Spondylosis of lumbar region without myelopathy or radiculopathy: She has no midline spinal tenderness.  She has no discomfort in her lower back at this time.  She has no  symptoms of radiculopathy at this time.   DDD (degenerative disc disease), lumbar: She has no midline spinal tenderness.    Age-related osteoporosis without current pathological fracture - She is on Reclast IV-June 2019, DEXA 01/16/16: T-3.4 L femur , BMD 0.467.  She is taking Calicum and Vitamin daily.  She is scheduled for a DEXA tomorrow.  If she has not improved while on Reclast we will consider starting her on Forteo or Tymlos.  We briefly discussed both of these medications today in the office.    Abnormal SPEP: She was evaluated by Dr. Irene Limbo.  She will be contacting his office to discuss lab work and to see if she will require repeat labs/follow up visit.   Other medical conditions are listed as follows:   History of gastroesophageal reflux (GERD)  History of hyperlipidemia  History of rosacea  History of sleep apnea    Orders: Orders Placed This Encounter  Procedures  . XR Hand 2 View Left  . XR Hand 2 View Right  . XR Foot 2 Views Left  . XR Foot 2 Views Right  . XR Shoulder Left  . XR Shoulder Right  . QuantiFERON-TB Gold Plus  . Ambulatory referral to Physical Therapy   Meds ordered this encounter  Medications  . zolpidem (AMBIEN) 10 MG tablet    Sig: Take 0.5 tablets (5 mg total) by mouth at bedtime as needed for sleep.    Dispense:  30 tablet    Refill:  0    Face-to-face time spent with patient was 30 minutes. Greater than 50% of time was spent in counseling and coordination of care.  Follow-Up Instructions: Return in about 5 months (around 07/04/2018) for Rheumatoid arthritis, Sjogren's syndrome, Osteoarthritis, DDD, Osteoporosis.   Bo Merino, MD   I examined and evaluated the patient with Hazel Sams PA. The plan of care was discussed as noted above.  Bo Merino, MD  Note - This record has been created using Editor, commissioning.  Chart creation errors have been sought, but may not always  have been located. Such creation errors do not reflect  on  the standard of medical care.

## 2018-02-02 ENCOUNTER — Ambulatory Visit (INDEPENDENT_AMBULATORY_CARE_PROVIDER_SITE_OTHER): Payer: Self-pay

## 2018-02-02 ENCOUNTER — Encounter: Payer: Self-pay | Admitting: Physician Assistant

## 2018-02-02 ENCOUNTER — Ambulatory Visit (INDEPENDENT_AMBULATORY_CARE_PROVIDER_SITE_OTHER): Payer: Medicare Other

## 2018-02-02 ENCOUNTER — Ambulatory Visit (INDEPENDENT_AMBULATORY_CARE_PROVIDER_SITE_OTHER): Payer: Medicare Other | Admitting: Rheumatology

## 2018-02-02 VITALS — BP 124/71 | HR 69 | Resp 12 | Ht 66.75 in | Wt 182.4 lb

## 2018-02-02 DIAGNOSIS — M3500 Sicca syndrome, unspecified: Secondary | ICD-10-CM

## 2018-02-02 DIAGNOSIS — M25512 Pain in left shoulder: Secondary | ICD-10-CM

## 2018-02-02 DIAGNOSIS — Z872 Personal history of diseases of the skin and subcutaneous tissue: Secondary | ICD-10-CM

## 2018-02-02 DIAGNOSIS — Z8669 Personal history of other diseases of the nervous system and sense organs: Secondary | ICD-10-CM

## 2018-02-02 DIAGNOSIS — R778 Other specified abnormalities of plasma proteins: Secondary | ICD-10-CM | POA: Diagnosis not present

## 2018-02-02 DIAGNOSIS — Z8719 Personal history of other diseases of the digestive system: Secondary | ICD-10-CM | POA: Diagnosis not present

## 2018-02-02 DIAGNOSIS — M19042 Primary osteoarthritis, left hand: Secondary | ICD-10-CM

## 2018-02-02 DIAGNOSIS — Z79899 Other long term (current) drug therapy: Secondary | ICD-10-CM

## 2018-02-02 DIAGNOSIS — M0579 Rheumatoid arthritis with rheumatoid factor of multiple sites without organ or systems involvement: Secondary | ICD-10-CM | POA: Diagnosis not present

## 2018-02-02 DIAGNOSIS — M19041 Primary osteoarthritis, right hand: Secondary | ICD-10-CM

## 2018-02-02 DIAGNOSIS — M25511 Pain in right shoulder: Secondary | ICD-10-CM | POA: Diagnosis not present

## 2018-02-02 DIAGNOSIS — M810 Age-related osteoporosis without current pathological fracture: Secondary | ICD-10-CM | POA: Diagnosis not present

## 2018-02-02 DIAGNOSIS — G8929 Other chronic pain: Secondary | ICD-10-CM | POA: Diagnosis not present

## 2018-02-02 DIAGNOSIS — M47816 Spondylosis without myelopathy or radiculopathy, lumbar region: Secondary | ICD-10-CM | POA: Diagnosis not present

## 2018-02-02 DIAGNOSIS — M170 Bilateral primary osteoarthritis of knee: Secondary | ICD-10-CM | POA: Diagnosis not present

## 2018-02-02 DIAGNOSIS — Z8639 Personal history of other endocrine, nutritional and metabolic disease: Secondary | ICD-10-CM

## 2018-02-02 DIAGNOSIS — M5136 Other intervertebral disc degeneration, lumbar region: Secondary | ICD-10-CM

## 2018-02-02 MED ORDER — ZOLPIDEM TARTRATE 10 MG PO TABS: 5.0000 mg | ORAL_TABLET | Freq: Every evening | ORAL | 0 refills | Status: DC | PRN

## 2018-02-02 NOTE — Patient Instructions (Signed)
Standing Labs We placed an order today for your standing lab work.    Please come back and get your standing labs in March and every 3 months  We have open lab Monday through Friday from 8:30-11:30 AM and 1:30-4:00 PM  at the office of Dr. Shaili Deveshwar.   You may experience shorter wait times on Monday and Friday afternoons. The office is located at [address], [address], [place], [place] [address] No appointment is necessary.   Labs are drawn by [facility].  You may receive a bill from [facility] for your lab work.  If you wish to have your labs drawn at another location, please call the office 24 hours in advance to send orders.  If you have any questions regarding directions or hours of operation,  please call [phone].   Just as a reminder please drink plenty of water prior to coming for your lab work. Thanks!  

## 2018-02-04 ENCOUNTER — Encounter: Payer: Self-pay | Admitting: Family Medicine

## 2018-02-04 DIAGNOSIS — K589 Irritable bowel syndrome without diarrhea: Secondary | ICD-10-CM | POA: Diagnosis not present

## 2018-02-04 DIAGNOSIS — Z803 Family history of malignant neoplasm of breast: Secondary | ICD-10-CM | POA: Diagnosis not present

## 2018-02-04 DIAGNOSIS — M810 Age-related osteoporosis without current pathological fracture: Secondary | ICD-10-CM | POA: Diagnosis not present

## 2018-02-04 DIAGNOSIS — Z90710 Acquired absence of both cervix and uterus: Secondary | ICD-10-CM | POA: Diagnosis not present

## 2018-02-04 DIAGNOSIS — Z8262 Family history of osteoporosis: Secondary | ICD-10-CM | POA: Diagnosis not present

## 2018-02-04 DIAGNOSIS — M069 Rheumatoid arthritis, unspecified: Secondary | ICD-10-CM | POA: Diagnosis not present

## 2018-02-04 DIAGNOSIS — Z1231 Encounter for screening mammogram for malignant neoplasm of breast: Secondary | ICD-10-CM | POA: Diagnosis not present

## 2018-02-07 ENCOUNTER — Encounter: Payer: Self-pay | Admitting: Hematology

## 2018-02-09 ENCOUNTER — Telehealth: Payer: Self-pay | Admitting: Obstetrics and Gynecology

## 2018-02-09 NOTE — Telephone Encounter (Signed)
Please contact patient to let her know that her BMD report from [facility] for 02/04/18 is showing osteoporosis of the hips and osteopenia of the spine.   Dr. Estanislado Pandy, her rheumatologist is managing her care for her osteoporosis.  I will have the office forward a copy to her for her review and management.

## 2018-02-10 NOTE — Telephone Encounter (Signed)
Spoke with patient. Advised as seen below per Dr. Quincy Simmonds.   Patient has not been notified of results, she will contact Dr. Estanislado Pandy for recommendations/management. Patient request copy of BMD report be faxed to Dr. Estanislado Pandy and a copy mailed to address on file.   Patient verbalizes understanding and is agreeable.   Results faxed to Dr. Estanislado Pandy at [phone] Copy of BMD results placed in outgoing mail. Results to scan.  Encounter closed.

## 2018-02-10 NOTE — Telephone Encounter (Signed)
Left message to call Ronnae Kaser, RN at [facility] [phone].   

## 2018-02-15 ENCOUNTER — Telehealth: Payer: Self-pay | Admitting: *Deleted

## 2018-02-15 ENCOUNTER — Other Ambulatory Visit: Payer: Self-pay

## 2018-02-15 ENCOUNTER — Ambulatory Visit: Payer: Medicare Other | Attending: Physician Assistant

## 2018-02-15 DIAGNOSIS — M25512 Pain in left shoulder: Secondary | ICD-10-CM | POA: Diagnosis not present

## 2018-02-15 DIAGNOSIS — R293 Abnormal posture: Secondary | ICD-10-CM | POA: Diagnosis not present

## 2018-02-15 DIAGNOSIS — M25511 Pain in right shoulder: Secondary | ICD-10-CM

## 2018-02-15 DIAGNOSIS — M25612 Stiffness of left shoulder, not elsewhere classified: Secondary | ICD-10-CM

## 2018-02-15 DIAGNOSIS — M25611 Stiffness of right shoulder, not elsewhere classified: Secondary | ICD-10-CM

## 2018-02-15 NOTE — Patient Instructions (Signed)
Scapula retraction and depression 1-2-3 reps every hour, reaching overhead with scapula retraction depression  1-2 reps 2-3 x per day stop if pain is sharp  Or severe

## 2018-02-15 NOTE — Telephone Encounter (Signed)
Contacted patient r/t MyChart messages. Per patient, she has not spoken with Dr. Irene Limbo directly regarding her test results. Dr. Irene Limbo had informed her via MyChart that he relayed all information to her PCP and that she should f/u with PCP. Patient MyChart response: "My rheumatologists', physician assistant, Hazel Sams, ordered my visit with you, not my PCP. Please send a copy of your information to her. My PCP will not discuss my results without an appointment because she did not order my visit and ensuing bloodwork."  Patient asked if Dr. Irene Limbo sent his interpretation of the labs to PCP/Dr. Glori Bickers. Patient states Dr. Glori Bickers is with [employer].  Informed her that Dr. Irene Limbo did route his notes from the patient's OV on 10/13/2017 and the lab results and that PCP has all information available to her in EMR/Epic. Patient states she has appt with PCP on 02/17/2018 so will discuss results with her at that time. Patient also asked to have lab results from 01/13/2017 mailed to Dr. Arlean Hopping office. Lab results printed and placed in the mail as requested.  Per Dr. Irene Limbo, patient can f/u with PCP for further tests and/or may RTC with Dr. Irene Limbo. Encouraged to contact Dr. Grier Mitts office for questions or concerns.

## 2018-02-15 NOTE — Therapy (Signed)
[place], [place], [address] Phone: [phone]   Fax:  [phone]  Physical Therapy Evaluation  Patient Details  Name: Deanna Norris MRN: [id number] Date of Birth: [birth year] Referring Provider (PT): Armanda Heritage, MD   Encounter Date: 02/15/2018  PT End of Session - 02/15/18 1113    Visit Number  1    Number of Visits  12    Date for PT Re-Evaluation  03/26/18    Authorization Type  [employer] Deanna Norris No PTA    PT Start Time  0930    PT Stop Time  1013    PT Time Calculation (min)  43 min    Activity Tolerance  Patient tolerated treatment well    Behavior During Therapy  [facility] for tasks assessed/performed       Past Medical History:  Diagnosis Date  . Asthma   . Fibroid   . GERD (gastroesophageal reflux disease)   . Insomnia   . Menopausal symptoms   . Osteoporosis   . Postmenopausal HRT (hormone replacement therapy)   . RA (rheumatoid arthritis) ([place]) 11/2004   methotrexate and Embrel  . Sjogren's disease ([place])   . Sleep apnea 2008   C-Pap  . Vitamin D deficiency     Past Surgical History:  Procedure Laterality Date  . ABDOMINAL HYSTERECTOMY  01/1993   partial with AP repair, secondary to fibroids  . BREAST SURGERY  1991   breast biopsy  . CYSTOCELE REPAIR  10/2007   with graft  . MELANOMA EXCISION Right 01/2012   excision right posterior arm  . NASAL SINUS SURGERY  07/1991  . VAGINAL DELIVERY     x3    There were no vitals filed for this visit.   Subjective Assessment - 02/15/18 0939    Subjective  She reports overall pain due to RA but was referred for shoulder pain. This epidsode has been for 6 months.  LT hurts more than RT.   Does Yoga.       Limitations  Lifting;House hold activities   can't sleep on side , reaching out and over head   Diagnostic tests  xrays: negative    Patient Stated Goals  She wants to be able to lye on side and reach with less pain.     Currently in Pain?  Yes    Pain Location  Shoulder    Pain Orientation  Left;Right    Pain Descriptors / Indicators  Aching;Sharp    Pain Type  Chronic pain    Pain Radiating Towards  upper lateral arms and to scapula    Pain Onset  More than a month ago    Pain Frequency  Intermittent    Aggravating Factors   reaching out , lying on side,     Pain Relieving Factors  decr activity.      Multiple Pain Sites  Yes         [facility] PT Assessment - 02/15/18 0001      Assessment   Medical Diagnosis  Bilateral shoulder pain    Referring Provider (PT)  Armanda Heritage, MD    Onset Date/Surgical Date  --   She  had shoulder problems in past releived with RA treatmen   Hand Dominance  Right    Next MD Visit  5 months    Prior Therapy  No      Precautions   Precautions  --    Precaution Comments  OSTEOPOROSIS  Restrictions   Weight Bearing Restrictions  No      Balance Screen   Has the patient fallen in the past 6 months  No      Prior Function   Level of Independence  Independent      Cognition   Overall Cognitive Status  Within Functional Limits for tasks assessed      Posture/Postural Control   Posture Comments  Lt shoulder elevated compared to Rt.       ROM / Strength   AROM / PROM / Strength  AROM;PROM;Strength      AROM   AROM Assessment Site  Shoulder    Right/Left Shoulder  Right;Left    Right Shoulder Extension  45 Degrees    Right Shoulder Flexion  130 Degrees    Right Shoulder ABduction  135 Degrees    Right Shoulder Internal Rotation  58 Degrees    Right Shoulder External Rotation  90 Degrees    Right Shoulder Horizontal ABduction  20 Degrees    Right Shoulder Horizontal  ADduction  110 Degrees    Left Shoulder Extension  35 Degrees    Left Shoulder Flexion  120 Degrees    Left Shoulder ABduction  118 Degrees    Left Shoulder Internal Rotation  60 Degrees    Left Shoulder External Rotation  90 Degrees    Left Shoulder Horizontal ABduction  25 Degrees     Left Shoulder Horizontal ADduction  115 Degrees      Strength   Overall Strength Comments  WNL bilaterally with pain on flex /abduction /adduction  Lt shoulder                Objective measurements completed on examination: See above findings.              PT Education - 02/15/18 1113    Education Details  POC  HEP , with model joint alignment with scapula motion and possible impingement .     Person(s) Educated  Patient    Methods  Explanation;Demonstration;Tactile cues;Verbal cues;Handout    Comprehension  Verbalized understanding;Returned demonstration       PT Short Term Goals - 02/15/18 1118      PT SHORT TERM GOAL #1   Title  She will be indpendent with the initial HEP    Time  3    Period  Weeks    Status  New      PT SHORT TERM GOAL #2   Title  She will improve over head reach with abduciton and flexion to 135 degrees    Time  3    Period  Weeks    Status  New      PT SHORT TERM GOAL #3   Title  she will improve pain 10-20% or more with reaching    Time  3    Period  Weeks    Status  New        PT Long Term Goals - 02/15/18 1119      PT LONG TERM GOAL #1   Title  She will be independent with all HEP issued    Time  6    Period  Weeks    Status  New      PT LONG TERM GOAL #2   Title  She will report no pain generally with reaching and lifting 5 pounds    Time  6    Period  Weeks    Status  New  PT LONG TERM GOAL #3   Title  She will report able to lye on RT and Lt side for 60 min or more for rest or sleep.     Time  6    Period  Weeks    Status  New             Plan - 02/15/18 1102    Clinical Impression Statement  Deanna Norris presents with complaint of overall body pain bur referred for shoulder pain LT >RT.   She has pain with lifting reaching and lying on sides. She demo some limited motion TL> RT .  Her strength is good but pain ful or RT .  She has elevated LT shoulder compared to RT.  If she improves her  scapula posture her ROM improved.  She should improved with skilled PT.     History and Personal Factors relevant to plan of care:  RA with chroinic body pain  with bilateral foot and knee pain.     Clinical Presentation  Stable    Clinical Presentation due to:  pain with RA    Clinical Decision Making  Low    Rehab Potential  Good    Clinical Impairments Affecting Rehab Potential  chroniity of problem    PT Frequency  2x / week    PT Duration  6 weeks    PT Treatment/Interventions  Taping;Passive range of motion;Manual techniques;Patient/family education;Therapeutic exercise;Moist Heat;Iontophoresis 4mg /ml Dexamethasone;Cryotherapy;Ultrasound    PT Next Visit Plan  Review HEP  , isometrics versus  band exercises HEp , modalites for pain,  manual     PT Home Exercise Plan  scapula retraction , over head reach    Consulted and Agree with Plan of Care  Patient       Patient will benefit from skilled therapeutic intervention in order to improve the following deficits and impairments:  Pain, Postural dysfunction, Impaired UE functional use, Decreased range of motion, Decreased activity tolerance  Visit Diagnosis: Right shoulder pain, unspecified chronicity  Left shoulder pain, unspecified chronicity     Problem List Patient Active Problem List   Diagnosis Date Noted  . 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 Primary osteoarthritis of both hands 01/11/2016  . 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/11/2016  . High risk medication use 11/15/2015  . Sjogren's syndrome ([place]) 10/31/2015  . Chronic constipation 10/23/2015  . Rosacea 10/23/2015  . Rheumatoid arthritis ([place]) 03/29/2015  . Coronary artery calcification seen on CT scan 11/17/2013  . Recurrent sinusitis 09/16/2013  . Encounter for Medicare annual wellness exam 01/31/2013  . Dyspnea 09/14/2012  . Nodule of right lung 09/14/2012  . Hyperlipidemia 05/08/2009  . DRY EYE SYNDROME  05/08/2009  . ASTHMA 05/08/2009  . GERD 05/08/2009  . MENOPAUSAL DISORDER 05/08/2009  . ARTHRITIS, RHEUMATOID 05/08/2009  . Osteoporosis 05/08/2009  . SPONDYLOLISTHESIS 05/08/2009  . Sleep apnea 05/08/2009    Darrel Hoover  PT 02/15/2018, 11:27 AM  [facility] [address] [place], [place], [address] Phone: [phone]   Fax:  [phone]  Name: Deanna Norris MRN: [id number] Date of Birth: 1945/11/07

## 2018-02-17 ENCOUNTER — Ambulatory Visit (INDEPENDENT_AMBULATORY_CARE_PROVIDER_SITE_OTHER): Payer: Medicare Other | Admitting: Family Medicine

## 2018-02-17 ENCOUNTER — Encounter: Payer: Self-pay | Admitting: Family Medicine

## 2018-02-17 VITALS — BP 112/78 | HR 75 | Temp 97.4°F | Ht 66.75 in | Wt 178.2 lb

## 2018-02-17 DIAGNOSIS — E7800 Pure hypercholesterolemia, unspecified: Secondary | ICD-10-CM

## 2018-02-17 DIAGNOSIS — D472 Monoclonal gammopathy: Secondary | ICD-10-CM | POA: Insufficient documentation

## 2018-02-17 DIAGNOSIS — R5382 Chronic fatigue, unspecified: Secondary | ICD-10-CM | POA: Diagnosis not present

## 2018-02-17 DIAGNOSIS — M0579 Rheumatoid arthritis with rheumatoid factor of multiple sites without organ or systems involvement: Secondary | ICD-10-CM | POA: Diagnosis not present

## 2018-02-17 DIAGNOSIS — M810 Age-related osteoporosis without current pathological fracture: Secondary | ICD-10-CM | POA: Diagnosis not present

## 2018-02-17 DIAGNOSIS — D899 Disorder involving the immune mechanism, unspecified: Secondary | ICD-10-CM

## 2018-02-17 DIAGNOSIS — R5383 Other fatigue: Secondary | ICD-10-CM | POA: Insufficient documentation

## 2018-02-17 DIAGNOSIS — D849 Immunodeficiency, unspecified: Secondary | ICD-10-CM | POA: Insufficient documentation

## 2018-02-17 NOTE — Patient Instructions (Signed)
Continue calcium and D and exercise  Avoid falling to prevent broken bones  Talk to your rheumatologist about Forteo vs Reclast   Labs are re assuring   I will order labs for April to avoid repeats

## 2018-02-17 NOTE — Assessment & Plan Note (Signed)
Reviewed notes and labs from Dr Irene Limbo Pt was re assured there was no concern of malignancy including multiple myeloma  Rev labs with her to the best of my ability as primary care

## 2018-02-17 NOTE — Progress Notes (Signed)
Subjective:    Patient ID: Deanna Norris, female    DOB: June 09, 1945, 73 y.o.   MRN: [id number]  HPI Here to review labs from 10/19 from Dr Irene Limbo  She was evaluated for monoclonal protein   From his encounter noted the following: Most recent lab results (09/25/17) of CBC w/diff and CMP is as follows: all values are WNL except for Glucose at 100. 09/25/17 SPEP revealed all values WNL, however the IFE interpretation noted A poorly defined area of restricted protein mobility is detected and is reactive with IgG and Kappa antisera.   Told she did not have multiple myeloma   Had nl SPEP  A/P from that oncology visit as follows: ASSESSMENT & PLAN:  73 y.o. female with  1. Monoclonal Gammopathy of Undetermined Significance  -Discussed patient's most recent labs from 09/25/17, all blood counts and chemistries were normal. SPEP was normal including a non-measurable M-spike -Discussed that 09/25/17 IFE interpretation which revealed a poorly defined area of restricted protein mobility, reactive with IgG Kappa -Discussed with the pt that in the context of known RA, diagnosed 12-13 years ago, her small abnormal protein is not clinically worrisome for myeloma -No anemia, normal creatinine, no hypercalcemia  -Will order blood tests today  -Will see the pt back in 6 months to rule out signs of progression, and then will discharge pt back to PCP and Rheumatology   Sees Dr Garen Grams for RA, OA and DDD as well as OP   Labs from October include Nl cmet LDH slt up at 217 Nl alk phos  Beta -2 microglubulin nl  Protein ELP numbers -mostly wnl  Kappa free light chains 20.0 (just over ref range) Lambda free light chains 17.3 (normal)  Kappa lamda light chains 1.16 (normal)  Lab Results  Component Value Date   CALCIUM 9.1 12/22/2017   total protein nl at 7.0  She has had one relative with Multiple Myeloma and one with leukemia   She has PE here in April   Stays fatigued- poss due to  autoimmune dz and chronic pain  Tries to stay active  Planning labs- does not want to repeat cbc and chemistries   Will plan to check TSH and vit D Also due for lipids Lab Results  Component Value Date   CHOL 220 (H) 04/01/2017   HDL 67.30 04/01/2017   LDLCALC 131 (H) 04/01/2017   LDLDIRECT 144.2 02/02/2013   TRIG 105.0 04/01/2017   CHOLHDL 3 04/01/2017     OP - is on reclast for 2 doses Her rheumatologist is interested in forteo  Takes her Ca and D  Walking and doing yoga   Patient Active Problem List   Diagnosis Date Noted  . Fatigue 02/17/2018  . 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 Primary osteoarthritis of both hands 01/11/2016  . 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/11/2016  . High risk medication use 11/15/2015  . Sjogren's syndrome ([place]) 10/31/2015  . Chronic constipation 10/23/2015  . Rosacea 10/23/2015  . Rheumatoid arthritis ([place]) 03/29/2015  . Coronary artery calcification seen on CT scan 11/17/2013  . Recurrent sinusitis 09/16/2013  . Encounter for Medicare annual wellness exam 01/31/2013  . Dyspnea 09/14/2012  . Nodule of right lung 09/14/2012  . Hyperlipidemia 05/08/2009  . 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 ASTHMA 05/08/2009  . GERD 05/08/2009  . MENOPAUSAL DISORDER 05/08/2009  . ARTHRITIS, RHEUMATOID 05/08/2009  . Osteoporosis 05/08/2009  . SPONDYLOLISTHESIS 05/08/2009  . Sleep apnea 05/08/2009  Past Medical History:  Diagnosis Date  . Asthma   . Fibroid   . GERD (gastroesophageal reflux disease)   . Insomnia   . Menopausal symptoms   . Osteoporosis   . Postmenopausal HRT (hormone replacement therapy)   . RA (rheumatoid arthritis) ([place]) 11/2004   methotrexate and Embrel  . Sjogren's disease ([place])   . Sleep apnea 2008   C-Pap  . Vitamin D deficiency    Past Surgical History:  Procedure Laterality Date  . ABDOMINAL HYSTERECTOMY  01/1993   partial with AP repair, secondary to fibroids  . BREAST  SURGERY  1991   breast biopsy  . CYSTOCELE REPAIR  10/2007   with graft  . MELANOMA EXCISION Right 01/2012   excision right posterior arm  . NASAL SINUS SURGERY  07/1991  . VAGINAL DELIVERY     x3   Social History   Tobacco Use  . Smoking status: Never Smoker  . Smokeless tobacco: Never Used  Substance Use Topics  . Alcohol use: No    Alcohol/week: 0.0 standard drinks  . Drug use: No   Family History  Problem Relation Age of Onset  . Breast cancer Sister 73  . Thyroid disease Sister   . Cancer Sister 46       Leukemia--CLL  . Heart disease Brother    Allergies  Allergen Reactions  . Adalimumab     REACTION: frequent sinus infection  . Diclofenac     unk  . Dicyclomine     Turn teeth gray  . Doxycycline Hyclate Other (See Comments)    "turns teeth gray"  . Enbrel [Etanercept]     SINUS INFECTIONS  . Fosamax [Alendronate Sodium]     Acid reflux  . Methotrexate Derivatives Nausea Only    Oral metotrexate  . Nickel   . Piroxicam     unk  . Salagen [Pilocarpine Hcl] Other (See Comments)    Excessive sweating, headaches  . Sulfamethoxazole-Trimethoprim     REACTION: face and hands rash and felt burning sensation  . Tramadol Hcl     REACTION: nausea  . Plaquenil [Hydroxychloroquine] Rash   Current Outpatient Medications on File Prior to Visit  Medication Sig Dispense Refill  . albuterol (PROAIR HFA) 108 (90 BASE) MCG/ACT inhaler Inhale 2 puffs into the lungs every 4 (four) hours as needed. 1 Inhaler 3  . aspirin 81 MG tablet Take 81 mg by mouth daily.     . Calcium Carbonate Antacid (TUMS PO) Take by mouth as needed.    . Calcium Carbonate-Vitamin D (CALCIUM 500/D PO) Take 1 tablet by mouth 2 (two) times daily.    . Cholecalciferol (VITAMIN D3) 2000 UNITS TABS Take 1 tablet by mouth daily.      Marland Kitchen desonide (DESOWEN) 0.05 % lotion Apply topically as needed.    . diclofenac sodium (VOLTAREN) 1 % GEL Apply 1 application topically at bedtime as needed.     Mariane Baumgarten  Calcium (STOOL SOFTENER PO) Take 1 capsule by mouth 2 (two) times daily.    . Estradiol (YUVAFEM) 10 MCG TABS vaginal tablet INSERT 1 TABLET VAGINALLY TWICE A WEEK 24 tablet 3  . fluticasone (FLONASE) 50 MCG/ACT nasal spray USE 2 SPRAYS IN EACH NOSTRIL DAILY AS NEEDED FOR RHINITIS OR ALLERGIES (Patient taking differently: daily. USE 2 SPRAYS IN EACH NOSTRIL DAILY AS NEEDED FOR RHINITIS OR ALLERGIES) 48 g 3  . folic acid (FOLVITE) 1 MG tablet Take 2 tablets (2 mg total) by mouth daily. [place]  tablet 3  . hydrocortisone 2.5 % ointment Apply 1 application topically as needed.     . Ibuprofen 200 MG CAPS Take 3 capsules by mouth as needed.    . linaclotide (LINZESS) 72 MCG capsule Take 72 mcg by mouth daily as needed.    . Magnesium Malate 1250 (141.7 Mg) MG TABS Take 3 tablets by mouth daily.     . methotrexate 50 MG/2ML injection Inject 0.9 mLs (22.5 mg total) into the skin once a week. (Patient taking differently: Inject 20 mg into the skin once a week. ) 12 mL 0  . Multiple Vitamin (MULTI-VITAMINS) TABS Take 1 tablet by mouth daily.    . Omega-3 1000 MG CAPS Take 1 tablet by mouth daily.    Marland Kitchen ORENCIA 125 MG/ML SOSY INJECT THE CONTENTS OF 1 SYRINGE UNDER THE SKIN EVERY WEEK 4 mL 2  . Tuberculin-Allergy Syringes 27G X 1/2" 1 ML KIT Inject 1 Syringe into the skin once a week. To be used weekly with methotrexate.    . TURMERIC CURCUMIN PO Take by mouth daily.    . zoledronic acid (RECLAST) 5 MG/100ML SOLN injection Inject 5 mg into the vein once. Takes yearly    . zolpidem (AMBIEN) 10 MG tablet Take 0.5 tablets (5 mg total) by mouth at bedtime as needed for sleep. 30 tablet 0   No current facility-administered medications on file prior to visit.     Review of Systems  Constitutional: Positive for fatigue. Negative for activity change, appetite change, fever and unexpected weight change.  HENT: Negative for congestion, ear pain, rhinorrhea, sinus pressure and sore throat.   Eyes: Negative for pain,  redness and visual disturbance.  Respiratory: Negative for cough, shortness of breath and wheezing.   Cardiovascular: Negative for chest pain and palpitations.  Gastrointestinal: Negative for abdominal pain, blood in stool, constipation and diarrhea.  Endocrine: Negative for polydipsia and polyuria.  Genitourinary: Negative for dysuria, frequency and urgency.  Musculoskeletal: Positive for arthralgias, back pain, joint swelling and neck pain. Negative for myalgias.  Skin: Negative for pallor and rash.  Allergic/Immunologic: Negative for environmental allergies.  Neurological: Negative for dizziness, tremors, syncope, facial asymmetry, weakness, light-headedness, numbness and headaches.  Hematological: Negative for adenopathy. Does not bruise/bleed easily.  Psychiatric/Behavioral: Negative for decreased concentration and dysphoric mood. The patient is not nervous/anxious.        Objective:   Physical Exam Constitutional:      General: She is not in acute distress.    Appearance: Normal appearance. She is normal weight. She is not ill-appearing.  HENT:     Head: Normocephalic and atraumatic.  Eyes:     General: No scleral icterus.    Extraocular Movements: Extraocular movements intact.     Conjunctiva/sclera: Conjunctivae normal.     Pupils: Pupils are equal, round, and reactive to light.  Neck:     Musculoskeletal: Normal range of motion. No neck rigidity.  Cardiovascular:     Rate and Rhythm: Normal rate and regular rhythm.  Musculoskeletal:        General: No deformity.     Right lower leg: No edema.     Left lower leg: No edema.     Comments: Posture is good No kyphosis   Skin:    General: Skin is warm and dry.     Coloration: Skin is not pale.     Findings: No rash.  Neurological:     Mental Status: She is alert.     Coordination: Coordination  normal.  Psychiatric:        Mood and Affect: Mood normal.           Assessment & Plan:   Problem List Items  Addressed This Visit      Musculoskeletal and Integument   Osteoporosis    Rev last dexa and risk factors for fx  Has had reclast for 2 years  Plans to discuss forteo with her rheumatologist Will check vit D level with next labs  Continue exercise and supplements       Relevant Orders   VITAMIN D 25 Hydroxy (Vit-D Deficiency, Fractures)   Rheumatoid arthritis ([place])    Continues biologic tx with rheumatology (methotrexate and orencia)          Other   Hyperlipidemia    Future lab order done for April  Disc imp of low sat /trans fat diet       Relevant Orders   Lipid panel   Fatigue    Suspect related to several factors including RA Cbc and chemistries are nl  Added tSH and D level to labs planned for April       Relevant Orders   TSH   Immunocompromised ([place])    From biologic agents for RA Aware not to take live vaccines  Also use caution with sick exposures       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 - Primary    Reviewed notes and labs from Dr Irene Limbo Pt was re assured there was no concern of malignancy including multiple myeloma  Rev labs with her to the best of my ability as primary care

## 2018-02-17 NOTE — Assessment & Plan Note (Signed)
Suspect related to several factors including RA Cbc and chemistries are nl  Added tSH and D level to labs planned for April

## 2018-02-17 NOTE — Assessment & Plan Note (Signed)
From biologic agents for RA Aware not to take live vaccines  Also use caution with sick exposures

## 2018-02-17 NOTE — Assessment & Plan Note (Signed)
Continues biologic tx with rheumatology (methotrexate and orencia)

## 2018-02-17 NOTE — Assessment & Plan Note (Signed)
Future lab order done for April  Disc imp of low sat /trans fat diet

## 2018-02-17 NOTE — Assessment & Plan Note (Signed)
Rev last dexa and risk factors for fx  Has had reclast for 2 years  Plans to discuss forteo with her rheumatologist Will check vit D level with next labs  Continue exercise and supplements

## 2018-02-19 ENCOUNTER — Encounter: Payer: Medicare Other | Admitting: Physical Therapy

## 2018-02-23 ENCOUNTER — Ambulatory Visit: Payer: Medicare Other

## 2018-02-23 DIAGNOSIS — M25612 Stiffness of left shoulder, not elsewhere classified: Secondary | ICD-10-CM

## 2018-02-23 DIAGNOSIS — R293 Abnormal posture: Secondary | ICD-10-CM

## 2018-02-23 DIAGNOSIS — M25512 Pain in left shoulder: Secondary | ICD-10-CM | POA: Diagnosis not present

## 2018-02-23 DIAGNOSIS — M25611 Stiffness of right shoulder, not elsewhere classified: Secondary | ICD-10-CM

## 2018-02-23 DIAGNOSIS — M25511 Pain in right shoulder: Secondary | ICD-10-CM

## 2018-02-23 NOTE — Therapy (Signed)
[place], [place], [address] Phone: [phone]   Fax:  [phone]  Physical Therapy Treatment  Patient Details  Name: Deanna Norris MRN: [id number] Date of Birth: 1945-01-15 Referring Provider (PT): Armanda Heritage, MD   Encounter Date: 02/23/2018  PT End of Session - 02/23/18 1334    Visit Number  2    Number of Visits  12    Date for PT Re-Evaluation  03/26/18    Authorization Type  [name] No PTA    PT Start Time  0133    PT Stop Time  0215    PT Time Calculation (min)  42 min    Activity Tolerance  Patient tolerated treatment well    Behavior During Therapy  [facility] for tasks assessed/performed       Past Medical History:  Diagnosis Date  . Asthma   . Fibroid   . GERD (gastroesophageal reflux disease)   . Insomnia   . Menopausal symptoms   . Osteoporosis   . Postmenopausal HRT (hormone replacement therapy)   . RA (rheumatoid arthritis) ([place]) 11/2004   methotrexate and Embrel  . Sjogren's disease ([place])   . Sleep apnea 2008   C-Pap  . Vitamin D deficiency     Past Surgical History:  Procedure Laterality Date  . ABDOMINAL HYSTERECTOMY  01/1993   partial with AP repair, secondary to fibroids  . BREAST SURGERY  1991   breast biopsy  . CYSTOCELE REPAIR  10/2007   with graft  . MELANOMA EXCISION Right 01/2012   excision right posterior arm  . NASAL SINUS SURGERY  07/1991  . VAGINAL DELIVERY     x3    There were no vitals filed for this visit.  Subjective Assessment - 02/23/18 1336    Subjective  No pain today better overall.     Currently in Pain?  No/denies                       [facility] Adult PT Treatment/Exercise - 02/23/18 0001      Exercises   Exercises  Shoulder      Shoulder Exercises: Supine   Horizontal ABduction  Strengthening;Right;Left;12 reps    Theraband Level (Shoulder Horizontal ABduction)  Level 1 (Yellow)    External Rotation   Strengthening;Right;Left;12 reps    Theraband Level (Shoulder External Rotation)  Level 1 (Yellow)      Shoulder Exercises: Standing   External Rotation  Right;Left;15 reps    Theraband Level (Shoulder External Rotation)  Level 1 (Yellow)    Extension  Right;Left;12 reps    Theraband Level (Shoulder Extension)  Level 1 (Yellow)      Shoulder Exercises: Pulleys   Flexion  1 minute      Shoulder Exercises: ROM/Strengthening   UBE (Upper Arm Bike)  L1 5 min               PT Short Term Goals - 02/15/18 1118      PT SHORT TERM GOAL #1   Title  She will be indpendent with the initial HEP    Time  3    Period  Weeks    Status  New      PT SHORT TERM GOAL #2   Title  She will improve over head reach with abduciton and flexion to 135 degrees    Time  3    Period  Weeks    Status  New  PT SHORT TERM GOAL #3   Title  she will improve pain 10-20% or more with reaching    Time  3    Period  Weeks    Status  New        PT Long Term Goals - 02/15/18 1119      PT LONG TERM GOAL #1   Title  She will be independent with all HEP issued    Time  6    Period  Weeks    Status  New      PT LONG TERM GOAL #2   Title  She will report no pain generally with reaching and lifting 5 pounds    Time  6    Period  Weeks    Status  New      PT LONG TERM GOAL #3   Title  She will report able to lye on RT and Lt side for 60 min or more for rest or sleep.     Time  6    Period  Weeks    Status  New            Plan - 02/23/18 1408    Clinical Impression Statement  She reported no pain post but could feel she worked out. Suggested ice or heat if needed and to not do exer tomorrow if pain incr.      Progress reps or band next if sshoulder with 0-1 pain   REview tech to not strain joint    PT Treatment/Interventions  Taping;Passive range of motion;Manual techniques;Patient/family education;Therapeutic exercise;Moist Heat;Iontophoresis 4mg /ml  Dexamethasone;Cryotherapy;Ultrasound    PT Next Visit Plan  Review HEP  ,   band exercises HEP , modalites for pain,  manual     PT Home Exercise Plan  scapula retraction , over head reach, yellow band ER / Ext/ flex  and Hor abduction    Consulted and Agree with Plan of Care  Patient       Patient will benefit from skilled therapeutic intervention in order to improve the following deficits and impairments:  Pain, Postural dysfunction, Impaired UE functional use, Decreased range of motion, Decreased activity tolerance  Visit Diagnosis: Right shoulder pain, unspecified chronicity  Left shoulder pain, unspecified chronicity  Stiffness of right shoulder, not elsewhere classified  Stiffness of left shoulder, not elsewhere classified  Abnormal posture     Problem List Patient Active Problem List   Diagnosis Date Noted  . Fatigue 02/17/2018  . Immunocompromised ([place]) 02/17/2018  . 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 Primary osteoarthritis of both hands 01/11/2016  . 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/11/2016  . High risk medication use 11/15/2015  . Sjogren's syndrome ([place]) 10/31/2015  . Chronic constipation 10/23/2015  . Rosacea 10/23/2015  . Rheumatoid arthritis ([place]) 03/29/2015  . Coronary artery calcification seen on CT scan 11/17/2013  . Recurrent sinusitis 09/16/2013  . Encounter for Medicare annual wellness exam 01/31/2013  . Dyspnea 09/14/2012  . Nodule of right lung 09/14/2012  . Hyperlipidemia 05/08/2009  . 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 ASTHMA 05/08/2009  . GERD 05/08/2009  . MENOPAUSAL DISORDER 05/08/2009  . ARTHRITIS, RHEUMATOID 05/08/2009  . Osteoporosis 05/08/2009  . SPONDYLOLISTHESIS 05/08/2009  . Sleep apnea 05/08/2009    Deanna Norris  PT 02/23/2018, 2:10 PM  [facility] [address] [place], [place], [address] Phone: [phone]  Fax:  [phone]  Name: Deanna Norris MRN: [id number] Date of Birth: [birth year]

## 2018-02-25 ENCOUNTER — Ambulatory Visit: Payer: Medicare Other

## 2018-02-25 DIAGNOSIS — M25612 Stiffness of left shoulder, not elsewhere classified: Secondary | ICD-10-CM

## 2018-02-25 DIAGNOSIS — M25511 Pain in right shoulder: Secondary | ICD-10-CM

## 2018-02-25 DIAGNOSIS — R293 Abnormal posture: Secondary | ICD-10-CM | POA: Diagnosis not present

## 2018-02-25 DIAGNOSIS — M25611 Stiffness of right shoulder, not elsewhere classified: Secondary | ICD-10-CM

## 2018-02-25 DIAGNOSIS — M25512 Pain in left shoulder: Secondary | ICD-10-CM

## 2018-02-25 NOTE — Therapy (Signed)
[place], [place], [address] Phone: [phone]   Fax:  [phone]  Physical Therapy Treatment  Patient Details  Name: Deanna Norris MRN: [id number] Date of Birth: 01/23/1945 Referring Provider (PT): Armanda Heritage, MD   Encounter Date: 02/25/2018  PT End of Session - 02/25/18 0935    Visit Number  3    Number of Visits  12    Date for PT Re-Evaluation  03/26/18    Authorization Type  [name] No PTA    PT Start Time  0930    PT Stop Time  1010    PT Time Calculation (min)  40 min    Activity Tolerance  Patient tolerated treatment well    Behavior During Therapy  [facility] for tasks assessed/performed       Past Medical History:  Diagnosis Date  . Asthma   . Fibroid   . GERD (gastroesophageal reflux disease)   . Insomnia   . Menopausal symptoms   . Osteoporosis   . Postmenopausal HRT (hormone replacement therapy)   . RA (rheumatoid arthritis) ([place]) 11/2004   methotrexate and Embrel  . Sjogren's disease ([place])   . Sleep apnea 2008   C-Pap  . Vitamin D deficiency     Past Surgical History:  Procedure Laterality Date  . ABDOMINAL HYSTERECTOMY  01/1993   partial with AP repair, secondary to fibroids  . BREAST SURGERY  1991   breast biopsy  . CYSTOCELE REPAIR  10/2007   with graft  . MELANOMA EXCISION Right 01/2012   excision right posterior arm  . NASAL SINUS SURGERY  07/1991  . VAGINAL DELIVERY     x3    There were no vitals filed for this visit.  Subjective Assessment - 02/25/18 0934    Subjective  No pain today . No incr pain post last session    Currently in Pain?  No/denies                       [facility] Adult PT Treatment/Exercise - 02/25/18 0001      Shoulder Exercises: Supine   Horizontal ABduction  Strengthening;Right;Left;12 reps    Theraband Level (Shoulder Horizontal ABduction)  Level 2 (Red)    External Rotation  Strengthening;Right;Left;12  reps    Theraband Level (Shoulder External Rotation)  Level 2 (Red)    Flexion  Strengthening;Both;12 reps    Theraband Level (Shoulder Flexion)  Level 2 (Red)      Shoulder Exercises: ROM/Strengthening   Rebounder  L1 3 min for 3 back       Also did 3-5 reps with green band but she was advised to use red at least 1-2 more times , to decr reps if needed , no pain. Also reviewed arm opening stretch and how  not not over stretch the anterior shoulder.          PT Short Term Goals - 02/25/18 1009      PT SHORT TERM GOAL #1   Title  She will be indpendent with the initial HEP    Status  Achieved      PT SHORT TERM GOAL #3   Title  she will improve pain 10-20% or more with reaching    Status  Achieved        PT Long Term Goals - 02/15/18 1119      PT LONG TERM GOAL #1   Title  She will be independent with  all HEP issued    Time  6    Period  Weeks    Status  New      PT LONG TERM GOAL #2   Title  She will report no pain generally with reaching and lifting 5 pounds    Time  6    Period  Weeks    Status  New      PT LONG TERM GOAL #3   Title  She will report able to lye on RT and Lt side for 60 min or more for rest or sleep.     Time  6    Period  Weeks    Status  New            Plan - 02/25/18 6767    PT Treatment/Interventions  Taping;Passive range of motion;Manual techniques;Patient/family education;Therapeutic exercise;Moist Heat;Iontophoresis 4mg /ml Dexamethasone;Cryotherapy;Ultrasound    PT Next Visit Plan  Review HEP  ,   band exercises HEP , modalites for pain,  manual     PT Home Exercise Plan  scapula retraction , over head reach, yellow band ER / Ext/ flex  and Hor abduction    Consulted and Agree with Plan of Care  Patient       Patient will benefit from skilled therapeutic intervention in order to improve the following deficits and impairments:  Pain, Postural dysfunction, Impaired UE functional use, Decreased range of motion, Decreased activity  tolerance  Visit Diagnosis: Right shoulder pain, unspecified chronicity  Left shoulder pain, unspecified chronicity  Stiffness of right shoulder, not elsewhere classified  Stiffness of left shoulder, not elsewhere classified  Abnormal posture     Problem List Patient Active Problem List   Diagnosis Date Noted  . Fatigue 02/17/2018  . Immunocompromised ([place]) 02/17/2018  . 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 Primary osteoarthritis of both hands 01/11/2016  . 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/11/2016  . High risk medication use 11/15/2015  . Sjogren's syndrome ([place]) 10/31/2015  . Chronic constipation 10/23/2015  . Rosacea 10/23/2015  . Rheumatoid arthritis ([place]) 03/29/2015  . Coronary artery calcification seen on CT scan 11/17/2013  . Recurrent sinusitis 09/16/2013  . Encounter for Medicare annual wellness exam 01/31/2013  . Dyspnea 09/14/2012  . Nodule of right lung 09/14/2012  . Hyperlipidemia 05/08/2009  . 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 ASTHMA 05/08/2009  . GERD 05/08/2009  . MENOPAUSAL DISORDER 05/08/2009  . ARTHRITIS, RHEUMATOID 05/08/2009  . Osteoporosis 05/08/2009  . SPONDYLOLISTHESIS 05/08/2009  . Sleep apnea 05/08/2009    Darrel Hoover   PT 02/25/2018, 10:10 AM  [facility] [address] [place], [place], [address] Phone: [phone]   Fax:  [phone]  Name: Deanna Norris MRN: [id number] Date of Birth: Jul 03, 1945

## 2018-03-01 ENCOUNTER — Ambulatory Visit: Payer: Medicare Other

## 2018-03-01 DIAGNOSIS — M25511 Pain in right shoulder: Secondary | ICD-10-CM

## 2018-03-01 DIAGNOSIS — R293 Abnormal posture: Secondary | ICD-10-CM | POA: Diagnosis not present

## 2018-03-01 DIAGNOSIS — M25611 Stiffness of right shoulder, not elsewhere classified: Secondary | ICD-10-CM

## 2018-03-01 DIAGNOSIS — M25512 Pain in left shoulder: Secondary | ICD-10-CM | POA: Diagnosis not present

## 2018-03-01 DIAGNOSIS — M25612 Stiffness of left shoulder, not elsewhere classified: Secondary | ICD-10-CM

## 2018-03-01 NOTE — Therapy (Signed)
[place], [place], [address] Phone: [phone]   Fax:  [phone]  Physical Therapy Treatment  Patient Details  Name: Deanna Norris MRN: [id number] Date of Birth: [birth year] Referring Provider (PT): Armanda Heritage, MD   Encounter Date: 03/01/2018  PT End of Session - 03/01/18 0841    Visit Number  4    Number of Visits  12    Date for PT Re-Evaluation  03/26/18    Authorization Type  [employer] Deanna Norris No PTA    PT Start Time  0845    PT Stop Time  0925    PT Time Calculation (min)  40 min    Activity Tolerance  Patient tolerated treatment well    Behavior During Therapy  [facility] for tasks assessed/performed       Past Medical History:  Diagnosis Date  . Asthma   . Fibroid   . GERD (gastroesophageal reflux disease)   . Insomnia   . Menopausal symptoms   . Osteoporosis   . Postmenopausal HRT (hormone replacement therapy)   . RA (rheumatoid arthritis) ([place]) 11/2004   methotrexate and Embrel  . Sjogren's disease ([place])   . Sleep apnea 2008   C-Pap  . Vitamin D deficiency     Past Surgical History:  Procedure Laterality Date  . ABDOMINAL HYSTERECTOMY  01/1993   partial with AP repair, secondary to fibroids  . BREAST SURGERY  1991   breast biopsy  . CYSTOCELE REPAIR  10/2007   with graft  . MELANOMA EXCISION Right 01/2012   excision right posterior arm  . NASAL SINUS SURGERY  07/1991  . VAGINAL DELIVERY     x3    There were no vitals filed for this visit.      [facility] PT Assessment - 03/01/18 0001      AROM   Right Shoulder Flexion  140 Degrees    Left Shoulder Flexion  142 Degrees                   [facility] Adult PT Treatment/Exercise - 03/01/18 0001      Shoulder Exercises: Standing   Other Standing Exercises  Band exercises green band x 25  rows , shoulder extension. ER       Shoulder Exercises: ROM/Strengthening   Rebounder  L1 3 min for 3 back              PT Education - 03/01/18 0923    Education Details  REveiwed joint alignment and posture and trial of blue and black band    Person(s) Educated  Patient    Methods  Explanation    Comprehension  Verbalized understanding       PT Short Term Goals - 03/01/18 0925      PT SHORT TERM GOAL #1   Title  Deanna Norris will be indpendent with the initial HEP    Status  Achieved      PT SHORT TERM GOAL #2   Title  Deanna Norris will improve over head reach with abduciton and flexion to 135 degrees    Status  Achieved        PT Long Term Goals - 02/15/18 1119      PT LONG TERM GOAL #1   Title  Deanna Norris will be independent with all HEP issued    Time  6    Period  Weeks    Status  New      PT LONG TERM GOAL #  2   Title  Deanna Norris will report no pain generally with reaching and lifting 5 pounds    Time  6    Period  Weeks    Status  New      PT LONG TERM GOAL #3   Title  Deanna Norris will report able to lye on RT and Lt side for 60 min or more for rest or sleep.     Time  6    Period  Weeks    Status  New            Plan - 03/01/18 2831    Clinical Impression Statement  Doing well with pain controlled and increased reps.   Will try blue band if no problem after today     PT Treatment/Interventions  Taping;Passive range of motion;Manual techniques;Patient/family education;Therapeutic exercise;Moist Heat;Iontophoresis 4mg /ml Dexamethasone;Cryotherapy;Ultrasound    PT Next Visit Plan  Review HEP  ,   band exercises HEP , modalites for pain,  manual     PT Home Exercise Plan  scapula retraction , over head reach, yellow band ER / Ext/ flex  and Hor abduction, attached band row and shoulder extesnion and short range abduction in front and behind hips    Consulted and Agree with Plan of Care  Patient       Patient will benefit from skilled therapeutic intervention in order to improve the following deficits and impairments:  Pain, Postural dysfunction, Impaired UE functional use, Decreased range of  motion, Decreased activity tolerance  Visit Diagnosis: Right shoulder pain, unspecified chronicity  Left shoulder pain, unspecified chronicity  Stiffness of right shoulder, not elsewhere classified  Stiffness of left shoulder, not elsewhere classified     Problem List Patient Active Problem List   Diagnosis Date Noted  . Fatigue 02/17/2018  . Immunocompromised ([place]) 02/17/2018  . 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 Primary osteoarthritis of both hands 01/11/2016  . 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/11/2016  . High risk medication use 11/15/2015  . Sjogren's syndrome ([place]) 10/31/2015  . Chronic constipation 10/23/2015  . Rosacea 10/23/2015  . Rheumatoid arthritis ([place]) 03/29/2015  . Coronary artery calcification seen on CT scan 11/17/2013  . Recurrent sinusitis 09/16/2013  . Encounter for Medicare annual wellness exam 01/31/2013  . Dyspnea 09/14/2012  . Nodule of right lung 09/14/2012  . Hyperlipidemia 05/08/2009  . 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 ASTHMA 05/08/2009  . GERD 05/08/2009  . MENOPAUSAL DISORDER 05/08/2009  . ARTHRITIS, RHEUMATOID 05/08/2009  . Osteoporosis 05/08/2009  . SPONDYLOLISTHESIS 05/08/2009  . Sleep apnea 05/08/2009    Darrel Hoover  PT 03/01/2018, 9:26 AM  [facility] [address] [place], [place], [address] Phone: [phone]   Fax:  [phone]  Name: Deanna Norris MRN: [id number] Date of Birth: 11/06/1945

## 2018-03-03 ENCOUNTER — Telehealth: Payer: Self-pay | Admitting: Pharmacist

## 2018-03-03 ENCOUNTER — Ambulatory Visit: Payer: Medicare Other | Admitting: Physical Therapy

## 2018-03-03 DIAGNOSIS — M25611 Stiffness of right shoulder, not elsewhere classified: Secondary | ICD-10-CM

## 2018-03-03 DIAGNOSIS — R293 Abnormal posture: Secondary | ICD-10-CM | POA: Diagnosis not present

## 2018-03-03 DIAGNOSIS — M25511 Pain in right shoulder: Secondary | ICD-10-CM

## 2018-03-03 DIAGNOSIS — M25612 Stiffness of left shoulder, not elsewhere classified: Secondary | ICD-10-CM

## 2018-03-03 DIAGNOSIS — M25512 Pain in left shoulder: Secondary | ICD-10-CM

## 2018-03-03 NOTE — Therapy (Signed)
[place], [place], [address] Phone: [phone]   Fax:  [phone]  Physical Therapy Treatment  Patient Details  Name: Deanna Norris MRN: [id number] Date of Birth: 30-Mar-1945 Referring Provider (PT): Armanda Heritage, MD   Encounter Date: 03/03/2018  PT End of Session - 03/03/18 1011    Visit Number  5    Number of Visits  12    Date for PT Re-Evaluation  03/26/18    Authorization Type  [name] No PTA    PT Start Time  0933    PT Stop Time  1012    PT Time Calculation (min)  39 min    Activity Tolerance  Patient tolerated treatment well    Behavior During Therapy  [facility] for tasks assessed/performed       Past Medical History:  Diagnosis Date  . Asthma   . Fibroid   . GERD (gastroesophageal reflux disease)   . Insomnia   . Menopausal symptoms   . Osteoporosis   . Postmenopausal HRT (hormone replacement therapy)   . RA (rheumatoid arthritis) ([place]) 11/2004   methotrexate and Embrel  . Sjogren's disease ([place])   . Sleep apnea 2008   C-Pap  . Vitamin D deficiency     Past Surgical History:  Procedure Laterality Date  . ABDOMINAL HYSTERECTOMY  01/1993   partial with AP repair, secondary to fibroids  . BREAST SURGERY  1991   breast biopsy  . CYSTOCELE REPAIR  10/2007   with graft  . MELANOMA EXCISION Right 01/2012   excision right posterior arm  . NASAL SINUS SURGERY  07/1991  . VAGINAL DELIVERY     x3    There were no vitals filed for this visit.  Subjective Assessment - 03/03/18 0943    Subjective  " I am doing pretty good"     Patient Stated Goals  She wants to be able to lye on side and reach with less pain.     Currently in Pain?  Yes    Pain Score  2     Pain Location  Shoulder    Pain Orientation  Right;Left    Pain Descriptors / Indicators  Aching;Sore    Pain Type  Chronic pain    Pain Onset  More than a month ago    Pain Frequency  Intermittent    Aggravating Factors   reaching out shoulder         [facility] PT Assessment - 03/03/18 0001      Assessment   Medical Diagnosis  Bilateral shoulder pain    Referring Provider (PT)  Armanda Heritage, MD                   [facility] Adult PT Treatment/Exercise - 03/03/18 0001      Lumbar Exercises: Seated   Other Seated Lumbar Exercises  seated pelvic tilt 2 x 10 holding core tight      Shoulder Exercises: Seated   Protraction  AAROM;Both;15 reps;Theraband    Theraband Level (Shoulder Protraction)  Level 4 (Blue)    Horizontal ABduction  10 reps   x 2 sets seated on green physioball   Theraband Level (Shoulder Horizontal ABduction)  Level 4 (Blue)    Other Seated Exercises  pulling out on pilates circle 1 x 10 combined with shoulder flexion      Shoulder Exercises: Standing   Protraction  10 reps;Other (comment)   wall push up  Row  12 reps;Theraband    Theraband Level (Shoulder Row)  Level 4 (Blue)    Row Limitations  cues to avoid hiking shoulders    Other Standing Exercises  lower trap wall y's 2 x 10 with lift off      Shoulder Exercises: ROM/Strengthening   Rebounder  L1 3 min for 3 back      Manual Therapy   Manual Therapy  Scapular mobilization    Scapular Mobilization  scapulr upward rotation assist combined with active flexio/ abduction    2 x 10             PT Education - 03/03/18 1010    Education Details  updated HEP for scapular stability exercises.     Person(s) Educated  Patient    Methods  Explanation;Verbal cues;Handout    Comprehension  Verbalized understanding;Verbal cues required       PT Short Term Goals - 03/01/18 0925      PT SHORT TERM GOAL #1   Title  She will be indpendent with the initial HEP    Status  Achieved      PT SHORT TERM GOAL #2   Title  She will improve over head reach with abduciton and flexion to 135 degrees    Status  Achieved        PT Long Term Goals - 02/15/18 1119      PT LONG TERM GOAL #1   Title   She will be independent with all HEP issued    Time  6    Period  Weeks    Status  New      PT LONG TERM GOAL #2   Title  She will report no pain generally with reaching and lifting 5 pounds    Time  6    Period  Weeks    Status  New      PT LONG TERM GOAL #3   Title  She will report able to lye on RT and Lt side for 60 min or more for rest or sleep.     Time  6    Period  Weeks    Status  New            Plan - 03/03/18 1011    Clinical Impression Statement  pt continues to report minimal intermittent soreness that improves with exercises. worked on Psychologist, clinical progressing with blue theraband. worked on scapulohumeral rhythm and pt noted improvement with shoulder flexion and reduced soreness.     PT Treatment/Interventions  Taping;Passive range of motion;Manual techniques;Patient/family education;Therapeutic exercise;Moist Heat;Iontophoresis 4mg /ml Dexamethasone;Cryotherapy;Ultrasound    PT Next Visit Plan  Review HEP  ,   band exercises HEP , modalites for pain,  manual     PT Home Exercise Plan  scapula retraction , over head reach, yellow band ER / Ext/ flex  and Hor abduction, attached band row and shoulder extesnion and short range abduction in front and behind hips, serratus punch (usng wall or band), and lower trap wall Y's    Consulted and Agree with Plan of Care  Patient       Patient will benefit from skilled therapeutic intervention in order to improve the following deficits and impairments:  Pain, Postural dysfunction, Impaired UE functional use, Decreased range of motion, Decreased activity tolerance  Visit Diagnosis: Right shoulder pain, unspecified chronicity  Left shoulder pain, unspecified chronicity  Stiffness of right shoulder, not elsewhere classified  Stiffness of left shoulder, not elsewhere  classified  Abnormal posture     Problem List Patient Active Problem List   Diagnosis Date Noted  . Fatigue 02/17/2018  .  Immunocompromised ([place]) 02/17/2018  . 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 Primary osteoarthritis of both hands 01/11/2016  . 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/11/2016  . High risk medication use 11/15/2015  . Sjogren's syndrome ([place]) 10/31/2015  . Chronic constipation 10/23/2015  . Rosacea 10/23/2015  . Rheumatoid arthritis ([place]) 03/29/2015  . Coronary artery calcification seen on CT scan 11/17/2013  . Recurrent sinusitis 09/16/2013  . Encounter for Medicare annual wellness exam 01/31/2013  . Dyspnea 09/14/2012  . Nodule of right lung 09/14/2012  . Hyperlipidemia 05/08/2009  . 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 ASTHMA 05/08/2009  . GERD 05/08/2009  . MENOPAUSAL DISORDER 05/08/2009  . ARTHRITIS, RHEUMATOID 05/08/2009  . Osteoporosis 05/08/2009  . SPONDYLOLISTHESIS 05/08/2009  . Sleep apnea 05/08/2009    Starr Lake PT, DPT, LAT, ATC  03/03/18  10:15 AM      [facility] [address] [place], [place], [address] Phone: [phone]   Fax:  [phone]  Name: Deanna Norris MRN: [id number] Date of Birth: 1945/02/12

## 2018-03-03 NOTE — Telephone Encounter (Signed)
Received bone density results from [facility] on 02/05/2018.  Question whether the results are accurate as there is a 12% positive change of bone mineral density in the right hip and -13% change in bone mineral density of the left hip.  Have called on 4 previous occasions and left voicemails asking to recalculate BMD.  Left another voicemail today to please return call and that we would like the office to recalculate BMD in order to help guide our therapy decision.

## 2018-03-04 NOTE — Telephone Encounter (Signed)
Return Mary Ann's call to discuss bone density scan.  Our office understands that the calculation might be correct but looking for some insight on how there can be such a discrepancy between 1 hip versus the other.  It is difficult to determine what direction to take her treatment in.  Audrea Muscat understands her frustration and will reach out to Dr. Hamilton Capri to discuss and offer [place] further insight.  Dr. Hamilton Capri will not be in the office until Thursday and will give [place] a return call then.

## 2018-03-04 NOTE — Telephone Encounter (Signed)
Received voicemail from [facility].  She states that she tried to contact our office and was unable to reach me.  Also states that she sent a fax to notify us the response from the provider.  After looking at the scan again the provider believes that the BMD and percent changes are accurate.  Deanna Norris encouraged [place] to reach out if we need any other information or have questions or concerns.

## 2018-03-08 ENCOUNTER — Ambulatory Visit: Payer: Medicare Other | Attending: Physician Assistant

## 2018-03-08 DIAGNOSIS — M25612 Stiffness of left shoulder, not elsewhere classified: Secondary | ICD-10-CM

## 2018-03-08 DIAGNOSIS — M25512 Pain in left shoulder: Secondary | ICD-10-CM | POA: Insufficient documentation

## 2018-03-08 DIAGNOSIS — M25611 Stiffness of right shoulder, not elsewhere classified: Secondary | ICD-10-CM | POA: Insufficient documentation

## 2018-03-08 DIAGNOSIS — M25511 Pain in right shoulder: Secondary | ICD-10-CM | POA: Insufficient documentation

## 2018-03-08 NOTE — Therapy (Signed)
[place], [place], [address] Phone: [phone]   Fax:  [phone]  Physical Therapy Treatment  Patient Details  Name: Deanna Norris MRN: [id number] Date of Birth: March 22, 1945 Referring Provider (PT): Armanda Heritage, MD   Encounter Date: 03/08/2018  PT End of Session - 03/08/18 0923    Visit Number  6    Number of Visits  12    Date for PT Re-Evaluation  03/26/18    Authorization Type  [employer] [name] No PTA    PT Start Time  0922    PT Stop Time  1000    PT Time Calculation (min)  38 min    Activity Tolerance  Patient tolerated treatment well    Behavior During Therapy  [facility] for tasks assessed/performed       Past Medical History:  Diagnosis Date  . Asthma   . Fibroid   . GERD (gastroesophageal reflux disease)   . Insomnia   . Menopausal symptoms   . Osteoporosis   . Postmenopausal HRT (hormone replacement therapy)   . RA (rheumatoid arthritis) ([place]) 11/2004   methotrexate and Embrel  . Sjogren's disease ([place])   . Sleep apnea 2008   C-Pap  . Vitamin D deficiency     Past Surgical History:  Procedure Laterality Date  . ABDOMINAL HYSTERECTOMY  01/1993   partial with AP repair, secondary to fibroids  . BREAST SURGERY  1991   breast biopsy  . CYSTOCELE REPAIR  10/2007   with graft  . MELANOMA EXCISION Right 01/2012   excision right posterior arm  . NASAL SINUS SURGERY  07/1991  . VAGINAL DELIVERY     x3    There were no vitals filed for this visit.  Subjective Assessment - 03/08/18 1012    Subjective  Dpoing well and exercises have helped . Still limited wiuth lying on LT side but better and hip more problematic with this.     Currently in Pain?  No/denies                       [facility] Adult PT Treatment/Exercise - 03/08/18 0001      Shoulder Exercises: Seated   Other Seated Exercises  row and pull downs on machines for possible options when going to gym      Shoulder Exercises: Standing   Protraction  Right;Left;10 reps    Other Standing Exercises  wall th and lift at full range with scap retract /depression      Shoulder Exercises: Therapy Ball   Other Therapy Ball Exercises  Blue band x 12-15 rpes [place] RT/LT          Shoulder Exercises: ROM/Strengthening   Rebounder  --    UBE (Upper Arm Bike)  L2 3 min for 3 back               PT Short Term Goals - 03/01/18 0925      PT SHORT TERM GOAL #1   Title  She will be indpendent with the initial HEP    Status  Achieved      PT SHORT TERM GOAL #2   Title  She will improve over head reach with abduciton and flexion to 135 degrees    Status  Achieved        PT Long Term Goals - 03/08/18 1006      PT LONG TERM GOAL #1   Title  She will be  independent with all HEP issued    Status  On-going      PT LONG TERM GOAL #2   Title  She will report no pain generally with reaching and lifting 5 pounds    Baseline  improving  , need to test next session    Status  Partially Met      PT LONG TERM GOAL #3   Title  She will report able to lye on RT and Lt side for 60 min or more for rest or sleep.     Baseline  limited due to hip pain but this is improved    Status  Partially Met              Patient will benefit from skilled therapeutic intervention in order to improve the following deficits and impairments:     Visit Diagnosis: Right shoulder pain, unspecified chronicity  Left shoulder pain, unspecified chronicity  Stiffness of right shoulder, not elsewhere classified  Stiffness of left shoulder, not elsewhere classified     Problem List Patient Active Problem List   Diagnosis Date Noted  . Fatigue 02/17/2018  . Immunocompromised ([place]) 02/17/2018  . 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 Primary osteoarthritis of both hands 01/11/2016  .  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/11/2016  . High risk medication use 11/15/2015  . Sjogren's syndrome ([place]) 10/31/2015  . Chronic constipation 10/23/2015  . Rosacea 10/23/2015  . Rheumatoid arthritis ([place]) 03/29/2015  . Coronary artery calcification seen on CT scan 11/17/2013  . Recurrent sinusitis 09/16/2013  . Encounter for Medicare annual wellness exam 01/31/2013  . Dyspnea 09/14/2012  . Nodule of right lung 09/14/2012  . Hyperlipidemia 05/08/2009  . 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 ASTHMA 05/08/2009  . GERD 05/08/2009  . MENOPAUSAL DISORDER 05/08/2009  . ARTHRITIS, RHEUMATOID 05/08/2009  . Osteoporosis 05/08/2009  . SPONDYLOLISTHESIS 05/08/2009  . Sleep apnea 05/08/2009    Darrel Hoover  PT 03/08/2018, 10:12 AM  [facility] [address] [place], [place], [address] Phone: [phone]   Fax:  [phone]  Name: Deanna Norris MRN: [id number] Date of Birth: 07-20-1945

## 2018-03-10 ENCOUNTER — Encounter: Payer: Self-pay | Admitting: Physical Therapy

## 2018-03-10 ENCOUNTER — Ambulatory Visit: Payer: Medicare Other | Admitting: Physical Therapy

## 2018-03-10 DIAGNOSIS — M25511 Pain in right shoulder: Secondary | ICD-10-CM

## 2018-03-10 DIAGNOSIS — M25512 Pain in left shoulder: Secondary | ICD-10-CM

## 2018-03-10 DIAGNOSIS — M25612 Stiffness of left shoulder, not elsewhere classified: Secondary | ICD-10-CM

## 2018-03-10 DIAGNOSIS — M25611 Stiffness of right shoulder, not elsewhere classified: Secondary | ICD-10-CM

## 2018-03-10 NOTE — Therapy (Signed)
Deanna Norris, [place], [address] Phone: [phone]   Fax:  [phone]  Physical Therapy Treatment / Discharge Summary  Patient Details  Name: Deanna Norris MRN: [id number] Date of Birth: 01-25-1945 Referring Provider (PT): Armanda Heritage, MD   Encounter Date: 03/10/2018  PT End of Session - 03/10/18 0930    Visit Number  7    Number of Visits  12    Date for PT Re-Evaluation  03/26/18    Authorization Type  [employer] Deanna Norris No PTA    PT Start Time  0930    PT Stop Time  1002   shortened session due to discharge   PT Time Calculation (min)  32 min    Activity Tolerance  Patient tolerated treatment well    Behavior During Therapy  Deanna Norris for tasks assessed/performed       Past Medical History:  Diagnosis Date  . Asthma   . Fibroid   . GERD (gastroesophageal reflux disease)   . Insomnia   . Menopausal symptoms   . Osteoporosis   . Postmenopausal HRT (hormone replacement therapy)   . RA (rheumatoid arthritis) (Deanna Norris) 11/2004   methotrexate and Embrel  . Sjogren's disease (Deanna Norris)   . Sleep apnea 2008   C-Pap  . Vitamin D deficiency     Past Surgical History:  Procedure Laterality Date  . ABDOMINAL HYSTERECTOMY  01/1993   partial with AP repair, secondary to fibroids  . BREAST SURGERY  1991   breast biopsy  . CYSTOCELE REPAIR  10/2007   with graft  . MELANOMA EXCISION Right 01/2012   excision right posterior arm  . NASAL SINUS SURGERY  07/1991  . VAGINAL DELIVERY     x3    There were no vitals filed for this visit.  Subjective Assessment - 03/10/18 0933    Subjective  "no issues in the shoulder"    Currently in Pain?  No/denies    Pain Score  0-No pain    Aggravating Factors   reaching out the side and up, as well as laying on it         [facility] PT Assessment - 03/10/18 0001      Assessment   Medical Diagnosis  Bilateral shoulder pain    Referring Provider (PT)  Armanda Heritage, MD      Observation/Other Assessments   Focus on Therapeutic Outcomes (FOTO)   34% limited                   [facility] Adult PT Treatment/Exercise - 03/10/18 0001      Self-Care   Self-Care  Posture    Posture  sleeping positing and posturing to avoid pain inthe shoulder or hip      Shoulder Exercises: ROM/Strengthening   UBE (Upper Arm Bike)  L1 x 4 min    changing direction at 2 min     Shoulder Exercises: Stretch   Other Shoulder Stretches  rhomboid stretching 2 x 30 sec with arms crossed             PT Education - 03/10/18 1001    Education Details  reviewed previously provided HEp and updated. importance of continued strengthening adding reps/ sets and resistance to promote endurance. sleeping posture and positioning.    Person(s) Educated  Patient    Methods  Explanation;Verbal cues;Handout;Demonstration    Comprehension  Verbalized understanding;Verbal cues required;Returned demonstration       PT Short Term  Goals - 03/01/18 0925      PT SHORT TERM GOAL #1   Title  She will be indpendent with the initial HEP    Status  Achieved      PT SHORT TERM GOAL #2   Title  She will improve over head reach with abduciton and flexion to 135 degrees    Status  Achieved        PT Long Term Goals - 03/10/18 4132      PT LONG TERM GOAL #1   Title  She will be independent with all HEP issued    Time  6    Period  Weeks      PT LONG TERM GOAL #2   Title  She will report no pain generally with reaching and lifting 5 pounds    Baseline  mild soreness rated at 1/10 with reaching and lifting 5#    Time  6    Period  Weeks    Status  Partially Met      PT LONG TERM GOAL #3   Title  She will report able to lye on RT and Lt side for 60 min or more for rest or sleep.     Baseline  limited due to hip pain but this is improved    Time  6    Period  Weeks    Status  Achieved            Plan - 03/10/18 1002    Clinical Impression Statement  [name] reports minimal soreness with reaching out that is improved with shoulder stretching and strengthening. She met or partially met all goals today. She performed exercises and stretching well today with no report of aggrivation. She is able to maintain and progress her current level of function independently and will be discharged from PT today.     PT Treatment/Interventions  Taping;Passive range of motion;Manual techniques;Patient/family education;Therapeutic exercise;Moist Heat;Iontophoresis 60m/ml Dexamethasone;Cryotherapy;Ultrasound    PT Next Visit Plan  D/C    PT Home Exercise Plan  scapula retraction , over head reach, yellow band ER / Ext/ flex  and Hor abduction, attached band row and shoulder extesnion and short range abduction in front and behind hips, serratus punch (usng wall or band), and lower trap wall Y's, Rhomboid stretch    Consulted and Agree with Plan of Care  Patient       Patient will benefit from skilled therapeutic intervention in order to improve the following deficits and impairments:  Pain, Postural dysfunction, Impaired UE functional use, Decreased range of motion, Decreased activity tolerance  Visit Diagnosis: Right shoulder pain, unspecified chronicity  Left shoulder pain, unspecified chronicity  Stiffness of right shoulder, not elsewhere classified  Stiffness of left shoulder, not elsewhere classified     Problem List Patient Active Problem List   Diagnosis Date Noted  . Fatigue 02/17/2018  . Immunocompromised ([place] 02/17/2018  . 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 Primary osteoarthritis of both hands 01/11/2016  . 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/11/2016  . High risk medication use 11/15/2015  . S[place]ren's syndrome ([place] 10/31/2015  . Chronic constipation 10/23/2015  . Rosacea 10/23/2015  . Rheumatoid arthritis ([place]  03/29/2015  . Coronary artery calcification seen on CT scan 11/17/2013  . Recurrent sinusitis 09/16/2013  . Encounter for Medicare annual wellness exam 01/31/2013  . Dyspnea 09/14/2012  . Nodule of right lung  09/14/2012  . Hyperlipidemia 05/08/2009  . 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 ASTHMA 05/08/2009  . GERD 05/08/2009  . MENOPAUSAL DISORDER 05/08/2009  . ARTHRITIS, RHEUMATOID 05/08/2009  . Osteoporosis 05/08/2009  . SPONDYLOLISTHESIS 05/08/2009  . Sleep apnea 05/08/2009    [place] 03/10/2018, 10:05 AM  [facility] [address] [place], [place], [address] Phone: [phone]   Fax:  [phone]  Name: HEATH BADON MRN: [id number] Date of Birth: November 07, 1945      PHYSICAL THERAPY DISCHARGE SUMMARY  Visits from Start of Care: 7  Current functional level related to goals / functional outcomes: See goals, FOTO 36% limited   Remaining deficits: Mild soreness noted with reaching forward and to the side. See assessment   Education / Equipment: HEP, theraband, posture/positioning  Plan: Patient agrees to discharge.  Patient goals were partially met. Patient is being discharged due to being pleased with the current functional level.  ?????    Zeferino Mounts PT, DPT, LAT, ATC  03/10/18  10:06 AM

## 2018-03-17 NOTE — Telephone Encounter (Signed)
Per Dr. Estanislado Pandy okay to continue Reclast infusions. Repeat Dexa in 1 year.

## 2018-03-23 ENCOUNTER — Encounter: Payer: Self-pay | Admitting: Rheumatology

## 2018-03-26 ENCOUNTER — Other Ambulatory Visit: Payer: Self-pay

## 2018-03-26 ENCOUNTER — Telehealth: Payer: Self-pay | Admitting: Rheumatology

## 2018-03-26 DIAGNOSIS — Z79899 Other long term (current) drug therapy: Secondary | ICD-10-CM | POA: Diagnosis not present

## 2018-03-26 MED ORDER — METHOTREXATE SODIUM CHEMO INJECTION 50 MG/2ML: 20.0000 mg | INTRAMUSCULAR | 0 refills | Status: DC

## 2018-03-26 MED ORDER — ORENCIA 125 MG/ML ~~LOC~~ SOSY: PREFILLED_SYRINGE | SUBCUTANEOUS | 2 refills | Status: DC

## 2018-03-26 NOTE — Telephone Encounter (Signed)
Patient request refill on [place],  Sent to [facility], and MTX sent to [facility]. Patient also requesting Bone Density results if possible. Patient did see results on My Chart.

## 2018-03-26 NOTE — Telephone Encounter (Signed)
Last visit: 02/02/18 Next Visit: 07/06/18 Labs: 12/22/17 WNL Tb Gold: 03/03/17 Neg  Labs updated today  Okay to refill per Dr. Estanislado Pandy

## 2018-03-27 LAB — CBC WITH DIFFERENTIAL/PLATELET
Absolute Monocytes: 819 cells/uL (ref 200–950)
Basophils Absolute: 98 cells/uL (ref 0–200)
Basophils Relative: 1.5 %
Eosinophils Absolute: 429 cells/uL (ref 15–500)
Eosinophils Relative: 6.6 %
HCT: 45.3 % — ABNORMAL HIGH (ref 35.0–45.0)
Hemoglobin: 15.6 g/dL — ABNORMAL HIGH (ref 11.7–15.5)
Lymphs Abs: 2054 cells/uL (ref 850–3900)
MCH: 32.6 pg (ref 27.0–33.0)
MCHC: 34.4 g/dL (ref 32.0–36.0)
MCV: 94.6 fL (ref 80.0–100.0)
MPV: 10.8 fL (ref 7.5–12.5)
Monocytes Relative: 12.6 %
NEUTROS ABS: 3101 {cells}/uL (ref 1500–7800)
Neutrophils Relative %: 47.7 %
Platelets: 246 10*3/uL (ref 140–400)
RBC: 4.79 10*6/uL (ref 3.80–5.10)
RDW: 14 % (ref 11.0–15.0)
Total Lymphocyte: 31.6 %
WBC: 6.5 10*3/uL (ref 3.8–10.8)

## 2018-03-27 LAB — COMPLETE METABOLIC PANEL WITH GFR
AG Ratio: 1.6 (calc) (ref 1.0–2.5)
ALT: 25 U/L (ref 6–29)
AST: 25 U/L (ref 10–35)
Albumin: 3.9 g/dL (ref 3.6–5.1)
Alkaline phosphatase (APISO): 80 U/L (ref 37–153)
BUN: 13 mg/dL (ref 7–25)
CO2: 28 mmol/L (ref 20–32)
Calcium: 9.7 mg/dL (ref 8.6–10.4)
Chloride: 106 mmol/L (ref 98–110)
Creat: 0.67 mg/dL (ref 0.60–0.93)
GFR, Est African American: 102 mL/min/{1.73_m2} (ref >=60)
GFR, Est Non African American: 88 mL/min/{1.73_m2} (ref >=60)
Globulin: 2.5 g/dL (calc) (ref 1.9–3.7)
Glucose, Bld: 80 mg/dL (ref 65–99)
POTASSIUM: 4.8 mmol/L (ref 3.5–5.3)
Sodium: 142 mmol/L (ref 135–146)
Total Bilirubin: 0.4 mg/dL (ref 0.2–1.2)
Total Protein: 6.4 g/dL (ref 6.1–8.1)

## 2018-03-27 LAB — QUANTIFERON-TB GOLD PLUS
Mitogen-NIL: 9.05 IU/mL
NIL: 0.05 IU/mL
QuantiFERON-TB Gold Plus: NEGATIVE
TB1-NIL: 0.02 IU/mL
TB2-NIL: 0.01 IU/mL

## 2018-04-08 ENCOUNTER — Encounter: Payer: Self-pay | Admitting: Family Medicine

## 2018-04-08 ENCOUNTER — Ambulatory Visit (INDEPENDENT_AMBULATORY_CARE_PROVIDER_SITE_OTHER): Payer: Medicare Other

## 2018-04-08 ENCOUNTER — Ambulatory Visit (INDEPENDENT_AMBULATORY_CARE_PROVIDER_SITE_OTHER): Payer: Medicare Other | Admitting: Family Medicine

## 2018-04-08 ENCOUNTER — Other Ambulatory Visit: Payer: Self-pay

## 2018-04-08 DIAGNOSIS — K219 Gastro-esophageal reflux disease without esophagitis: Secondary | ICD-10-CM

## 2018-04-08 DIAGNOSIS — J309 Allergic rhinitis, unspecified: Secondary | ICD-10-CM | POA: Insufficient documentation

## 2018-04-08 DIAGNOSIS — Z0000 Encounter for general adult medical examination without abnormal findings: Secondary | ICD-10-CM | POA: Diagnosis not present

## 2018-04-08 DIAGNOSIS — J301 Allergic rhinitis due to pollen: Secondary | ICD-10-CM

## 2018-04-08 MED ORDER — FLUTICASONE PROPIONATE 50 MCG/ACT NA SUSP: NASAL | 3 refills | Status: DC

## 2018-04-08 MED ORDER — FAMOTIDINE 40 MG PO TABS: 40.0000 mg | ORAL_TABLET | Freq: Two times a day (BID) | ORAL | 3 refills | Status: DC

## 2018-04-08 NOTE — Patient Instructions (Signed)
Deanna Norris , Thank you for taking time to come for your Medicare Wellness Visit. I appreciate your ongoing commitment to your health goals. Please review the following plan we discussed and let me know if I can assist you in the future.   These are the goals we discussed: Goals    . Increase physical activity     Starting 04/08/18, I will continue to exercise for 20-30 minutes 5 days per week.        This is a list of the screening recommended for you and due dates:  Health Maintenance  Topic Date Due  . Flu Shot  08/07/2018  . Mammogram  02/05/2019  . Colon Cancer Screening  01/13/2023  . Tetanus Vaccine  03/24/2023  . DEXA scan (bone density measurement)  Completed  .  Hepatitis C: One time screening is recommended by Center for Disease Control  (CDC) for  adults born from [birth year] through 1965.   Completed  . Pneumonia vaccines  Completed   Preventive Care for Adults  A healthy lifestyle and preventive care can promote health and wellness. Preventive health guidelines for adults include the following key practices.  . A routine yearly physical is a good way to check with your health care provider about your health and preventive screening. It is a chance to share any concerns and updates on your health and to receive a thorough exam.  . Visit your dentist for a routine exam and preventive care every 6 months. Brush your teeth twice a day and floss once a day. Good oral hygiene prevents tooth decay and gum disease.  . The frequency of eye exams is based on your age, health, family medical history, use  of contact lenses, and other factors. Follow your health care provider's recommendations for frequency of eye exams.  . Eat a healthy diet. Foods like vegetables, fruits, whole grains, low-fat dairy products, and lean protein foods contain the nutrients you need without too many calories. Decrease your intake of foods high in solid fats, added sugars, and salt. Eat the right amount of  calories for you. Get information about a proper diet from your health care provider, if necessary.  . Regular physical exercise is one of the most important things you can do for your health. Most adults should get at least 150 minutes of moderate-intensity exercise (any activity that increases your heart rate and causes you to sweat) each week. In addition, most adults need muscle-strengthening exercises on 2 or more days a week.  Silver Sneakers may be a benefit available to you. To determine eligibility, you may visit the website: www.silversneakers.com or contact program at 947-060-5680 Mon-Fri between 8AM-8PM.   . Maintain a healthy weight. The body mass index (BMI) is a screening tool to identify possible weight problems. It provides an estimate of body fat based on height and weight. Your health care provider can find your BMI and can help you achieve or maintain a healthy weight.   For adults 20 years and older: ? A BMI below 18.5 is considered underweight. ? A BMI of 18.5 to 24.9 is normal. ? A BMI of 25 to 29.9 is considered overweight. ? A BMI of 30 and above is considered obese.   . Maintain normal blood lipids and cholesterol levels by exercising and minimizing your intake of saturated fat. Eat a balanced diet with plenty of fruit and vegetables. Blood tests for lipids and cholesterol should begin at age 51 and be repeated every 5 years.  If your lipid or cholesterol levels are high, you are over 50, or you are at high risk for heart disease, you may need your cholesterol levels checked more frequently. Ongoing high lipid and cholesterol levels should be treated with medicines if diet and exercise are not working.  . If you smoke, find out from your health care provider how to quit. If you do not use tobacco, please do not start.  . If you choose to drink alcohol, please do not consume more than 2 drinks per day. One drink is considered to be 12 ounces (355 mL) of beer, 5 ounces  (148 mL) of wine, or 1.5 ounces (44 mL) of liquor.  . If you are 35-2 years old, ask your health care provider if you should take aspirin to prevent strokes.  . Use sunscreen. Apply sunscreen liberally and repeatedly throughout the day. You should seek shade when your shadow is shorter than you. Protect yourself by wearing long sleeves, pants, a wide-brimmed hat, and sunglasses year round, whenever you are outdoors.  . Once a month, do a whole body skin exam, using a mirror to look at the skin on your back. Tell your health care provider of new moles, moles that have irregular borders, moles that are larger than a pencil eraser, or moles that have changed in shape or color.

## 2018-04-08 NOTE — Progress Notes (Signed)
Subjective:   Deanna Norris is a 73 y.o. female who presents for Medicare Annual (Subsequent) preventive examination.  Review of Systems:  N/A Cardiac Risk Factors include: advanced age (>79mn, >>43women);dyslipidemia     Objective:     Vitals: LMP 01/06/1993   There is no height or weight on file to calculate BMI.  Advanced Directives 02/15/2018 04/01/2017 03/31/2016 03/28/2015 03/28/2015  Does Patient Have a Medical Advance Directive? Yes Yes Yes Yes Yes  Type of AParamedicof AWinifredLiving will;Out of facility DNR (pink MOST or yellow form) [place]Living will [place]Living will [place]Living will [place]Living will  Does patient want to make changes to medical advance directive? - - - No - Patient declined No - Patient declined  Copy of HHillsidein Chart? No - copy requested No - copy requested No - copy requested No - copy requested No - copy requested    Tobacco Social History   Tobacco Use  Smoking Status Never Smoker  Smokeless Tobacco Never Used     Counseling given: No   Clinical Intake:  Pre-visit preparation completed: Yes  Pain : No/denies pain     Nutritional Risks: None Diabetes: No  How often do you need to have someone help you when you read instructions, pamphlets, or other written materials from your doctor or pharmacy?: 1 - Never What is the last grade level you completed in school?: 12TH GRADE  Interpreter Needed?: No  Comments: pt lives with spouse Information entered by :: LPinson, LPN  Past Medical History:  Diagnosis Date  . Asthma   . Fibroid   . GERD (gastroesophageal reflux disease)   . Insomnia   . Menopausal symptoms   . Osteoporosis   . Postmenopausal HRT (hormone replacement therapy)   . RA (rheumatoid arthritis) ([place] 11/2004   methotrexate and Embrel  . Sjogren's disease ([place]   . Sleep  apnea 2008   C-Pap  . Vitamin D deficiency    Past Surgical History:  Procedure Laterality Date  . ABDOMINAL HYSTERECTOMY  01/1993   partial with AP repair, secondary to fibroids  . BREAST SURGERY  1991   breast biopsy  . CYSTOCELE REPAIR  10/2007   with graft  . MELANOMA EXCISION Right 01/2012   excision right posterior arm  . NASAL SINUS SURGERY  07/1991  . VAGINAL DELIVERY     x3   Family History  Problem Relation Age of Onset  . Breast cancer Sister 64 . Thyroid disease Sister   . Cancer Sister 633      Leukemia--CLL  . Heart disease Brother    Social History   Socioeconomic History  . Marital status: Married    Spouse name: Not on file  . Number of children: Not on file  . Years of education: Not on file  . Highest education level: Not on file  Occupational History  . Occupation: housewife    Employer: Not Employed  Social Needs  . Financial resource strain: Not on file  . Food insecurity:    Worry: Not on file    Inability: Not on file  . Transportation needs:    Medical: Not on file    Non-medical: Not on file  Tobacco Use  . Smoking status: Never Smoker  . Smokeless tobacco: Never Used  Substance and Sexual Activity  . Alcohol use: No    Alcohol/week: 0.0 standard  drinks  . Drug use: No  . Sexual activity: Yes    Partners: Male    Birth control/protection: Surgical    Comment: TAH  Lifestyle  . Physical activity:    Days per week: Not on file    Minutes per session: Not on file  . Stress: Not on file  Relationships  . Social connections:    Talks on phone: Not on file    Gets together: Not on file    Attends religious service: Not on file    Active member of club or organization: Not on file    Attends meetings of clubs or organizations: Not on file    Relationship status: Not on file  Other Topics Concern  . Not on file  Social History Narrative  . Not on file    Outpatient Encounter Medications as of 04/08/2018  Medication Sig  .  albuterol (PROAIR HFA) 108 (90 BASE) MCG/ACT inhaler Inhale 2 puffs into the lungs every 4 (four) hours as needed.  Marland Kitchen aspirin 81 MG tablet Take 81 mg by mouth daily.   . Calcium Carbonate Antacid (TUMS PO) Take by mouth as needed.  . Calcium Carbonate-Vitamin D (CALCIUM 500/D PO) Take 1 tablet by mouth 2 (two) times daily.  . Cholecalciferol (VITAMIN D3) 2000 UNITS TABS Take 1 tablet by mouth daily.    Marland Kitchen desonide (DESOWEN) 0.05 % lotion Apply topically as needed.  . diclofenac sodium (VOLTAREN) 1 % GEL Apply 1 application topically at bedtime as needed.   Mariane Baumgarten Calcium (STOOL SOFTENER PO) Take 1 capsule by mouth 2 (two) times daily.  . Estradiol (YUVAFEM) 10 MCG TABS vaginal tablet INSERT 1 TABLET VAGINALLY TWICE A WEEK  . fluticasone (FLONASE) 50 MCG/ACT nasal spray USE 2 SPRAYS IN EACH NOSTRIL DAILY AS NEEDED FOR RHINITIS OR ALLERGIES (Patient taking differently: daily. USE 2 SPRAYS IN EACH NOSTRIL DAILY AS NEEDED FOR RHINITIS OR ALLERGIES)  . folic acid (FOLVITE) 1 MG tablet Take 2 tablets (2 mg total) by mouth daily.  . hydrocortisone 2.5 % ointment Apply 1 application topically as needed.   . Ibuprofen 200 MG CAPS Take 3 capsules by mouth as needed.  . linaclotide (LINZESS) 72 MCG capsule Take 72 mcg by mouth daily as needed.  . Magnesium Malate 1250 (141.7 Mg) MG TABS Take 3 tablets by mouth daily.   . methotrexate 50 MG/2ML injection Inject 0.8 mLs (20 mg total) into the skin once a week.  . Multiple Vitamin (MULTI-VITAMINS) TABS Take 1 tablet by mouth daily.  . Omega-3 1000 MG CAPS Take 1 tablet by mouth daily.  Marland Kitchen ORENCIA 125 MG/ML SOSY INJECT THE CONTENTS OF 1 SYRINGE UNDER THE SKIN EVERY WEEK  . Tuberculin-Allergy Syringes 27G X 1/2" 1 ML KIT Inject 1 Syringe into the skin once a week. To be used weekly with methotrexate.  . TURMERIC CURCUMIN PO Take by mouth daily.  . zoledronic acid (RECLAST) 5 MG/100ML SOLN injection Inject 5 mg into the vein once. Takes yearly  . zolpidem  (AMBIEN) 10 MG tablet Take 0.5 tablets (5 mg total) by mouth at bedtime as needed for sleep.   No facility-administered encounter medications on file as of 04/08/2018.     Activities of Daily Living In your present state of health, do you have any difficulty performing the following activities: 04/08/2018  Hearing? N  Vision? N  Difficulty concentrating or making decisions? N  Walking or climbing stairs? N  Dressing or bathing? N  Doing errands, shopping?  N  Preparing Food and eating ? N  Using the Toilet? N  In the past six months, have you accidently leaked urine? N  Do you have problems with loss of bowel control? N  Managing your Medications? N  Managing your Finances? N  Housekeeping or managing your Housekeeping? N  Some recent data might be hidden    Patient Care Team: Tower, [name], MD as PCP - General Jarome Matin, MD as Consulting Physician (Dermatology) Juanita Craver, MD as Consulting Physician (Gastroenterology) Marica Otter, OD as Consulting Physician (Optometry) Belva Crome, MD as Consulting Physician (Cardiology)    Assessment:   This is a routine wellness examination for [name].  Vision Screening Comments: Last vision exam in Summer 2019 with Dr. Marica Otter  Exercise Activities and Dietary recommendations Current Exercise Habits: Home exercise routine, Type of exercise: walking;yoga, Time (Minutes): 30, Frequency (Times/Week): 5, Weekly Exercise (Minutes/Week): 150, Intensity: Mild, Exercise limited by: None identified  Goals    . Increase physical activity     Starting 04/08/18, I will continue to exercise for 20-30 minutes 5 days per week.        Fall Risk Fall Risk  04/08/2018 04/01/2017 03/31/2016 03/28/2015 02/13/2014  Falls in the past year? 0 No No No No  Number falls in past yr: - - - - -  Risk Factor Category  - - - - -   Depression Screen PHQ 2/9 Scores 04/08/2018 04/01/2017 03/31/2016 03/28/2015  PHQ - 2 Score 0 0 0 0  PHQ- 9 Score 0 0 - -      Cognitive Function MMSE - Mini Mental State Exam 04/08/2018 04/01/2017 03/31/2016 03/28/2015  Not completed: Unable to complete - - -  Orientation to time - 5 5 5   Orientation to Place - 5 5 5   Registration - 3 3 3   Attention/ Calculation - 0 0 0  Recall - 3 3 3   Language- name 2 objects - 0 0 0  Language- repeat - 1 1 1   Language- follow 3 step command - 3 3 3   Language- read & follow direction - 0 0 0  Write a sentence - 0 0 0  Copy design - 0 0 0  Total score - 20 20 20      PLEASE NOTE: A Mini-Cog screen was completed. Maximum score is 20. A value of 0 denotes this part of [place] MMSE was not completed or the patient failed this part of the Mini-Cog screening.   Mini-Cog Screening Orientation to Time - Max 5 pts Orientation to Place - Max 5 pts Registration - Max 3 pts Recall - Max 3 pts Language Repeat - Max 1 pts Language Follow 3 Step Command - Max 3 pts     Immunization History  Administered Date(s) Administered  . DTaP 04/06/2013  . Influenza Split 10/22/2010  . Influenza Whole 10/10/2009, 10/20/2011  . Influenza, High Dose Seasonal PF 10/14/2016  . Influenza, Seasonal, Injecte, Preservative Fre 10/06/2012  . Influenza,inj,Quad PF,6+ Mos 10/30/2014  . Influenza,inj,quad, With Preservative 10/12/2017  . Influenza-Unspecified 10/26/2013, 10/22/2015  . Pneumococcal Conjugate-13 02/13/2014  . Pneumococcal Polysaccharide-23 06/12/2010, 03/28/2015  . Td 02/07/2003, 03/23/2013  . Tdap 03/23/2013  . Zoster 05/08/2014   Screening Tests Health Maintenance  Topic Date Due  . INFLUENZA VACCINE  08/07/2018  . MAMMOGRAM  02/05/2019  . COLONOSCOPY  01/13/2023  . TETANUS/TDAP  03/24/2023  . DEXA SCAN  Completed  . Hepatitis C Screening  Completed  . PNA vac Low Risk  Adult  Completed      Plan:     I have personally reviewed, addressed, and noted the following in the patient's chart:  A. Medical and social history B. Use of alcohol, tobacco or illicit drugs  C.  Current medications and supplements D. Functional ability and status E.  Nutritional status F.  Physical activity G. Advance directives H. List of other physicians I.  Hospitalizations, surgeries, and ER visits in previous 12 months J.  [place] to include hearing, vision, cognitive, depression L. Referrals and appointments - none  In addition, I have reviewed and discussed with patient certain preventive protocols, quality metrics, and best practice recommendations. A written personalized care plan for preventive services as well as general preventive health recommendations were provided to patient.  See attached scanned questionnaire for additional information.   Signed,   Lindell Noe, MHA, BS, LPN Health Coach

## 2018-04-08 NOTE — Assessment & Plan Note (Signed)
Since ranitidine was removed- she is not having as good control with pepcid 20 bid  Will inc to 40 bid If no imp-consider change to omeprazole Disc diet/will avoid over eating  Also avoid acidic bev and caffeine  Update if not starting to improve in a week or if worsening

## 2018-04-08 NOTE — Progress Notes (Signed)
Virtual Visit via Video Note  I connected with Deanna Norris on 04/08/18 at 10:00 AM EDT by a video enabled telemedicine application and verified that I am speaking with the correct person using two identifiers.   I discussed the limitations of evaluation and management by telemedicine and the availability of in person appointments. The patient expressed understanding and agreed to proceed.  I was in office and patient was in her home.    Lindell Noe, LPN

## 2018-04-08 NOTE — Progress Notes (Signed)
PCP notes:   Health maintenance:  No gaps identified.  Abnormal screenings:   None  Patient concerns:   A. Chronic pain in hip and right leg. Pain scale: 2/10. Takes Tylenol and Ibuprofen OTC at bedtime.  B. Numbness in right foot.  C. GERD - unable to take Zantac OTC. Taking Famotidine 20 mg 1 tab in AM and 1 tab in PM. Not as effective as Zantac. Would like to discuss alternative medication with PCP.  D. Medication refill request for Fluticasone spray.   Nurse concerns:  None  Next PCP appt:   04/08/18 @ 1200  I reviewed health advisor's note, was available for consultation, and agree with documentation and plan. Loura Pardon MD

## 2018-04-08 NOTE — Assessment & Plan Note (Signed)
Refilled flonase Will avoid pollen if possible

## 2018-04-08 NOTE — Progress Notes (Signed)
Virtual Visit via Video Note  I connected with [name] on 04/08/18 at 12:15 PM EDT by a video enabled telemedicine application and verified that I am speaking with the correct person using two identifiers. The patient is at home  I am at my office    I discussed the limitations of evaluation and management by telemedicine and the availability of in person appointments. The patient expressed understanding and agreed to proceed.  History of Present Illness: Struggling with her acid reflux , also needs flonase refilled for nasal allergies  Was previously taking ranitidine otc  It was pulled  Then she started taking pepcid 20 mg bid -not working as well  Supplements with tums as needed   She has tried omeprazole in the past  Symptoms- burning in her throat  Not at bedtime   She does take ibuprofen or tylenol at night time for RA 400 mg ibuprofen  Not a lot of coffee= uses decaf 1 cup per day  2 cups of tea (decaf) per day  Does not eat spicy food  Overeating makes her worse    Review of Systems  Constitutional: Negative for chills, diaphoresis, fever, malaise/fatigue and weight loss.  HENT: Negative for sore throat.   Eyes: Negative for discharge.  Respiratory: Negative for cough, shortness of breath and wheezing.   Cardiovascular: Negative for chest pain and palpitations.  Gastrointestinal: Negative for abdominal pain, constipation, diarrhea, nausea and vomiting.  Skin: Negative for itching and rash.  Neurological: Negative for headaches.  Psychiatric/Behavioral: Negative for depression. The patient is not nervous/anxious.      Patient Active Problem List   Diagnosis Date Noted  . Allergic rhinitis 04/08/2018  . Fatigue 02/17/2018  . Immunocompromised ([place]) 02/17/2018  . 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 Primary osteoarthritis of both hands  01/11/2016  . 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/11/2016  . High risk medication use 11/15/2015  . Sjogren's syndrome ([place]) 10/31/2015  . Chronic constipation 10/23/2015  . Rosacea 10/23/2015  . Rheumatoid arthritis ([place]) 03/29/2015  . Coronary artery calcification seen on CT scan 11/17/2013  . Recurrent sinusitis 09/16/2013  . Encounter for Medicare annual wellness exam 01/31/2013  . Dyspnea 09/14/2012  . Nodule of right lung 09/14/2012  . Hyperlipidemia 05/08/2009  . 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 ASTHMA 05/08/2009  . GERD 05/08/2009  . MENOPAUSAL DISORDER 05/08/2009  . ARTHRITIS, RHEUMATOID 05/08/2009  . Osteoporosis 05/08/2009  . SPONDYLOLISTHESIS 05/08/2009  . Sleep apnea 05/08/2009   Past Medical History:  Diagnosis Date  . Asthma   . Fibroid   . GERD (gastroesophageal reflux disease)   . Insomnia   . Menopausal symptoms   . Osteoporosis   . Postmenopausal HRT (hormone replacement therapy)   . RA (rheumatoid arthritis) ([place]) 11/2004   methotrexate and Embrel  . Sjogren's disease ([place])   . Sleep apnea 2008   C-Pap  . Vitamin D deficiency    Past Surgical History:  Procedure Laterality Date  . ABDOMINAL HYSTERECTOMY  01/1993   partial with AP repair, secondary to fibroids  . BREAST SURGERY  1991   breast biopsy  . CYSTOCELE REPAIR  10/2007   with graft  . MELANOMA EXCISION Right 01/2012   excision right posterior arm  . NASAL SINUS SURGERY  07/1991  . VAGINAL DELIVERY     x3   Social History   Tobacco Use  . Smoking status:  Never Smoker  . Smokeless tobacco: Never Used  Substance Use Topics  . Alcohol use: No    Alcohol/week: 0.0 standard drinks  . Drug use: No   Family History  Problem Relation Age of Onset  . Breast cancer Sister 67  . Thyroid disease Sister   . Cancer Sister 61       Leukemia--CLL  . Heart disease Brother    Allergies  Allergen Reactions  . Adalimumab     REACTION: frequent sinus infection  . Diclofenac     unk  .  Dicyclomine     Turn teeth gray  . Doxycycline Hyclate Other (See Comments)    "turns teeth gray"  . Enbrel [Etanercept]     SINUS INFECTIONS  . Fosamax [Alendronate Sodium]     Acid reflux  . Methotrexate Derivatives Nausea Only    Oral metotrexate  . Nickel   . Piroxicam     unk  . Salagen [Pilocarpine Hcl] Other (See Comments)    Excessive sweating, headaches  . Sulfamethoxazole-Trimethoprim     REACTION: face and hands rash and felt burning sensation  . Tramadol Hcl     REACTION: nausea  . Plaquenil [Hydroxychloroquine] Rash   Current Outpatient Medications on File Prior to Visit  Medication Sig Dispense Refill  . acetaminophen (TYLENOL) 500 MG tablet Take 500 mg by mouth every 8 (eight) hours as needed.    Marland Kitchen albuterol (PROAIR HFA) 108 (90 BASE) MCG/ACT inhaler Inhale 2 puffs into the lungs every 4 (four) hours as needed. 1 Inhaler 3  . aspirin 81 MG tablet Take 81 mg by mouth daily.     . Calcium Carbonate Antacid (TUMS PO) Take by mouth as needed.    . Calcium Carbonate-Vitamin D (CALCIUM 500/D PO) Take 1 tablet by mouth 2 (two) times daily.    . Cholecalciferol (VITAMIN D3) 2000 UNITS TABS Take 1 tablet by mouth daily.      Marland Kitchen desonide (DESOWEN) 0.05 % lotion Apply topically as needed.    . diclofenac sodium (VOLTAREN) 1 % GEL Apply 1 application topically at bedtime as needed.     Mariane Baumgarten Calcium (STOOL SOFTENER PO) Take 3 capsules by mouth every morning.     . Estradiol (YUVAFEM) 10 MCG TABS vaginal tablet INSERT 1 TABLET VAGINALLY TWICE A WEEK 24 tablet 3  . folic acid (FOLVITE) 1 MG tablet Take 2 tablets (2 mg total) by mouth daily. 180 tablet 3  . hydrocortisone 2.5 % ointment Apply 1 application topically as needed.     . Ibuprofen 200 MG CAPS Take 3 capsules by mouth as needed.    . linaclotide (LINZESS) 72 MCG capsule Take 72 mcg by mouth daily as needed.    . Magnesium Malate 1250 (141.7 Mg) MG TABS Take 3 tablets by mouth daily.     . methotrexate 50 MG/2ML  injection Inject 0.8 mLs (20 mg total) into the skin once a week. 10 mL 0  . Multiple Vitamin (MULTI-VITAMINS) TABS Take 1 tablet by mouth daily.    . Omega-3 1000 MG CAPS Take 1 tablet by mouth daily.    Marland Kitchen ORENCIA 125 MG/ML SOSY INJECT THE CONTENTS OF 1 SYRINGE UNDER THE SKIN EVERY WEEK 4 mL 2  . Tuberculin-Allergy Syringes 27G X 1/2" 1 ML KIT Inject 1 Syringe into the skin once a week. To be used weekly with methotrexate.    . TURMERIC CURCUMIN PO Take by mouth daily.    . zoledronic acid (RECLAST) 5  MG/100ML SOLN injection Inject 5 mg into the vein once. Takes yearly    . zolpidem (AMBIEN) 10 MG tablet Take 0.5 tablets (5 mg total) by mouth at bedtime as needed for sleep. 30 tablet 0   No current facility-administered medications on file prior to visit.       Observations/Objective: Patient appears well, is talkative and pleasant  Apparently feeling good  No pallor or change in skin tone  No hoarseness    Assessment and Plan: Problem List Items Addressed This Visit      Respiratory   Allergic rhinitis    Refilled flonase Will avoid pollen if possible         Digestive   GERD - Primary    Since ranitidine was removed- she is not having as good control with pepcid 20 bid  Will inc to 40 bid If no imp-consider change to omeprazole Disc diet/will avoid over eating  Also avoid acidic bev and caffeine  Update if not starting to improve in a week or if worsening        Relevant Medications   famotidine (PEPCID) 40 MG tablet        Follow Up Instructions: Avoid nsaids when possible Watch diet Try pepcid 40 mg twice daily (if no improvement we can try omeprazole) I sent in flonase for allergies      I discussed the assessment and treatment plan with the patient. The patient was provided an opportunity to ask questions and all were answered. The patient agreed with the plan and demonstrated an understanding of the instructions.   The patient was advised to call back or  seek an in-person evaluation if the symptoms worsen or if the condition fails to improve as anticipated.     Loura Pardon, MD

## 2018-04-12 ENCOUNTER — Ambulatory Visit: Payer: Medicare Other

## 2018-04-13 ENCOUNTER — Other Ambulatory Visit: Payer: Medicare Other

## 2018-04-13 ENCOUNTER — Ambulatory Visit: Payer: Medicare Other | Admitting: Hematology

## 2018-04-14 ENCOUNTER — Encounter: Payer: Medicare Other | Admitting: Family Medicine

## 2018-05-03 DIAGNOSIS — M5416 Radiculopathy, lumbar region: Secondary | ICD-10-CM | POA: Diagnosis not present

## 2018-05-24 DIAGNOSIS — M5416 Radiculopathy, lumbar region: Secondary | ICD-10-CM | POA: Diagnosis not present

## 2018-05-27 DIAGNOSIS — M5416 Radiculopathy, lumbar region: Secondary | ICD-10-CM | POA: Diagnosis not present

## 2018-06-01 DIAGNOSIS — M5416 Radiculopathy, lumbar region: Secondary | ICD-10-CM | POA: Diagnosis not present

## 2018-06-03 DIAGNOSIS — M5416 Radiculopathy, lumbar region: Secondary | ICD-10-CM | POA: Diagnosis not present

## 2018-06-07 ENCOUNTER — Telehealth: Payer: Self-pay | Admitting: Family Medicine

## 2018-06-07 DIAGNOSIS — R5382 Chronic fatigue, unspecified: Secondary | ICD-10-CM

## 2018-06-07 DIAGNOSIS — M810 Age-related osteoporosis without current pathological fracture: Secondary | ICD-10-CM

## 2018-06-07 DIAGNOSIS — E7800 Pure hypercholesterolemia, unspecified: Secondary | ICD-10-CM

## 2018-06-07 NOTE — Telephone Encounter (Signed)
-----   Message from Ellamae Sia sent at 06/04/2018  4:34 PM EDT ----- Regarding: Lab orders for Wednesday, 6.3.20 Patient is scheduled for CPX labs, please order future labs, Thanks , Terri   Has some orders, wanted to make sur you didn't need to add to them

## 2018-06-08 DIAGNOSIS — M5416 Radiculopathy, lumbar region: Secondary | ICD-10-CM | POA: Diagnosis not present

## 2018-06-09 ENCOUNTER — Other Ambulatory Visit (INDEPENDENT_AMBULATORY_CARE_PROVIDER_SITE_OTHER): Payer: Medicare Other

## 2018-06-09 DIAGNOSIS — M810 Age-related osteoporosis without current pathological fracture: Secondary | ICD-10-CM | POA: Diagnosis not present

## 2018-06-09 DIAGNOSIS — R5382 Chronic fatigue, unspecified: Secondary | ICD-10-CM | POA: Diagnosis not present

## 2018-06-09 DIAGNOSIS — E7800 Pure hypercholesterolemia, unspecified: Secondary | ICD-10-CM | POA: Diagnosis not present

## 2018-06-09 LAB — CBC WITH DIFFERENTIAL/PLATELET
Basophils Absolute: 0.1 10*3/uL (ref 0.0–0.1)
Basophils Relative: 1.3 % (ref 0.0–3.0)
Eosinophils Absolute: 0.5 10*3/uL (ref 0.0–0.7)
Eosinophils Relative: 7.4 % — ABNORMAL HIGH (ref 0.0–5.0)
HCT: 44.8 % (ref 36.0–46.0)
Hemoglobin: 15.5 g/dL — ABNORMAL HIGH (ref 12.0–15.0)
Lymphocytes Relative: 35.4 % (ref 12.0–46.0)
Lymphs Abs: 2.2 10*3/uL (ref 0.7–4.0)
MCHC: 34.5 g/dL (ref 30.0–36.0)
MCV: 95.2 fl (ref 78.0–100.0)
Monocytes Absolute: 0.7 10*3/uL (ref 0.1–1.0)
Monocytes Relative: 12 % (ref 3.0–12.0)
Neutro Abs: 2.7 10*3/uL (ref 1.4–7.7)
Neutrophils Relative %: 43.9 % (ref 43.0–77.0)
Platelets: 254 10*3/uL (ref 150.0–400.0)
RBC: 4.71 Mil/uL (ref 3.87–5.11)
RDW: 15 % (ref 11.5–15.5)
WBC: 6.1 10*3/uL (ref 4.0–10.5)

## 2018-06-09 LAB — LIPID PANEL
Cholesterol: 228 mg/dL — ABNORMAL HIGH (ref 0–200)
HDL: 68.1 mg/dL (ref >39.00)
LDL Cholesterol: 134 mg/dL — ABNORMAL HIGH (ref 0–99)
NonHDL: 160.35
Total CHOL/HDL Ratio: 3
Triglycerides: 133 mg/dL (ref 0.0–149.0)
VLDL: 26.6 mg/dL (ref 0.0–40.0)

## 2018-06-09 LAB — COMPREHENSIVE METABOLIC PANEL
ALT: 21 U/L (ref 0–35)
AST: 21 U/L (ref 0–37)
Albumin: 3.7 g/dL (ref 3.5–5.2)
Alkaline Phosphatase: 67 U/L (ref 39–117)
BUN: 13 mg/dL (ref 6–23)
CO2: 29 mEq/L (ref 19–32)
Calcium: 9.1 mg/dL (ref 8.4–10.5)
Chloride: 105 mEq/L (ref 96–112)
Creatinine, Ser: 0.72 mg/dL (ref 0.40–1.20)
GFR: 79.48 mL/min (ref >60.00)
Glucose, Bld: 90 mg/dL (ref 70–99)
Potassium: 4.3 mEq/L (ref 3.5–5.1)
Sodium: 140 mEq/L (ref 135–145)
Total Bilirubin: 0.6 mg/dL (ref 0.2–1.2)
Total Protein: 6.1 g/dL (ref 6.0–8.3)

## 2018-06-09 LAB — VITAMIN D 25 HYDROXY (VIT D DEFICIENCY, FRACTURES): VITD: 63.79 ng/mL (ref 30.00–100.00)

## 2018-06-09 LAB — TSH: TSH: 1.72 u[IU]/mL (ref 0.35–4.50)

## 2018-06-10 DIAGNOSIS — M5416 Radiculopathy, lumbar region: Secondary | ICD-10-CM | POA: Diagnosis not present

## 2018-06-11 ENCOUNTER — Telehealth: Payer: Self-pay | Admitting: *Deleted

## 2018-06-11 NOTE — Telephone Encounter (Signed)
Patient advised and appt made for in office visit

## 2018-06-11 NOTE — Telephone Encounter (Signed)
If she has no infectious symptoms I would like to see her in the office

## 2018-06-11 NOTE — Telephone Encounter (Signed)
Patient called stating that she has had SOB for months with activity.  Patient stated that she does not have any Coivd -19 symptoms with the exception of SOB that has been going on for a long time. Patient stated that she did go to [name] 2 weeks ago and visited her grandchildren. Patient stated that she has been scheduled for a virtual visit 06/16/18, but patient thinks that she may need to come in so Dr. Glori Bickers can  listen to her heart and direct her. Patient stated that she is on a lot of medication that can cause SOB. Patient requested a call back as to what Dr. Glori Bickers recommends?

## 2018-06-14 DIAGNOSIS — M5416 Radiculopathy, lumbar region: Secondary | ICD-10-CM | POA: Diagnosis not present

## 2018-06-16 ENCOUNTER — Ambulatory Visit (INDEPENDENT_AMBULATORY_CARE_PROVIDER_SITE_OTHER): Payer: Medicare Other | Admitting: Family Medicine

## 2018-06-16 ENCOUNTER — Other Ambulatory Visit: Payer: Self-pay

## 2018-06-16 ENCOUNTER — Encounter: Payer: Self-pay | Admitting: Family Medicine

## 2018-06-16 VITALS — BP 114/72 | HR 67 | Temp 98.1°F | Ht 66.0 in | Wt 175.1 lb

## 2018-06-16 DIAGNOSIS — M810 Age-related osteoporosis without current pathological fracture: Secondary | ICD-10-CM | POA: Diagnosis not present

## 2018-06-16 DIAGNOSIS — K219 Gastro-esophageal reflux disease without esophagitis: Secondary | ICD-10-CM | POA: Diagnosis not present

## 2018-06-16 DIAGNOSIS — R0609 Other forms of dyspnea: Secondary | ICD-10-CM | POA: Diagnosis not present

## 2018-06-16 DIAGNOSIS — E7800 Pure hypercholesterolemia, unspecified: Secondary | ICD-10-CM

## 2018-06-16 DIAGNOSIS — M0579 Rheumatoid arthritis with rheumatoid factor of multiple sites without organ or systems involvement: Secondary | ICD-10-CM

## 2018-06-16 DIAGNOSIS — K5909 Other constipation: Secondary | ICD-10-CM | POA: Diagnosis not present

## 2018-06-16 DIAGNOSIS — R0600 Dyspnea, unspecified: Secondary | ICD-10-CM

## 2018-06-16 MED ORDER — LINACLOTIDE 72 MCG PO CAPS: 72.0000 ug | ORAL_CAPSULE | Freq: Every day | ORAL | 0 refills | Status: DC | PRN

## 2018-06-16 NOTE — Assessment & Plan Note (Signed)
Per pt relatively stable Sees Dr Garen Grams Taking methotrexate Maureen Chatters

## 2018-06-16 NOTE — Assessment & Plan Note (Signed)
pepcid is now working better as long as she watches her diet  Plans to switch back to zantac if/when it comes back on the market

## 2018-06-16 NOTE — Patient Instructions (Signed)
Continue to exercise and work on conditioning  If no further improvement in shortness of breath let me know   Talk to Dr Garen Grams about your breathing  Take care of yourself  Eat a healthy diet   For cholesterol  Avoid red meat/ fried foods/ egg yolks/ fatty breakfast meats/ butter, cheese and high fat dairy/ and shellfish

## 2018-06-16 NOTE — Progress Notes (Signed)
Subjective:    Patient ID: Deanna Norris, female    DOB: [birth year], 73 y.o.   MRN: [id number]  HPI Here for annual f/u of chronic medical problems   Feeling ok overall  She has sob -chronic  She has been walking more during the pandemic (improved) Stairs are difficult -gets  This began a year ago   Sob is only on exertion  Getting better with more exercise/conditioning  She wheezes if she gets uri -not recently  No chest pain or pressure No leg pain    Cardiology 2015 CT of chest 2015  ? Small airway issues    Weight  Wt Readings from Last 3 Encounters:  06/16/18 175 lb 2 oz (79.4 kg)  02/17/18 178 lb 3 oz (80.8 kg)  02/02/18 182 lb 6.4 oz (82.7 kg)  down 3 lb  Walking and eating fairly healthy  28.27 kg/m   amw 04/08/18 No gaps or concerns  Mammogram 1/20 Self breast exam -no lumps or changes   Colonoscopy 1/15   dexa 1/20- OP  D level is 63 -good reclast 2 y -still on this  Discussed prolia with Dr Garen Grams -may start that    GERD-pepcid does not work as well as zantac  Better than it was in April  Taking 40 mg bid    RA Sees Dr Garen Grams Methotrexate  Doing fairly well  Has not disc sob with her    Hyperlipidemia Lab Results  Component Value Date   CHOL 228 (H) 06/09/2018   CHOL 220 (H) 04/01/2017   CHOL 196 03/24/2016   Lab Results  Component Value Date   HDL 68.10 06/09/2018   HDL 67.30 04/01/2017   HDL 64.40 03/24/2016   Lab Results  Component Value Date   LDLCALC 134 (H) 06/09/2018   LDLCALC 131 (H) 04/01/2017   LDLCALC 115 (H) 03/24/2016   Lab Results  Component Value Date   TRIG 133.0 06/09/2018   TRIG 105.0 04/01/2017   TRIG 85.0 03/24/2016   Lab Results  Component Value Date   CHOLHDL 3 06/09/2018   CHOLHDL 3 04/01/2017   CHOLHDL 3 03/24/2016   Lab Results  Component Value Date   LDLDIRECT 144.2 02/02/2013   LDLDIRECT 137.3 02/02/2012   Diet is fair  Red meat - 1-2 times per week  No fried  foods Very little cheese   Patient Active Problem List   Diagnosis Date Noted   Allergic rhinitis 04/08/2018   Fatigue 02/17/2018   Immunocompromised ([place]) 02/17/2018   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   Primary osteoarthritis of both hands 01/11/2016   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/11/2016   High risk medication use 11/15/2015   Sjogren's syndrome ([place]) 10/31/2015   Chronic constipation 10/23/2015   Rosacea 10/23/2015   Rheumatoid arthritis ([place]) 03/29/2015   Coronary artery calcification seen on CT scan 11/17/2013   Recurrent sinusitis 09/16/2013   Encounter for Medicare annual wellness exam 01/31/2013   Dyspnea 09/14/2012   Nodule of right lung 09/14/2012   Hyperlipidemia 05/08/2009   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   ASTHMA 05/08/2009   GERD 05/08/2009   MENOPAUSAL DISORDER 05/08/2009   ARTHRITIS, RHEUMATOID 05/08/2009   Osteoporosis 05/08/2009   SPONDYLOLISTHESIS 05/08/2009   Sleep apnea 05/08/2009   Past Medical History:  Diagnosis Date   Asthma    Fibroid    GERD (gastroesophageal reflux disease)    Insomnia  Menopausal symptoms    Osteoporosis    Postmenopausal HRT (hormone replacement therapy)    RA (rheumatoid arthritis) ([place]) 11/2004   methotrexate and Embrel   Sjogren's disease ([place])    Sleep apnea 2008   C-Pap   Vitamin D deficiency    Past Surgical History:  Procedure Laterality Date   ABDOMINAL HYSTERECTOMY  01/1993   partial with AP repair, secondary to fibroids   BREAST SURGERY  1991   breast biopsy   CYSTOCELE REPAIR  10/2007   with graft   MELANOMA EXCISION Right 01/2012   excision right posterior arm   NASAL SINUS SURGERY  07/1991   VAGINAL DELIVERY     x3   Social History   Tobacco Use   Smoking status: Never Smoker   Smokeless tobacco: Never Used  Substance Use  Topics   Alcohol use: No    Alcohol/week: 0.0 standard drinks   Drug use: No   Family History  Problem Relation Age of Onset   Breast cancer Sister 45   Thyroid disease Sister    Cancer Sister 23       Leukemia--CLL   Heart disease Brother    Allergies  Allergen Reactions   Adalimumab     REACTION: frequent sinus infection   Diclofenac     unk   Dicyclomine     Turn teeth gray   Doxycycline Hyclate Other (See Comments)    "turns teeth gray"   Enbrel [Etanercept]     SINUS INFECTIONS   Fosamax [Alendronate Sodium]     Acid reflux   Methotrexate Derivatives Nausea Only    Oral metotrexate   Nickel    Piroxicam     unk   Salagen [Pilocarpine Hcl] Other (See Comments)    Excessive sweating, headaches   Sulfamethoxazole-Trimethoprim     REACTION: face and hands rash and felt burning sensation   Tramadol Hcl     REACTION: nausea   Plaquenil [Hydroxychloroquine] Rash   Current Outpatient Medications on File Prior to Visit  Medication Sig Dispense Refill   acetaminophen (TYLENOL) 500 MG tablet Take 500 mg by mouth every 8 (eight) hours as needed.     albuterol (PROAIR HFA) 108 (90 BASE) MCG/ACT inhaler Inhale 2 puffs into the lungs every 4 (four) hours as needed. 1 Inhaler 3   aspirin 81 MG tablet Take 81 mg by mouth daily.      Calcium Carbonate Antacid (TUMS PO) Take by mouth as needed.     Calcium Carbonate-Vitamin D (CALCIUM 500/D PO) Take 1 tablet by mouth 2 (two) times daily.     Cholecalciferol (VITAMIN D3) 2000 UNITS TABS Take 1 tablet by mouth daily.       desonide (DESOWEN) 0.05 % lotion Apply topically as needed.     diclofenac sodium (VOLTAREN) 1 % GEL Apply 1 application topically at bedtime as needed.      Docusate Calcium (STOOL SOFTENER PO) Take 3 capsules by mouth every morning.      Estradiol (YUVAFEM) 10 MCG TABS vaginal tablet INSERT 1 TABLET VAGINALLY TWICE A WEEK 24 tablet 3   famotidine (PEPCID) 40 MG tablet Take 1  tablet (40 mg total) by mouth 2 (two) times daily. 180 tablet 3   fluticasone (FLONASE) 50 MCG/ACT nasal spray USE 2 SPRAYS IN EACH NOSTRIL DAILY AS NEEDED FOR RHINITIS OR ALLERGIES 48 g 3   folic acid (FOLVITE) 1 MG tablet Take 2 tablets (2 mg total) by mouth daily. 180 tablet 3  hydrocortisone 2.5 % ointment Apply 1 application topically as needed.      Ibuprofen 200 MG CAPS Take 3 capsules by mouth as needed.     Magnesium Malate 1250 (141.7 Mg) MG TABS Take 3 tablets by mouth daily.      methotrexate 50 MG/2ML injection Inject 0.8 mLs (20 mg total) into the skin once a week. 10 mL 0   Multiple Vitamin (MULTI-VITAMINS) TABS Take 1 tablet by mouth daily.     Omega-3 1000 MG CAPS Take 1 tablet by mouth daily.     ORENCIA 125 MG/ML SOSY INJECT THE CONTENTS OF 1 SYRINGE UNDER THE SKIN EVERY WEEK 4 mL 2   Tuberculin-Allergy Syringes 27G X 1/2" 1 ML KIT Inject 1 Syringe into the skin once a week. To be used weekly with methotrexate.     TURMERIC CURCUMIN PO Take by mouth daily.     zoledronic acid (RECLAST) 5 MG/100ML SOLN injection Inject 5 mg into the vein once. Takes yearly     zolpidem (AMBIEN) 10 MG tablet Take 0.5 tablets (5 mg total) by mouth at bedtime as needed for sleep. 30 tablet 0   No current facility-administered medications on file prior to visit.     Review of Systems  Constitutional: Negative for activity change, appetite change, fatigue, fever and unexpected weight change.  HENT: Negative for congestion, rhinorrhea, sore throat and trouble swallowing.        Dry mouth  Eyes: Negative for pain, redness, itching and visual disturbance.  Respiratory: Positive for shortness of breath. Negative for cough, choking, chest tightness, wheezing and stridor.   Cardiovascular: Negative for chest pain and palpitations.  Gastrointestinal: Negative for abdominal pain, blood in stool, constipation, diarrhea and nausea.  Endocrine: Negative for cold intolerance, heat intolerance,  polydipsia and polyuria.  Genitourinary: Negative for difficulty urinating, dysuria, frequency and urgency.  Musculoskeletal: Positive for arthralgias and back pain. Negative for joint swelling and myalgias.  Skin: Negative for pallor and rash.  Neurological: Negative for dizziness, tremors, weakness, numbness and headaches.  Hematological: Negative for adenopathy. Does not bruise/bleed easily.  Psychiatric/Behavioral: Negative for decreased concentration and dysphoric mood. The patient is not nervous/anxious.        Objective:   Physical Exam Constitutional:      General: She is not in acute distress.    Appearance: Normal appearance. She is well-developed and normal weight. She is not ill-appearing.  HENT:     Head: Normocephalic and atraumatic.     Right Ear: Tympanic membrane, ear canal and external ear normal.     Left Ear: Tympanic membrane, ear canal and external ear normal.     Nose: Nose normal.     Mouth/Throat:     Mouth: Mucous membranes are moist.     Pharynx: Oropharynx is clear. No posterior oropharyngeal erythema.  Eyes:     General: No scleral icterus.       Right eye: No discharge.        Left eye: No discharge.     Conjunctiva/sclera: Conjunctivae normal.     Pupils: Pupils are equal, round, and reactive to light.  Neck:     Musculoskeletal: Normal range of motion and neck supple. No muscular tenderness.     Thyroid: No thyromegaly.     Vascular: No carotid bruit or JVD.  Cardiovascular:     Rate and Rhythm: Normal rate and regular rhythm.     Pulses: Normal pulses.     Heart sounds: Normal heart sounds.  No gallop.   Pulmonary:     Effort: Pulmonary effort is normal. No respiratory distress.     Breath sounds: Normal breath sounds. No stridor. No wheezing, rhonchi or rales.     Comments: Good air exch Abdominal:     General: Bowel sounds are normal. There is no distension.     Palpations: Abdomen is soft. There is no mass.     Tenderness: There is no  abdominal tenderness.  Genitourinary:    Comments: Sees gyn Musculoskeletal:        General: No tenderness.  Lymphadenopathy:     Cervical: No cervical adenopathy.  Skin:    General: Skin is warm and dry.     Coloration: Skin is not pale.     Findings: No erythema or rash.     Comments: Solar lentigines diffusely Also sks   Neurological:     Mental Status: She is alert.     Cranial Nerves: No cranial nerve deficit.     Motor: No abnormal muscle tone.     Coordination: Coordination normal.     Gait: Gait normal.     Deep Tendon Reflexes: Reflexes are normal and symmetric. Reflexes normal.  Psychiatric:        Mood and Affect: Mood normal.           Assessment & Plan:   Problem List Items Addressed This Visit      Digestive   GERD    pepcid is now working better as long as she watches her diet  Plans to switch back to zantac if/when it comes back on the market      Relevant Medications   linaclotide (LINZESS) 72 MCG capsule   Chronic constipation    Pt uses linzess prn-not often No side effects Good fluid and fiber intake        Musculoskeletal and Integument   Osteoporosis    Pt is currently on reclast Some improvement with last dexa  Her rheumatologist manages  Considering Prolia - will d/w Dr Garen Grams      Rheumatoid arthritis [facility])    Per pt relatively stable Sees Dr Garen Grams Taking methotrexate Deanna Norris          Other   Hyperlipidemia    Disc goals for lipids and reasons to control them Rev last labs with pt Rev low sat fat diet in detail  Diet control LDL is 134-goal less than 130 (even better under 100) If this goes up may consider statin      Dyspnea on exertion - Primary    Improving significantly with a regular walking program  No other symptoms  Suspect from deconditioning  inst to alert [place] if no further improvement with time/exercise She will also d/w her rheumatologist in light of RA and medicines

## 2018-06-16 NOTE — Assessment & Plan Note (Signed)
Disc goals for lipids and reasons to control them Rev last labs with pt Rev low sat fat diet in detail  Diet control LDL is 134-goal less than 130 (even better under 100) If this goes up may consider statin

## 2018-06-16 NOTE — Assessment & Plan Note (Signed)
Improving significantly with a regular walking program  No other symptoms  Suspect from deconditioning  inst to alert [place] if no further improvement with time/exercise She will also d/w her rheumatologist in light of RA and medicines

## 2018-06-16 NOTE — Assessment & Plan Note (Signed)
Pt is currently on reclast Some improvement with last dexa  Her rheumatologist manages  Considering Prolia - will d/w Dr Garen Grams

## 2018-06-16 NOTE — Assessment & Plan Note (Signed)
Pt uses linzess prn-not often No side effects Good fluid and fiber intake

## 2018-06-17 ENCOUNTER — Other Ambulatory Visit: Payer: Self-pay | Admitting: Rheumatology

## 2018-06-17 DIAGNOSIS — M5416 Radiculopathy, lumbar region: Secondary | ICD-10-CM | POA: Diagnosis not present

## 2018-06-18 ENCOUNTER — Telehealth: Payer: Self-pay | Admitting: Family Medicine

## 2018-06-18 NOTE — Telephone Encounter (Signed)
Ok to refill folic acid 

## 2018-06-18 NOTE — Telephone Encounter (Signed)
Abnormal labs from Dr. Theodosia Blender office 06/09/2018, Last visit 02/02/2018, next visit 07/13/2018. Last prescribed 08/11/2017. Please advise.

## 2018-06-18 NOTE — Telephone Encounter (Signed)
Patient stated that she was contacted by the pharmacy in regards to her [facility].  She stated they told her PA was needed for the medication but the patient  stated that she did not need this PA started because she does not really need this medication.   PHONE- [phone]

## 2018-06-29 ENCOUNTER — Other Ambulatory Visit: Payer: Self-pay | Admitting: *Deleted

## 2018-06-29 ENCOUNTER — Telehealth: Payer: Self-pay

## 2018-06-29 DIAGNOSIS — M810 Age-related osteoporosis without current pathological fracture: Secondary | ICD-10-CM

## 2018-06-29 NOTE — Telephone Encounter (Signed)
Please check status of patient's reclast infusion. Last reclast infusion on 06/29/2017.    note on 03/17/2018 Deanna Binning, LPN Note   Per Dr. Estanislado Pandy okay to continue Reclast infusions. Repeat Dexa in 1 year.

## 2018-06-29 NOTE — Progress Notes (Signed)
Office Visit Note  Patient: Deanna Norris             Date of Birth: 07/31/1945           MRN: [id number]             PCP: Abner Greenspan, MD Referring: Tower, [name], MD Visit Date: 07/13/2018 Occupation: @GUAROCC @  Subjective:  Pain in both hands   History of Present Illness: [name] is a 73 y.o. female with history of seropositive rheumatoid arthritis, osteoarthritis, and osteoporosis.  She is prescribed Orencia 125 mg sq every week, MTX 0.8 ml sq once weekly, and folic acid 2 mg po daily.  She recently went to a family reunion, so she held her medication for 3 weeks due to the concern for covid-19.  She is planning on resuming this week.  She continues to have pain in bilateral hands, bilateral knee joints, bilateral feet.  She denies any joint swelling.  She states her morning stiffness is only lasting 5 to 10 minutes.  She states that her knee joints only bother her when going up and down steps.  She is not ready to proceed with Euflexxa injections at this time but will notify us when she is ready.   She states that she has a history of vertebral fractures in her lumbar spine.  She states that physical therapy has helped her lower back pain significantly.  She states that the right sided radiculopathy has resolved.  She is also been walking 20 to 40 minutes daily.  She reports that she has been experiencing shortness of breath with exertion for the past 9 months.  She states over the past several months shortness of breath has progressively been getting worse despite trying to walk for exercise.  She reports in the past she is seen Dr. [name] but has been over 5 years.  She denies any other pulmonary symptoms at this time.   She reports that last week she had her third Reclast infusion.  She continues to take calcium and vitamin D.   Activities of Daily Living:  Patient reports morning stiffness for 5-10 minutes.   Patient Reports nocturnal pain.  Difficulty  dressing/grooming: Denies Difficulty climbing stairs: Reports Difficulty getting out of chair: Denies Difficulty using hands for taps, buttons, cutlery, and/or writing: Denies  Review of Systems  Constitutional: Negative for fatigue.  HENT: Positive for mouth dryness. Negative for mouth sores and nose dryness.   Eyes: Positive for dryness. Negative for pain and visual disturbance.  Respiratory: Negative for cough, hemoptysis, shortness of breath and difficulty breathing.   Cardiovascular: Negative for chest pain, palpitations, hypertension and swelling in legs/feet.  Gastrointestinal: Positive for constipation. Negative for blood in stool and diarrhea.  Endocrine: Negative for increased urination.  Genitourinary: Negative for painful urination.  Musculoskeletal: Positive for arthralgias, joint pain and morning stiffness. Negative for joint swelling, myalgias, muscle weakness, muscle tenderness and myalgias.  Skin: Negative for color change, pallor, rash, hair loss, nodules/bumps, skin tightness, ulcers and sensitivity to sunlight.  Allergic/Immunologic: Negative for susceptible to infections.  Neurological: Negative for dizziness, numbness, headaches and weakness.  Hematological: Negative for swollen glands.  Psychiatric/Behavioral: Positive for sleep disturbance (Ambien ). Negative for depressed mood. The patient is not nervous/anxious.     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 02/17/2018   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   Primary osteoarthritis of both hands 01/11/2016   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/11/2016   High risk medication use 11/15/2015   Sjogren's syndrome ([place]) 10/31/2015   Chronic constipation 10/23/2015   Rosacea 10/23/2015    Rheumatoid arthritis ([place]) 03/29/2015   Coronary artery calcification seen on CT scan 11/17/2013   Recurrent sinusitis 09/16/2013   Encounter for Medicare annual wellness exam 01/31/2013   Dyspnea on exertion 09/14/2012   Nodule of right lung 09/14/2012   Hyperlipidemia 05/08/2009   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   ASTHMA 05/08/2009   GERD 05/08/2009   MENOPAUSAL DISORDER 05/08/2009   ARTHRITIS, RHEUMATOID 05/08/2009   Osteoporosis 05/08/2009   SPONDYLOLISTHESIS 05/08/2009   Sleep apnea 05/08/2009    Past Medical History:  Diagnosis Date   Asthma    Fibroid    GERD (gastroesophageal reflux disease)    Insomnia    Menopausal symptoms    Osteoporosis    Postmenopausal HRT (hormone replacement therapy)    RA (rheumatoid arthritis) ([place]) 11/2004   methotrexate and Embrel   Sjogren's disease ([place])    Sleep apnea 2008   C-Pap   Vitamin D deficiency     Family History  Problem Relation Age of Onset   Breast cancer Sister 20   Thyroid disease Sister    Cancer Sister 35       Leukemia--CLL   Heart disease Brother    Past Surgical History:  Procedure Laterality Date   ABDOMINAL HYSTERECTOMY  01/1993   partial with AP repair, secondary to fibroids   BREAST SURGERY  1991   breast biopsy   CYSTOCELE REPAIR  10/2007   with graft   MELANOMA EXCISION Right 01/2012   excision right posterior arm   NASAL SINUS SURGERY  07/1991   VAGINAL DELIVERY     x3   Social History   Social History Narrative   Not on file   Immunization History  Administered Date(s) Administered   DTaP 04/06/2013   Influenza Split 10/22/2010   Influenza Whole 10/10/2009, 10/20/2011   Influenza, High Dose Seasonal PF 10/14/2016   Influenza, Seasonal, Injecte, Preservative Fre 10/06/2012   Influenza,inj,Quad PF,6+ Mos 10/30/2014   Influenza,inj,quad, With Preservative 10/12/2017   Influenza-Unspecified 10/26/2013, 10/22/2015   Pneumococcal Conjugate-13  02/13/2014   Pneumococcal Polysaccharide-23 06/12/2010, 03/28/2015   Td 02/07/2003, 03/23/2013   Tdap 03/23/2013   Zoster 05/08/2014     Objective: Vital Signs: BP 129/64 (BP Location: Left Arm, Patient Position: Sitting, Cuff Size: Normal)    Pulse 64    Resp 13    Ht 5\' 7"  (1.702 m)    Wt 175 lb 9.6 oz (79.7 kg)    LMP 01/06/1993    BMI 27.50 kg/m    Physical Exam Vitals signs and nursing note reviewed.  Constitutional:      Appearance: She is well-developed.  HENT:     Head: Normocephalic and atraumatic.  Eyes:     Conjunctiva/sclera: Conjunctivae normal.  Neck:     Musculoskeletal: Normal range of motion.  Cardiovascular:     Rate and Rhythm: Normal rate and regular rhythm.     Heart sounds: Normal heart sounds.  Pulmonary:     Effort: Pulmonary effort is normal.     Breath sounds: Normal breath sounds.     Comments: No crackles auscultated  Abdominal:     General: Bowel sounds are normal.     Palpations: Abdomen is  soft.  Lymphadenopathy:     Cervical: No cervical adenopathy.  Skin:    General: Skin is warm and dry.     Capillary Refill: Capillary refill takes less than 2 seconds.  Neurological:     Mental Status: She is alert and oriented to person, place, and time.  Psychiatric:        Behavior: Behavior normal.      Musculoskeletal Exam: C-spine, thoracic spine, and lumbar spine good ROM.  Midline spinal tenderness in the lumbar region.  Shoulder joints, elbow joints, wrist joints, MCPs, PIPs, and DIPs good ROM with no synovitis.  Synovial thickening of MCPs. PIP and DIP synovial thickening.  Hip joints, knee joints, ankle joints, MTPs, PIPs, and DIPs good ROM with no synovitis.  Warmth of both knee joints but no effusion.  No tenderness or swelling of ankle joints.  No tenderness over trochanteric bursa bilaterally.   CDAI Exam: CDAI Score: 0.6  Patient Global: 3 mm; Provider Global: 3 mm Swollen: 0 ; Tender: 0  Joint Exam   No joint exam has been  documented for this visit   There is currently no information documented on the homunculus. Go to the Rheumatology activity and complete the homunculus joint exam.  Investigation: No additional findings.  Imaging: No results found.  Recent Labs: Lab Results  Component Value Date   WBC 6.1 06/09/2018   HGB 15.5 (H) 06/09/2018   PLT 254.0 06/09/2018   NA 140 06/09/2018   K 4.3 06/09/2018   CL 105 06/09/2018   CO2 29 06/09/2018   GLUCOSE 90 06/09/2018   BUN 13 06/09/2018   CREATININE 0.72 06/09/2018   BILITOT 0.6 06/09/2018   ALKPHOS 67 06/09/2018   AST 21 06/09/2018   ALT 21 06/09/2018   PROT 6.1 06/09/2018   ALBUMIN 3.7 06/09/2018   CALCIUM 9.1 06/09/2018   GFRAA 102 03/26/2018   QFTBGOLDPLUS NEGATIVE 03/26/2018    Speciality Comments: Prior therapy: Plaquenil (allergy), Humira and Enbrel (inadequate response)  Procedures:  No procedures performed Allergies: Adalimumab, Diclofenac, Dicyclomine, Doxycycline hyclate, Enbrel [etanercept], Fosamax [alendronate sodium], Methotrexate derivatives, Nickel, Piroxicam, Salagen [pilocarpine hcl], Sulfamethoxazole-trimethoprim, Tramadol hcl, and Plaquenil [hydroxychloroquine]     Assessment / Plan:     Visit Diagnoses: Rheumatoid arthritis involving multiple sites with positive rheumatoid factor (HCC) - +RF, +CCP, ANA-,severe erosive: She has no synovitis on exam.  She has not had any recent rheumatoid arthritis flares.  She has no real thickening of MCP joints but no tenderness or synovitis noted on exam.  She has intermittent pain in bilateral hands, bilateral knee joints, and bilateral feet.  She is clinically doing well on Orencia 125 mg subcutaneous injections once weekly, methotrexate 0.8 mL subcutaneous injections once weekly, and folic acid 2 mg by mouth daily.  She has been holding methotrexate and Orencia for 3 weeks due to recently having a family reunion and being concerned about COVID-19.  She is planning on resuming her  medications this week.  She did not develop any signs or symptoms of a flare while off of her medications.  We discussed anytime she develop signs or symptoms of infection she is to hold Orencia and methotrexate until the infection has come completely cleared and then she can resume.  She will continue on this current treatment regimen.  Refills of Orencia and methotrexate was sent to the pharmacy today.  She is advised to notify us if labs increase joint pain or joint swelling.  She will follow-up in the office in  5 months.- Plan: methotrexate 50 MG/2ML injection, ORENCIA 125 MG/ML SOSY, Ambulatory referral to Pulmonology  High risk medication use - Orencia 125 mg every 7 days, methotrexate 0.8 mL's every 7 days, and folic acid 1 mg 2 tablets daily.  Last TB gold negative on 03/26/2018 and will monitor yearly.  Most recent CBC/CMP within normal limits except for elevated hemoglobin on 07/06/2018.  Due for CBC/CMP end of September and will monitor every 3 months.  Standing orders are in place.  She received the flu vaccine in October and previously Prevnar 13, Pneumovax 23, and Zostavax.   - Plan: COMPLETE METABOLIC PANEL WITH GFR, CBC with Differential/Platelet  Sicca syndrome ([place]) - She has chronic sicca symptoms.   Primary osteoarthritis of both hands - She has PIP and DIP synovial thickening consistent with osteoarthritis of bilateral hands.  She has complete fist motion bilaterally.  She has been having increased pain and stiffness in both hands but no joint swelling noted.  Joint protection and muscle strengthening were discussed.  Primary osteoarthritis of both knees - She has warmth of bilateral knee joints but no effusion noted.  She has discomfort when going up and down steps but denies any nocturnal pain.  She is no mechanical symptoms at this time.  She is not ready to proceed with Euflexxa injections at this time.  She will notify us if she develops increased joint pain or joint  swelling.  Spondylosis of lumbar region without myelopathy or radiculopathy - Chronic pain.  Her pain is significantly improved since going to physical therapy.  The right-sided radiculopathy has resolved.  She has been walking 20 to 40 minutes daily.  DDD (degenerative disc disease), lumbar - Chronic pain  Age-related osteoporosis without current pathological fracture - She received her third Reclast infusion on 07/08/2018.  She is taking a calcium and vitamin D supplement daily. Last DEXA on 02/04/2018 showed T score of -3.1 and BMD 0.506 at left hip and a -13% change in BMD since January 2018.  She would like to have a repeat DEXA in January 2021.   Shortness of breath - She has been experiencing dyspnea on exertion for the past 9 months.  Her symptoms have progressively been getting worse over the past several months.  She has been walking 20 to 40 minutes daily [place] does not seem to be related to deconditioning.  She had a high-resolution chest CT on 09/20/2013 which did not show any evidence of interstitial lung disease at that time.  She was evaluated by Dr. [name] in the past.  We will place a referral for her to see Dr. [name].  No crackles were auscultated on exam today.  She is advised to notify us if shows any new or worsening symptoms.  Plan: Ambulatory referral to Pulmonology  Other medical conditions are listed as follows:   History of sleep apnea   History of hyperlipidemia   History of gastroesophageal reflux (GERD)   Abnormal SPEP - evaluated by Dr. Irene Limbo.   History of rosacea     Orders: Orders Placed This Encounter  Procedures   COMPLETE METABOLIC PANEL WITH GFR   CBC with Differential/Platelet   Ambulatory referral to Pulmonology   Meds ordered this encounter  Medications   methotrexate 50 MG/2ML injection    Sig: Inject 0.8 mLs (20 mg total) into the skin once a week.    Dispense:  10 mL    Refill:  0   zolpidem (AMBIEN) 10 MG tablet  Sig: Take 0.5  tablets (5 mg total) by mouth at bedtime as needed for sleep.    Dispense:  30 tablet    Refill:  0   ORENCIA 125 MG/ML SOSY    Sig: INJECT THE CONTENTS OF 1 SYRINGE UNDER THE SKIN EVERY WEEK    Dispense:  4 mL    Refill:  2    Face-to-face time spent with patient was 30 minutes. Greater than 50% of time was spent in counseling and coordination of care.  Follow-Up Instructions: Return in about 5 months (around 12/13/2018) for Rheumatoid arthritis, Osteoarthritis.   Ofilia Neas, PA-C  Note - This record has been created using Dragon software.  Chart creation errors have been sought, but may not always  have been located. Such creation errors do not reflect on  the standard of medical care.

## 2018-06-30 NOTE — Telephone Encounter (Signed)
Findings of benefits investigation for Reclast :  Insurance: Medicare and [place] are ACTIVE  Medicare covers 80% of the infusion and no authorization is required, and the patient's T[employer]are is secondary and would cover the 20% of the cost that was not paid for by Medicare as long as Medicare covered the medication. Tricare also covers the patient's Medicare deductible.  Verified information on Tricare's website.  11:42 AM Beatriz Chancellor, CPhT

## 2018-06-30 NOTE — Telephone Encounter (Signed)
Reclast Infusion order placed.   Attempted to contact the patient and left message for patient to call the office.

## 2018-06-30 NOTE — Telephone Encounter (Signed)
Please run benefits investigation for reclast. Thanks!

## 2018-06-30 NOTE — Telephone Encounter (Signed)
Patient advised orders have been placed and has been provided with number to schedule infusion.

## 2018-07-06 ENCOUNTER — Ambulatory Visit: Payer: Self-pay | Admitting: Physician Assistant

## 2018-07-08 ENCOUNTER — Ambulatory Visit (HOSPITAL_COMMUNITY)
Admission: RE | Admit: 2018-07-08 | Discharge: 2018-07-08 | Disposition: A | Discharge status: Home / Self Care | Payer: Medicare Other | Source: Ambulatory Visit | Attending: Rheumatology | Admitting: Rheumatology

## 2018-07-08 ENCOUNTER — Other Ambulatory Visit: Payer: Self-pay

## 2018-07-08 DIAGNOSIS — M810 Age-related osteoporosis without current pathological fracture: Secondary | ICD-10-CM | POA: Insufficient documentation

## 2018-07-08 MED ORDER — ACETAMINOPHEN 325 MG PO TABS: 650.0000 mg | ORAL_TABLET | ORAL | Status: DC

## 2018-07-08 MED ORDER — ZOLEDRONIC ACID 5 MG/100ML IV SOLN
INTRAVENOUS | Status: AC
  Administered 2018-07-08: 5 mg
  Filled 2018-07-08: qty 100

## 2018-07-08 MED ORDER — DIPHENHYDRAMINE HCL 25 MG PO CAPS: 25.0000 mg | ORAL_CAPSULE | ORAL | Status: DC

## 2018-07-08 MED ORDER — ZOLEDRONIC ACID 5 MG/100ML IV SOLN: 5.0000 mg | Freq: Once | INTRAVENOUS | Status: DC

## 2018-07-13 ENCOUNTER — Other Ambulatory Visit: Payer: Self-pay

## 2018-07-13 ENCOUNTER — Encounter: Payer: Self-pay | Admitting: Physician Assistant

## 2018-07-13 ENCOUNTER — Ambulatory Visit (INDEPENDENT_AMBULATORY_CARE_PROVIDER_SITE_OTHER): Payer: Medicare Other | Admitting: Physician Assistant

## 2018-07-13 VITALS — BP 129/64 | HR 64 | Resp 13 | Ht 67.0 in | Wt 175.6 lb

## 2018-07-13 DIAGNOSIS — Z8669 Personal history of other diseases of the nervous system and sense organs: Secondary | ICD-10-CM | POA: Diagnosis not present

## 2018-07-13 DIAGNOSIS — M810 Age-related osteoporosis without current pathological fracture: Secondary | ICD-10-CM

## 2018-07-13 DIAGNOSIS — R0602 Shortness of breath: Secondary | ICD-10-CM

## 2018-07-13 DIAGNOSIS — R778 Other specified abnormalities of plasma proteins: Secondary | ICD-10-CM | POA: Diagnosis not present

## 2018-07-13 DIAGNOSIS — Z8719 Personal history of other diseases of the digestive system: Secondary | ICD-10-CM | POA: Diagnosis not present

## 2018-07-13 DIAGNOSIS — M170 Bilateral primary osteoarthritis of knee: Secondary | ICD-10-CM | POA: Diagnosis not present

## 2018-07-13 DIAGNOSIS — M5136 Other intervertebral disc degeneration, lumbar region: Secondary | ICD-10-CM | POA: Diagnosis not present

## 2018-07-13 DIAGNOSIS — Z872 Personal history of diseases of the skin and subcutaneous tissue: Secondary | ICD-10-CM

## 2018-07-13 DIAGNOSIS — M3500 Sicca syndrome, unspecified: Secondary | ICD-10-CM

## 2018-07-13 DIAGNOSIS — Z79899 Other long term (current) drug therapy: Secondary | ICD-10-CM

## 2018-07-13 DIAGNOSIS — M0579 Rheumatoid arthritis with rheumatoid factor of multiple sites without organ or systems involvement: Secondary | ICD-10-CM

## 2018-07-13 DIAGNOSIS — Z8639 Personal history of other endocrine, nutritional and metabolic disease: Secondary | ICD-10-CM | POA: Diagnosis not present

## 2018-07-13 DIAGNOSIS — M19041 Primary osteoarthritis, right hand: Secondary | ICD-10-CM | POA: Diagnosis not present

## 2018-07-13 DIAGNOSIS — M47816 Spondylosis without myelopathy or radiculopathy, lumbar region: Secondary | ICD-10-CM

## 2018-07-13 DIAGNOSIS — M19042 Primary osteoarthritis, left hand: Secondary | ICD-10-CM

## 2018-07-13 MED ORDER — METHOTREXATE SODIUM CHEMO INJECTION 50 MG/2ML: 20.0000 mg | INTRAMUSCULAR | 0 refills | Status: DC

## 2018-07-13 MED ORDER — ZOLPIDEM TARTRATE 10 MG PO TABS: 5.0000 mg | ORAL_TABLET | Freq: Every evening | ORAL | 0 refills | Status: DC | PRN

## 2018-07-13 MED ORDER — ORENCIA 125 MG/ML ~~LOC~~ SOSY: PREFILLED_SYRINGE | SUBCUTANEOUS | 2 refills | Status: DC

## 2018-07-13 NOTE — Patient Instructions (Signed)
Standing Labs We placed an order today for your standing lab work.    Please come back and get your standing labs in September and every 3 months  We have open lab daily Monday through Thursday from 8:30-12:30 PM and 1:30-4:30 PM and Friday from 8:30-12:30 PM and 1:30 -4:00 PM at the office of Dr. Shaili Deveshwar.   You may experience shorter wait times on Monday and Friday afternoons. The office is located at [address], [address], [place], [place] [address] No appointment is necessary.   Labs are drawn by [facility].  You may receive a bill from [facility] for your lab work.  If you wish to have your labs drawn at another location, please call the office 24 hours in advance to send orders.  If you have any questions regarding directions or hours of operation,  please call [phone].   Just as a reminder please drink plenty of water prior to coming for your lab work. Thanks!  

## 2018-07-13 NOTE — Addendum Note (Signed)
Addended by: Earnestine Mealing on: 07/13/2018 05:02 PM   Modules accepted: Orders

## 2018-07-26 ENCOUNTER — Telehealth: Payer: Self-pay | Admitting: Rheumatology

## 2018-07-26 DIAGNOSIS — R0602 Shortness of breath: Secondary | ICD-10-CM

## 2018-07-26 NOTE — Telephone Encounter (Signed)
Order placed for high res chest CT. Attempted to contact patient and left message on machine to advise her that order has been placed.

## 2018-07-26 NOTE — Telephone Encounter (Signed)
Patient states she has not called Dr. Golden Pop office yet regarding an appointment. I advised patient that referral was faxed last week, patient verbalized understanding and will call their office to schedule an appointment.

## 2018-07-26 NOTE — Telephone Encounter (Signed)
Okay to order high res chest CT?

## 2018-07-26 NOTE — Telephone Encounter (Signed)
Patient left a voicemail stating she called Dr. Golden Pop office and was told "he is not scheduling any appointments right now due to [place]."  Patient states she was told "he rarely comes in the office."  Patient states Dr. Estanislado Pandy also mentioned "referring her for a scan and wants to pursue that instead."  Please advise.

## 2018-07-26 NOTE — Telephone Encounter (Signed)
Ok to place order for high resolution chest CT.

## 2018-07-26 NOTE — Telephone Encounter (Signed)
Patient called stating Dr. Estanislado Pandy referred her to Dr. [name].  Patient states there are no appointments available "in the near future" and is requesting a return call.

## 2018-08-04 ENCOUNTER — Encounter (HOSPITAL_COMMUNITY): Payer: Self-pay

## 2018-08-04 ENCOUNTER — Other Ambulatory Visit: Payer: Self-pay

## 2018-08-04 ENCOUNTER — Ambulatory Visit (HOSPITAL_COMMUNITY)
Admission: RE | Admit: 2018-08-04 | Discharge: 2018-08-04 | Disposition: A | Discharge status: Home / Self Care | Payer: Medicare Other | Source: Ambulatory Visit | Attending: Physician Assistant | Admitting: Physician Assistant

## 2018-08-04 DIAGNOSIS — I700 Atherosclerosis of aorta: Secondary | ICD-10-CM | POA: Diagnosis not present

## 2018-08-04 DIAGNOSIS — R0602 Shortness of breath: Secondary | ICD-10-CM | POA: Insufficient documentation

## 2018-08-04 DIAGNOSIS — J984 Other disorders of lung: Secondary | ICD-10-CM | POA: Diagnosis not present

## 2018-08-04 NOTE — Progress Notes (Signed)
Chest CT revealed very mild areas of ground-glass attenuation in the lung bases bilaterally.  The diagnosis of ILD cannot be excluded at this time.  Please notify patient and check on her referral to pulmonology

## 2018-08-06 ENCOUNTER — Telehealth: Payer: Self-pay | Admitting: Rheumatology

## 2018-08-06 NOTE — Telephone Encounter (Signed)
Patient left a voicemail requesting a return call regarding her CT scan results.

## 2018-08-06 NOTE — Progress Notes (Signed)
She should be able to see any pulmonologist.

## 2018-08-06 NOTE — Telephone Encounter (Signed)
Patient advised Chest CT revealed very mild areas of ground-glass attenuation in the lung bases bilaterally. The diagnosis of ILD cannot be excluded at this time. Patient states Dr. [name] is not making appointments at this time. Patient wants to know if you still want her to see someone in his office or another pulmonary office.

## 2018-08-24 DIAGNOSIS — D225 Melanocytic nevi of trunk: Secondary | ICD-10-CM | POA: Diagnosis not present

## 2018-08-24 DIAGNOSIS — D1801 Hemangioma of skin and subcutaneous tissue: Secondary | ICD-10-CM | POA: Diagnosis not present

## 2018-08-24 DIAGNOSIS — Z85820 Personal history of malignant melanoma of skin: Secondary | ICD-10-CM | POA: Diagnosis not present

## 2018-08-24 DIAGNOSIS — L812 Freckles: Secondary | ICD-10-CM | POA: Diagnosis not present

## 2018-08-24 DIAGNOSIS — L821 Other seborrheic keratosis: Secondary | ICD-10-CM | POA: Diagnosis not present

## 2018-09-01 ENCOUNTER — Ambulatory Visit (INDEPENDENT_AMBULATORY_CARE_PROVIDER_SITE_OTHER): Payer: Medicare Other | Admitting: Pulmonary Disease

## 2018-09-01 ENCOUNTER — Other Ambulatory Visit: Payer: Self-pay

## 2018-09-01 ENCOUNTER — Encounter: Payer: Self-pay | Admitting: Pulmonary Disease

## 2018-09-01 VITALS — BP 110/62 | HR 57 | Temp 98.7°F | Ht 67.0 in | Wt 174.6 lb

## 2018-09-01 DIAGNOSIS — I2510 Atherosclerotic heart disease of native coronary artery without angina pectoris: Secondary | ICD-10-CM | POA: Diagnosis not present

## 2018-09-01 DIAGNOSIS — J4530 Mild persistent asthma, uncomplicated: Secondary | ICD-10-CM

## 2018-09-01 MED ORDER — BUDESONIDE-FORMOTEROL FUMARATE 160-4.5 MCG/ACT IN AERO: 2.0000 | INHALATION_SPRAY | Freq: Two times a day (BID) | RESPIRATORY_TRACT | 0 refills | Status: DC

## 2018-09-01 MED ORDER — BUDESONIDE-FORMOTEROL FUMARATE 160-4.5 MCG/ACT IN AERO: 2.0000 | INHALATION_SPRAY | Freq: Two times a day (BID) | RESPIRATORY_TRACT | 6 refills | Status: DC

## 2018-09-01 NOTE — Progress Notes (Addendum)
Deanna Norris    [id number]    10-22-1945  Primary Care Physician:Tower, [name], MD  Referring Physician: Ofilia Neas, PA-C [address] [place],   [address]  Chief complaint: Consult for dyspnea  HPI: 73 year old with history of sarcoidosis, rheumatoid arthritis, sleep apnea Complains of dyspnea on exertion for the past few months.  She has mild symptoms at rest.  Denies any cough, sputum production, fevers, chills Previously evaluated by Dr. [name] in 2015 with high-resolution CT with no evidence of interstitial lung disease. She also had a cardiology evaluation at that time for coronary atherosclerosis with negative stress test  Has history of seropositive rheumatoid arthritis, Sjogren's syndrome.  Maintained on Orencia and methotrexate.  Followed with Dr. Estanislado Pandy. Has sleep apnea for which she is on CPAP for the past 15 years with no issues.  Sees Dr. Maxwell Caul for OSA.  Pets: No pets Occupation: Homemaker Exposures: No known exposures.  No mold, hot tub, Jacuzzi Smoking history: Never smoker Travel history: Generally from [place].  Has been in [place] since 1990 Relevant family history: No significant family history of lung disease  Outpatient Encounter Medications as of 09/01/2018  Medication Sig  . acetaminophen (TYLENOL) 500 MG tablet Take 500 mg by mouth every 8 (eight) hours as needed.  Marland Kitchen albuterol (PROAIR HFA) 108 (90 BASE) MCG/ACT inhaler Inhale 2 puffs into the lungs every 4 (four) hours as needed.  Marland Kitchen aspirin 81 MG tablet Take 81 mg by mouth daily.   . Calcium Carbonate Antacid (TUMS PO) Take by mouth as needed.  . Calcium Carbonate-Vitamin D (CALCIUM 500/D PO) Take 1 tablet by mouth 2 (two) times daily.  . Cholecalciferol (VITAMIN D3) 2000 UNITS TABS Take 1 tablet by mouth daily.    Marland Kitchen desonide (DESOWEN) 0.05 % lotion Apply topically as needed.  . diclofenac sodium (VOLTAREN) 1 % GEL Apply 1 application topically at bedtime  as needed.   Mariane Baumgarten Calcium (STOOL SOFTENER PO) Take 3 capsules by mouth every morning.   . Estradiol (YUVAFEM) 10 MCG TABS vaginal tablet INSERT 1 TABLET VAGINALLY TWICE A WEEK  . famotidine (PEPCID) 40 MG tablet Take 1 tablet (40 mg total) by mouth 2 (two) times daily.  . fluticasone (FLONASE) 50 MCG/ACT nasal spray USE 2 SPRAYS IN EACH NOSTRIL DAILY AS NEEDED FOR RHINITIS OR ALLERGIES  . folic acid (FOLVITE) 1 MG tablet TAKE 2 TABLETS DAILY  . hydrocortisone 2.5 % ointment Apply 1 application topically as needed.   . Ibuprofen 200 MG CAPS Take 3 capsules by mouth as needed.  . Magnesium Malate 1250 (141.7 Mg) MG TABS Take 3 tablets by mouth daily.   . methotrexate 50 MG/2ML injection Inject 0.8 mLs (20 mg total) into the skin once a week.  . Multiple Vitamin (MULTI-VITAMINS) TABS Take 1 tablet by mouth daily.  . Omega-3 1000 MG CAPS Take 1 tablet by mouth daily.  Marland Kitchen ORENCIA 125 MG/ML SOSY INJECT THE CONTENTS OF 1 SYRINGE UNDER THE SKIN EVERY WEEK  . Tuberculin-Allergy Syringes 27G X 1/2" 1 ML KIT Inject 1 Syringe into the skin once a week. To be used weekly with methotrexate.  . TURMERIC CURCUMIN PO Take by mouth daily.  . zoledronic acid (RECLAST) 5 MG/100ML SOLN injection Inject 5 mg into the vein once. Takes yearly  . zolpidem (AMBIEN) 10 MG tablet Take 0.5 tablets (5 mg total) by mouth at bedtime as needed for sleep.  . [DISCONTINUED] linaclotide (  LINZESS) 72 MCG capsule Take 1 capsule (72 mcg total) by mouth daily as needed.   No facility-administered encounter medications on file as of 09/01/2018.     Allergies as of 09/01/2018 - Review Complete 09/01/2018  Allergen Reaction Noted  . Adalimumab    . Diclofenac  07/30/2012  . Dicyclomine  02/13/2014  . Doxycycline hyclate Other (See Comments) 12/22/2013  . Enbrel [etanercept]  08/11/2017  . Fosamax [alendronate sodium]  07/30/2012  . Methotrexate derivatives Nausea Only 02/09/2012  . Nickel  02/13/2014  . Piroxicam   07/30/2012  . Salagen [pilocarpine hcl] Other (See Comments) 01/02/2015  . Sulfamethoxazole-trimethoprim    . Tramadol hcl    . Plaquenil [hydroxychloroquine] Rash 02/13/2014    Past Medical History:  Diagnosis Date  . Asthma   . Fibroid   . GERD (gastroesophageal reflux disease)   . Insomnia   . Menopausal symptoms   . Osteoporosis   . Postmenopausal HRT (hormone replacement therapy)   . RA (rheumatoid arthritis) ([place]) 11/2004   methotrexate and Embrel  . Sjogren's disease ([place])   . Sleep apnea 2008   C-Pap  . Vitamin D deficiency     Past Surgical History:  Procedure Laterality Date  . ABDOMINAL HYSTERECTOMY  01/1993   partial with AP repair, secondary to fibroids  . BREAST SURGERY  1991   breast biopsy  . CYSTOCELE REPAIR  10/2007   with graft  . MELANOMA EXCISION Right 01/2012   excision right posterior arm  . NASAL SINUS SURGERY  07/1991  . VAGINAL DELIVERY     x3    Family History  Problem Relation Age of Onset  . Breast cancer Sister 44  . Thyroid disease Sister   . Cancer Sister 33       Leukemia--CLL  . Heart disease Brother     Social History   Socioeconomic History  . Marital status: Married    Spouse name: Not on file  . Number of children: Not on file  . Years of education: Not on file  . Highest education level: Not on file  Occupational History  . Occupation: housewife    Employer: Not Employed  Social Needs  . Financial resource strain: Not on file  . Food insecurity    Worry: Not on file    Inability: Not on file  . Transportation needs    Medical: Not on file    Non-medical: Not on file  Tobacco Use  . Smoking status: Never Smoker  . Smokeless tobacco: Never Used  Substance and Sexual Activity  . Alcohol use: No    Alcohol/week: 0.0 standard drinks  . Drug use: No  . Sexual activity: Yes    Partners: Male    Birth control/protection: Surgical    Comment: TAH  Lifestyle  . Physical activity    Days per week: Not on file     Minutes per session: Not on file  . Stress: Not on file  Relationships  . Social Herbalist on phone: Not on file    Gets together: Not on file    Attends religious service: Not on file    Active member of club or organization: Not on file    Attends meetings of clubs or organizations: Not on file    Relationship status: Not on file  . Intimate partner violence    Fear of current or ex partner: Not on file    Emotionally abused: Not on file  Physically abused: Not on file    Forced sexual activity: Not on file  Other Topics Concern  . Not on file  Social History Narrative  . Not on file    Review of systems: Review of Systems  Constitutional: Negative for fever and chills.  HENT: Negative.   Eyes: Negative for blurred vision.  Respiratory: as per HPI  Cardiovascular: Negative for chest pain and palpitations.  Gastrointestinal: Negative for vomiting, diarrhea, blood per rectum. Genitourinary: Negative for dysuria, urgency, frequency and hematuria.  Musculoskeletal: Negative for myalgias, back pain and joint pain.  Skin: Negative for itching and rash.  Neurological: Negative for dizziness, tremors, focal weakness, seizures and loss of consciousness.  Endo/Heme/Allergies: Negative for environmental allergies.  Psychiatric/Behavioral: Negative for depression, suicidal ideas and hallucinations.  All other systems reviewed and are negative.  Physical Exam: Blood pressure 110/62, pulse (!) 57, temperature 98.7 F (37.1 C), temperature source Temporal, height 5' 7"  (1.702 m), weight 174 lb 9.6 oz (79.2 kg), last menstrual period 01/06/1993, SpO2 98 %. Gen:      No acute distress HEENT:  EOMI, sclera anicteric Neck:     No masses; no thyromegaly Lungs:    Clear to auscultation bilaterally; normal respiratory effort CV:         Regular rate and rhythm; no murmurs Abd:      + bowel sounds; soft, non-tender; no palpable masses, no distension Ext:    No edema; adequate  peripheral perfusion Skin:      Warm and dry; no rash Neuro: alert and oriented x 3 Psych: normal mood and affect  Data Reviewed: Imaging: CT high-resolution 09/20/2013- no evidence of interstitial lung disease.  Moderate air trapping.  Left anterior descending coronary atherosclerosis CT high-resolution 09/01/2018- minimal groundglass opacities.  Moderate air trapping.  Left anterior descending and right circumflex coronary atherosclerosis, aortic valve calcifications I have reviewed the images personally  PFTs: 09/01/2013 FVC 2.86 [86%], FEV1 2.29 [90%], F/F 80, TLC 5.47 [102%], DLCO 20.14 [94%] Minimal obstructive airway disease with bronchodilator response.  Small airways disease Minimal diffusion defect  Labs: CBC 06/09/2018-WBC 6.1, eos 7.4%, absolute eosinophil count 451  Cardiac: Cardiopulmonary exercise test 10/07/2013- normal exercise capacity.  No ventilatory limitation.  Negative for exercise-induced bronchospasm.  There were findings suggestive of mild circulatory limitation.  Cardiac stress test 10/19/2013-negative for ischemia  Echocardiogram 10/05/2012- LVEF 60-65%.  Mild TR, mild MR.  Assessment:  Mild asthma Patient has minimal obstruction with small airways disease on PFT and air trapping suggestive of mild asthma. Also noted to have elevated peripheral eosinophils We will give her a sample of Symbicort inhaler.  If it works for her then we can call in a prescription  Concern for interstitial lung disease Has history of rheumatoid arthritis, Sjogren's on Orencia, methotrexate Recent high-resolution CT reviewed with minimal groundglass opacities.  There is no clear evidence of interstitial lung disease.  We will continue to monitor with repeat high-res CT in 1 year.  Coronary atherosclerosis Coronary, aortic atherosclerosis noted.  She was evaluated in 2015 for this but the degree of atherosclerosis is increased.  Will refer to cardiology for reevaluation.   Plan/Recommendations: - Symbicort inhaler samples - Follow-up high-resolution CT in 1 year - Referral to cardiology.  Marshell Garfinkel MD  Pulmonary and Critical Care 09/01/2018, 3:20 PM  CC: Ofilia Neas, PA-C

## 2018-09-01 NOTE — Patient Instructions (Addendum)
Your CT scan shows no clear evidence of interstitial lung disease which is good news We will follow-up with a high-resolution CT in 1 year  There is evidence of mild asthma.  We will give you a sample of Symbicort 160 inhaler.  Use 2 puffs twice daily If it makes an improvement in your breathing then call us for a prescription.  For the findings of atherosclerosis and aortic calcification we will refer you to a heart doctor for further evaluation.  Follow-up in 1 to 2 months.

## 2018-09-03 ENCOUNTER — Telehealth: Payer: Self-pay | Admitting: Pulmonary Disease

## 2018-09-03 NOTE — Telephone Encounter (Signed)
Medication name and strength: Symbicort 160-4.26mcg  Provider: Dr. Vaughan Browner  Pharmacy: [facility] Patient insurance [id number]    Was the PA started on CMM?  Yes If yes, please enter the Key: [id number]   PA has been approved starting today until 01/05/2098.   Pharmacy is aware of the approval. Nothing further needed at time of call.

## 2018-09-27 ENCOUNTER — Other Ambulatory Visit: Payer: Self-pay | Admitting: *Deleted

## 2018-09-27 MED ORDER — ZOLPIDEM TARTRATE 10 MG PO TABS: 5.0000 mg | ORAL_TABLET | Freq: Every evening | ORAL | 0 refills | Status: DC | PRN

## 2018-09-27 NOTE — Telephone Encounter (Signed)
Last Visit: 07/13/18 Next Visit: 12/14/18  Okay to refill Ambien?

## 2018-09-28 ENCOUNTER — Other Ambulatory Visit: Payer: Self-pay

## 2018-09-28 DIAGNOSIS — Z79899 Other long term (current) drug therapy: Secondary | ICD-10-CM

## 2018-09-29 LAB — CBC WITH DIFFERENTIAL/PLATELET
Absolute Monocytes: 984 cells/uL — ABNORMAL HIGH (ref 200–950)
Basophils Absolute: 107 cells/uL (ref 0–200)
Basophils Relative: 1.3 %
Eosinophils Absolute: 484 cells/uL (ref 15–500)
Eosinophils Relative: 5.9 %
HCT: 46 % — ABNORMAL HIGH (ref 35.0–45.0)
Hemoglobin: 15.4 g/dL (ref 11.7–15.5)
Lymphs Abs: 2034 cells/uL (ref 850–3900)
MCH: 32.6 pg (ref 27.0–33.0)
MCHC: 33.5 g/dL (ref 32.0–36.0)
MCV: 97.3 fL (ref 80.0–100.0)
MPV: 11.1 fL (ref 7.5–12.5)
Monocytes Relative: 12 %
Neutro Abs: 4592 cells/uL (ref 1500–7800)
Neutrophils Relative %: 56 %
Platelets: 242 10*3/uL (ref 140–400)
RBC: 4.73 10*6/uL (ref 3.80–5.10)
RDW: 13.8 % (ref 11.0–15.0)
Total Lymphocyte: 24.8 %
WBC: 8.2 10*3/uL (ref 3.8–10.8)

## 2018-09-29 LAB — COMPLETE METABOLIC PANEL WITH GFR
AG Ratio: 1.8 (calc) (ref 1.0–2.5)
ALT: 20 U/L (ref 6–29)
AST: 20 U/L (ref 10–35)
Albumin: 3.9 g/dL (ref 3.6–5.1)
Alkaline phosphatase (APISO): 60 U/L (ref 37–153)
BUN: 14 mg/dL (ref 7–25)
CO2: 25 mmol/L (ref 20–32)
Calcium: 9.7 mg/dL (ref 8.6–10.4)
Chloride: 106 mmol/L (ref 98–110)
Creat: 0.68 mg/dL (ref 0.60–0.93)
GFR, Est African American: 101 mL/min/{1.73_m2} (ref >=60)
GFR, Est Non African American: 87 mL/min/{1.73_m2} (ref >=60)
Globulin: 2.2 g/dL (calc) (ref 1.9–3.7)
Glucose, Bld: 82 mg/dL (ref 65–99)
Potassium: 4.9 mmol/L (ref 3.5–5.3)
Sodium: 140 mmol/L (ref 135–146)
Total Bilirubin: 0.5 mg/dL (ref 0.2–1.2)
Total Protein: 6.1 g/dL (ref 6.1–8.1)

## 2018-09-29 NOTE — Progress Notes (Signed)
CBC stable. CMP WNL.

## 2018-10-05 DIAGNOSIS — H43393 Other vitreous opacities, bilateral: Secondary | ICD-10-CM | POA: Diagnosis not present

## 2018-10-05 DIAGNOSIS — H524 Presbyopia: Secondary | ICD-10-CM | POA: Diagnosis not present

## 2018-10-05 DIAGNOSIS — H5203 Hypermetropia, bilateral: Secondary | ICD-10-CM | POA: Diagnosis not present

## 2018-10-05 DIAGNOSIS — Z961 Presence of intraocular lens: Secondary | ICD-10-CM | POA: Diagnosis not present

## 2018-10-05 DIAGNOSIS — H43813 Vitreous degeneration, bilateral: Secondary | ICD-10-CM | POA: Diagnosis not present

## 2018-10-05 DIAGNOSIS — M3501 Sicca syndrome with keratoconjunctivitis: Secondary | ICD-10-CM | POA: Diagnosis not present

## 2018-10-05 DIAGNOSIS — H52223 Regular astigmatism, bilateral: Secondary | ICD-10-CM | POA: Diagnosis not present

## 2018-10-05 DIAGNOSIS — H04123 Dry eye syndrome of bilateral lacrimal glands: Secondary | ICD-10-CM | POA: Diagnosis not present

## 2018-10-13 ENCOUNTER — Telehealth: Payer: Self-pay | Admitting: Pulmonary Disease

## 2018-10-13 NOTE — Telephone Encounter (Signed)
Called and spoke w/ pt. Pt is inquiring about the use of her Symbicort 160 inhaler. Pt was given a sample of Symbicort 160 at her last visit w/ Dr. Vaughan Browner 09/01/2018. She states she took the entire course of the sample; however, did not see a difference in her breathing and still had approx. 2 asthma attacks during the time she took it. She states the attacks were resolved with her rescue inhaler.   Pt also states she has severe osteoporosis and is concerned because on the packaging label it states she should consult her provider about using Symbicort if she has osteoporosis. I let her know I would get a message to Dr. Vaughan Browner to follow-up on tomorrow 10/14/2018, as he will be in the office. Pt expressed understanding with no additional questions.   Dr. Vaughan Browner, please advise. Thank you.

## 2018-10-14 DIAGNOSIS — G4733 Obstructive sleep apnea (adult) (pediatric): Secondary | ICD-10-CM | POA: Diagnosis not present

## 2018-10-14 NOTE — Telephone Encounter (Signed)
She can hold the Symbicort if she has not seen any difference in the breathing  I will discuss further at time of upcoming clinic visit.

## 2018-10-14 NOTE — Telephone Encounter (Signed)
Spoke with patient. She was made aware of Dr. Matilde Bash recommendations. She verbalized understanding. Advised her to call us back if she needed anything before her appt, she verbalized understanding.   Nothing further needed at time of call.

## 2018-10-25 ENCOUNTER — Telehealth: Payer: Self-pay | Admitting: Rheumatology

## 2018-10-25 DIAGNOSIS — M0579 Rheumatoid arthritis with rheumatoid factor of multiple sites without organ or systems involvement: Secondary | ICD-10-CM

## 2018-10-25 MED ORDER — METHOTREXATE SODIUM CHEMO INJECTION 50 MG/2ML: 20.0000 mg | INTRAMUSCULAR | 0 refills | Status: DC

## 2018-10-25 MED ORDER — ORENCIA 125 MG/ML ~~LOC~~ SOSY: PREFILLED_SYRINGE | SUBCUTANEOUS | 2 refills | Status: DC

## 2018-10-25 NOTE — Addendum Note (Signed)
Addended by: Carole Binning on: 10/25/2018 02:54 PM   Modules accepted: Orders

## 2018-10-25 NOTE — Telephone Encounter (Signed)
Last Visit: 07/13/18 Next Visit: 12/14/18 Labs: 09/28/18 CBC stable.CMP WNL TB Gold: 03/26/18 Neg   Okay to refill per Dr. Estanislado Pandy

## 2018-10-25 NOTE — Telephone Encounter (Signed)
Patient called requesting prescription refill of Orencia 28 day supply with 2 refills to be sent to [facility].  Patient also requested prescription refill of Methotrexate to be sent to [facility]

## 2018-10-29 ENCOUNTER — Encounter: Payer: Self-pay | Admitting: Pulmonary Disease

## 2018-10-29 ENCOUNTER — Ambulatory Visit (INDEPENDENT_AMBULATORY_CARE_PROVIDER_SITE_OTHER): Payer: Medicare Other | Admitting: Pulmonary Disease

## 2018-10-29 ENCOUNTER — Other Ambulatory Visit: Payer: Self-pay

## 2018-10-29 VITALS — BP 120/78 | HR 60 | Temp 97.6°F | Ht 67.0 in | Wt 177.6 lb

## 2018-10-29 DIAGNOSIS — J849 Interstitial pulmonary disease, unspecified: Secondary | ICD-10-CM

## 2018-10-29 DIAGNOSIS — I2510 Atherosclerotic heart disease of native coronary artery without angina pectoris: Secondary | ICD-10-CM

## 2018-10-29 MED ORDER — BUDESONIDE-FORMOTEROL FUMARATE 160-4.5 MCG/ACT IN AERO: 2.0000 | INHALATION_SPRAY | Freq: Two times a day (BID) | RESPIRATORY_TRACT | 6 refills | Status: DC

## 2018-10-29 NOTE — Progress Notes (Signed)
Deanna Norris    [id number]    11-Jun-1945  Primary Care Physician:Tower, [name], MD  Referring Physician: Tower, [name], MD [place],  [place] [address]  Chief complaint: Follow up for mild asthma  HPI: 73 year old with history of sarcoidosis, rheumatoid arthritis, sleep apnea Complains of dyspnea on exertion for the past few months.  She has mild symptoms at rest.  Denies any cough, sputum production, fevers, chills Previously evaluated by Dr. [name] in 2015 with high-resolution CT with no evidence of interstitial lung disease. She also had a cardiology evaluation at that time for coronary atherosclerosis with negative stress test  Has history of seropositive rheumatoid arthritis, Sjogren's syndrome.  Maintained on Orencia and methotrexate.  Followed with Dr. Estanislado Pandy. Has sleep apnea for which she is on CPAP for the past 15 years with no issues.  Sees Dr. Maxwell Caul for OSA.  Pets: No pets Occupation: Homemaker Exposures: No known exposures.  No mold, hot tub, Jacuzzi Smoking history: Never smoker Travel history: Generally from [place].  Has been in [place] since 1990 Relevant family history: No significant family history of lung disease  Interim history: Given Symbicort at last visit.  She has not noticed any difference with this and hence stopped it a month ago. She is wary of using inhaled corticosteroids due to side effects of osteoporosis.    Outpatient Encounter Medications as of 10/29/2018  Medication Sig  . acetaminophen (TYLENOL) 500 MG tablet Take 500 mg by mouth every 8 (eight) hours as needed.  Marland Kitchen albuterol (PROAIR HFA) 108 (90 BASE) MCG/ACT inhaler Inhale 2 puffs into the lungs every 4 (four) hours as needed.  Marland Kitchen aspirin 81 MG tablet Take 81 mg by mouth daily.   . budesonide-formoterol (SYMBICORT) 160-4.5 MCG/ACT inhaler Inhale 2 puffs into the lungs 2 (two) times daily.  . budesonide-formoterol (SYMBICORT) 160-4.5 MCG/ACT  inhaler Inhale 2 puffs into the lungs 2 (two) times daily.  . Calcium Carbonate Antacid (TUMS PO) Take by mouth as needed.  . Calcium Carbonate-Vitamin D (CALCIUM 500/D PO) Take 1 tablet by mouth 2 (two) times daily.  . Cholecalciferol (VITAMIN D3) 2000 UNITS TABS Take 1 tablet by mouth daily.    Marland Kitchen desonide (DESOWEN) 0.05 % lotion Apply topically as needed.  . diclofenac sodium (VOLTAREN) 1 % GEL Apply 1 application topically at bedtime as needed.   Mariane Baumgarten Calcium (STOOL SOFTENER PO) Take 3 capsules by mouth every morning.   . Estradiol (YUVAFEM) 10 MCG TABS vaginal tablet INSERT 1 TABLET VAGINALLY TWICE A WEEK  . famotidine (PEPCID) 40 MG tablet Take 1 tablet (40 mg total) by mouth 2 (two) times daily.  . fluticasone (FLONASE) 50 MCG/ACT nasal spray USE 2 SPRAYS IN EACH NOSTRIL DAILY AS NEEDED FOR RHINITIS OR ALLERGIES  . folic acid (FOLVITE) 1 MG tablet TAKE 2 TABLETS DAILY  . hydrocortisone 2.5 % ointment Apply 1 application topically as needed.   . Ibuprofen 200 MG CAPS Take 3 capsules by mouth as needed.  . Magnesium Malate 1250 (141.7 Mg) MG TABS Take 3 tablets by mouth daily.   . methotrexate 50 MG/2ML injection Inject 0.8 mLs (20 mg total) into the skin once a week.  . Multiple Vitamin (MULTI-VITAMINS) TABS Take 1 tablet by mouth daily.  . Omega-3 1000 MG CAPS Take 1 tablet by mouth daily.  Marland Kitchen ORENCIA 125 MG/ML SOSY INJECT THE CONTENTS OF 1 SYRINGE UNDER THE SKIN EVERY WEEK  .  Tuberculin-Allergy Syringes 27G X 1/2" 1 ML KIT Inject 1 Syringe into the skin once a week. To be used weekly with methotrexate.  . TURMERIC CURCUMIN PO Take by mouth daily.  . zoledronic acid (RECLAST) 5 MG/100ML SOLN injection Inject 5 mg into the vein once. Takes yearly  . zolpidem (AMBIEN) 10 MG tablet Take 0.5 tablets (5 mg total) by mouth at bedtime as needed for sleep.   No facility-administered encounter medications on file as of 10/29/2018.    Physical Exam: Blood pressure 120/78, pulse 60,  temperature 97.6 F (36.4 C), temperature source Temporal, height 5' 7"  (1.702 m), weight 177 lb 9.6 oz (80.6 kg), last menstrual period 01/06/1993, SpO2 95 %. Gen:      No acute distress HEENT:  EOMI, sclera anicteric Neck:     No masses; no thyromegaly Lungs:    Clear to auscultation bilaterally; normal respiratory effort CV:         Regular rate and rhythm; no murmurs Abd:      + bowel sounds; soft, non-tender; no palpable masses, no distension Ext:    No edema; adequate peripheral perfusion Skin:      Warm and dry; no rash Neuro: alert and oriented x 3 Psych: normal mood and affect  Data Reviewed: Imaging: CT high-resolution 09/20/2013- no evidence of interstitial lung disease.  Moderate air trapping.  Left anterior descending coronary atherosclerosis CT high-resolution 09/01/2018- minimal groundglass opacities.  Moderate air trapping.  Left anterior descending and right circumflex coronary atherosclerosis, aortic valve calcifications I have reviewed the images personally  PFTs: 09/01/2013 FVC 2.86 [86%], FEV1 2.29 [90%], F/F 80, TLC 5.47 [102%], DLCO 20.14 [94%] Minimal obstructive airway disease with bronchodilator response.  Small airways disease Minimal diffusion defect  Asthma score Act score 10/29/2018-19  Labs: CBC 06/09/2018-WBC 6.1, eos 7.4%, absolute eosinophil count 451  Cardiac: Cardiopulmonary exercise test 10/07/2013- normal exercise capacity.  No ventilatory limitation.  Negative for exercise-induced bronchospasm.  There were findings suggestive of mild circulatory limitation.  Cardiac stress test 10/19/2013-negative for ischemia  Echocardiogram 10/05/2012- LVEF 60-65%.  Mild TR, mild MR.  Assessment:  Mild asthma Patient has minimal obstruction with small airways disease on PFT and air trapping suggestive of mild asthma. Also noted to have elevated peripheral eosinophils  She does not want to be on inhaled corticosteroids long-term.  Since her symptoms are mild  I told her it is okay to use the Symbicort as needed going forward.   Concern for interstitial lung disease Has history of rheumatoid arthritis, Sjogren's on Orencia, methotrexate Recent high-resolution CT reviewed with minimal groundglass opacities.  There is no clear evidence of interstitial lung disease.  We will continue to monitor Get spirometry, diffusion capacity in 6 months  Coronary atherosclerosis Coronary, aortic atherosclerosis noted.  She was evaluated in 2015 for this but the degree of atherosclerosis is increased.  Has follow-up with cardiology for evaluation  Plan/Recommendations: - Symbicort as needed - Spirometry, diffusion capacity in 6 months  Marshell Garfinkel MD [facility] 10/29/2018, 10:31 AM  CC: Tower, [name], MD

## 2018-10-29 NOTE — Patient Instructions (Signed)
It is okay to use the Symbicort inhaler as needed in case of bronchitis and asthma exacerbation We will call in a prescription to renew this  Will order spirometry and diffusion capacity in 6 months time and follow-up in clinic after these.

## 2018-11-23 NOTE — Progress Notes (Signed)
Cardiology Office Note:    Date:  11/24/2018   ID:  Deanna Norris, DOB [birth year], MRN [id number]  PCP:  Abner Greenspan, MD  Cardiologist:  No primary care provider on file.   Referring MD: Marshell Garfinkel, MD   Chief Complaint  Patient presents with  . Coronary Artery Disease  . Shortness of Breath  . Advice Only    Dyspnea on exertion    History of Present Illness:    Deanna Norris is a 73 y.o. female with a hx of CA calcium on CT scan, hyperlipidemia, and OSA who presents for CAD f/u.  Deanna Norris has known coronary artery calcification and had a prior nuclear study that was unremarkable.  The evaluation was done in 2015.  For the past 12 months she has noted progressive dyspnea on exertion.  There is no associated tightness in the chest or other anginal symptom.  She denies orthopnea and PND.  There is no peripheral edema.  She has noted a gradual decline in exertional tolerance despite having regular exercise patterns.  In other words she is losing exertional tolerance despite efforts to maintain.  She has seen pulmonology.  There is some concern about the possibility of interstitial lung disease.  Past Medical History:  Diagnosis Date  . Asthma   . Fibroid   . GERD (gastroesophageal reflux disease)   . Insomnia   . Menopausal symptoms   . Osteoporosis   . Postmenopausal HRT (hormone replacement therapy)   . RA (rheumatoid arthritis) ([place]) 11/2004   methotrexate and Embrel  . Sjogren's disease ([place])   . Sleep apnea 2008   C-Pap  . Vitamin D deficiency     Past Surgical History:  Procedure Laterality Date  . ABDOMINAL HYSTERECTOMY  01/1993   partial with AP repair, secondary to fibroids  . BREAST SURGERY  1991   breast biopsy  . CYSTOCELE REPAIR  10/2007   with graft  . MELANOMA EXCISION Right 01/2012   excision right posterior arm  . NASAL SINUS SURGERY  07/1991  . VAGINAL DELIVERY     x3    Current Medications: Current Meds  Medication Sig  .  acetaminophen (TYLENOL) 500 MG tablet Take 500 mg by mouth every 8 (eight) hours as needed.  Marland Kitchen albuterol (PROAIR HFA) 108 (90 BASE) MCG/ACT inhaler Inhale 2 puffs into the lungs every 4 (four) hours as needed.  Marland Kitchen aspirin 81 MG tablet Take 81 mg by mouth daily.   . Calcium Carbonate-Vitamin D (CALCIUM 500/D PO) Take 1 tablet by mouth 2 (two) times daily.  . Cholecalciferol (VITAMIN D3) 2000 UNITS TABS Take 1 tablet by mouth daily.    Marland Kitchen desonide (DESOWEN) 0.05 % lotion Apply topically as needed.  . diclofenac sodium (VOLTAREN) 1 % GEL Apply 1 application topically at bedtime as needed.   . famotidine (PEPCID) 40 MG tablet Take 1 tablet (40 mg total) by mouth 2 (two) times daily.  . fluticasone (FLONASE) 50 MCG/ACT nasal spray USE 2 SPRAYS IN EACH NOSTRIL DAILY AS NEEDED FOR RHINITIS OR ALLERGIES  . folic acid (FOLVITE) 1 MG tablet TAKE 2 TABLETS DAILY  . hydrocortisone 2.5 % ointment Apply 1 application topically as needed.   . Ibuprofen 200 MG CAPS Take 3 capsules by mouth as needed.  . Magnesium Malate 1250 (141.7 Mg) MG TABS Take 3 tablets by mouth daily.   . methotrexate 50 MG/2ML injection Inject 0.8 mLs (20 mg total) into the skin once a week.  . Multiple  Vitamin (MULTI-VITAMINS) TABS Take 1 tablet by mouth daily.  . Omega-3 1000 MG CAPS Take 1 tablet by mouth daily.  Marland Kitchen ORENCIA 125 MG/ML SOSY INJECT THE CONTENTS OF 1 SYRINGE UNDER THE SKIN EVERY WEEK  . Tuberculin-Allergy Syringes 27G X 1/2" 1 ML KIT Inject 1 Syringe into the skin once a week. To be used weekly with methotrexate.  . TURMERIC CURCUMIN PO Take by mouth daily.  . zoledronic acid (RECLAST) 5 MG/100ML SOLN injection Inject 5 mg into the vein once. Takes yearly  . zolpidem (AMBIEN) 10 MG tablet Take 0.5 tablets (5 mg total) by mouth at bedtime as needed for sleep.     Allergies:   Adalimumab, Diclofenac, Dicyclomine, Doxycycline hyclate, Enbrel [etanercept], Fosamax [alendronate sodium], Methotrexate derivatives, Nickel,  Piroxicam, Salagen [pilocarpine hcl], Sulfamethoxazole-trimethoprim, Tramadol hcl, and Plaquenil [hydroxychloroquine]   Social History   Socioeconomic History  . Marital status: Married    Spouse name: Not on file  . Number of children: Not on file  . Years of education: Not on file  . Highest education level: Not on file  Occupational History  . Occupation: housewife    Employer: Not Employed  Social Needs  . Financial resource strain: Not on file  . Food insecurity    Worry: Not on file    Inability: Not on file  . Transportation needs    Medical: Not on file    Non-medical: Not on file  Tobacco Use  . Smoking status: Never Smoker  . Smokeless tobacco: Never Used  Substance and Sexual Activity  . Alcohol use: No    Alcohol/week: 0.0 standard drinks  . Drug use: No  . Sexual activity: Yes    Partners: Male    Birth control/protection: Surgical    Comment: TAH  Lifestyle  . Physical activity    Days per week: Not on file    Minutes per session: Not on file  . Stress: Not on file  Relationships  . Social Herbalist on phone: Not on file    Gets together: Not on file    Attends religious service: Not on file    Active member of club or organization: Not on file    Attends meetings of clubs or organizations: Not on file    Relationship status: Not on file  Other Topics Concern  . Not on file  Social History Narrative  . Not on file     Family History: The patient's family history includes Breast cancer (age of onset: 62) in her sister; Cancer (age of onset: 29) in her sister; Heart disease in her brother; Thyroid disease in her sister.  ROS:   Please see the history of present illness.    She has rheumatoid arthritis and Sjogren's syndrome.  She has been diagnosed with asthma.  Disease.  She does not have a family history of vascular disease.  She gives out of energy late in the afternoon when her chores include tending to a young grandchild.  She does  not sleep as well as she used to.  All other systems reviewed and are negative.  EKGs/Labs/Other Studies Reviewed:    The following studies were reviewed today: No Deanna or recent cardiac imaging.  EKG:  EKG demonstrates sinus bradycardia, 59 bpm, left anterior hemiblock, poor R wave progression, occasional PVCs.  Recent Labs: 06/09/2018: TSH 1.72 09/28/2018: ALT 20; BUN 14; Creat 0.68; Hemoglobin 15.4; Platelets 242; Potassium 4.9; Sodium 140  Recent Lipid Panel  Component Value Date/Time   CHOL 228 (H) 06/09/2018 0838   TRIG 133.0 06/09/2018 0838   HDL 68.10 06/09/2018 0838   CHOLHDL 3 06/09/2018 0838   VLDL 26.6 06/09/2018 0838   LDLCALC 134 (H) 06/09/2018 0838   LDLDIRECT 144.2 02/02/2013 0735    Physical Exam:    VS:  BP 118/76   Pulse (!) 59   Ht 5' 7"  (1.702 m)   Wt 174 lb (78.9 kg)   LMP 01/06/1993   SpO2 97%   BMI 27.25 kg/m     Wt Readings from Last 3 Encounters:  11/24/18 174 lb (78.9 kg)  10/29/18 177 lb 9.6 oz (80.6 kg)  09/01/18 174 lb 9.6 oz (79.2 kg)     GEN: Parents. No acute distress HEENT: Normal NECK: No JVD. LYMPHATICS: No lymphadenopathy CARDIAC:  RRR without murmur, gallop, or edema. VASCULAR:  Normal Pulses. No bruits. RESPIRATORY:  Clear to auscultation without rales, wheezing or rhonchi  ABDOMEN: Soft, non-tender, non-distended, No pulsatile mass, MUSCULOSKELETAL: No deformity  SKIN: Warm and dry NEUROLOGIC:  Alert and oriented x 3 PSYCHIATRIC:  Normal affect   ASSESSMENT:    1. Coronary artery calcification seen on CT scan   2. Pure hypercholesterolemia   3. Obstructive sleep apnea syndrome   4. Rheumatoid arthritis involving multiple sites with positive rheumatoid factor ([place])   5. Educated about COVID-19 virus infection   6. SOB (shortness of breath)   7. DOE (dyspnea on exertion)    PLAN:    In order of problems listed above:  1. To this point asymptomatic coronary artery disease first diagnosed in 2015 when calcification  was noted in coronaries on a noncardiac CT scan.  The angiogram with FFR if indicated. 2. Less than 70.  DL was 134 in June 2020.  Statin therapy should be initiated. 3. Sleep apnea was not discussed. 4. This diagnosis was noted and may have some bearing if there is underlying interstitial lung disease. 5. 3W's is acknowledged and endorsed into lifestyle changes. 6. Could be primarily pulmonary.  Rule out cardiac component with BNP, 2D Doppler echocardiogram to assess right and left ventricular function and quantitate pulmonary artery pressures, and coronary CTA as noted above to rule out obstructive coronary disease. 7. Same as above.  Overall education and awareness concerning primaryrisk prevention was discussed in detail: LDL less than 70, hemoglobin A1c less than 7, blood pressure target less than 130/80 mmHg, >150 minutes of moderate aerobic activity per week, avoidance of smoking, weight control (via diet and exercise), and continued surveillance/management of/for obstructive sleep apnea.    Medication Adjustments/Labs and Tests Ordered: Current medicines are reviewed at length with the patient today.  Concerns regarding medicines are outlined above.  Orders Placed This Encounter  Procedures  . CT CORONARY MORPH W/CTA COR W/SCORE W/CA W/CM &/OR WO/CM  . CT CORONARY FRACTIONAL FLOW RESERVE DATA PREP  . CT CORONARY FRACTIONAL FLOW RESERVE FLUID ANALYSIS  . Pro b natriuretic peptide  . Basic metabolic panel  . EKG 12-Lead  . ECHOCARDIOGRAM COMPLETE   Meds ordered this encounter  Medications  . metoprolol tartrate (LOPRESSOR) 50 MG tablet    Sig: Take one tablet by mouth 2 hours prior to CT    Dispense:  1 tablet    Refill:  0    Patient Instructions  Medication Instructions:  Your physician recommends that you continue on your current medications as directed. Please refer to the Current Medication list given to you today.  *If you  need a refill on your cardiac medications  before your next appointment, please call your pharmacy*  Lab Work: BMET and Pro BNP today  If you have labs (blood work) drawn today and your tests are completely normal, you will receive your results only by: Marland Kitchen MyChart Message (if you have MyChart) OR . A paper copy in the mail If you have any lab test that is abnormal or we need to change your treatment, we will call you to review the results.  Testing/Procedures: Your physician has requested that you have an echocardiogram. Echocardiography is a painless test that uses sound waves to create images of your heart. It provides your doctor with information about the size and shape of your heart and how well your heart's chambers and valves are working. This procedure takes approximately one hour. There are no restrictions for this procedure.  Your physician recommends that you have a Coronary CT performed  Follow-Up: At [facility], you and your health needs are our priority.  As part of our continuing mission to provide you with exceptional heart care, we have created designated Provider Care Teams.  These Care Teams include your primary Cardiologist (physician) and Advanced Practice Providers (APPs -  Physician Assistants and Nurse Practitioners) who all work together to provide you with the care you need, when you need it.  Your next appointment:   As needed  The format for your next appointment:   In Person  Provider:   You may see Dr. Daneen Schick or one of the following Advanced Practice Providers on your designated Care Team:    Truitt Merle, NP  Cecilie Kicks, NP  Kathyrn Drown, NP   Other Instructions  Your cardiac CT will be scheduled at one of the below locations:   [facility] [address] [place], [place] [address] [phone]  [place] [address]  [place],  [address] [phone]  If scheduled at [facility], please  arrive at the [facility] main entrance of [facility] 30-45 minutes prior to test start time. Proceed to the [facility] Radiology Department (first floor) to check-in and test prep.  If scheduled at [facility], please arrive 15 mins early for check-in and test prep.  Please follow these instructions carefully (unless otherwise directed):  Hold all erectile dysfunction medications at least 3 days (72 hrs) prior to test.  On the Night Before the Test: . Be sure to Drink plenty of water. . Do not consume any caffeinated/decaffeinated beverages or chocolate 12 hours prior to your test. . Do not take any antihistamines 12 hours prior to your test.  On the Day of the Test: . Drink plenty of water. Do not drink any water within one hour of the test. . Do not eat any food 4 hours prior to the test. . You may take your regular medications prior to the test.  . Take metoprolol (Lopressor) two hours prior to test. . HOLD Furosemide/Hydrochlorothiazide morning of the test. . FEMALES- please wear underwire-free bra if available       After the Test: . Drink plenty of water. . After receiving IV contrast, you may experience a mild flushed feeling. This is normal. . On occasion, you may experience a mild rash up to 24 hours after the test. This is not dangerous. If this occurs, you can take Benadryl 25 mg and increase your fluid intake. . If you experience trouble breathing,  this can be serious. If it is severe call 911 IMMEDIATELY. If it is mild, please call our office. . If you take any of these medications: Glipizide/Metformin, Avandament, Glucavance, please do not take 48 hours after completing test unless otherwise instructed.   Once we have confirmed authorization from your insurance company, we will call you to set up a date and time for your test.   For non-scheduling related questions, please contact the cardiac imaging nurse navigator should you have  any questions/concerns: Marchia Bond, RN Navigator Cardiac Imaging [facility] Heart and V[facility] Services [phone] Office        Signed, Sinclair Grooms, MD  11/24/2018 1:03 PM    [place]

## 2018-11-24 ENCOUNTER — Ambulatory Visit (INDEPENDENT_AMBULATORY_CARE_PROVIDER_SITE_OTHER): Payer: Medicare Other | Admitting: Interventional Cardiology

## 2018-11-24 ENCOUNTER — Other Ambulatory Visit: Payer: Self-pay

## 2018-11-24 ENCOUNTER — Encounter: Payer: Self-pay | Admitting: Interventional Cardiology

## 2018-11-24 VITALS — BP 118/76 | HR 59 | Ht 67.0 in | Wt 174.0 lb

## 2018-11-24 DIAGNOSIS — E7800 Pure hypercholesterolemia, unspecified: Secondary | ICD-10-CM

## 2018-11-24 DIAGNOSIS — Z7189 Other specified counseling: Secondary | ICD-10-CM

## 2018-11-24 DIAGNOSIS — R0600 Dyspnea, unspecified: Secondary | ICD-10-CM

## 2018-11-24 DIAGNOSIS — R0602 Shortness of breath: Secondary | ICD-10-CM | POA: Diagnosis not present

## 2018-11-24 DIAGNOSIS — M0579 Rheumatoid arthritis with rheumatoid factor of multiple sites without organ or systems involvement: Secondary | ICD-10-CM

## 2018-11-24 DIAGNOSIS — I2510 Atherosclerotic heart disease of native coronary artery without angina pectoris: Secondary | ICD-10-CM

## 2018-11-24 DIAGNOSIS — R0609 Other forms of dyspnea: Secondary | ICD-10-CM

## 2018-11-24 DIAGNOSIS — G4733 Obstructive sleep apnea (adult) (pediatric): Secondary | ICD-10-CM | POA: Diagnosis not present

## 2018-11-24 MED ORDER — METOPROLOL TARTRATE 50 MG PO TABS: ORAL_TABLET | ORAL | 0 refills | Status: DC

## 2018-11-24 NOTE — Patient Instructions (Addendum)
Medication Instructions:  Your physician recommends that you continue on your current medications as directed. Please refer to the Current Medication list given to you today.  *If you need a refill on your cardiac medications before your next appointment, please call your pharmacy*  Lab Work: BMET and Pro BNP today  If you have labs (blood work) drawn today and your tests are completely normal, you will receive your results only by: Marland Kitchen MyChart Message (if you have MyChart) OR . A paper copy in the mail If you have any lab test that is abnormal or we need to change your treatment, we will call you to review the results.  Testing/Procedures: Your physician has requested that you have an echocardiogram. Echocardiography is a painless test that uses sound waves to create images of your heart. It provides your doctor with information about the size and shape of your heart and how well your heart's chambers and valves are working. This procedure takes approximately one hour. There are no restrictions for this procedure.  Your physician recommends that you have a Coronary CT performed  Follow-Up: At [facility], you and your health needs are our priority.  As part of our continuing mission to provide you with exceptional heart care, we have created designated Provider Care Teams.  These Care Teams include your primary Cardiologist (physician) and Advanced Practice Providers (APPs -  Physician Assistants and Nurse Practitioners) who all work together to provide you with the care you need, when you need it.  Your next appointment:   As needed  The format for your next appointment:   In Person  Provider:   You may see Dr. Daneen Schick or one of the following Advanced Practice Providers on your designated Care Team:    Truitt Merle, NP  Cecilie Kicks, NP  Kathyrn Drown, NP   Other Instructions  Your cardiac CT will be scheduled at one of the below locations:   [facility] [address] [place], [place] [address] [phone]  [facility] [address] [place], [place] [address] [phone]  If scheduled at [facility], please arrive at the [facility] main entrance of [facility] 30-45 minutes prior to test start time. Proceed to the [facility] Radiology Department (first floor) to check-in and test prep.  If scheduled at [facility], please arrive 15 mins early for check-in and test prep.  Please follow these instructions carefully (unless otherwise directed):  Hold all erectile dysfunction medications at least 3 days (72 hrs) prior to test.  On the Night Before the Test: . Be sure to Drink plenty of water. . Do not consume any caffeinated/decaffeinated beverages or chocolate 12 hours prior to your test. . Do not take any antihistamines 12 hours prior to your test.  On the Day of the Test: . Drink plenty of water. Do not drink any water within one hour of the test. . Do not eat any food 4 hours prior to the test. . You may take your regular medications prior to the test.  . Take metoprolol (Lopressor) two hours prior to test. . HOLD Furosemide/Hydrochlorothiazide morning of the test. . FEMALES- please wear underwire-free bra if available       After the Test: . Drink plenty of water. . After receiving IV contrast, you may experience a mild flushed feeling. This is normal. . On occasion, you may experience a mild rash up to 24  hours after the test. This is not dangerous. If this occurs, you can take Benadryl 25 mg and increase your fluid intake. . If you experience trouble breathing, this can be serious. If it is severe call 911 IMMEDIATELY. If it is mild, please call our office. . If you take any of these medications: Glipizide/Metformin, Avandament, Glucavance, please do not take 48 hours after completing test unless otherwise instructed.   Once  we have confirmed authorization from your insurance company, we will call you to set up a date and time for your test.   For non-scheduling related questions, please contact the cardiac imaging nurse navigator should you have any questions/concerns: Marchia Bond, RN Navigator Cardiac Imaging [facility] [phone] Office

## 2018-11-25 LAB — BASIC METABOLIC PANEL
BUN/Creatinine Ratio: 13 (ref 12–28)
BUN: 9 mg/dL (ref 8–27)
CO2: 23 mmol/L (ref 20–29)
Calcium: 9.7 mg/dL (ref 8.7–10.3)
Chloride: 104 mmol/L (ref 96–106)
Creatinine, Ser: 0.71 mg/dL (ref 0.57–1.00)
GFR calc Af Amer: 98 mL/min/{1.73_m2} (ref >59)
GFR calc non Af Amer: 85 mL/min/{1.73_m2} (ref >59)
Glucose: 96 mg/dL (ref 65–99)
Potassium: 5.1 mmol/L (ref 3.5–5.2)
Sodium: 142 mmol/L (ref 134–144)

## 2018-11-25 LAB — PRO B NATRIURETIC PEPTIDE: NT-Pro BNP: 54 pg/mL (ref 0–301)

## 2018-12-07 ENCOUNTER — Other Ambulatory Visit (HOSPITAL_COMMUNITY): Payer: Medicare Other

## 2018-12-07 ENCOUNTER — Encounter (HOSPITAL_COMMUNITY): Payer: Self-pay | Admitting: Interventional Cardiology

## 2018-12-10 ENCOUNTER — Telehealth: Payer: Self-pay | Admitting: Rheumatology

## 2018-12-10 ENCOUNTER — Other Ambulatory Visit: Payer: Self-pay

## 2018-12-10 DIAGNOSIS — Z79899 Other long term (current) drug therapy: Secondary | ICD-10-CM

## 2018-12-10 NOTE — Progress Notes (Signed)
Virtual Visit via Video Note  I connected with [name] on 12/14/18 at  8:40 AM EST by a video enabled telemedicine application and verified that I am speaking with the correct person using two identifiers.  Location: Patient: Home  Provider: Clinic  This service was conducted via virtual visit.  Both audio and visual tools were used.  The patient was located at home. I was located in my office.  Consent was obtained prior to the virtual visit and is aware of possible charges through their insurance for this visit.  The patient is an established patient.  Dr. Estanislado Pandy, MD conducted the virtual visit and Hazel Sams, PA-C acted as scribe during the service.  Office staff helped with scheduling follow up visits after the service was conducted.   I discussed the limitations of evaluation and management by telemedicine and the availability of in person appointments. The patient expressed understanding and agreed to proceed.  CC: Pain in both feet  History of Present Illness: Patient is a 73 year old female with a past medical history of seropositive rheumatoid arthritis and osteoarthritis. She is on orencia 125 mg sq weekly injections and MTX 0.8 ml sq once weekly.  She has not missed any doses recently.  She has not had any recent flares.  She has been experiencing pain in both feet but no joint swelling.  She has not had any increased SOB recently.  She was evaluated by Dr. Vaughan Browner and was started on Symbicort. She was referred to cardiology and is scheduled for an Echo next week.    Review of Systems  Constitutional: Negative for fever and malaise/fatigue.  Eyes: Negative for photophobia, pain, discharge and redness.  Respiratory: Negative for cough, shortness of breath and wheezing.   Cardiovascular: Negative for chest pain and palpitations.  Gastrointestinal: Negative for blood in stool, constipation and diarrhea.  Genitourinary: Negative for dysuria.  Musculoskeletal: Positive for  joint pain. Negative for back pain, myalgias and neck pain.       +Morning stiffness   Skin: Negative for rash.  Neurological: Negative for dizziness and headaches.  Psychiatric/Behavioral: Negative for depression. The patient is not nervous/anxious and does not have insomnia.       Observations/Objective: Physical Exam  Constitutional: She is oriented to person, place, and time.  Neurological: She is alert and oriented to person, place, and time.  Psychiatric: Mood, memory, affect and judgment normal.    Patient reports morning stiffness for 5  minutes.   Patient reports nocturnal pain.  Difficulty dressing/grooming: Denies Difficulty climbing stairs: Denies Difficulty getting out of chair: Reports Difficulty using hands for taps, buttons, cutlery, and/or writing: Denies   She currently rates her RA a 3/10.   Assessment and Plan: Visit Diagnoses: Rheumatoid arthritis involving multiple sites with positive rheumatoid factor (HCC) - +RF, +CCP, ANA-,severe erosive: She has not had any recent rheumatoid arthritis flares.  She has been having increased pain in both feet but no joint swelling.  She is clinically doing well on Orencia 125 mg sq injections every 7 days, MTX 0.8 ml sq once weekly, and folic acid 2 mg po daily. She has not missed any doses recently.  No recent infections.  She will continue on the current treatment regimen.  She was advised to notify us if she develops increased joint pain or joint swelling.  She will follow up in 4 months.   High risk medication use - Orencia 125 mg every 7 days, methotrexate 0.8 mL's every 7  days, and folic acid 1 mg 2 tablets daily.  Last TB gold negative on 03/26/2018 and will monitor yearly. Future order placed today.  CBC and CMP were drawn on 09/28/18.  She is due to update lab work.  Standing orders are in place.    Sicca syndrome ([place]) - She has chronic sicca symptoms. Her symptoms have been severe recently.  She could not tolerate  taking pilocarpine in the past.   Primary osteoarthritis of both hands -She has no hand pain or joint swelling at this time.  Joint protection and muscle strengthening were discussed.    Primary osteoarthritis of both knees -She experiences stiffness in both knee joints.  She has no joint swelling at this time.    Primary osteoarthritis of both feet-She has been experiencing increased pain in both feet.  No joint swelling.  She experiences pain in her feet at night while resting.  She had x-rays of both feet on 02/02/18, which were consistent with rheumatoid arthritis and osteoarthritis overlap. We discussed the importance of wearing proper fitting shoes.   Spondylosis of lumbar region without myelopathy or radiculopathy - She has no lower back pain at this time. She has no symptoms of radiculopathy.   DDD (degenerative disc disease), lumbar -She is not having any discomfort at this time.   Age-related osteoporosis without current pathological fracture - She received her third Reclast infusion on 07/08/2018.  She is taking a calcium and vitamin D supplement daily. Last DEXA on 02/04/2018 showed T score of -3.1 and BMD 0.506 at left hip and a -13% change in BMD since January 2018.  She would like to have a repeat DEXA in January 2021. Order is in place.  Shortness of breath - She had a high resolution CT on 08/04/18 which revealed minimal ground glass opacities.  There was no evidence of interstitial lung disease.  She was evaluated by Dr. Vaughan Browner on 09/01/18.  She was started on Symbicort for mild asthma and he recommended repeating the high resolution CT on 1 year.  He also referred her to cardiology for reevaluation of coronary atherosclerosis.  She is scheduled for an echo next week.   Other medical conditions are listed as follows:   History of sleep apnea   History of hyperlipidemia   History of gastroesophageal reflux (GERD)   Abnormal SPEP - evaluated by Dr. Irene Limbo.   History of  rosacea   Follow Up Instructions: She will follow up in 4 months.    I discussed the assessment and treatment plan with the patient. The patient was provided an opportunity to ask questions and all were answered. The patient agreed with the plan and demonstrated an understanding of the instructions.   The patient was advised to call back or seek an in-person evaluation if the symptoms worsen or if the condition fails to improve as anticipated.  I provided 25 minutes of non-face-to-face time during this encounter.   Bo Merino, MD   Scribed by-  Hazel Sams, PA-C

## 2018-12-10 NOTE — Telephone Encounter (Signed)
Lab orders have been released for [facility].  

## 2018-12-10 NOTE — Telephone Encounter (Signed)
Patient going to [facility] lab for lab work. Please release orders.

## 2018-12-13 ENCOUNTER — Telehealth: Payer: Self-pay | Admitting: Rheumatology

## 2018-12-13 NOTE — Telephone Encounter (Signed)
Patient left a message stating she still did not receive email  for 8:40am virtual appt. Please resend.

## 2018-12-14 ENCOUNTER — Other Ambulatory Visit: Payer: Self-pay | Admitting: *Deleted

## 2018-12-14 ENCOUNTER — Other Ambulatory Visit: Payer: Self-pay

## 2018-12-14 ENCOUNTER — Telehealth (INDEPENDENT_AMBULATORY_CARE_PROVIDER_SITE_OTHER): Payer: Medicare Other | Admitting: Rheumatology

## 2018-12-14 ENCOUNTER — Encounter: Payer: Self-pay | Admitting: Rheumatology

## 2018-12-14 DIAGNOSIS — Z8639 Personal history of other endocrine, nutritional and metabolic disease: Secondary | ICD-10-CM

## 2018-12-14 DIAGNOSIS — M810 Age-related osteoporosis without current pathological fracture: Secondary | ICD-10-CM

## 2018-12-14 DIAGNOSIS — Z79899 Other long term (current) drug therapy: Secondary | ICD-10-CM

## 2018-12-14 DIAGNOSIS — M170 Bilateral primary osteoarthritis of knee: Secondary | ICD-10-CM

## 2018-12-14 DIAGNOSIS — M5136 Other intervertebral disc degeneration, lumbar region: Secondary | ICD-10-CM

## 2018-12-14 DIAGNOSIS — Z872 Personal history of diseases of the skin and subcutaneous tissue: Secondary | ICD-10-CM

## 2018-12-14 DIAGNOSIS — R778 Other specified abnormalities of plasma proteins: Secondary | ICD-10-CM | POA: Diagnosis not present

## 2018-12-14 DIAGNOSIS — Z8719 Personal history of other diseases of the digestive system: Secondary | ICD-10-CM

## 2018-12-14 DIAGNOSIS — M0579 Rheumatoid arthritis with rheumatoid factor of multiple sites without organ or systems involvement: Secondary | ICD-10-CM | POA: Diagnosis not present

## 2018-12-14 DIAGNOSIS — M47816 Spondylosis without myelopathy or radiculopathy, lumbar region: Secondary | ICD-10-CM

## 2018-12-14 DIAGNOSIS — I2510 Atherosclerotic heart disease of native coronary artery without angina pectoris: Secondary | ICD-10-CM

## 2018-12-14 DIAGNOSIS — Z8669 Personal history of other diseases of the nervous system and sense organs: Secondary | ICD-10-CM

## 2018-12-14 DIAGNOSIS — M19041 Primary osteoarthritis, right hand: Secondary | ICD-10-CM | POA: Diagnosis not present

## 2018-12-14 DIAGNOSIS — M19042 Primary osteoarthritis, left hand: Secondary | ICD-10-CM

## 2018-12-14 DIAGNOSIS — M3500 Sicca syndrome, unspecified: Secondary | ICD-10-CM

## 2018-12-14 MED ORDER — ZOLPIDEM TARTRATE 10 MG PO TABS: 5.0000 mg | ORAL_TABLET | Freq: Every evening | ORAL | 0 refills | Status: DC | PRN

## 2018-12-15 DIAGNOSIS — Z79899 Other long term (current) drug therapy: Secondary | ICD-10-CM | POA: Diagnosis not present

## 2018-12-16 LAB — CBC WITH DIFFERENTIAL/PLATELET
Absolute Monocytes: 792 cells/uL (ref 200–950)
Basophils Absolute: 79 cells/uL (ref 0–200)
Basophils Relative: 1.2 %
Eosinophils Absolute: 383 cells/uL (ref 15–500)
Eosinophils Relative: 5.8 %
HCT: 43.9 % (ref 35.0–45.0)
Hemoglobin: 15.1 g/dL (ref 11.7–15.5)
Lymphs Abs: 1868 cells/uL (ref 850–3900)
MCH: 32.8 pg (ref 27.0–33.0)
MCHC: 34.4 g/dL (ref 32.0–36.0)
MCV: 95.2 fL (ref 80.0–100.0)
MPV: 10.7 fL (ref 7.5–12.5)
Monocytes Relative: 12 %
Neutro Abs: 3478 cells/uL (ref 1500–7800)
Neutrophils Relative %: 52.7 %
Platelets: 271 10*3/uL (ref 140–400)
RBC: 4.61 10*6/uL (ref 3.80–5.10)
RDW: 14.1 % (ref 11.0–15.0)
Total Lymphocyte: 28.3 %
WBC: 6.6 10*3/uL (ref 3.8–10.8)

## 2018-12-16 LAB — COMPLETE METABOLIC PANEL WITH GFR
AG Ratio: 1.7 (calc) (ref 1.0–2.5)
ALT: 21 U/L (ref 6–29)
AST: 20 U/L (ref 10–35)
Albumin: 3.8 g/dL (ref 3.6–5.1)
Alkaline phosphatase (APISO): 70 U/L (ref 37–153)
BUN: 13 mg/dL (ref 7–25)
CO2: 26 mmol/L (ref 20–32)
Calcium: 9.4 mg/dL (ref 8.6–10.4)
Chloride: 106 mmol/L (ref 98–110)
Creat: 0.71 mg/dL (ref 0.60–0.93)
GFR, Est African American: 98 mL/min/{1.73_m2} (ref >=60)
GFR, Est Non African American: 84 mL/min/{1.73_m2} (ref >=60)
Globulin: 2.3 g/dL (calc) (ref 1.9–3.7)
Glucose, Bld: 90 mg/dL (ref 65–99)
Potassium: 4.5 mmol/L (ref 3.5–5.3)
Sodium: 140 mmol/L (ref 135–146)
Total Bilirubin: 0.4 mg/dL (ref 0.2–1.2)
Total Protein: 6.1 g/dL (ref 6.1–8.1)

## 2018-12-16 NOTE — Progress Notes (Signed)
CBC and CMP WNL

## 2018-12-21 ENCOUNTER — Other Ambulatory Visit: Payer: Self-pay

## 2018-12-21 ENCOUNTER — Ambulatory Visit (HOSPITAL_COMMUNITY): Payer: Medicare Other | Attending: Cardiovascular Disease

## 2018-12-21 DIAGNOSIS — R0602 Shortness of breath: Secondary | ICD-10-CM | POA: Diagnosis not present

## 2018-12-23 ENCOUNTER — Telehealth: Payer: Self-pay | Admitting: Interventional Cardiology

## 2018-12-23 NOTE — Telephone Encounter (Signed)
Patient returning Jennifer's call in regards to her echo results.

## 2018-12-23 NOTE — Telephone Encounter (Signed)
Informed pt of results. Pt verbalized understanding. 

## 2019-01-03 ENCOUNTER — Other Ambulatory Visit: Payer: Medicare Other

## 2019-01-05 ENCOUNTER — Telehealth: Payer: Self-pay

## 2019-01-05 ENCOUNTER — Encounter: Payer: Self-pay | Admitting: Family Medicine

## 2019-01-05 NOTE — Telephone Encounter (Signed)
Patient was exposed to [name] on 01/01/2019-01/02/2019. Patient and her husband are both showing symptoms today. Both the patient and her husband tested negative for their first test and both will be re-tested since they are symptomatic at this time. I advised patient to hold both MTX and Orencia until symptoms resolve. I advised patient to call the office if she test positive so we can provide further instructions for holding medication. Patient verbalized understanding.   Patient also questioned the COVID vaccine and I advised patient that Dr. Estanislado Pandy does recommend the vaccine and she is okay to receive it due to the vaccine not being live. Patient verbalized understanding.

## 2019-01-06 ENCOUNTER — Encounter: Payer: Self-pay | Admitting: Family Medicine

## 2019-01-06 ENCOUNTER — Ambulatory Visit (INDEPENDENT_AMBULATORY_CARE_PROVIDER_SITE_OTHER): Payer: Medicare Other | Admitting: Family Medicine

## 2019-01-06 ENCOUNTER — Other Ambulatory Visit: Payer: Medicare Other

## 2019-01-06 DIAGNOSIS — J069 Acute upper respiratory infection, unspecified: Secondary | ICD-10-CM | POA: Diagnosis not present

## 2019-01-06 DIAGNOSIS — Z20828 Contact with and (suspected) exposure to other viral communicable diseases: Secondary | ICD-10-CM | POA: Diagnosis not present

## 2019-01-06 DIAGNOSIS — I2510 Atherosclerotic heart disease of native coronary artery without angina pectoris: Secondary | ICD-10-CM

## 2019-01-06 DIAGNOSIS — Z20822 Contact with and (suspected) exposure to covid-19: Secondary | ICD-10-CM | POA: Insufficient documentation

## 2019-01-06 MED ORDER — ALBUTEROL SULFATE HFA 108 (90 BASE) MCG/ACT IN AERS: 2.0000 | INHALATION_SPRAY | RESPIRATORY_TRACT | 3 refills | Status: AC | PRN

## 2019-01-06 NOTE — Patient Instructions (Addendum)
Please let me know when you get your covid 19 test result   Treat your symptoms at home Rest and drink fluids Albuterol inhaler was sent in  Watch for shortness of breath or worse cough- go to urgent care or ER if suddenly worse  Isolate yourself until negative result and symptoms are better

## 2019-01-06 NOTE — Progress Notes (Signed)
Virtual Visit via Video Note  I connected with Deanna Norris on 01/06/19 at  9:00 AM EST by a video enabled telemedicine application and verified that I am speaking with the correct person using two identifiers.  Location: Patient: home Provider: office    I discussed the limitations of evaluation and management by telemedicine and the availability of in person appointments. The patient expressed understanding and agreed to proceed.  Parties involved in encounter  Patient: Deanna Norris  Provider:  Loura Pardon MD    History of Present Illness: Pt presents with covid symptoms -since wed pm   She was exposed to covid 12/26  She and her husband have symptoms Husband just got test result back positive   Had a family gathering of 49 people  No one is very sick fortunately   Temperature has been 101.1  Chills and body aches   Otc: taking some vitamins  Tylenol for the fever   Nasal symptoms -mild  Cough- very mild / non productive  Some headache -frontal  No dizziness   No sob  No loss of smell or taste   No GI symptoms    She has low grade fever and some mild cough - pending test result  She was tested at [facility] (screening at the [facility])   She is interested in the infusion clinic  She has RA (currently holding her biologic medicines)   Son has some early symptoms as well-pend test result  Patient Active Problem List   Diagnosis Date Noted  . Viral URI with cough 01/06/2019  . Exposure to COVID-19 virus 01/06/2019  . Allergic rhinitis 04/08/2018  . Fatigue 02/17/2018  . Immunocompromised ([place]) 02/17/2018  . 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 Primary osteoarthritis of both hands 01/11/2016  . 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/11/2016  . High risk medication use 11/15/2015  . Sjogren's syndrome ([place]) 10/31/2015  . Chronic  constipation 10/23/2015  . Rosacea 10/23/2015  . Rheumatoid arthritis ([place]) 03/29/2015  . Coronary artery calcification seen on CT scan 11/17/2013  . Recurrent sinusitis 09/16/2013  . Encounter for Medicare annual wellness exam 01/31/2013  . Dyspnea on exertion 09/14/2012  . Nodule of right lung 09/14/2012  . Hyperlipidemia 05/08/2009  . 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 Asthma 05/08/2009  . GERD 05/08/2009  . MENOPAUSAL DISORDER 05/08/2009  . ARTHRITIS, RHEUMATOID 05/08/2009  . Osteoporosis 05/08/2009  . SPONDYLOLISTHESIS 05/08/2009  . Sleep apnea 05/08/2009   Past Medical History:  Diagnosis Date  . Asthma   . Fibroid   . GERD (gastroesophageal reflux disease)   . Insomnia   . Menopausal symptoms   . Osteoporosis   . Postmenopausal HRT (hormone replacement therapy)   . RA (rheumatoid arthritis) ([place]) 11/2004   methotrexate and Embrel  . Sjogren's disease ([place])   . Sleep apnea 2008   C-Pap  . Vitamin D deficiency    Past Surgical History:  Procedure Laterality Date  . ABDOMINAL HYSTERECTOMY  01/1993   partial with AP repair, secondary to fibroids  . BREAST SURGERY  1991   breast biopsy  . CYSTOCELE REPAIR  10/2007   with graft  . MELANOMA EXCISION Right 01/2012   excision right posterior arm  . NASAL SINUS SURGERY  07/1991  . VAGINAL DELIVERY     x3   Social History   Tobacco Use  . Smoking status: Never Smoker  . Smokeless tobacco:  Never Used  Substance Use Topics  . Alcohol use: No    Alcohol/week: 0.0 standard drinks  . Drug use: No   Family History  Problem Relation Age of Onset  . Breast cancer Sister 29  . Thyroid disease Sister   . Cancer Sister 54       Leukemia--CLL  . Heart disease Brother    Allergies  Allergen Reactions  . Adalimumab     REACTION: frequent sinus infection  . Diclofenac     unk  . Dicyclomine     Turn teeth gray  . Doxycycline Hyclate Other (See Comments)    "turns teeth gray"  . Enbrel [Etanercept]     SINUS  INFECTIONS  . Fosamax [Alendronate Sodium]     Acid reflux  . Methotrexate Derivatives Nausea Only    Oral metotrexate  . Nickel   . Piroxicam     unk  . Salagen [Pilocarpine Hcl] Other (See Comments)    Excessive sweating, headaches  . Sulfamethoxazole-Trimethoprim     REACTION: face and hands rash and felt burning sensation  . Tramadol Hcl     REACTION: nausea  . Plaquenil [Hydroxychloroquine] Rash   Current Outpatient Medications on File Prior to Visit  Medication Sig Dispense Refill  . acetaminophen (TYLENOL) 500 MG tablet Take 500 mg by mouth every 8 (eight) hours as needed.    Marland Kitchen aspirin 81 MG tablet Take 81 mg by mouth daily.     . budesonide (PULMICORT) 180 MCG/ACT inhaler Inhale 1 puff into the lungs daily as needed.    . Calcium Carbonate-Vitamin D (CALCIUM 500/D PO) Take 1 tablet by mouth 2 (two) times daily.    . Cholecalciferol (VITAMIN D3) 2000 UNITS TABS Take 1 tablet by mouth daily.      Marland Kitchen desonide (DESOWEN) 0.05 % lotion Apply topically as needed.    . diclofenac sodium (VOLTAREN) 1 % GEL Apply 1 application topically at bedtime as needed.     . famotidine (PEPCID) 40 MG tablet Take 1 tablet (40 mg total) by mouth 2 (two) times daily. 180 tablet 3  . fluticasone (FLONASE) 50 MCG/ACT nasal spray USE 2 SPRAYS IN EACH NOSTRIL DAILY AS NEEDED FOR RHINITIS OR ALLERGIES 48 g 3  . folic acid (FOLVITE) 1 MG tablet TAKE 2 TABLETS DAILY 180 tablet 3  . hydrocortisone 2.5 % ointment Apply 1 application topically as needed.     . Ibuprofen 200 MG CAPS Take 3 capsules by mouth as needed.    . Magnesium Malate 1250 (141.7 Mg) MG TABS Take 3 tablets by mouth daily.     . methotrexate 50 MG/2ML injection Inject 0.8 mLs (20 mg total) into the skin once a week. 10 mL 0  . metoprolol tartrate (LOPRESSOR) 50 MG tablet Take one tablet by mouth 2 hours prior to CT 1 tablet 0  . Multiple Vitamin (MULTI-VITAMINS) TABS Take 1 tablet by mouth daily.    . Omega-3 1000 MG CAPS Take 1 tablet by  mouth daily.    Marland Kitchen ORENCIA 125 MG/ML SOSY INJECT THE CONTENTS OF 1 SYRINGE UNDER THE SKIN EVERY WEEK 4 mL 2  . Tuberculin-Allergy Syringes 27G X 1/2" 1 ML KIT Inject 1 Syringe into the skin once a week. To be used weekly with methotrexate.    . TURMERIC CURCUMIN PO Take by mouth daily.    . zoledronic acid (RECLAST) 5 MG/100ML SOLN injection Inject 5 mg into the vein once. Takes yearly    . zolpidem (AMBIEN)  10 MG tablet Take 0.5 tablets (5 mg total) by mouth at bedtime as needed for sleep. 30 tablet 0   No current facility-administered medications on file prior to visit.   Review of Systems  Constitutional: Positive for chills, fever and malaise/fatigue.  HENT: Positive for congestion. Negative for ear pain, sinus pain and sore throat.   Eyes: Negative for blurred vision, discharge and redness.  Respiratory: Positive for cough and sputum production. Negative for shortness of breath, wheezing and stridor.   Cardiovascular: Negative for chest pain, palpitations and leg swelling.  Gastrointestinal: Negative for abdominal pain, diarrhea, nausea and vomiting.  Musculoskeletal: Negative for myalgias.  Skin: Negative for rash.  Neurological: Positive for headaches. Negative for dizziness.       Observations/Objective: Patient appears well, in no distress (mildly fatigued) Weight is baseline  No facial swelling or asymmetry Normal voice-not hoarse and no slurred speech No obvious tremor or mobility impairment Moving neck and UEs normally Able to hear the call well  No wheeze or shortness of breath during interview  (cough is mildly junky sounding) Talkative and mentally sharp with no cognitive changes No skin changes on face or neck , no rash or pallor Affect is normal    Assessment and Plan: Problem List Items Addressed This Visit      Respiratory   Viral URI with cough    Strongly suspect covid-19 since her family has it  Disc imp of isolation /rest fluids inst her to contact us  when test results return  Refilled albuterol  Disc sympt care/fever control  May be candidate for monoclonal ab infusion if positive result  inst to update and seek care in UC or ER if symptoms suddenly worsen        Other   Exposure to COVID-19 virus       Follow Up Instructions: Please let me know when you get your covid 19 test result   Treat your symptoms at home Rest and drink fluids Albuterol inhaler was sent in  Watch for shortness of breath or worse cough- go to urgent care or ER if suddenly worse  Isolate yourself until negative result and symptoms are better    I discussed the assessment and treatment plan with the patient. The patient was provided an opportunity to ask questions and all were answered. The patient agreed with the plan and demonstrated an understanding of the instructions.   The patient was advised to call back or seek an in-person evaluation if the symptoms worsen or if the condition fails to improve as anticipated.     Loura Pardon, MD

## 2019-01-07 ENCOUNTER — Encounter: Payer: Self-pay | Admitting: Family Medicine

## 2019-01-07 NOTE — Assessment & Plan Note (Signed)
Strongly suspect covid-19 since her family has it  Disc imp of isolation /rest fluids inst her to contact us when test results return  Refilled albuterol  Disc sympt care/fever control  May be candidate for monoclonal ab infusion if positive result  inst to update and seek care in UC or ER if symptoms suddenly worsen

## 2019-01-10 ENCOUNTER — Ambulatory Visit: Payer: Medicare Other | Attending: Internal Medicine

## 2019-01-10 ENCOUNTER — Encounter (INDEPENDENT_AMBULATORY_CARE_PROVIDER_SITE_OTHER): Payer: Self-pay

## 2019-01-10 ENCOUNTER — Other Ambulatory Visit: Payer: Medicare Other

## 2019-01-10 DIAGNOSIS — Z20822 Contact with and (suspected) exposure to covid-19: Secondary | ICD-10-CM

## 2019-01-11 ENCOUNTER — Telehealth: Payer: Self-pay | Admitting: Family Medicine

## 2019-01-11 LAB — NOVEL CORONAVIRUS, NAA: SARS-CoV-2, NAA: DETECTED — AB

## 2019-01-11 NOTE — Telephone Encounter (Signed)
Pt returning call. States she has viewed results, guidelines/precautions on MyChart.

## 2019-01-13 ENCOUNTER — Telehealth: Payer: Self-pay | Admitting: Nurse Practitioner

## 2019-01-13 NOTE — Telephone Encounter (Signed)
Called to Discuss with patient about Covid symptoms and the use of bamlanivimab, a monoclonal antibody infusion for those with mild to moderate Covid symptoms and at a high risk of hospitalization.     Pt is qualified for this infusion at the [facility] infusion center due to co-morbid conditions and/or a member of an at-risk group.     Unable to reach pt  

## 2019-01-14 ENCOUNTER — Encounter (INDEPENDENT_AMBULATORY_CARE_PROVIDER_SITE_OTHER): Payer: Self-pay

## 2019-01-16 ENCOUNTER — Encounter (INDEPENDENT_AMBULATORY_CARE_PROVIDER_SITE_OTHER): Payer: Self-pay

## 2019-01-17 ENCOUNTER — Ambulatory Visit: Payer: Medicare Other | Admitting: Obstetrics and Gynecology

## 2019-01-19 ENCOUNTER — Other Ambulatory Visit: Payer: Medicare Other

## 2019-01-21 ENCOUNTER — Ambulatory Visit (HOSPITAL_COMMUNITY): Payer: Medicare Other

## 2019-01-27 ENCOUNTER — Encounter: Payer: Self-pay | Admitting: Family Medicine

## 2019-02-01 ENCOUNTER — Telehealth: Payer: Self-pay | Admitting: Rheumatology

## 2019-02-01 DIAGNOSIS — M0579 Rheumatoid arthritis with rheumatoid factor of multiple sites without organ or systems involvement: Secondary | ICD-10-CM

## 2019-02-01 MED ORDER — ORENCIA 125 MG/ML ~~LOC~~ SOSY: PREFILLED_SYRINGE | SUBCUTANEOUS | 2 refills | Status: DC

## 2019-02-01 MED ORDER — METHOTREXATE SODIUM CHEMO INJECTION 50 MG/2ML: 20.0000 mg | INTRAMUSCULAR | 0 refills | Status: DC

## 2019-02-01 NOTE — Telephone Encounter (Signed)
Last Visit: 12/14/18  Next Visit: 04/22/19 Labs: 12/15/18 WNL TB Gold: 03/26/18 Neg   Okay to refill per Dr. Estanislado Pandy

## 2019-02-01 NOTE — Telephone Encounter (Signed)
Patient called requesting 2 prescription refills:    1. Orencia to be sent to [facility].  2.  Methotrexate to be sent to [facility]

## 2019-02-04 ENCOUNTER — Telehealth: Payer: Self-pay | Admitting: Rheumatology

## 2019-02-04 NOTE — Telephone Encounter (Signed)
Ok to apply for euflexxa injections for both knee joints.  She will require updated x-rays of both knee joints.  She can make an appointment to obtain these or we can order them at her first injection visit.

## 2019-02-04 NOTE — Telephone Encounter (Signed)
Please apply for visco. When making the appt, please add in the note Bil Knee X-rays needed. Thank you.

## 2019-02-04 NOTE — Telephone Encounter (Signed)
#  1.Patient going for Bone Density on Monday. Patient states [facility] does not have order. Please send over for patient. #2. Patient would like to get Visco in both knees. Patient has had Euflexxa in the past.

## 2019-02-07 ENCOUNTER — Encounter: Payer: Self-pay | Admitting: Obstetrics and Gynecology

## 2019-02-07 ENCOUNTER — Encounter: Payer: Self-pay | Admitting: Family Medicine

## 2019-02-07 DIAGNOSIS — Z1231 Encounter for screening mammogram for malignant neoplasm of breast: Secondary | ICD-10-CM | POA: Diagnosis not present

## 2019-02-07 DIAGNOSIS — L820 Inflamed seborrheic keratosis: Secondary | ICD-10-CM | POA: Diagnosis not present

## 2019-02-07 DIAGNOSIS — M810 Age-related osteoporosis without current pathological fracture: Secondary | ICD-10-CM | POA: Diagnosis not present

## 2019-02-07 DIAGNOSIS — L570 Actinic keratosis: Secondary | ICD-10-CM | POA: Diagnosis not present

## 2019-02-07 DIAGNOSIS — M8588 Other specified disorders of bone density and structure, other site: Secondary | ICD-10-CM | POA: Diagnosis not present

## 2019-02-08 ENCOUNTER — Telehealth: Payer: Self-pay | Admitting: *Deleted

## 2019-02-08 NOTE — Telephone Encounter (Signed)
Received DEXA results from [facility].  Date of Scan: 02/07/2019 Lowest T-score and site measured: Left Femoral Neck -2.9 Significant changes in BMD and site measured (5% and above): n/a  Current Regimen: Patient on Reclast. Last Infusion 07/08/18  Recommendation: Continue Reclast.   Patient advised of results.

## 2019-02-17 ENCOUNTER — Other Ambulatory Visit: Payer: Self-pay | Admitting: *Deleted

## 2019-02-17 DIAGNOSIS — I2510 Atherosclerotic heart disease of native coronary artery without angina pectoris: Secondary | ICD-10-CM

## 2019-02-21 ENCOUNTER — Other Ambulatory Visit: Payer: Medicare Other

## 2019-02-21 ENCOUNTER — Other Ambulatory Visit: Payer: Self-pay

## 2019-02-21 ENCOUNTER — Encounter (HOSPITAL_COMMUNITY): Payer: Self-pay

## 2019-02-21 DIAGNOSIS — I2510 Atherosclerotic heart disease of native coronary artery without angina pectoris: Secondary | ICD-10-CM

## 2019-02-22 ENCOUNTER — Other Ambulatory Visit: Payer: Self-pay

## 2019-02-22 ENCOUNTER — Telehealth (HOSPITAL_COMMUNITY): Payer: Self-pay | Admitting: Emergency Medicine

## 2019-02-22 LAB — BASIC METABOLIC PANEL
BUN/Creatinine Ratio: 14 (ref 12–28)
BUN: 9 mg/dL (ref 8–27)
CO2: 24 mmol/L (ref 20–29)
Calcium: 9.5 mg/dL (ref 8.7–10.3)
Chloride: 104 mmol/L (ref 96–106)
Creatinine, Ser: 0.66 mg/dL (ref 0.57–1.00)
GFR calc Af Amer: 101 mL/min/{1.73_m2} (ref >59)
GFR calc non Af Amer: 88 mL/min/{1.73_m2} (ref >59)
Glucose: 109 mg/dL — ABNORMAL HIGH (ref 65–99)
Potassium: 4.4 mmol/L (ref 3.5–5.2)
Sodium: 141 mmol/L (ref 134–144)

## 2019-02-22 NOTE — Telephone Encounter (Signed)
Please call, and schedule for Visco Knee Injections.  Schedule Orthovisc series, Bilateral Knees Buy and Bill PA not required.  Deductible does not apply. No Co-Pay Insurance with secondary to cover 100%.  Please add: patient needs x-rays at first injection appt.

## 2019-02-22 NOTE — Telephone Encounter (Signed)
Reaching out to patient to offer assistance regarding upcoming cardiac imaging study; pt verbalizes understanding of appt date/time, parking situation and where to check in, pre-test NPO status and medications ordered, and verified current allergies; name and call back number provided for further questions should they arise Oliveah Zwack RN Navigator Cardiac Imaging [facility] [phone] office [phone] cell 

## 2019-02-23 ENCOUNTER — Ambulatory Visit (INDEPENDENT_AMBULATORY_CARE_PROVIDER_SITE_OTHER): Payer: Medicare Other | Admitting: Obstetrics and Gynecology

## 2019-02-23 ENCOUNTER — Ambulatory Visit (HOSPITAL_COMMUNITY)
Admission: RE | Admit: 2019-02-23 | Discharge: 2019-02-23 | Disposition: A | Discharge status: Home / Self Care | Payer: Medicare Other | Source: Ambulatory Visit | Attending: Interventional Cardiology | Admitting: Interventional Cardiology

## 2019-02-23 ENCOUNTER — Other Ambulatory Visit: Payer: Self-pay

## 2019-02-23 ENCOUNTER — Encounter: Payer: Self-pay | Admitting: Obstetrics and Gynecology

## 2019-02-23 VITALS — BP 110/68 | HR 54 | Temp 96.9°F | Resp 12 | Ht 66.0 in | Wt 177.4 lb

## 2019-02-23 DIAGNOSIS — R0602 Shortness of breath: Secondary | ICD-10-CM | POA: Diagnosis not present

## 2019-02-23 DIAGNOSIS — R0600 Dyspnea, unspecified: Secondary | ICD-10-CM | POA: Diagnosis not present

## 2019-02-23 DIAGNOSIS — N898 Other specified noninflammatory disorders of vagina: Secondary | ICD-10-CM | POA: Diagnosis not present

## 2019-02-23 DIAGNOSIS — R0609 Other forms of dyspnea: Secondary | ICD-10-CM

## 2019-02-23 DIAGNOSIS — Z01419 Encounter for gynecological examination (general) (routine) without abnormal findings: Secondary | ICD-10-CM

## 2019-02-23 DIAGNOSIS — I2510 Atherosclerotic heart disease of native coronary artery without angina pectoris: Secondary | ICD-10-CM | POA: Insufficient documentation

## 2019-02-23 DIAGNOSIS — Z124 Encounter for screening for malignant neoplasm of cervix: Secondary | ICD-10-CM

## 2019-02-23 MED ORDER — NITROGLYCERIN 0.4 MG SL SUBL
0.8000 mg | SUBLINGUAL_TABLET | Freq: Once | SUBLINGUAL | Status: AC
  Administered 2019-02-23: 08:00:00 0.8 mg via SUBLINGUAL

## 2019-02-23 MED ORDER — NITROGLYCERIN 0.4 MG SL SUBL
SUBLINGUAL_TABLET | SUBLINGUAL | Status: AC
  Filled 2019-02-23: qty 1

## 2019-02-23 MED ORDER — IOHEXOL 350 MG/ML SOLN
100.0000 mL | Freq: Once | INTRAVENOUS | Status: AC | PRN
  Administered 2019-02-23: 09:00:00 100 mL via INTRAVENOUS

## 2019-02-23 MED ORDER — ESTRADIOL 10 MCG VA TABS: 1.0000 | ORAL_TABLET | VAGINAL | 3 refills | Status: DC

## 2019-02-23 NOTE — Patient Instructions (Signed)

## 2019-02-23 NOTE — Progress Notes (Signed)
74 y.o. G27P3003 Married Caucasian female here for annual exam.   Patient diagnosed with Covid 01-10-19.  Got it after a family gathering.   She had a CT scan today due to shortness of breath.   She has ongoing constipation.  Magnesium and prunes help.  Stopped using vaginal estrogen.   She can have vaginal odor on occasion.   Caring for her grandchildren.   PCP: Loura Pardon, MD    Patient's last menstrual period was 01/06/1993.           Sexually active: Yes.    The current method of family planning is status post hysterectomy.    Exercising: No.  The patient does not participate in regular exercise at present.--some walking Smoker:  no  Health Maintenance: Pap:10-18-14 Neg  History of abnormal Pap:  no MMG: 02-07-19 Neg/density B/BiRads1 Colonoscopy: 01-12-13 normal;next 01/2023 BMD: 02-07-19  Result:slightly improved per pt. :osteoporosis--Dr.Deveshwar.  Using Reclast.  TDaP:  03-13-13 Gardasil:   no HIV: no Hep C: no Screening Labs:  PCP and Rheumatology.    reports that she has never smoked. She has never used smokeless tobacco. She reports that she does not drink alcohol or use drugs.  Past Medical History:  Diagnosis Date  . Asthma   . Fibroid   . GERD (gastroesophageal reflux disease)   . Insomnia   . Menopausal symptoms   . Osteoporosis   . Postmenopausal HRT (hormone replacement therapy)   . RA (rheumatoid arthritis) ([place]) 11/2004   methotrexate and Embrel  . Sjogren's disease ([place])   . Sleep apnea 2008   C-Pap  . Vitamin D deficiency     Past Surgical History:  Procedure Laterality Date  . ABDOMINAL HYSTERECTOMY  01/1993   partial with AP repair, secondary to fibroids  . BREAST SURGERY  1991   breast biopsy  . CYSTOCELE REPAIR  10/2007   with graft  . MELANOMA EXCISION Right 01/2012   excision right posterior arm  . NASAL SINUS SURGERY  07/1991  . VAGINAL DELIVERY     x3    Current Outpatient Medications  Medication Sig Dispense Refill  .  acetaminophen (TYLENOL) 500 MG tablet Take 500 mg by mouth every 8 (eight) hours as needed.    Marland Kitchen albuterol (PROAIR HFA) 108 (90 Base) MCG/ACT inhaler Inhale 2 puffs into the lungs every 4 (four) hours as needed for wheezing or shortness of breath. 18 g 3  . aspirin 81 MG tablet Take 81 mg by mouth daily.     . budesonide (PULMICORT) 180 MCG/ACT inhaler Inhale 1 puff into the lungs daily as needed.    . Calcium Carbonate-Vitamin D (CALCIUM 500/D PO) Take 1 tablet by mouth 2 (two) times daily.    . Cholecalciferol (VITAMIN D3) 2000 UNITS TABS Take 1 tablet by mouth daily.      Marland Kitchen desonide (DESOWEN) 0.05 % lotion Apply topically as needed.    . diclofenac sodium (VOLTAREN) 1 % GEL Apply 1 application topically at bedtime as needed.     . famotidine (PEPCID) 40 MG tablet Take 1 tablet (40 mg total) by mouth 2 (two) times daily. 180 tablet 3  . fluticasone (FLONASE) 50 MCG/ACT nasal spray USE 2 SPRAYS IN EACH NOSTRIL DAILY AS NEEDED FOR RHINITIS OR ALLERGIES 48 g 3  . folic acid (FOLVITE) 1 MG tablet TAKE 2 TABLETS DAILY 180 tablet 3  . Ibuprofen 200 MG CAPS Take 3 capsules by mouth as needed.    . Magnesium Malate 1250 (141.7  Mg) MG TABS Take 3 tablets by mouth daily.     . methotrexate 250 MG/10ML injection Inject 250 mg into the muscle once a week.    . Multiple Vitamin (MULTI-VITAMINS) TABS Take 1 tablet by mouth daily.    . Omega-3 1000 MG CAPS Take 1 tablet by mouth daily.    Marland Kitchen ORENCIA 125 MG/ML SOSY INJECT THE CONTENTS OF 1 SYRINGE UNDER THE SKIN EVERY WEEK 4 mL 2  . Tuberculin-Allergy Syringes 27G X 1/2" 1 ML KIT Inject 1 Syringe into the skin once a week. To be used weekly with methotrexate.    . TURMERIC CURCUMIN PO Take by mouth daily.    . zoledronic acid (RECLAST) 5 MG/100ML SOLN injection Inject 5 mg into the vein once. Takes yearly    . zolpidem (AMBIEN) 10 MG tablet Take 0.5 tablets (5 mg total) by mouth at bedtime as needed for sleep. 30 tablet 0   No current facility-administered  medications for this visit.   Facility-Administered Medications Ordered in Other Visits  Medication Dose Route Frequency Provider Last Rate Last Admin  . nitroGLYCERIN (NITROSTAT) 0.4 MG SL tablet             Family History  Problem Relation Age of Onset  . Breast cancer Sister 51  . Thyroid disease Sister   . Cancer Sister 70       Leukemia--CLL  . Heart disease Brother     Review of Systems  All other systems reviewed and are negative.   Exam:   BP 110/68 (Cuff Size: Large)   Pulse (!) 54   Temp (!) 96.9 F (36.1 C) (Temporal)   Resp 12   Ht _0  (1.676 m)   Wt 177 lb 6.4 oz (80.5 kg)   LMP 01/06/1993   BMI 28.63 kg/m     General appearance: alert, cooperative and appears stated age Head: normocephalic, without obvious abnormality, atraumatic Neck: no adenopathy, supple, symmetrical, trachea midline and thyroid normal to inspection and palpation Lungs: clear to auscultation bilaterally Breasts: normal appearance, no masses or tenderness, No nipple retraction or dimpling, No nipple discharge or bleeding, No axillary adenopathy Heart: regular rate and rhythm Abdomen: soft, non-tender; no masses, no organomegaly Extremities: extremities normal, atraumatic, no cyanosis or edema Skin: skin color, texture, turgor normal. No rashes or lesions Lymph nodes: cervical, supraclavicular, and axillary nodes normal. Neurologic: grossly normal  Pelvic: External genitalia:  no lesions              No abnormal inguinal nodes palpated.              Urethra:  normal appearing urethra with no masses, tenderness or lesions              Bartholins and Skenes: normal                 Vagina: normal appearing vagina with normal color and discharge, no lesions              Second degree cystocele and first degree rectocele.               Graft palpable under the vaginal mucosa.   Pale mucosa.              Cervix: absent              Pap taken: No. Bimanual Exam:  Uterus:  absent               Adnexa: no  mass, fullness, tenderness              Rectal exam: Yes.  .  Confirms.              Anus:  normal sphincter tone, no lesions  Chaperone was present for exam.  Assessment:   Well woman visit with normal exam. Status post TAH/A and P repair - fibroid, prolapse.  Vaginal atrophy.  Second degree cystocele and first degree rectocele.  Osteoporosis.On Reclast.Hx prior Actonel use.  GERD. Constipation. Chronic.  Rheumatoid arthritis. Joint pain. Sjogren's.  FH breast cancer - sister. Genetic testing negative. Vaginal odor.   Plan: Mammogram screening discussed. Self breast awareness reviewed. Pap and HR HPV as above. Guidelines for Calcium, Vitamin D, regular exercise program including cardiovascular and weight bearing exercise. No pap needed.   Affirm. She prefers Metrogel if needs tx for BV.  Refill of Vagifem 90 day Rx with 3 refills.  We discussed the benefits to the vaginal mucosa and the importance of yearly mammograms while using this medication.  Follow up annually and prn.   After visit summary provided.

## 2019-02-24 ENCOUNTER — Telehealth: Payer: Self-pay | Admitting: Interventional Cardiology

## 2019-02-24 ENCOUNTER — Ambulatory Visit (HOSPITAL_COMMUNITY)
Admission: RE | Admit: 2019-02-24 | Discharge: 2019-02-24 | Disposition: A | Discharge status: Home / Self Care | Payer: Medicare Other | Source: Ambulatory Visit | Attending: Interventional Cardiology | Admitting: Interventional Cardiology

## 2019-02-24 DIAGNOSIS — R0600 Dyspnea, unspecified: Secondary | ICD-10-CM | POA: Insufficient documentation

## 2019-02-24 DIAGNOSIS — I2510 Atherosclerotic heart disease of native coronary artery without angina pectoris: Secondary | ICD-10-CM | POA: Insufficient documentation

## 2019-02-24 DIAGNOSIS — R0602 Shortness of breath: Secondary | ICD-10-CM | POA: Diagnosis not present

## 2019-02-24 LAB — VAGINITIS/VAGINOSIS, DNA PROBE
Candida Species: NEGATIVE
Gardnerella vaginalis: POSITIVE — AB
Trichomonas vaginosis: NEGATIVE

## 2019-02-24 NOTE — Telephone Encounter (Signed)
Spoke with pt and advised I am waiting for Dr. Tamala Julian to review and send results over to me.  Pt wanted to mention that she recently had an episode of chest pressure that was uncomfortable.  She seen the CT information on Mychart and was very concerned.  Advised I would send to Dr. Tamala Julian to update him.

## 2019-02-24 NOTE — Telephone Encounter (Signed)
New Message    Pt is calling for results of her CT    Please call

## 2019-02-28 ENCOUNTER — Other Ambulatory Visit: Payer: Self-pay

## 2019-02-28 DIAGNOSIS — N760 Acute vaginitis: Secondary | ICD-10-CM

## 2019-02-28 DIAGNOSIS — N898 Other specified noninflammatory disorders of vagina: Secondary | ICD-10-CM

## 2019-02-28 MED ORDER — METRONIDAZOLE 500 MG PO TABS: 500.0000 mg | ORAL_TABLET | Freq: Two times a day (BID) | ORAL | 0 refills | Status: DC

## 2019-02-28 NOTE — Progress Notes (Signed)
Flagyl Rx to pharmacy on file per Dr Elza Rafter lab results on 02/28/2019  Encounter closed.

## 2019-03-28 ENCOUNTER — Other Ambulatory Visit: Payer: Self-pay

## 2019-03-28 ENCOUNTER — Ambulatory Visit: Payer: Self-pay

## 2019-03-28 ENCOUNTER — Ambulatory Visit (INDEPENDENT_AMBULATORY_CARE_PROVIDER_SITE_OTHER): Payer: Medicare Other | Admitting: Physician Assistant

## 2019-03-28 DIAGNOSIS — M170 Bilateral primary osteoarthritis of knee: Secondary | ICD-10-CM | POA: Diagnosis not present

## 2019-03-28 DIAGNOSIS — M25562 Pain in left knee: Secondary | ICD-10-CM

## 2019-03-28 DIAGNOSIS — G8929 Other chronic pain: Secondary | ICD-10-CM | POA: Diagnosis not present

## 2019-03-28 DIAGNOSIS — M25561 Pain in right knee: Secondary | ICD-10-CM

## 2019-03-28 MED ORDER — HYALURONAN 30 MG/2ML IX SOSY
30.0000 mg | PREFILLED_SYRINGE | INTRA_ARTICULAR | Status: AC | PRN
  Administered 2019-03-28: 30 mg via INTRA_ARTICULAR

## 2019-03-28 MED ORDER — LIDOCAINE HCL 1 % IJ SOLN
1.5000 mL | INTRAMUSCULAR | Status: AC | PRN
  Administered 2019-03-28: 1.5 mL

## 2019-03-28 NOTE — Progress Notes (Signed)
   Procedure Note  Patient: Deanna Norris             Date of Birth: [birth year]           MRN: [id number]             Visit Date: 03/28/2019  Procedures: Visit Diagnoses:  1. Primary osteoarthritis of both knees   2. Chronic pain of both knees     Orthovisc #1 bilateral knees, B/B Large Joint Inj: bilateral knee on 03/28/2019 10:35 AM Indications: pain Details: 25 G 1.5 in needle, medial approach  Arthrogram: No  Medications (Right): 1.5 mL lidocaine 1 %; 30 mg Hyaluronan 30 MG/2ML Aspirate (Right): 0 mL Medications (Left): 1.5 mL lidocaine 1 %; 30 mg Hyaluronan 30 MG/2ML Aspirate (Left): 0 mL Outcome: tolerated well, no immediate complications Procedure, treatment alternatives, risks and benefits explained, specific risks discussed. Consent was given by the patient. Immediately prior to procedure a time out was called to verify the correct patient, procedure, equipment, support staff and site/side marked as required. Patient was prepped and draped in the usual sterile fashion.     Patient tolerated the procedure well. Aftercare was discussed. Hazel Sams, PA-C

## 2019-03-29 ENCOUNTER — Encounter: Payer: Self-pay | Admitting: Family Medicine

## 2019-04-04 ENCOUNTER — Ambulatory Visit (INDEPENDENT_AMBULATORY_CARE_PROVIDER_SITE_OTHER): Payer: Medicare Other | Admitting: Physician Assistant

## 2019-04-04 ENCOUNTER — Other Ambulatory Visit: Payer: Self-pay

## 2019-04-04 ENCOUNTER — Other Ambulatory Visit: Payer: Self-pay | Admitting: Family Medicine

## 2019-04-04 ENCOUNTER — Telehealth: Payer: Self-pay | Admitting: Rheumatology

## 2019-04-04 DIAGNOSIS — M25561 Pain in right knee: Secondary | ICD-10-CM

## 2019-04-04 DIAGNOSIS — M170 Bilateral primary osteoarthritis of knee: Secondary | ICD-10-CM | POA: Diagnosis not present

## 2019-04-04 DIAGNOSIS — M25562 Pain in left knee: Secondary | ICD-10-CM | POA: Diagnosis not present

## 2019-04-04 DIAGNOSIS — G8929 Other chronic pain: Secondary | ICD-10-CM

## 2019-04-04 DIAGNOSIS — Z79899 Other long term (current) drug therapy: Secondary | ICD-10-CM

## 2019-04-04 MED ORDER — LIDOCAINE HCL 1 % IJ SOLN
1.5000 mL | INTRAMUSCULAR | Status: AC | PRN
  Administered 2019-04-04: 1.5 mL

## 2019-04-04 MED ORDER — HYALURONAN 30 MG/2ML IX SOSY
30.0000 mg | PREFILLED_SYRINGE | INTRA_ARTICULAR | Status: AC | PRN
  Administered 2019-04-04: 30 mg via INTRA_ARTICULAR

## 2019-04-04 NOTE — Telephone Encounter (Signed)
I called and spoke with the patient about the symptoms she was experiencing.  She denies any joint swelling, warmth, redness.  She states that the discomfort has improved.  She describes the discomfort as an aching sensation for several days after the injection.  She denies any fevers or chills.  She is able to bear full weight without difficulty.  We will proceed with the second Orthovisc injection today.

## 2019-04-04 NOTE — Telephone Encounter (Signed)
Patient called stating she had Orthovisc injections in her knees last Monday, 03/28/19 and her knees were achy until Friday, 04/01/19.  Patient states she is scheduled for her second Orthovisc injections this afternoon and requesting a return call to let her know if it is still okay to keep appointment this afternoon.

## 2019-04-04 NOTE — Progress Notes (Signed)
   Procedure Note  Patient: Deanna Norris             Date of Birth: July 22, 1945           MRN: [id number]             Visit Date: 04/04/2019  Procedures: Visit Diagnoses:  1. High risk medication use   2. Chronic pain of both knees   3. Primary osteoarthritis of both knees     orthovisc #2 bilateral B/B Large Joint Inj: bilateral knee on 04/04/2019 10:46 AM Indications: pain Details: 25 G 1.5 in needle, medial approach  Arthrogram: No  Medications (Right): 1.5 mL lidocaine 1 %; 30 mg Hyaluronan 30 MG/2ML Aspirate (Right): 0 mL Medications (Left): 1.5 mL lidocaine 1 %; 30 mg Hyaluronan 30 MG/2ML Aspirate (Left): 0 mL Outcome: tolerated well, no immediate complications Procedure, treatment alternatives, risks and benefits explained, specific risks discussed. Consent was given by the patient. Immediately prior to procedure a time out was called to verify the correct patient, procedure, equipment, support staff and site/side marked as required. Patient was prepped and draped in the usual sterile fashion.     Patient tolerated the procedure well. Aftercare was discussed.  Hazel Sams, PA-C

## 2019-04-11 ENCOUNTER — Ambulatory Visit (INDEPENDENT_AMBULATORY_CARE_PROVIDER_SITE_OTHER): Payer: Medicare Other | Admitting: Physician Assistant

## 2019-04-11 ENCOUNTER — Other Ambulatory Visit: Payer: Self-pay | Admitting: *Deleted

## 2019-04-11 ENCOUNTER — Other Ambulatory Visit: Payer: Self-pay

## 2019-04-11 DIAGNOSIS — Z79899 Other long term (current) drug therapy: Secondary | ICD-10-CM

## 2019-04-11 DIAGNOSIS — M170 Bilateral primary osteoarthritis of knee: Secondary | ICD-10-CM | POA: Diagnosis not present

## 2019-04-11 MED ORDER — HYALURONAN 30 MG/2ML IX SOSY
30.0000 mg | PREFILLED_SYRINGE | INTRA_ARTICULAR | Status: AC | PRN
  Administered 2019-04-11: 30 mg via INTRA_ARTICULAR

## 2019-04-11 MED ORDER — LIDOCAINE HCL 1 % IJ SOLN
1.5000 mL | INTRAMUSCULAR | Status: AC | PRN
  Administered 2019-04-11: 1.5 mL

## 2019-04-11 NOTE — Progress Notes (Signed)
   Procedure Note  Patient: Deanna Norris             Date of Birth: [birth year]           MRN: [id number]             Visit Date: 04/11/2019  Procedures: Visit Diagnoses:  1. Primary osteoarthritis of both knees   2. High risk medication use     Orthovisc #3 bilateral Large Joint Inj: bilateral knee on 04/11/2019 11:26 AM Indications: pain Details: 25 G 1.5 in needle, medial approach  Arthrogram: No  Medications (Right): 1.5 mL lidocaine 1 %; 30 mg Hyaluronan 30 MG/2ML Aspirate (Right): 0 mL Medications (Left): 1.5 mL lidocaine 1 %; 30 mg Hyaluronan 30 MG/2ML Aspirate (Left): 0 mL Outcome: tolerated well, no immediate complications Consent was given by the patient. Immediately prior to procedure a time out was called to verify the correct patient, procedure, equipment, support staff and site/side marked as required. Patient was prepped and draped in the usual sterile fashion.     Patient tolerated the procedure well.  Aftercare was discussed.  Hazel Sams, PA-C

## 2019-04-11 NOTE — Progress Notes (Signed)
Office Visit Note  Patient: Deanna Norris             Date of Birth: 02-08-1945           MRN: [id number]             PCP: Abner Greenspan, MD Referring: Tower, [name], MD Visit Date: 04/22/2019 Occupation: @GUAROCC @  Subjective:  Medication monitoring   History of Present Illness: Deanna Norris is a 74 y.o. female with history of seropositive rheumatoid arthritis, osteoarthritis, and osteoporosis.  Patient is on Orencia 125 mg subcutaneous injections every week, methotrexate 0.8 mL once weekly, and folic acid 2 mg by mouth daily.  She has not missed any doses of methotrexate Orencia as recently.  She denies any recent rheumatoid arthritis flares.  She states that she is notices significant improvement in her knee joint pain and stiffness since having Visco gel injections at the end of March-early April 2021.  She denies any joint swelling at this time.  She experiences intermittent discomfort in both hands but has not noticed any joint swelling.  She uses Voltaren gel topically as needed.  She continues to have chronic fatigue secondary to insomnia.  She takes Ambien 10 mg half tablet by mouth at bedtime as needed for insomnia.  Patient reports she continues to have chronic sicca symptoms which have been tolerable.  She is also had shortness of breath which she attributes to underlying asthma.  She is been evaluated by Dr. Vaughan Browner and had a chest CT on 08/04/2018. Patient reports that she has not received the COVID-19 vaccinations yet.  She states that she was diagnosed with Covid at the end of December 2020 but is considering signing up to receive the vaccinations. Patient reports that she continues to take calcium and vitamin D as recommended.  She has not had any recent falls or fractures.  She is aware that she is due for Reclast IV infusion in July 2021.     Activities of Daily Living:  Patient reports morning stiffness for 5 minutes.   Patient Reports nocturnal pain.    Difficulty dressing/grooming: Denies Difficulty climbing stairs: Reports Difficulty getting out of chair: Reports Difficulty using hands for taps, buttons, cutlery, and/or writing: Denies  Review of Systems  Constitutional: Positive for fatigue.  HENT: Positive for mouth dryness. Negative for mouth sores and nose dryness.   Eyes: Positive for dryness. Negative for pain and visual disturbance.  Respiratory: Negative for cough, hemoptysis and difficulty breathing.   Cardiovascular: Negative for chest pain, palpitations, hypertension and swelling in legs/feet.  Gastrointestinal: Positive for constipation. Negative for blood in stool and diarrhea.  Endocrine: Negative for increased urination.  Genitourinary: Negative for difficulty urinating and painful urination.  Musculoskeletal: Positive for arthralgias, joint pain, joint swelling, morning stiffness and muscle tenderness. Negative for myalgias, muscle weakness and myalgias.  Skin: Positive for rash. Negative for color change, pallor, hair loss, nodules/bumps, skin tightness, ulcers and sensitivity to sunlight.  Allergic/Immunologic: Negative for susceptible to infections.  Neurological: Negative for dizziness, numbness and headaches.  Hematological: Negative for bruising/bleeding tendency and swollen glands.  Psychiatric/Behavioral: Positive for sleep disturbance. Negative for depressed mood. The patient is not nervous/anxious.     PMFS History:  Patient Active Problem List   Diagnosis Date Noted  . Viral URI with cough 01/06/2019  . Exposure to COVID-19 virus 01/06/2019  . Allergic rhinitis 04/08/2018  . Fatigue 02/17/2018  . Immunocompromised ([place]) 02/17/2018  . 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 Primary osteoarthritis of both hands 01/11/2016  . 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/11/2016  . High risk  medication use 11/15/2015  . Sjogren's syndrome ([place]) 10/31/2015  . Chronic constipation 10/23/2015  . Rosacea 10/23/2015  . Rheumatoid arthritis ([place]) 03/29/2015  . Coronary artery calcification seen on CT scan 11/17/2013  . Recurrent sinusitis 09/16/2013  . Encounter for Medicare annual wellness exam 01/31/2013  . Dyspnea on exertion 09/14/2012  . Nodule of right lung 09/14/2012  . Hyperlipidemia 05/08/2009  . 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 Asthma 05/08/2009  . GERD 05/08/2009  . MENOPAUSAL DISORDER 05/08/2009  . ARTHRITIS, RHEUMATOID 05/08/2009  . Osteoporosis 05/08/2009  . SPONDYLOLISTHESIS 05/08/2009  . Sleep apnea 05/08/2009    Past Medical History:  Diagnosis Date  . Asthma   . Constipation   . Fibroid   . GERD (gastroesophageal reflux disease)   . Insomnia   . Lumbar vertebral fracture (HCC)    L5-S1  . Menopausal symptoms   . Osteoporosis   . Postmenopausal HRT (hormone replacement therapy)   . RA (rheumatoid arthritis) ([place]) 11/2004   methotrexate and Embrel  . Rosacea   . Sjogren's disease ([place])   . Sleep apnea 2008   C-Pap  . Vitamin D deficiency     Family History  Problem Relation Age of Onset  . Breast cancer Sister 38  . Thyroid disease Sister   . Cancer Sister 5       Leukemia--CLL  . Heart disease Brother    Past Surgical History:  Procedure Laterality Date  . ABDOMINAL HYSTERECTOMY  01/1993   partial with AP repair, secondary to fibroids  . BREAST SURGERY  1991   breast biopsy  . CYSTOCELE REPAIR  10/2007   with graft  . MELANOMA EXCISION Right 01/2012   excision right posterior arm  . NASAL SINUS SURGERY  07/1991  . VAGINAL DELIVERY     x3   Social History   Social History Narrative  . Not on file   Immunization History  Administered Date(s) Administered  . DTaP 04/06/2013  . Influenza Split 10/22/2010  . Influenza Whole 10/10/2009, 10/20/2011  . Influenza, High Dose Seasonal PF 10/14/2016, 10/08/2018  . Influenza, Seasonal,  Injecte, Preservative Fre 10/06/2012  . Influenza,inj,Quad PF,6+ Mos 10/30/2014  . Influenza,inj,quad, With Preservative 10/12/2017  . Influenza-Unspecified 10/26/2013, 10/22/2015  . Pneumococcal Conjugate-13 02/13/2014  . Pneumococcal Polysaccharide-23 06/12/2010, 03/28/2015  . Td 02/07/2003, 03/23/2013  . Tdap 03/23/2013  . Zoster 05/08/2014     Objective: Vital Signs: BP 129/63 (BP Location: Left Arm, Patient Position: Sitting, Cuff Size: Normal)   Pulse 71   Resp 18   Ht 5\' 6"  (1.676 m)   Wt 181 lb 6.4 oz (82.3 kg)   LMP 01/06/1993   BMI 29.28 kg/m    Physical Exam Vitals and nursing note reviewed.  Constitutional:      Appearance: She is well-developed.  HENT:     Head: Normocephalic and atraumatic.  Eyes:     Conjunctiva/sclera: Conjunctivae normal.  Pulmonary:     Effort: Pulmonary effort is normal.  Abdominal:     General: Bowel sounds are normal.     Palpations: Abdomen is soft.  Musculoskeletal:     Cervical back: Normal range of motion.  Lymphadenopathy:     Cervical: No cervical adenopathy.  Skin:    General: Skin is warm and dry.  Capillary Refill: Capillary refill takes less than 2 seconds.  Neurological:     Mental Status: She is alert and oriented to person, place, and time.  Psychiatric:        Behavior: Behavior normal.      Musculoskeletal Exam: C-spine, thoracic spine, lumbar spine good range of motion.  No midline spinal tenderness.  No SI joint tenderness.  Shoulder joints, elbow joints, wrist joints, MCPs, PIPs and DIPs good range of motion with no synovitis.  She has PIP and DIP thickening consistent with osteoarthritis of both hands.  She has complete fist formation bilaterally.  Ulnar deviation of left hand noted.  Hip joints have good range of motion with no discomfort.  Knee joints have good range of motion with no warmth or effusion.  Ankle joints have good range of motion no tenderness or synovitis.  No tenderness of MTP joints.  CDAI  Exam: CDAI Score: 0.6  Patient Global: 3 mm; Provider Global: 3 mm Swollen: 0 ; Tender: 0  Joint Exam 04/22/2019   No joint exam has been documented for this visit   There is currently no information documented on the homunculus. Go to the Rheumatology activity and complete the homunculus joint exam.  Investigation: No additional findings.  Imaging: XR KNEE 3 VIEW LEFT  Result Date: 03/28/2019 Moderate to severe medial compartment narrowing was noted.  Intercondylar osteophytes were noted.  Severe patellofemoral narrowing was noted.  Calcification in the popliteal region was noted. Impression: These findings are consistent with moderate to severe osteoarthritis and severe chondromalacia patella.  XR KNEE 3 VIEW RIGHT  Result Date: 03/28/2019 Moderate to severe medial compartment narrowing was noted.  Severe patellofemoral narrowing was noted.  Bony islands were noted in the tibia consistent with bone infarcts. Impression: Moderate to severe osteoarthritis and severe chondromalacia patella.  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  No comparison films were available.   Recent Labs: Lab Results  Component Value Date   WBC 8.2 04/11/2019   HGB 15.3 04/11/2019   PLT 260 04/11/2019   NA 141 04/11/2019   K 4.6 04/11/2019   CL 105 04/11/2019   CO2 28 04/11/2019   GLUCOSE 89 04/11/2019   BUN 11 04/11/2019   CREATININE 0.72 04/11/2019   BILITOT 0.4 04/11/2019   ALKPHOS 67 06/09/2018   AST 21 04/11/2019   ALT 22 04/11/2019   PROT 6.1 04/11/2019   ALBUMIN 3.7 06/09/2018   CALCIUM 9.3 04/11/2019   GFRAA 96 04/11/2019   QFTBGOLDPLUS NEGATIVE 04/11/2019    Speciality Comments: Prior therapy: Plaquenil (allergy), Humira and Enbrel (inadequate response)  Procedures:  No procedures performed Allergies: Adalimumab, Diclofenac, Dicyclomine, Doxycycline hyclate, Enbrel [etanercept], Fosamax [alendronate sodium], Methotrexate derivatives, Nickel, Piroxicam, Salagen [pilocarpine hcl],  Sulfamethoxazole-trimethoprim, Tramadol hcl, and Plaquenil [hydroxychloroquine]   Assessment / Plan:     Visit Diagnoses: Rheumatoid arthritis involving multiple sites with positive rheumatoid factor (HCC) - +RF, +CCP, ANA-,severe erosive: She has no synovitis on exam.  She has not had any recent rheumatoid arthritis flares.  She is clinically doing well on Orencia 125 mg subcutaneous injections once weekly, methotrexate 0.8 mL once weekly, and folic acid 2 mg by mouth daily.  She has not missed any doses of Orencia or methotrexate recently.  She is not experiencing any joint pain or inflammation at this time.  She will continue on Orencia and methotrexate as prescribed.  She needs refills of Orencia, methotrexate, and folic acid today.  She was advised to notify us if she  develops increased joint pain or joint swelling.  She will follow-up in the office in 5 months.  High risk medication use - Orencia 125 mg every 7 days, methotrexate 0.8 ml sq every 7 days, and folic acid 1 mg 2 tablets daily.  CBC and CMP were drawn on 04/11/2019.  Lab work was reviewed with the patient today in the office.  She will be due to update lab work in July and every 3 months.  Standing orders for CBC and CMP are in place.  TB gold was negative on 04/11/2019 and will be monitored yearly. She was diagnosed with COVID-19 in December 2020.  She is planning on receiving the COVID-19 vaccination.  We discussed holding Orencia 1 week prior to and 1 week after the the first vaccine dose.  No interruptions in therapy recommended for the second vaccine. She was also advised to hold methotrexate 1 week after receiving both vaccinations.  I wrote down these instructions for the patient and she voiced understanding. She is aware that she is to hold Orencia and methotrexate if she develops any signs or symptoms of an infection and to resume once the infection has completely cleared.  Sicca syndrome (HCC) - She could not tolerate taking  pilocarpine in the past.  She continues to have chronic sicca symptoms which have been tolerable recently.  Primary osteoarthritis of both hands: She has PIP and DIP thickening consistent with osteoarthritis of both hands.  No tenderness or synovitis was noted.  She has complete fist formation bilaterally.  Joint protection and muscle strengthening were discussed.    Primary osteoarthritis of both knees: She has good range of motion of both knee joints on exam.  No warmth or effusion was noted.She has noticed a significant improvement in her knee joint pain since having Visco gel injections at the end of March to early April 2021.  Primary osteoarthritis of both feet - She had x-rays of both feet on 02/02/18, which were consistent with rheumatoid arthritis and osteoarthritis overlap.  She has no discomfort in her feet at this time.  She wears proper fitting shoes.  Spondylosis of lumbar region without myelopathy or radiculopathy: She experiences intermittent discomfort in her lower back.  She is not having any symptoms of radiculopathy at this time.  DDD (degenerative disc disease), lumbar: She experiences intermittent discomfort in her lower back.  No midline spinal tenderness.  She is not having any symptoms of radiculopathy.  Age-related osteoporosis without current pathological fracture - DEXA 02/07/2019 Lowest T-score and site measured: Left Femoral Neck -2.9.  DEXA results were reviewed today in the office.  She will be due for her next Reclast IV infusion in July 2021.  She was encouraged to continue to take vitamin D and calcium as recommended.  She has not had any recent falls or fractures.  Shortness of breath - She had a high resolution CT on 08/04/18 which revealed minimal ground glass opacities.  There was no evidence of interstitial lung disease.  She was evaluated by Dr. Vaughan Browner.  According to the patient she was diagnosed with mild asthma and continues to have intermittent shortness of  breath.  She has not developed any new or worsening symptoms.  Other medical conditions are listed as follows:  History of rosacea  History of gastroesophageal reflux (GERD)  History of hyperlipidemia  History of sleep apnea  Abnormal SPEP  Orders: No orders of the defined types were placed in this encounter.  Meds ordered this encounter  Medications  .  zolpidem (AMBIEN) 10 MG tablet    Sig: Take 0.5 tablets (5 mg total) by mouth at bedtime as needed for sleep.    Dispense:  30 tablet    Refill:  0    Face-to-face time spent with patient was 30 minutes. Greater than 50% of time was spent in counseling and coordination of care.  Follow-Up Instructions: Return in about 5 months (around 09/22/2019) for Rheumatoid arthritis, Osteoporosis, Osteoarthritis.   Ofilia Neas, PA-C  Note - This record has been created using Dragon software.  Chart creation errors have been sought, but may not always  have been located. Such creation errors do not reflect on  the standard of medical care.

## 2019-04-12 NOTE — Progress Notes (Signed)
CMP WNL. CBC stable.

## 2019-04-13 LAB — CBC WITH DIFFERENTIAL/PLATELET
Absolute Monocytes: 951 cells/uL — ABNORMAL HIGH (ref 200–950)
Basophils Absolute: 98 cells/uL (ref 0–200)
Basophils Relative: 1.2 %
Eosinophils Absolute: 631 cells/uL — ABNORMAL HIGH (ref 15–500)
Eosinophils Relative: 7.7 %
HCT: 46.8 % — ABNORMAL HIGH (ref 35.0–45.0)
Hemoglobin: 15.3 g/dL (ref 11.7–15.5)
Lymphs Abs: 2198 cells/uL (ref 850–3900)
MCH: 32.3 pg (ref 27.0–33.0)
MCHC: 32.7 g/dL (ref 32.0–36.0)
MCV: 98.7 fL (ref 80.0–100.0)
MPV: 10.6 fL (ref 7.5–12.5)
Monocytes Relative: 11.6 %
Neutro Abs: 4321 cells/uL (ref 1500–7800)
Neutrophils Relative %: 52.7 %
Platelets: 260 10*3/uL (ref 140–400)
RBC: 4.74 10*6/uL (ref 3.80–5.10)
RDW: 14.2 % (ref 11.0–15.0)
Total Lymphocyte: 26.8 %
WBC: 8.2 10*3/uL (ref 3.8–10.8)

## 2019-04-13 LAB — COMPLETE METABOLIC PANEL WITH GFR
AG Ratio: 1.7 (calc) (ref 1.0–2.5)
ALT: 22 U/L (ref 6–29)
AST: 21 U/L (ref 10–35)
Albumin: 3.8 g/dL (ref 3.6–5.1)
Alkaline phosphatase (APISO): 75 U/L (ref 37–153)
BUN: 11 mg/dL (ref 7–25)
CO2: 28 mmol/L (ref 20–32)
Calcium: 9.3 mg/dL (ref 8.6–10.4)
Chloride: 105 mmol/L (ref 98–110)
Creat: 0.72 mg/dL (ref 0.60–0.93)
GFR, Est African American: 96 mL/min/{1.73_m2} (ref >=60)
GFR, Est Non African American: 83 mL/min/{1.73_m2} (ref >=60)
Globulin: 2.3 g/dL (calc) (ref 1.9–3.7)
Glucose, Bld: 89 mg/dL (ref 65–99)
Potassium: 4.6 mmol/L (ref 3.5–5.3)
Sodium: 141 mmol/L (ref 135–146)
Total Bilirubin: 0.4 mg/dL (ref 0.2–1.2)
Total Protein: 6.1 g/dL (ref 6.1–8.1)

## 2019-04-13 LAB — QUANTIFERON-TB GOLD PLUS
Mitogen-NIL: >10 IU/mL
NIL: 0.02 IU/mL
QuantiFERON-TB Gold Plus: NEGATIVE
TB1-NIL: 0.01 IU/mL
TB2-NIL: 0.01 IU/mL

## 2019-04-13 NOTE — Progress Notes (Signed)
TB gold negative

## 2019-04-22 ENCOUNTER — Ambulatory Visit (INDEPENDENT_AMBULATORY_CARE_PROVIDER_SITE_OTHER): Payer: Medicare Other | Admitting: Physician Assistant

## 2019-04-22 ENCOUNTER — Encounter: Payer: Self-pay | Admitting: Physician Assistant

## 2019-04-22 ENCOUNTER — Other Ambulatory Visit: Payer: Self-pay

## 2019-04-22 ENCOUNTER — Telehealth: Payer: Self-pay

## 2019-04-22 VITALS — BP 129/63 | HR 71 | Resp 18 | Ht 66.0 in | Wt 181.4 lb

## 2019-04-22 DIAGNOSIS — M170 Bilateral primary osteoarthritis of knee: Secondary | ICD-10-CM | POA: Diagnosis not present

## 2019-04-22 DIAGNOSIS — M810 Age-related osteoporosis without current pathological fracture: Secondary | ICD-10-CM

## 2019-04-22 DIAGNOSIS — M19042 Primary osteoarthritis, left hand: Secondary | ICD-10-CM

## 2019-04-22 DIAGNOSIS — R0602 Shortness of breath: Secondary | ICD-10-CM | POA: Diagnosis not present

## 2019-04-22 DIAGNOSIS — M19041 Primary osteoarthritis, right hand: Secondary | ICD-10-CM

## 2019-04-22 DIAGNOSIS — M3500 Sicca syndrome, unspecified: Secondary | ICD-10-CM

## 2019-04-22 DIAGNOSIS — M47816 Spondylosis without myelopathy or radiculopathy, lumbar region: Secondary | ICD-10-CM

## 2019-04-22 DIAGNOSIS — M19071 Primary osteoarthritis, right ankle and foot: Secondary | ICD-10-CM

## 2019-04-22 DIAGNOSIS — Z8719 Personal history of other diseases of the digestive system: Secondary | ICD-10-CM | POA: Diagnosis not present

## 2019-04-22 DIAGNOSIS — M0579 Rheumatoid arthritis with rheumatoid factor of multiple sites without organ or systems involvement: Secondary | ICD-10-CM | POA: Diagnosis not present

## 2019-04-22 DIAGNOSIS — Z8669 Personal history of other diseases of the nervous system and sense organs: Secondary | ICD-10-CM

## 2019-04-22 DIAGNOSIS — Z8639 Personal history of other endocrine, nutritional and metabolic disease: Secondary | ICD-10-CM

## 2019-04-22 DIAGNOSIS — Z872 Personal history of diseases of the skin and subcutaneous tissue: Secondary | ICD-10-CM | POA: Diagnosis not present

## 2019-04-22 DIAGNOSIS — Z79899 Other long term (current) drug therapy: Secondary | ICD-10-CM

## 2019-04-22 DIAGNOSIS — M5136 Other intervertebral disc degeneration, lumbar region: Secondary | ICD-10-CM

## 2019-04-22 DIAGNOSIS — R778 Other specified abnormalities of plasma proteins: Secondary | ICD-10-CM

## 2019-04-22 DIAGNOSIS — Z5181 Encounter for therapeutic drug level monitoring: Secondary | ICD-10-CM

## 2019-04-22 DIAGNOSIS — M19072 Primary osteoarthritis, left ankle and foot: Secondary | ICD-10-CM

## 2019-04-22 MED ORDER — ZOLPIDEM TARTRATE 10 MG PO TABS: 5.0000 mg | ORAL_TABLET | Freq: Every evening | ORAL | 0 refills | Status: DC | PRN

## 2019-04-22 MED ORDER — FOLIC ACID 1 MG PO TABS: 2.0000 mg | ORAL_TABLET | Freq: Every day | ORAL | 3 refills | Status: DC

## 2019-04-22 MED ORDER — ORENCIA 125 MG/ML ~~LOC~~ SOSY: PREFILLED_SYRINGE | SUBCUTANEOUS | 2 refills | Status: DC

## 2019-04-22 MED ORDER — METHOTREXATE SODIUM CHEMO INJECTION 50 MG/2ML: INTRAMUSCULAR | 0 refills | Status: DC

## 2019-04-22 MED ORDER — DICLOFENAC SODIUM 1 % EX GEL: CUTANEOUS | 2 refills | Status: AC

## 2019-04-22 NOTE — Patient Instructions (Signed)
Standing Labs We placed an order today for your standing lab work.    Please come back and get your standing labs in July and every 3 months   We have open lab daily Monday through Thursday from 8:30-12:30 PM and 1:30-4:30 PM and Friday from 8:30-12:30 PM and 1:30-4:00 PM at the office of Dr. Shaili Deveshwar.   You may experience shorter wait times on Monday and Friday afternoons. The office is located at [address], [address], [place], [place] [address] No appointment is necessary.   Labs are drawn by [facility].  You may receive a bill from [facility] for your lab work.  If you wish to have your labs drawn at another location, please call the office 24 hours in advance to send orders.  If you have any questions regarding directions or hours of operation,  please call [phone].   Just as a reminder please drink plenty of water prior to coming for your lab work. Thanks!   

## 2019-04-22 NOTE — Addendum Note (Signed)
Addended by: Earnestine Mealing on: 04/22/2019 03:06 PM   Modules accepted: Orders

## 2019-04-22 NOTE — Telephone Encounter (Signed)
Patient on follow up list and will coordinate labs and administration in July.   Mariella Saa, PharmD, [place], [place] [facility] Pharmacist [phone]  04/22/2019 3:10 PM

## 2019-04-22 NOTE — Telephone Encounter (Signed)
Patient will be due for IV reclast in July 2021, per Hazel Sams, PA-C. Thanks!

## 2019-04-28 DIAGNOSIS — Z03818 Encounter for observation for suspected exposure to other biological agents ruled out: Secondary | ICD-10-CM | POA: Diagnosis not present

## 2019-05-10 DIAGNOSIS — H11443 Conjunctival cysts, bilateral: Secondary | ICD-10-CM | POA: Diagnosis not present

## 2019-06-15 ENCOUNTER — Telehealth: Payer: Self-pay | Admitting: Family Medicine

## 2019-06-15 DIAGNOSIS — Z79899 Other long term (current) drug therapy: Secondary | ICD-10-CM

## 2019-06-15 DIAGNOSIS — D472 Monoclonal gammopathy: Secondary | ICD-10-CM

## 2019-06-15 DIAGNOSIS — H02889 Meibomian gland dysfunction of unspecified eye, unspecified eyelid: Secondary | ICD-10-CM | POA: Diagnosis not present

## 2019-06-15 DIAGNOSIS — M810 Age-related osteoporosis without current pathological fracture: Secondary | ICD-10-CM

## 2019-06-15 DIAGNOSIS — E7800 Pure hypercholesterolemia, unspecified: Secondary | ICD-10-CM

## 2019-06-15 DIAGNOSIS — D849 Immunodeficiency, unspecified: Secondary | ICD-10-CM

## 2019-06-15 DIAGNOSIS — R5382 Chronic fatigue, unspecified: Secondary | ICD-10-CM

## 2019-06-15 NOTE — Telephone Encounter (Signed)
-----   Message from Ellamae Sia sent at 06/01/2019 12:13 PM EDT ----- Regarding: Lab orders for Thursday, 6.10.21 Patient is scheduled for CPX labs, please order future labs, Thanks , Karna Christmas

## 2019-06-16 ENCOUNTER — Other Ambulatory Visit (INDEPENDENT_AMBULATORY_CARE_PROVIDER_SITE_OTHER): Payer: Medicare Other

## 2019-06-16 ENCOUNTER — Ambulatory Visit: Payer: Medicare Other

## 2019-06-16 ENCOUNTER — Other Ambulatory Visit: Payer: Self-pay

## 2019-06-16 ENCOUNTER — Ambulatory Visit (INDEPENDENT_AMBULATORY_CARE_PROVIDER_SITE_OTHER): Payer: Medicare Other

## 2019-06-16 DIAGNOSIS — D472 Monoclonal gammopathy: Secondary | ICD-10-CM

## 2019-06-16 DIAGNOSIS — R5382 Chronic fatigue, unspecified: Secondary | ICD-10-CM

## 2019-06-16 DIAGNOSIS — E7800 Pure hypercholesterolemia, unspecified: Secondary | ICD-10-CM | POA: Diagnosis not present

## 2019-06-16 DIAGNOSIS — Z0000 Encounter for general adult medical examination without abnormal findings: Secondary | ICD-10-CM

## 2019-06-16 DIAGNOSIS — D849 Immunodeficiency, unspecified: Secondary | ICD-10-CM | POA: Diagnosis not present

## 2019-06-16 DIAGNOSIS — Z79899 Other long term (current) drug therapy: Secondary | ICD-10-CM

## 2019-06-16 DIAGNOSIS — M810 Age-related osteoporosis without current pathological fracture: Secondary | ICD-10-CM

## 2019-06-16 LAB — CBC WITH DIFFERENTIAL/PLATELET
Basophils Absolute: 0.1 10*3/uL (ref 0.0–0.1)
Basophils Relative: 1.3 % (ref 0.0–3.0)
Eosinophils Absolute: 0.8 10*3/uL — ABNORMAL HIGH (ref 0.0–0.7)
Eosinophils Relative: 12.1 % — ABNORMAL HIGH (ref 0.0–5.0)
HCT: 44.9 % (ref 36.0–46.0)
Hemoglobin: 15.1 g/dL — ABNORMAL HIGH (ref 12.0–15.0)
Lymphocytes Relative: 26.9 % (ref 12.0–46.0)
Lymphs Abs: 1.9 10*3/uL (ref 0.7–4.0)
MCHC: 33.6 g/dL (ref 30.0–36.0)
MCV: 97.6 fl (ref 78.0–100.0)
Monocytes Absolute: 0.7 10*3/uL (ref 0.1–1.0)
Monocytes Relative: 10.4 % (ref 3.0–12.0)
Neutro Abs: 3.4 10*3/uL (ref 1.4–7.7)
Neutrophils Relative %: 49.3 % (ref 43.0–77.0)
Platelets: 250 10*3/uL (ref 150.0–400.0)
RBC: 4.6 Mil/uL (ref 3.87–5.11)
RDW: 14.6 % (ref 11.5–15.5)
WBC: 7 10*3/uL (ref 4.0–10.5)

## 2019-06-16 LAB — COMPREHENSIVE METABOLIC PANEL
ALT: 28 U/L (ref 0–35)
AST: 30 U/L (ref 0–37)
Albumin: 3.9 g/dL (ref 3.5–5.2)
Alkaline Phosphatase: 68 U/L (ref 39–117)
BUN: 18 mg/dL (ref 6–23)
CO2: 28 mEq/L (ref 19–32)
Calcium: 9 mg/dL (ref 8.4–10.5)
Chloride: 103 mEq/L (ref 96–112)
Creatinine, Ser: 0.65 mg/dL (ref 0.40–1.20)
GFR: 89.19 mL/min (ref >60.00)
Glucose, Bld: 91 mg/dL (ref 70–99)
Potassium: 4.1 mEq/L (ref 3.5–5.1)
Sodium: 138 mEq/L (ref 135–145)
Total Bilirubin: 0.6 mg/dL (ref 0.2–1.2)
Total Protein: 6.5 g/dL (ref 6.0–8.3)

## 2019-06-16 LAB — LIPID PANEL
Cholesterol: 216 mg/dL — ABNORMAL HIGH (ref 0–200)
HDL: 59.5 mg/dL (ref >39.00)
LDL Cholesterol: 141 mg/dL — ABNORMAL HIGH (ref 0–99)
NonHDL: 156.94
Total CHOL/HDL Ratio: 4
Triglycerides: 78 mg/dL (ref 0.0–149.0)
VLDL: 15.6 mg/dL (ref 0.0–40.0)

## 2019-06-16 LAB — TSH: TSH: 2.32 u[IU]/mL (ref 0.35–4.50)

## 2019-06-16 LAB — VITAMIN D 25 HYDROXY (VIT D DEFICIENCY, FRACTURES): VITD: 71.09 ng/mL (ref 30.00–100.00)

## 2019-06-16 NOTE — Patient Instructions (Signed)
Deanna Norris , Thank you for taking time to come for your Medicare Wellness Visit. I appreciate your ongoing commitment to your health goals. Please review the following plan we discussed and let me know if I can assist you in the future.   Screening recommendations/referrals: Colonoscopy: Up to date, completed 01/12/2013 Mammogram: Up to date, completed 02/07/2019 Bone Density: Up to date, completed 02/07/2019 Recommended yearly ophthalmology/optometry visit for glaucoma screening and checkup Recommended yearly dental visit for hygiene and checkup  Vaccinations: Influenza vaccine: Up to date, completed 10/08/2018 Pneumococcal vaccine: Completed series Tdap vaccine: Up to date, completed 03/23/2013 Shingles vaccine: discussed    Advanced directives: Please bring a copy of your POA (Power of [place]) and/or Living Will to your next appointment.   Conditions/risks identified: hyperlipidemia  Next appointment: 06/22/2019 @ 9:30 am    Preventive Care 65 Years and Older, Female Preventive care refers to lifestyle choices and visits with your health care provider that can promote health and wellness. What does preventive care include?  A yearly physical exam. This is also called an annual well check.  Dental exams once or twice a year.  Routine eye exams. Ask your health care provider how often you should have your eyes checked.  Personal lifestyle choices, including:  Daily care of your teeth and gums.  Regular physical activity.  Eating a healthy diet.  Avoiding tobacco and drug use.  Limiting alcohol use.  Practicing safe sex.  Taking low-dose aspirin every day.  Taking vitamin and mineral supplements as recommended by your health care provider. What happens during an annual well check? The services and screenings done by your health care provider during your annual well check will depend on your age, overall health, lifestyle risk factors, and family history of  disease. Counseling  Your health care provider may ask you questions about your:  Alcohol use.  Tobacco use.  Drug use.  Emotional well-being.  Home and relationship well-being.  Sexual activity.  Eating habits.  History of falls.  Memory and ability to understand (cognition).  Work and work Statistician.  Reproductive health. Screening  You may have the following tests or measurements:  Height, weight, and BMI.  Blood pressure.  Lipid and cholesterol levels. These may be checked every 5 years, or more frequently if you are over 55 years old.  Skin check.  Lung cancer screening. You may have this screening every year starting at age 62 if you have a 30-pack-year history of smoking and currently smoke or have quit within the past 15 years.  Fecal occult blood test (FOBT) of the stool. You may have this test every year starting at age 35.  Flexible sigmoidoscopy or colonoscopy. You may have a sigmoidoscopy every 5 years or a colonoscopy every 10 years starting at age 81.  Hepatitis C blood test.  Hepatitis B blood test.  Sexually transmitted disease (STD) testing.  Diabetes screening. This is done by checking your blood sugar (glucose) after you have not eaten for a while (fasting). You may have this done every 1-3 years.  Bone density scan. This is done to screen for osteoporosis. You may have this done starting at age 67.  Mammogram. This may be done every 1-2 years. Talk to your health care provider about how often you should have regular mammograms. Talk with your health care provider about your test results, treatment options, and if necessary, the need for more tests. Vaccines  Your health care provider may recommend certain vaccines, such as:  Influenza vaccine. This is recommended every year.  Tetanus, diphtheria, and acellular pertussis (Tdap, Td) vaccine. You may need a Td booster every 10 years.  Zoster vaccine. You may need this after age  7.  Pneumococcal 13-valent conjugate (PCV13) vaccine. One dose is recommended after age 54.  Pneumococcal polysaccharide (PPSV23) vaccine. One dose is recommended after age 41. Talk to your health care provider about which screenings and vaccines you need and how often you need them. This information is not intended to replace advice given to you by your health care provider. Make sure you discuss any questions you have with your health care provider. Document Released: 01/19/2015 Document Revised: 09/12/2015 Document Reviewed: 10/24/2014 Elsevier Interactive Patient Education  2017 [place] Prevention in the Home Falls can cause injuries. They can happen to people of all ages. There are many things you can do to make your home safe and to help prevent falls. What can I do on the outside of my home?  Regularly fix the edges of walkways and driveways and fix any cracks.  Remove anything that might make you trip as you walk through a door, such as a raised step or threshold.  Trim any bushes or trees on the path to your home.  Use bright outdoor lighting.  Clear any walking paths of anything that might make someone trip, such as rocks or tools.  Regularly check to see if handrails are loose or broken. Make sure that both sides of any steps have handrails.  Any raised decks and porches should have guardrails on the edges.  Have any leaves, snow, or ice cleared regularly.  Use sand or salt on walking paths during winter.  Clean up any spills in your garage right away. This includes oil or grease spills. What can I do in the bathroom?  Use night lights.  Install grab bars by the toilet and in the tub and shower. Do not use towel bars as grab bars.  Use non-skid mats or decals in the tub or shower.  If you need to sit down in the shower, use a plastic, non-slip stool.  Keep the floor dry. Clean up any water that spills on the floor as soon as it happens.  Remove  soap buildup in the tub or shower regularly.  Attach bath mats securely with double-sided non-slip rug tape.  Do not have throw rugs and other things on the floor that can make you trip. What can I do in the bedroom?  Use night lights.  Make sure that you have a light by your bed that is easy to reach.  Do not use any sheets or blankets that are too big for your bed. They should not hang down onto the floor.  Have a firm chair that has side arms. You can use this for support while you get dressed.  Do not have throw rugs and other things on the floor that can make you trip. What can I do in the kitchen?  Clean up any spills right away.  Avoid walking on wet floors.  Keep items that you use a lot in easy-to-reach places.  If you need to reach something above you, use a strong step stool that has a grab bar.  Keep electrical cords out of the way.  Do not use floor polish or wax that makes floors slippery. If you must use wax, use non-skid floor wax.  Do not have throw rugs and other things on the floor that  can make you trip. What can I do with my stairs?  Do not leave any items on the stairs.  Make sure that there are handrails on both sides of the stairs and use them. Fix handrails that are broken or loose. Make sure that handrails are as long as the stairways.  Check any carpeting to make sure that it is firmly attached to the stairs. Fix any carpet that is loose or worn.  Avoid having throw rugs at the top or bottom of the stairs. If you do have throw rugs, attach them to the floor with carpet tape.  Make sure that you have a light switch at the top of the stairs and the bottom of the stairs. If you do not have them, ask someone to add them for you. What else can I do to help prevent falls?  Wear shoes that:  Do not have high heels.  Have rubber bottoms.  Are comfortable and fit you well.  Are closed at the toe. Do not wear sandals.  If you use a  stepladder:  Make sure that it is fully opened. Do not climb a closed stepladder.  Make sure that both sides of the stepladder are locked into place.  Ask someone to hold it for you, if possible.  Clearly mark and make sure that you can see:  Any grab bars or handrails.  First and last steps.  Where the edge of each step is.  Use tools that help you move around (mobility aids) if they are needed. These include:  Canes.  Walkers.  Scooters.  Crutches.  Turn on the lights when you go into a dark area. Replace any light bulbs as soon as they burn out.  Set up your furniture so you have a clear path. Avoid moving your furniture around.  If any of your floors are uneven, fix them.  If there are any pets around you, be aware of where they are.  Review your medicines with your doctor. Some medicines can make you feel dizzy. This can increase your chance of falling. Ask your doctor what other things that you can do to help prevent falls. This information is not intended to replace advice given to you by your health care provider. Make sure you discuss any questions you have with your health care provider. Document Released: 10/19/2008 Document Revised: 05/31/2015 Document Reviewed: 01/27/2014 Elsevier Interactive Patient Education  2017 [employer].

## 2019-06-16 NOTE — Progress Notes (Signed)
PCP notes:  Health Maintenance: No gaps noted   Abnormal Screenings: none   Patient concerns: Discuss fractures in lower back and rheumatoid arthritis.    Nurse concerns: none   Next PCP appt.: 06/22/2019 @ 9:30 am

## 2019-06-16 NOTE — Progress Notes (Signed)
Subjective:   AMBERROSE FRIEBEL is a 74 y.o. female who presents for Medicare Annual (Subsequent) preventive examination.  Review of Systems: N/A   I connected with the patient today by telephone and verified that I am speaking with the correct person using two identifiers. Location patient: home Location nurse: work Persons participating in the virtual visit: patient, Marine scientist.   I discussed the limitations, risks, security and privacy concerns of performing an evaluation and management service by telephone and the availability of in person appointments. I also discussed with the patient that there may be a patient responsible charge related to this service. The patient expressed understanding and verbally consented to this telephonic visit.    Interactive audio and video telecommunications were attempted between this nurse and patient, however failed, due to patient having technical difficulties OR patient did not have access to video capability.  We continued and completed visit with audio only.     Cardiac Risk Factors include: advanced age (>67mn, >>47women);dyslipidemia     Objective:     Vitals: LMP 01/06/1993   There is no height or weight on file to calculate BMI.  Advanced Directives 06/16/2019 04/08/2018 02/15/2018 04/01/2017 03/31/2016 03/28/2015 03/28/2015  Does Patient Have a Medical Advance Directive? Yes Yes Yes Yes Yes Yes Yes  Type of AParamedicof ACalciumLiving will [place]Living will HTa[place] PuebloLiving will;Out of facility DNR (pink MOST or yellow form) HRem[place]Living will HFort StocktonLiving will HLeipsicLiving will HRush CenterLiving will  Does patient want to make changes to medical advance directive? - - - - - No - Patient declined No - Patient declined  Copy of [place] Chart? No - copy requested No - copy  requested No - copy requested No - copy requested No - copy requested No - copy requested No - copy requested    Tobacco Social History   Tobacco Use  Smoking Status Never Smoker  Smokeless Tobacco Never Used     Counseling given: Not Answered   Clinical Intake:  Pre-visit preparation completed: Yes  Pain : 0-10 Pain Score: 3  Pain Type: Chronic pain Pain Location: Back Pain Descriptors / Indicators: Aching Pain Onset: More than a month ago Pain Frequency: Intermittent     Nutritional Risks: None Diabetes: No CBG done?: No Did pt. bring in CBG monitor from home?: No  How often do you need to have someone help you when you read instructions, pamphlets, or other written materials from your doctor or pharmacy?: 1 - Never What is the last grade level you completed in school?: 12th  Interpreter Needed?: No  Information entered by :: CJohnson, LPN  Past Medical History:  Diagnosis Date  . Asthma   . Constipation   . Fibroid   . GERD (gastroesophageal reflux disease)   . Insomnia   . Lumbar vertebral fracture (HCC)    L5-S1  . Menopausal symptoms   . Osteoporosis   . Postmenopausal HRT (hormone replacement therapy)   . RA (rheumatoid arthritis) ([place] 11/[place]   methotrexate and Embrel  . Rosacea   . Sjogren's disease ([place]   . Sleep apnea 2008   C-Pap  . Vitamin D deficiency    Past Surgical History:  Procedure Laterality Date  . ABDOMINAL HYSTERECTOMY  01/1993   partial with AP repair, secondary to fibroids  . BREAST SURGERY  1991   breast biopsy  . CYSTOCELE  REPAIR  10/2007   with graft  . MELANOMA EXCISION Right 01/2012   excision right posterior arm  . NASAL SINUS SURGERY  07/1991  . VAGINAL DELIVERY     x3   Family History  Problem Relation Age of Onset  . Breast cancer Sister 31  . Thyroid disease Sister   . Cancer Sister 37       Leukemia--CLL  . Heart disease Brother    Social History   Socioeconomic History  . Marital status: Married      Spouse name: Not on file  . Number of children: Not on file  . Years of education: Not on file  . Highest education level: Not on file  Occupational History  . Occupation: housewife    Employer: Not Employed  Tobacco Use  . Smoking status: Never Smoker  . Smokeless tobacco: Never Used  Vaping Use  . Vaping Use: Never used  Substance and Sexual Activity  . Alcohol use: No    Alcohol/week: 0.0 standard drinks  . Drug use: No  . Sexual activity: Yes    Partners: Male    Birth control/protection: Surgical    Comment: TAH  Other Topics Concern  . Not on file  Social History Narrative  . Not on file   Social Determinants of Health   Financial Resource Strain: Low Risk   . Difficulty of Paying Living Expenses: Not hard at all  Food Insecurity: No Food Insecurity  . Worried About Charity fundraiser in the Last Year: Never true  . Ran Out of Food in the Last Year: Never true  Transportation Needs: No Transportation Needs  . Lack of Transportation (Medical): No  . Lack of Transportation (Non-Medical): No  Physical Activity: Insufficiently Active  . Days of Exercise per Week: 2 days  . Minutes of Exercise per Session: 60 min  Stress: No Stress Concern Present  . Feeling of Stress : Not at all  Social Connections:   . Frequency of Communication with Friends and Family:   . Frequency of Social Gatherings with Friends and Family:   . Attends Religious Services:   . Active Member of Clubs or Organizations:   . Attends Archivist Meetings:   Marland Kitchen Marital Status:     Outpatient Encounter Medications as of 06/16/2019  Medication Sig  . acetaminophen (TYLENOL) 500 MG tablet Take 500 mg by mouth every 8 (eight) hours as needed.  Marland Kitchen albuterol (PROAIR HFA) 108 (90 Base) MCG/ACT inhaler Inhale 2 puffs into the lungs every 4 (four) hours as needed for wheezing or shortness of breath.  Marland Kitchen aspirin 81 MG tablet Take 81 mg by mouth daily.   . budesonide (PULMICORT) 180 MCG/ACT  inhaler Inhale 1 puff into the lungs daily as needed.  . Calcium Carbonate-Vitamin D (CALCIUM 500/D PO) Take 1 tablet by mouth 2 (two) times daily.  . Cholecalciferol (VITAMIN D3) 2000 UNITS TABS Take 1 tablet by mouth daily.    . cycloSPORINE (RESTASIS) 0.05 % ophthalmic emulsion Place 1 drop into both eyes 2 (two) times daily.  Marland Kitchen desonide (DESOWEN) 0.05 % lotion Apply topically as needed.  . diclofenac Sodium (VOLTAREN) 1 % GEL Apply 2-4 grams to affected joint 4 times daily as needed.  . Estradiol (VAGIFEM) 10 MCG TABS vaginal tablet Place 1 tablet (10 mcg total) vaginally 2 (two) times a week. Use every night before bed for two weeks when you first begin this medicine, then after the first two weeks, begin using  it twice a week.  . famotidine (PEPCID) 40 MG tablet TAKE 1 TABLET TWICE A DAY  . fluticasone (FLONASE) 50 MCG/ACT nasal spray USE 2 SPRAYS IN EACH NOSTRIL DAILY AS NEEDED FOR RHINITIS OR ALLERGIES  . folic acid (FOLVITE) 1 MG tablet Take 2 tablets (2 mg total) by mouth daily.  . Ibuprofen 200 MG CAPS Take 3 capsules by mouth as needed.  . Magnesium Malate 1250 (141.7 Mg) MG TABS Take 3 tablets by mouth daily.   . methotrexate 50 MG/2ML injection Inject 0.8 mLs (20 mg total) into the skin once a week.  . Multiple Vitamin (MULTI-VITAMINS) TABS Take 1 tablet by mouth daily.  . Omega-3 1000 MG CAPS Take 1 tablet by mouth daily.  Marland Kitchen ORENCIA 125 MG/ML SOSY INJECT THE CONTENTS OF 1 SYRINGE UNDER THE SKIN EVERY WEEK  . Tuberculin-Allergy Syringes 27G X 1/2" 1 ML KIT Inject 1 Syringe into the skin once a week. To be used weekly with methotrexate.  . TURMERIC CURCUMIN PO Take by mouth daily.  . zoledronic acid (RECLAST) 5 MG/100ML SOLN injection Inject 5 mg into the vein once. Takes yearly  . zolpidem (AMBIEN) 10 MG tablet Take 0.5 tablets (5 mg total) by mouth at bedtime as needed for sleep.   No facility-administered encounter medications on file as of 06/16/2019.    Activities of Daily  Living In your present state of health, do you have any difficulty performing the following activities: 06/16/2019  Hearing? N  Vision? N  Difficulty concentrating or making decisions? N  Walking or climbing stairs? N  Dressing or bathing? N  Doing errands, shopping? N  Preparing Food and eating ? N  Using the Toilet? N  In the past six months, have you accidently leaked urine? N  Do you have problems with loss of bowel control? N  Managing your Medications? N  Managing your Finances? N  Housekeeping or managing your Housekeeping? N  Some recent data might be hidden    Patient Care Team: Tower, [name], MD as PCP - General Jarome Matin, MD as Consulting Physician (Dermatology) Juanita Craver, MD as Consulting Physician (Gastroenterology) Marica Otter, OD as Consulting Physician (Optometry) Belva Crome, MD as Consulting Physician (Cardiology)    Assessment:   This is a routine wellness examination for [name].  Exercise Activities and Dietary recommendations Current Exercise Habits: Home exercise routine, Type of exercise: walking, Time (Minutes): 60, Frequency (Times/Week): 2, Weekly Exercise (Minutes/Week): 120, Intensity: Moderate, Exercise limited by: None identified  Goals    . Increase physical activity     Starting 04/08/18, I will continue to exercise for 20-30 minutes 5 days per week.     . Patient Stated     06/16/2019, I will continue to walk 2 days a week for 1 hour.        Fall Risk Fall Risk  06/16/2019 04/08/2018 04/01/2017 03/31/2016 03/28/2015  Falls in the past year? 0 0 No No No  Number falls in past yr: 0 - - - -  Injury with Fall? 0 - - - -  Risk Factor Category  - - - - -  Risk for fall due to : No Fall Risks - - - -  Follow up Falls prevention discussed;Falls evaluation completed - - - -   Is the patient's home free of loose throw rugs in walkways, pet beds, electrical cords, etc?   yes      Grab bars in the bathroom? no  Handrails on the stairs?    yes      Adequate lighting?   yes  Timed Get Up and Go performed: N/A  Depression Screen PHQ 2/9 Scores 06/16/2019 04/08/2018 04/01/2017 03/31/2016  PHQ - 2 Score 0 0 0 0  PHQ- 9 Score 0 0 0 -     Cognitive Function MMSE - Mini Mental State Exam 06/16/2019 04/08/2018 04/01/2017 03/31/2016 03/28/2015  Not completed: - Unable to complete - - -  Orientation to time 5 - 5 5 5   Orientation to Place 5 - 5 5 5   Registration 3 - 3 3 3   Attention/ Calculation 5 - 0 0 0  Recall 3 - 3 3 3   Language- name 2 objects - - 0 0 0  Language- repeat 1 - 1 1 1   Language- follow 3 step command - - 3 3 3   Language- read & follow direction - - 0 0 0  Write a sentence - - 0 0 0  Copy design - - 0 0 0  Total score - - 20 20 20   Mini Cog  Mini-Cog screen was completed. Maximum score is 22. A value of 0 denotes this part of the MMSE was not completed or the patient failed this part of the Mini-Cog screening.       Immunization History  Administered Date(s) Administered  . DTaP 04/06/2013  . Influenza Split 10/22/2010  . Influenza Whole 10/10/2009, 10/20/2011  . Influenza, High Dose Seasonal PF 10/14/2016, 10/08/2018  . Influenza, Seasonal, Injecte, Preservative Fre 10/06/2012  . Influenza,inj,Quad PF,6+ Mos 10/30/2014  . Influenza,inj,quad, With Preservative 10/12/2017  . Influenza-Unspecified 10/26/2013, 10/22/2015  . Pneumococcal Conjugate-13 02/13/2014  . Pneumococcal Polysaccharide-23 06/12/2010, 03/28/2015  . Td 02/07/2003, 03/23/2013  . Tdap 03/23/2013  . Zoster 05/08/2014    Qualifies for Shingles Vaccine: Yes   Screening Tests Health Maintenance  Topic Date Due  . COVID-19 Vaccine (1) Never done  . INFLUENZA VACCINE  08/07/2019  . MAMMOGRAM  02/07/2020  . COLONOSCOPY  01/13/2023  . TETANUS/TDAP  03/24/2023  . DEXA SCAN  Completed  . Hepatitis C Screening  Completed  . PNA vac Low Risk Adult  Completed    Cancer Screenings: Lung: Low Dose CT Chest recommended if Age 27-80 years,  30 pack-year currently smoking OR have quit w/in 15 years. Patient does not qualify. Breast:  Up to date on Mammogram: Yes, completed 02/07/2019   Up to date of Bone Density/Dexa: Yes, completed 02/07/2019 Colorectal: completed 01/12/2013  Additional Screenings:  Hepatitis C Screening: 10/31/2015     Plan:    Patient will continue to walk 2 days a week for 1 hour.    I have personally reviewed and noted the following in the patient's chart:   . Medical and social history . Use of alcohol, tobacco or illicit drugs  . Current medications and supplements . Functional ability and status . Nutritional status . Physical activity . Advanced directives . List of other physicians . Hospitalizations, surgeries, and ER visits in previous 12 months . Vitals . Screenings to include cognitive, depression, and falls . Referrals and appointments  In addition, I have reviewed and discussed with patient certain preventive protocols, quality metrics, and best practice recommendations. A written personalized care plan for preventive services as well as general preventive health recommendations were provided to patient.     Andrez Grime, LPN  [id number]

## 2019-06-22 ENCOUNTER — Other Ambulatory Visit: Payer: Self-pay

## 2019-06-22 ENCOUNTER — Ambulatory Visit (INDEPENDENT_AMBULATORY_CARE_PROVIDER_SITE_OTHER): Payer: Medicare Other | Admitting: Family Medicine

## 2019-06-22 ENCOUNTER — Encounter: Payer: Self-pay | Admitting: Family Medicine

## 2019-06-22 VITALS — BP 126/80 | HR 61 | Temp 97.0°F | Ht 66.0 in | Wt 179.1 lb

## 2019-06-22 DIAGNOSIS — D849 Immunodeficiency, unspecified: Secondary | ICD-10-CM

## 2019-06-22 DIAGNOSIS — M3500 Sicca syndrome, unspecified: Secondary | ICD-10-CM | POA: Diagnosis not present

## 2019-06-22 DIAGNOSIS — L719 Rosacea, unspecified: Secondary | ICD-10-CM

## 2019-06-22 DIAGNOSIS — E7800 Pure hypercholesterolemia, unspecified: Secondary | ICD-10-CM | POA: Diagnosis not present

## 2019-06-22 DIAGNOSIS — I2510 Atherosclerotic heart disease of native coronary artery without angina pectoris: Secondary | ICD-10-CM

## 2019-06-22 DIAGNOSIS — M810 Age-related osteoporosis without current pathological fracture: Secondary | ICD-10-CM | POA: Diagnosis not present

## 2019-06-22 DIAGNOSIS — M0579 Rheumatoid arthritis with rheumatoid factor of multiple sites without organ or systems involvement: Secondary | ICD-10-CM | POA: Diagnosis not present

## 2019-06-22 MED ORDER — FLUTICASONE PROPIONATE 50 MCG/ACT NA SUSP: NASAL | 3 refills | Status: DC

## 2019-06-22 MED ORDER — FAMOTIDINE 40 MG PO TABS: 40.0000 mg | ORAL_TABLET | Freq: Two times a day (BID) | ORAL | 3 refills | Status: DC

## 2019-06-22 MED ORDER — METRONIDAZOLE 0.75 % EX LOTN: TOPICAL_LOTION | CUTANEOUS | 3 refills | Status: DC

## 2019-06-22 NOTE — Assessment & Plan Note (Signed)
Affects face and eyes  Px metro lotion to try for affected skin areas  Adv to use sun protection

## 2019-06-22 NOTE — Assessment & Plan Note (Signed)
With sjogren's syndrome  Continues Orencia and mtx and folic acid with rheumatologist

## 2019-06-22 NOTE — Assessment & Plan Note (Signed)
Rev last dexa 2/21 tx with reclast from her rheumatologist  No falls or fx Good vit D level  Enc her to keep walking

## 2019-06-22 NOTE — Assessment & Plan Note (Signed)
Continues tx per rheumatology

## 2019-06-22 NOTE — Progress Notes (Signed)
Subjective:    Patient ID: Deanna Norris, female    DOB: Jul 23, 1945, 74 y.o.   MRN: [id number]  This visit occurred during the SARS-CoV-2 public health emergency.  Safety protocols were in place, including screening questions prior to the visit, additional usage of staff PPE, and extensive cleaning of exam room while observing appropriate contact time as indicated for disinfecting solutions.    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, music)- unsure if she will get the 2nd one  Also had covid  Zoster status -zostavax 5/16, is interested in shingrix   Mammogram 2/21 Self breast exam - no lumps   Sees Dr Quincy Simmonds for gyn  Uses vagifem twice weekly   Colonoscopy 1/15 with 10 y recall   dexa 2/21 ordered by Dr Garen Grams She is getting reclast infusions [place]-none Fractures-none recent  Supplements-taking D Vit D level is good at 66 Exercise -has been walking   BP Readings from Last 3 Encounters:  06/22/19 126/80  04/22/19 129/63  02/23/19 (!) 130/57   Pulse Readings from Last 3 Encounters:  06/22/19 61  04/22/19 71  02/23/19 (!) 54    RA-sees Dr Garen Grams  (also sjogren's)  Methotrexate Folic acid orencia   Lab Results  Component Value Date   WBC 7.0 06/16/2019   HGB 15.1 (H) 06/16/2019   HCT 44.9 06/16/2019   MCV 97.6 06/16/2019   PLT 250.0 06/16/2019   Lab Results  Component Value Date   CREATININE 0.65 06/16/2019   BUN 18 06/16/2019   NA 138 06/16/2019   K 4.1 06/16/2019   CL 103 06/16/2019   CO2 28 06/16/2019   Glucose 91   Hyperlipidemia Lab Results    Component Value Date   CHOL 216 (H) 06/16/2019   CHOL 228 (H) 06/09/2018   CHOL 220 (H) 04/01/2017   Lab Results  Component Value Date   HDL 59.50 06/16/2019   HDL 68.10 06/09/2018   HDL 67.30 04/01/2017   Lab Results  Component Value Date   LDLCALC 141 (H) 06/16/2019   LDLCALC 134 (H) 06/09/2018   LDLCALC 131 (H) 04/01/2017   Lab Results  Component Value Date   TRIG 78.0 06/16/2019   TRIG 133.0 06/09/2018   TRIG 105.0 04/01/2017   Lab Results  Component Value Date   CHOLHDL 4 06/16/2019   CHOLHDL 3 06/09/2018   CHOLHDL 3 04/01/2017   Lab Results  Component Value Date   LDLDIRECT 144.2 02/02/2013   LDLDIRECT 137.3 02/02/2012   Is on lower carb diet  Eating more eggs  Beef once a week  No fatty pork  Not much cheese   Patient Active Problem List   Diagnosis Date Noted  . Allergic rhinitis 04/08/2018  . Immunocompromised ([place]) 02/17/2018  . 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 Primary osteoarthritis of both hands 01/11/2016  . 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/11/2016  . High risk  medication use 11/15/2015  . Sjogren's syndrome ([place]) 10/31/2015  . Chronic constipation 10/23/2015  . Rosacea 10/23/2015  . Rheumatoid arthritis ([place]) 03/29/2015  . Coronary artery calcification seen on CT scan 11/17/2013  . Recurrent sinusitis 09/16/2013  . Encounter for Medicare annual wellness exam 01/31/2013  . Dyspnea on exertion 09/14/2012  . Nodule of right lung 09/14/2012  . Hyperlipidemia 05/08/2009  . 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 Asthma 05/08/2009  . GERD 05/08/2009  . MENOPAUSAL DISORDER 05/08/2009  . ARTHRITIS, RHEUMATOID 05/08/2009  . Osteoporosis 05/08/2009  . SPONDYLOLISTHESIS 05/08/2009  . Sleep apnea 05/08/2009   Past Medical History:  Diagnosis Date  . Asthma   . Constipation   . Fibroid   . GERD (gastroesophageal reflux  disease)   . Insomnia   . Lumbar vertebral fracture ([facility])    L5-S1  . Menopausal symptoms   . Osteoporosis   . Postmenopausal HRT (hormone replacement therapy)   . RA (rheumatoid arthritis) ([place]) 11/2004   methotrexate and Embrel  . Rosacea   . Sjogren's disease ([place])   . Sleep apnea 2008   C-Pap  . Vitamin D deficiency    Past Surgical History:  Procedure Laterality Date  . ABDOMINAL HYSTERECTOMY  01/1993   partial with AP repair, secondary to fibroids  . BREAST SURGERY  1991   breast biopsy  . CYSTOCELE REPAIR  10/2007   with graft  . MELANOMA EXCISION Right 01/2012   excision right posterior arm  . NASAL SINUS SURGERY  07/1991  . VAGINAL DELIVERY     x3   Social History   Tobacco Use  . Smoking status: Never Smoker  . Smokeless tobacco: Never Used  Vaping Use  . Vaping Use: Never used  Substance Use Topics  . Alcohol use: No    Alcohol/week: 0.0 standard drinks  . Drug use: No   Family History  Problem Relation Age of Onset  . Breast cancer Sister 65  . Thyroid disease Sister   . Cancer Sister 80       Leukemia--CLL  . Heart disease Brother    Allergies  Allergen Reactions  . Adalimumab     REACTION: frequent sinus infection  . Diclofenac     unk  . Dicyclomine     Turn teeth gray  . Doxycycline Hyclate Other (See Comments)    "turns teeth gray"  . Enbrel [Etanercept]     SINUS INFECTIONS  . Fosamax [Alendronate Sodium]     Acid reflux  . Methotrexate Derivatives Nausea Only    Oral metotrexate  . Nickel   . Piroxicam     unk  . Salagen [Pilocarpine Hcl] Other (See Comments)    Excessive sweating, headaches  . Sulfamethoxazole-Trimethoprim     REACTION: face and hands rash and felt burning sensation  . Tramadol Hcl     REACTION: nausea  . Plaquenil [Hydroxychloroquine] Rash   Current Outpatient Medications on File Prior to Visit  Medication Sig Dispense Refill  . acetaminophen (TYLENOL) 500 MG tablet Take 500 mg by mouth every 8 (eight)  hours as needed.    Marland Kitchen albuterol (PROAIR HFA) 108 (90 Base) MCG/ACT inhaler Inhale 2 puffs into the lungs every 4 (four) hours as needed for wheezing or shortness of breath. 18 g 3  . aspirin 81 MG tablet Take 81 mg by mouth daily.     . budesonide (PULMICORT) 180 MCG/ACT inhaler Inhale 1 puff into the lungs daily as needed.    . Calcium Carbonate-Vitamin  D (CALCIUM 500/D PO) Take 1 tablet by mouth 2 (two) times daily.    . Cholecalciferol (VITAMIN D3) 2000 UNITS TABS Take 1 tablet by mouth daily.      . cycloSPORINE (RESTASIS) 0.05 % ophthalmic emulsion Place 1 drop into both eyes 2 (two) times daily.    Marland Kitchen desonide (DESOWEN) 0.05 % lotion Apply topically as needed.    . diclofenac Sodium (VOLTAREN) 1 % GEL Apply 2-4 grams to affected joint 4 times daily as needed. 400 g 2  . Estradiol (VAGIFEM) 10 MCG TABS vaginal tablet Place 1 tablet (10 mcg total) vaginally 2 (two) times a week. Use every night before bed for two weeks when you first begin this medicine, then after the first two weeks, begin using it twice a week. 42 tablet 3  . folic acid (FOLVITE) 1 MG tablet Take 2 tablets (2 mg total) by mouth daily. 180 tablet 3  . Ibuprofen 200 MG CAPS Take 3 capsules by mouth as needed.    . Magnesium Malate 1250 (141.7 Mg) MG TABS Take 3 tablets by mouth daily.     . methotrexate 50 MG/2ML injection Inject 0.8 mLs (20 mg total) into the skin once a week. 10 mL 0  . Multiple Vitamin (MULTI-VITAMINS) TABS Take 1 tablet by mouth daily.    . Omega-3 1000 MG CAPS Take 1 tablet by mouth daily.    Marland Kitchen ORENCIA 125 MG/ML SOSY INJECT THE CONTENTS OF 1 SYRINGE UNDER THE SKIN EVERY WEEK 4 mL 2  . Tuberculin-Allergy Syringes 27G X 1/2" 1 ML KIT Inject 1 Syringe into the skin once a week. To be used weekly with methotrexate.    . zoledronic acid (RECLAST) 5 MG/100ML SOLN injection Inject 5 mg into the vein once. Takes yearly    . zolpidem (AMBIEN) 10 MG tablet Take 0.5 tablets (5 mg total) by mouth at bedtime as needed  for sleep. 30 tablet 0   No current facility-administered medications on file prior to visit.     Review of Systems  Constitutional: Positive for fatigue. Negative for activity change, appetite change, fever and unexpected weight change.  HENT: Negative for congestion, ear pain, rhinorrhea, sinus pressure and sore throat.        Dry eyes and mouth   Eyes: Negative for pain, redness and visual disturbance.  Respiratory: Negative for cough, shortness of breath and wheezing.   Cardiovascular: Negative for chest pain and palpitations.  Gastrointestinal: Negative for abdominal pain, blood in stool, constipation and diarrhea.  Endocrine: Negative for polydipsia and polyuria.  Genitourinary: Negative for dysuria, frequency and urgency.  Musculoskeletal: Positive for arthralgias and back pain. Negative for myalgias.  Skin: Negative for pallor and rash.  Allergic/Immunologic: Negative for environmental allergies.  Neurological: Negative for dizziness, syncope and headaches.  Hematological: Negative for adenopathy. Does not bruise/bleed easily.  Psychiatric/Behavioral: Negative for decreased concentration and dysphoric mood. The patient is not nervous/anxious.        Objective:   Physical Exam Constitutional:      General: She is not in acute distress.    Appearance: Normal appearance. She is well-developed. She is not ill-appearing or diaphoretic.  HENT:     Head: Normocephalic and atraumatic.     Right Ear: Tympanic membrane, ear canal and external ear normal.     Left Ear: Tympanic membrane, ear canal and external ear normal.     Nose: Nose normal. No congestion.     Mouth/Throat:     Mouth: Mucous membranes  are moist.     Pharynx: Oropharynx is clear. No posterior oropharyngeal erythema.  Eyes:     General: No scleral icterus.    Extraocular Movements: Extraocular movements intact.     Conjunctiva/sclera: Conjunctivae normal.     Pupils: Pupils are equal, round, and reactive to  light.  Neck:     Thyroid: No thyromegaly.     Vascular: No carotid bruit or JVD.  Cardiovascular:     Rate and Rhythm: Normal rate and regular rhythm.     Pulses: Normal pulses.     Heart sounds: Normal heart sounds. No gallop.   Pulmonary:     Effort: Pulmonary effort is normal. No respiratory distress.     Breath sounds: Normal breath sounds. No wheezing.     Comments: Good air exch Chest:     Chest wall: No tenderness.  Abdominal:     General: Bowel sounds are normal. There is no distension or abdominal bruit.     Palpations: Abdomen is soft. There is no mass.     Tenderness: There is no abdominal tenderness.     Hernia: No hernia is present.  Genitourinary:    Comments: Breast and pelvic exam done by gyn   Musculoskeletal:        General: No tenderness. Normal range of motion.     Cervical back: Normal range of motion and neck supple. No rigidity. No muscular tenderness.     Right lower leg: No edema.     Left lower leg: No edema.  Lymphadenopathy:     Cervical: No cervical adenopathy.  Skin:    General: Skin is warm and dry.     Coloration: Skin is not pale.     Findings: No erythema or rash.     Comments: Solar lentigines diffusely  Some sks  Mild rosacea   Neurological:     Mental Status: She is alert. Mental status is at baseline.     Cranial Nerves: No cranial nerve deficit.     Motor: No abnormal muscle tone.     Coordination: Coordination normal.     Gait: Gait normal.     Deep Tendon Reflexes: Reflexes are normal and symmetric. Reflexes normal.  Psychiatric:        Mood and Affect: Mood normal.        Cognition and Memory: Cognition and memory normal.           Assessment & Plan:   Problem List Items Addressed This Visit      Musculoskeletal and Integument   Osteoporosis - Primary    Rev last dexa 2/21 tx with reclast from her rheumatologist  No falls or fx Good vit D level  Enc her to keep walking        Rheumatoid arthritis ([place])     With sjogren's syndrome  Continues Orencia and mtx and folic acid with rheumatologist         Rosacea    Affects face and eyes  Px metro lotion to try for affected skin areas  Adv to use sun protection         Other   Hyperlipidemia    LDL is up - need to watch that  More fat in diet lately (low carb)  Disc some hanges  Disc goals for lipids and reasons to control them Rev last labs with pt Rev low sat fat diet in detail Consider statin if this continues to climb      Sjogren's syndrome ([place])  Continues tx per rheumatology      Immunocompromised ([place])    From methotrexate and Orencia  Unsure if this would affect efficacy of covid vaccine  I still enc her to get the 2nd in her series and to continue precautions (masks)

## 2019-06-22 NOTE — Patient Instructions (Addendum)
If you are interested in the new shingles vaccine (Shingrix) - call your local pharmacy to check on coverage and availability  If affordable, get on a wait list at your pharmacy to get the vaccine.    We will watch cholesterol  Avoid red meat/ fried foods/ egg yolks/ fatty breakfast meats/ butter, cheese and high fat dairy/ and shellfish    Try the metro lotion for rosacea   Use sun protection    Take care of yourself

## 2019-06-22 NOTE — Assessment & Plan Note (Signed)
From methotrexate and Orencia  Unsure if this would affect efficacy of covid vaccine  I still enc her to get the 2nd in her series and to continue precautions (masks)

## 2019-06-22 NOTE — Assessment & Plan Note (Signed)
LDL is up - need to watch that  More fat in diet lately (low carb)  Disc some hanges  Disc goals for lipids and reasons to control them Rev last labs with pt Rev low sat fat diet in detail Consider statin if this continues to climb

## 2019-06-23 ENCOUNTER — Encounter: Payer: Medicare Other | Admitting: Family Medicine

## 2019-07-07 ENCOUNTER — Telehealth: Payer: Self-pay | Admitting: Family Medicine

## 2019-07-07 DIAGNOSIS — H9193 Unspecified hearing loss, bilateral: Secondary | ICD-10-CM

## 2019-07-07 NOTE — Telephone Encounter (Signed)
Patient has an appointment at [facility] on 07/12/19 for a hearing test.  [facility], is asking for a referral. Please fax referral to Audiology-fax number [phone].

## 2019-07-08 ENCOUNTER — Telehealth: Payer: Self-pay | Admitting: Rheumatology

## 2019-07-08 ENCOUNTER — Other Ambulatory Visit: Payer: Self-pay | Admitting: Pharmacist

## 2019-07-08 DIAGNOSIS — M0579 Rheumatoid arthritis with rheumatoid factor of multiple sites without organ or systems involvement: Secondary | ICD-10-CM

## 2019-07-08 DIAGNOSIS — M810 Age-related osteoporosis without current pathological fracture: Secondary | ICD-10-CM

## 2019-07-08 MED ORDER — METHOTREXATE SODIUM CHEMO INJECTION 50 MG/2ML: INTRAMUSCULAR | 0 refills | Status: DC

## 2019-07-08 MED ORDER — ZOLPIDEM TARTRATE 10 MG PO TABS: 5.0000 mg | ORAL_TABLET | Freq: Every evening | ORAL | 0 refills | Status: DC | PRN

## 2019-07-08 NOTE — Telephone Encounter (Signed)
Patient is requesting a return call regarding scheduling her Reclast infusion.

## 2019-07-08 NOTE — Telephone Encounter (Signed)
Last Visit: 04/22/2019 Next Visit: 09/30/2019 Labs: 06/16/2019 Hgb 15.1, Eosinophils Relative 12.1, Eosinophils Absolute 0.8   Current Dose per office note on 04/22/2019: methotrexate 0.8 ml sq every 7 days, Orencia 125 mg every 7 days  Last fill Ambien: 04/22/2019  Okay to refill Ambien, Orencia and MTX?

## 2019-07-08 NOTE — Progress Notes (Signed)
Patient is due for Reclast infusion as last infusion 07/08/2018.  CBC/CMP/VitD within normal limits on 06/16/19.  Orders placed for Reclast and pre medication x 1 dose.  She will need to schedule infusion prior to 07/16/19 or will have to repeat lab work.   Mariella Saa, PharmD, [place], CPP Clinical Specialty Pharmacist (Rheumatology and Pulmonology)  07/08/2019 2:00 PM

## 2019-07-08 NOTE — Telephone Encounter (Signed)
Patient is requesting prescription refills of Methotrexate and Ambien to be sent to [facility].   Patient is also requesting prescription refill of Orencia to be sent to [facility].

## 2019-07-08 NOTE — Telephone Encounter (Signed)
Dr. Glori Bickers, please advise on referral

## 2019-07-10 DIAGNOSIS — H9190 Unspecified hearing loss, unspecified ear: Secondary | ICD-10-CM | POA: Insufficient documentation

## 2019-07-10 NOTE — Telephone Encounter (Signed)
Referral done

## 2019-07-12 NOTE — Telephone Encounter (Signed)
Referral faxed to [facility] attn Roderic Ovens

## 2019-07-19 ENCOUNTER — Other Ambulatory Visit (HOSPITAL_COMMUNITY): Payer: Self-pay | Admitting: *Deleted

## 2019-07-20 ENCOUNTER — Ambulatory Visit (HOSPITAL_COMMUNITY)
Admission: RE | Admit: 2019-07-20 | Discharge: 2019-07-20 | Disposition: A | Discharge status: Home / Self Care | Payer: Medicare Other | Source: Ambulatory Visit | Attending: Rheumatology | Admitting: Rheumatology

## 2019-07-20 ENCOUNTER — Other Ambulatory Visit: Payer: Self-pay

## 2019-07-20 DIAGNOSIS — M810 Age-related osteoporosis without current pathological fracture: Secondary | ICD-10-CM | POA: Diagnosis not present

## 2019-07-20 LAB — CREATININE, SERUM
Creatinine, Ser: 0.75 mg/dL (ref 0.44–1.00)
GFR calc Af Amer: >60 mL/min (ref >60)
GFR calc non Af Amer: >60 mL/min (ref >60)

## 2019-07-20 LAB — CALCIUM: Calcium: 9.1 mg/dL (ref 8.9–10.3)

## 2019-07-20 MED ORDER — ZOLEDRONIC ACID 5 MG/100ML IV SOLN: 5.0000 mg | Freq: Once | INTRAVENOUS | Status: AC

## 2019-07-20 MED ORDER — DIPHENHYDRAMINE HCL 25 MG PO CAPS: 25.0000 mg | ORAL_CAPSULE | Freq: Once | ORAL | Status: DC

## 2019-07-20 MED ORDER — ZOLEDRONIC ACID 5 MG/100ML IV SOLN
INTRAVENOUS | Status: AC
  Administered 2019-07-20: 5 mg via INTRAVENOUS
  Filled 2019-07-20: qty 100

## 2019-07-20 MED ORDER — ACETAMINOPHEN 325 MG PO TABS: 650.0000 mg | ORAL_TABLET | Freq: Once | ORAL | Status: DC

## 2019-07-21 NOTE — Progress Notes (Signed)
Calcium and creatinine are normal.

## 2019-07-25 ENCOUNTER — Telehealth: Payer: Self-pay | Admitting: Rheumatology

## 2019-07-25 DIAGNOSIS — M0579 Rheumatoid arthritis with rheumatoid factor of multiple sites without organ or systems involvement: Secondary | ICD-10-CM

## 2019-07-25 MED ORDER — ORENCIA 125 MG/ML ~~LOC~~ SOSY: PREFILLED_SYRINGE | SUBCUTANEOUS | 2 refills | Status: DC

## 2019-07-25 NOTE — Telephone Encounter (Signed)
Last visit: 04/22/2019 Next visit: 09/30/2019 Labs: 06/16/2019 Hgb 15.1, Eosinophils Relative 12.1, Eosinophils Absoulte 0.8 TB Gold: 04/11/2019 Neg   Current Dose per office note on 04/22/2019: Orencia 125 mg every 7 days DX: Rheumatoid arthritis involving multiple sites with positive rheumatoid factor   Okay to refill Orencia?

## 2019-07-25 NOTE — Telephone Encounter (Signed)
Patient left a voicemail requesting prescription refill of Orencia to be sent to [facility].

## 2019-09-01 DIAGNOSIS — L814 Other melanin hyperpigmentation: Secondary | ICD-10-CM | POA: Diagnosis not present

## 2019-09-01 DIAGNOSIS — D1801 Hemangioma of skin and subcutaneous tissue: Secondary | ICD-10-CM | POA: Diagnosis not present

## 2019-09-01 DIAGNOSIS — L918 Other hypertrophic disorders of the skin: Secondary | ICD-10-CM | POA: Diagnosis not present

## 2019-09-01 DIAGNOSIS — L821 Other seborrheic keratosis: Secondary | ICD-10-CM | POA: Diagnosis not present

## 2019-09-01 DIAGNOSIS — Z85820 Personal history of malignant melanoma of skin: Secondary | ICD-10-CM | POA: Diagnosis not present

## 2019-09-01 DIAGNOSIS — L738 Other specified follicular disorders: Secondary | ICD-10-CM | POA: Diagnosis not present

## 2019-09-14 NOTE — Progress Notes (Deleted)
Office Visit Note  Patient: Deanna Deanna Norris             Date of Birth: 1945-09-30           MRN: [id number]             PCP: Abner Greenspan, MD Referring: Tower, [name], MD Visit Date: 09/19/2019 Occupation: @GUAROCC @  Subjective:  No chief complaint on file.   History of Present Illness: Deanna Deanna Norris is a 74 y.o. female ***   Activities of Daily Living:  Patient reports morning stiffness for *** {minute/hour:19697}.   Patient {ACTIONS;DENIES/REPORTS:21021675::"Denies"} nocturnal pain.  Difficulty dressing/grooming: {ACTIONS;DENIES/REPORTS:21021675::"Denies"} Difficulty climbing stairs: {ACTIONS;DENIES/REPORTS:21021675::"Denies"} Difficulty getting out of chair: {ACTIONS;DENIES/REPORTS:21021675::"Denies"} Difficulty using hands for taps, buttons, cutlery, and/or writing: {ACTIONS;DENIES/REPORTS:21021675::"Denies"}  No Rheumatology ROS completed.   PMFS History:  Patient Active Problem List   Diagnosis Date Noted  . Hearing loss 07/10/2019  . Allergic rhinitis 04/08/2018  . Immunocompromised ([place]) 02/17/2018  . 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 Primary osteoarthritis of both hands 01/11/2016  . 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/11/2016  . High risk medication use 11/15/2015  . Sjogren's syndrome ([place]) 10/31/2015  . Chronic constipation 10/23/2015  . Rosacea 10/23/2015  . Rheumatoid arthritis (Deanna Deanna Norris) 03/29/2015  . Coronary artery calcification seen on CT scan 11/17/2013  . Recurrent sinusitis 09/16/2013  . Encounter for Medicare annual wellness exam 01/31/2013  . Dyspnea on exertion 09/14/2012  . Nodule of right lung 09/14/2012  . Hyperlipidemia 05/08/2009  . 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 Asthma 05/08/2009  . GERD 05/08/2009  . MENOPAUSAL DISORDER 05/08/2009  . ARTHRITIS, RHEUMATOID 05/08/2009  . Osteoporosis 05/08/2009  .  SPONDYLOLISTHESIS 05/08/2009  . Sleep apnea 05/08/2009    Past Medical History:  Diagnosis Date  . Asthma   . Constipation   . Fibroid   . GERD (gastroesophageal reflux disease)   . Insomnia   . Lumbar vertebral fracture (Deanna Norris)    L5-S1  . Menopausal symptoms   . Osteoporosis   . Postmenopausal HRT (hormone replacement therapy)   . RA (rheumatoid arthritis) ([place]) 11/2004   methotrexate and Embrel  . Rosacea   . Sjogren's disease (Deanna Norris)   . Sleep apnea 2008   C-Pap  . Vitamin D deficiency     Family History  Problem Relation Age of Onset  . Breast cancer Sister 47  . Thyroid disease Sister   . Cancer Sister 19       Leukemia--CLL  . Heart disease Brother    Past Surgical History:  Procedure Laterality Date  . ABDOMINAL HYSTERECTOMY  01/1993   partial with AP repair, secondary to fibroids  . BREAST SURGERY  1991   breast biopsy  . CYSTOCELE REPAIR  10/2007   with graft  . MELANOMA EXCISION Right 01/2012   excision right posterior arm  . NASAL SINUS SURGERY  07/1991  . VAGINAL DELIVERY     x3   Social History   Social History Narrative  . Not on file   Immunization History  Administered Date(s) Administered  . DTaP 04/06/2013  . Influenza Split 10/22/2010  . Influenza Whole 10/10/2009, 10/20/2011  . Influenza, High Dose Seasonal PF 10/14/2016, 10/08/2018  . Influenza, Seasonal, Injecte, Preservative Fre 10/06/2012  . Influenza,inj,Quad PF,6+ Mos 10/30/2014  . Influenza,inj,quad, With Preservative 10/12/2017  . Influenza-Unspecified 10/26/2013, 10/22/2015  . PFIZER SARS-COV-2 Vaccination 06/02/2019  . Pneumococcal Conjugate-13 02/13/2014  .  Pneumococcal Polysaccharide-23 06/12/2010, 03/28/2015  . Td 02/07/2003, 03/23/2013  . Tdap 03/23/2013  . Zoster 05/08/2014     Objective: Vital Signs: LMP 01/06/1993    Physical Exam   Musculoskeletal Exam: ***  CDAI Exam: CDAI Score: -- Patient Global: --; Provider Global: -- Swollen: --; Tender: -- Joint Exam  09/19/2019   No joint exam has been documented for this visit   There is currently no information documented on the homunculus. Go to the Rheumatology activity and complete the homunculus joint exam.  Investigation: No additional findings.  Imaging: No results found.  Recent Labs: Lab Results  Component Value Date   WBC 7.0 06/16/2019   HGB 15.1 (H) 06/16/2019   PLT 250.0 06/16/2019   NA 138 06/16/2019   K 4.1 06/16/2019   CL 103 06/16/2019   CO2 28 06/16/2019   GLUCOSE 91 06/16/2019   BUN 18 06/16/2019   CREATININE 0.75 07/20/2019   BILITOT 0.6 06/16/2019   ALKPHOS 68 06/16/2019   AST 30 06/16/2019   ALT 28 06/16/2019   PROT 6.5 06/16/2019   ALBUMIN 3.9 06/16/2019   CALCIUM 9.1 07/20/2019   GFRAA >60 07/20/2019   QFTBGOLDPLUS NEGATIVE 04/11/2019    Speciality Comments: Prior therapy: Plaquenil (allergy), Humira and Enbrel (inadequate response)  Procedures:  No procedures performed Allergies: Adalimumab, Diclofenac, Dicyclomine, Doxycycline hyclate, Enbrel [etanercept], Fosamax [alendronate sodium], Methotrexate derivatives, Nickel, Piroxicam, Salagen [pilocarpine hcl], Sulfamethoxazole-trimethoprim, Tramadol hcl, and Plaquenil [hydroxychloroquine]   Assessment / Plan:     Visit Diagnoses: Rheumatoid arthritis involving multiple sites with positive rheumatoid factor ([place])  High risk medication use  Age-related osteoporosis without current pathological fracture  Sicca syndrome (Deanna Norris)  Primary osteoarthritis of both hands  Primary osteoarthritis of both knees  Primary osteoarthritis of both feet  Spondylosis of lumbar region without myelopathy or radiculopathy  DDD (degenerative disc disease), lumbar  History of rosacea  History of gastroesophageal reflux (GERD)  History of hyperlipidemia  History of sleep apnea  Abnormal SPEP  Orders: No orders of the defined types were placed in this encounter.  No orders of the defined types were placed in  this encounter.   Face-to-face time spent with patient was *** minutes. Greater than 50% of time was spent in counseling and coordination of care.  Follow-Up Instructions: No follow-ups on file.   Ofilia Neas, PA-C  Note - This record has been created using Dragon software.  Chart creation errors have been sought, but may not always  have been located. Such creation errors do not reflect on  the standard of medical care.

## 2019-09-16 NOTE — Progress Notes (Signed)
Office Visit Note  Patient: Deanna Norris             Date of Birth: April 09, 1945           MRN: [id number]             PCP: Abner Greenspan, MD Referring: Tower, [name], MD Visit Date: 09/22/2019 Occupation: @GUAROCC @  Subjective:  Medication monitoring  History of Present Illness: Deanna Norris is a 74 y.o. female with history of seropositive rheumatoid arthritis, osteoporosis, and osteoarthritis.  Patient is on Orencia 125 mg subcutaneous injections once weekly, methotrexate 0.8 mL subcu injections once weekly, and folic acid 2 mg by mouth daily.  She denies any recent rheumatoid arthritis flares.  According to the patient she has not had a flare in several years.  She has occasional discomfort in both knees due to underlying osteoarthritis.  She had good results with Euflexxa injections in the past but is not ready to reapply at this time.  She denies any joint swelling at this time.  According to the patient 1 week ago she woke up with discomfort in her right foot which progressively got worse as the day went on.  She denies any injuries or falls prior to the onset of symptoms.  She was evaluated on 09/19/2019 by Dr. Posey Pronto and had x-rays at that time.  According to the patient the x-rays were unremarkable but she was instructed to wear a boot for 3 weeks in case she has a ligament injury or possible stress fracture.  If her symptoms persist the plan is to order an MRI. She continues to have chronic sicca symptoms.  She uses a CPAP at night which exacerbates her symptoms.  She has recently started using Restasis eyedrops which have been helpful.  She continues to have interrupted sleep at night and typically sleeps about 6 hours per night.  She takes Ambien 5 mg by mouth at bedtime as needed for insomnia.  She continues to have chronic fatigue which has been stable overall. She had a Reclast infusion on 07/20/2019.  She did not experiencing any side effects after the infusion.  She  continues to take calcium and vitamin D as recommended. She denies any recent infections.  She has received both COVID-19 vaccinations.  According to the patient she was diagnosed with Covid in December 2020 and is unsure if she will proceed with the third vaccine at this time since she is questioning if she may still have immunity.    Activities of Daily Living:  Patient reports morning stiffness for 5 minutes.   Patient Reports nocturnal pain.  Difficulty dressing/grooming: Denies Difficulty climbing stairs: Reports Difficulty getting out of chair: Reports Difficulty using hands for taps, buttons, cutlery, and/or writing: Denies  Review of Systems  Constitutional: Positive for fatigue.  HENT: Positive for mouth dryness. Negative for mouth sores and nose dryness.   Eyes: Positive for pain, itching and dryness. Negative for visual disturbance.  Respiratory: Positive for shortness of breath. Negative for difficulty breathing.   Cardiovascular: Negative for chest pain and palpitations.  Gastrointestinal: Negative for blood in stool, constipation and diarrhea.  Endocrine: Negative for increased urination.  Genitourinary: Negative for difficulty urinating.  Musculoskeletal: Positive for arthralgias, joint pain and morning stiffness. Negative for joint swelling, myalgias, muscle tenderness and myalgias.  Skin: Negative for color change, rash and redness.  Allergic/Immunologic: Negative for susceptible to infections.  Neurological: Negative for dizziness, headaches, memory loss and weakness.  Hematological: Negative for  bruising/bleeding tendency.  Psychiatric/Behavioral: Positive for sleep disturbance. Negative for confusion.    PMFS History:  Patient Active Problem List   Diagnosis Date Noted  . Hearing loss 07/10/2019  . Allergic rhinitis 04/08/2018  . Immunocompromised ([place]) 02/17/2018  . 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 Spondylosis of  lumbar region without myelopathy or radiculopathy 01/11/2016  . Primary osteoarthritis of both hands 01/11/2016  . 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/11/2016  . High risk medication use 11/15/2015  . Sjogren's syndrome ([place]) 10/31/2015  . Chronic constipation 10/23/2015  . Rosacea 10/23/2015  . Rheumatoid arthritis ([place]) 03/29/2015  . Coronary artery calcification seen on CT scan 11/17/2013  . Recurrent sinusitis 09/16/2013  . Encounter for Medicare annual wellness exam 01/31/2013  . Dyspnea on exertion 09/14/2012  . Nodule of right lung 09/14/2012  . Hyperlipidemia 05/08/2009  . 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 Asthma 05/08/2009  . GERD 05/08/2009  . MENOPAUSAL DISORDER 05/08/2009  . ARTHRITIS, RHEUMATOID 05/08/2009  . Osteoporosis 05/08/2009  . SPONDYLOLISTHESIS 05/08/2009  . Sleep apnea 05/08/2009    Past Medical History:  Diagnosis Date  . Asthma   . Constipation   . Fibroid   . GERD (gastroesophageal reflux disease)   . Insomnia   . Lumbar vertebral fracture (HCC)    L5-S1  . Menopausal symptoms   . Osteoporosis   . Postmenopausal HRT (hormone replacement therapy)   . RA (rheumatoid arthritis) ([place]) 11/2004   methotrexate and Embrel  . Rosacea   . Sjogren's disease ([place])   . Sleep apnea 2008   C-Pap  . Vitamin D deficiency     Family History  Problem Relation Age of Onset  . Breast cancer Sister 63  . Thyroid disease Sister   . Cancer Sister 39       Leukemia--CLL  . Heart disease Brother    Past Surgical History:  Procedure Laterality Date  . ABDOMINAL HYSTERECTOMY  01/1993   partial with AP repair, secondary to fibroids  . BREAST SURGERY  1991   breast biopsy  . CYSTOCELE REPAIR  10/2007   with graft  . MELANOMA EXCISION Right 01/2012   excision right posterior arm  . NASAL SINUS SURGERY  07/1991  . VAGINAL DELIVERY     x3   Social History   Social History Narrative  . Not on file   Immunization History  Administered Date(s) Administered    . DTaP 04/06/2013  . Influenza Split 10/22/2010  . Influenza Whole 10/10/2009, 10/20/2011  . Influenza, High Dose Seasonal PF 10/14/2016, 10/08/2018  . Influenza, Seasonal, Injecte, Preservative Fre 10/06/2012  . Influenza,inj,Quad PF,6+ Mos 10/30/2014  . Influenza,inj,quad, With Preservative 10/12/2017  . Influenza-Unspecified 10/26/2013, 10/22/2015  . PFIZER SARS-COV-2 Vaccination 06/02/2019, 08/26/2019  . Pneumococcal Conjugate-13 02/13/2014  . Pneumococcal Polysaccharide-23 06/12/2010, 03/28/2015  . Td 02/07/2003, 03/23/2013  . Tdap 03/23/2013  . Zoster 05/08/2014     Objective: Vital Signs: BP 117/76 (BP Location: Left Arm, Patient Position: Sitting, Cuff Size: Normal)   Pulse 67   Resp 14   Ht 5\' 6"  (1.676 m)   Wt 182 lb (82.6 kg)   LMP 01/06/1993   BMI 29.38 kg/m    Physical Exam Vitals and nursing note reviewed.  Constitutional:      Appearance: She is well-developed.  HENT:     Head: Normocephalic and atraumatic.  Eyes:     Conjunctiva/sclera: Conjunctivae normal.  Pulmonary:     Effort: Pulmonary effort is normal.  Abdominal:  Palpations: Abdomen is soft.  Musculoskeletal:     Cervical back: Normal range of motion.  Skin:    General: Skin is warm and dry.     Capillary Refill: Capillary refill takes less than 2 seconds.  Neurological:     Mental Status: She is alert and oriented to person, place, and time.  Psychiatric:        Behavior: Behavior normal.      Musculoskeletal Exam: C-spine, thoracic spine, and lumbar spine good ROM.  No midline spinal tenderness.  Shoulder joints, elbow joints, wrist joints, MCPs, PIPs, and DIPs good ROM with no synovitis.  Complete fist formation bilaterally.  Thickening of all MCPs but no synovitis noted.  Hip joints good ROM with no discomfort.  Knee joints good ROM with no warmth or effusion.  Ankle joints good ROM with no tenderness or inflammation.     CDAI Exam: CDAI Score: -- Patient Global: --; Provider  Global: -- Swollen: --; Tender: -- Joint Exam 09/22/2019   No joint exam has been documented for this visit   There is currently no information documented on the homunculus. Go to the Rheumatology activity and complete the homunculus joint exam.  Investigation: No additional findings.  Imaging: DG Foot Complete Left  Result Date: 09/21/2019 Please see detailed radiograph report in office note.   Recent Labs: Lab Results  Component Value Date   WBC 7.0 06/16/2019   HGB 15.1 (H) 06/16/2019   PLT 250.0 06/16/2019   NA 138 06/16/2019   K 4.1 06/16/2019   CL 103 06/16/2019   CO2 28 06/16/2019   GLUCOSE 91 06/16/2019   BUN 18 06/16/2019   CREATININE 0.75 07/20/2019   BILITOT 0.6 06/16/2019   ALKPHOS 68 06/16/2019   AST 30 06/16/2019   ALT 28 06/16/2019   PROT 6.5 06/16/2019   ALBUMIN 3.9 06/16/2019   CALCIUM 9.1 07/20/2019   GFRAA >60 07/20/2019   QFTBGOLDPLUS NEGATIVE 04/11/2019    Speciality Comments: Prior therapy: Plaquenil (allergy), Humira and Enbrel (inadequate response)  Procedures:  No procedures performed Allergies: Adalimumab, Diclofenac, Dicyclomine, Doxycycline hyclate, Enbrel [etanercept], Fosamax [alendronate sodium], Methotrexate derivatives, Nickel, Piroxicam, Salagen [pilocarpine hcl], Sulfamethoxazole-trimethoprim, Tramadol hcl, and Plaquenil [hydroxychloroquine]   Assessment / Plan:     Visit Diagnoses: Rheumatoid arthritis involving multiple sites with positive rheumatoid factor (HCC) - +RF, +CCP, ANA-,severe erosive: She has no joint tenderness or synovitis on exam.  She has not had any recent rheumatoid arthritis flares.  According to the patient it has been several years since she had an RA flare.  She is currently on Orencia 125 mg subcutaneous injections every 7 days, methotrexate 0.8 ml sq injections once weekly, and folic acid 2 mg by mouth daily.  We discussed reducing the dose of methotrexate to 0.6 mL subcutaneous injections once weekly since  she has clinically been doing so well.  She was in agreement.  She was advised to return to her original dose if she starts having increased joint pain, joint swelling, or joint stiffness.  She will continue on Orencia as prescribed.  She will follow-up in the office in 5 months.  High risk medication use - Orencia 125 mg sq injections every 7 days, methotrexate 0.6 ml sq every 7 days, and folic acid 1 mg 2 tablets daily.  CBC and CMP were drawn on 06/16/2019.  Lab results were reviewed with the patient today in the office.  She is due to update lab work today.  Orders for CBC and CMP were  released.  TB gold was negative on 04/11/2019 and will continue to be monitored yearly.- Plan: COMPLETE METABOLIC PANEL WITH GFR, CBC with Differential/Platelet According to the patient she was diagnosed with COVID-19 in December 2020.  She has since received both COVID-19 vaccinations and was encouraged to receive the third dose.  She was advised to hold methotrexate 1 week after receiving the third dose and Orencia 1 week before and week 1 week after if possible.  She is encouraged to continue to wear a mask and social distance.  She was advised to avoid taking Tylenol or NSAIDs 24 hours prior to the third dose.  She was advised to notify us or her PCP if she develops a COVID-19 infection in order to receive the antibody infusion.  She voiced understanding and all questions were addressed.  Age-related osteoporosis without current pathological fracture - DEXA 02/07/2019 Lowest T-score and site measured: Left Femoral Neck -2.9.  Her most recent Reclast infusion was on 07/20/2019.  She did not have any side effects after this infusion.  She continues to take a calcium and vitamin D supplement as recommended.  She has not had any recent falls or fractures.  Sicca syndrome [facility]): She continues to have chronic sicca symptoms and uses over-the-counter products for symptomatic relief.  She was recently started on Restasis which has  been helpful in alleviating some of her eye dryness.  She uses a CPAP at night which seems to exacerbate some of her mouth dryness.  Primary osteoarthritis of both hands: She has PIP and DIP thickening consistent with osteoarthritis of both hands.  No tenderness or inflammation was noted.  She is able to make a complete fist bilaterally.  Joint protection and muscle strengthening were discussed.  Primary osteoarthritis of both knees: She has good range of motion of both knee joints on exam.  No warmth or effusion was noted.  She has occasional discomfort when climbing steps.  X-rays of both knee joints were obtained on 03/28/2019 which were reviewed today in the office.  She had bilateral knee joint Visco injections performed in March/April 2021.  She has found Euflexxa injections to be more effective than Orthovisc.  She was advised to notify us when she would like to reapply for Visco gel injections.  She will continue using Voltaren gel topically as needed for pain relief.  Primary osteoarthritis of both feet: She is not experiencing any discomfort in her feet at this time.  She is wearing proper fitting shoes.  Spondylosis of lumbar region without myelopathy or radiculopathy: She experiences intermittent discomfort in her lower back.  She is not experiencing any symptoms of radiculopathy at this time.  She has no midline spinal tenderness.  DDD (degenerative disc disease), lumbar: She experiences intermittent discomfort in her lower back.  No midline spinal tenderness on exam today.  Other medical conditions are listed as follows:  History of rosacea  History of gastroesophageal reflux (GERD)  History of hyperlipidemia  History of sleep apnea  Abnormal SPEP  Orders: Orders Placed This Encounter  Procedures  . COMPLETE METABOLIC PANEL WITH GFR  . CBC with Differential/Platelet   Meds ordered this encounter  Medications  . zolpidem (AMBIEN) 10 MG tablet    Sig: Take 0.5 tablets (5  mg total) by mouth at bedtime as needed for sleep.    Dispense:  30 tablet    Refill:  0      Follow-Up Instructions: Return in about 5 months (around 02/22/2020) for Rheumatoid arthritis, Osteoporosis,  Osteoarthritis.   Ofilia Neas, PA-C  Note - This record has been created using Dragon software.  Chart creation errors have been sought, but may not always  have been located. Such creation errors do not reflect on  the standard of medical care.

## 2019-09-19 ENCOUNTER — Ambulatory Visit: Payer: Medicare Other | Admitting: Physician Assistant

## 2019-09-19 ENCOUNTER — Ambulatory Visit (INDEPENDENT_AMBULATORY_CARE_PROVIDER_SITE_OTHER): Payer: Medicare Other

## 2019-09-19 ENCOUNTER — Ambulatory Visit (INDEPENDENT_AMBULATORY_CARE_PROVIDER_SITE_OTHER): Payer: Medicare Other | Admitting: Podiatry

## 2019-09-19 ENCOUNTER — Other Ambulatory Visit: Payer: Self-pay

## 2019-09-19 DIAGNOSIS — M778 Other enthesopathies, not elsewhere classified: Secondary | ICD-10-CM

## 2019-09-19 DIAGNOSIS — M79672 Pain in left foot: Secondary | ICD-10-CM

## 2019-09-19 DIAGNOSIS — I2510 Atherosclerotic heart disease of native coronary artery without angina pectoris: Secondary | ICD-10-CM

## 2019-09-20 ENCOUNTER — Encounter: Payer: Self-pay | Admitting: Podiatry

## 2019-09-20 NOTE — Progress Notes (Signed)
Subjective:  Patient ID: Deanna Norris, female    DOB: 11-07-1945,  MRN: [id number]  Chief Complaint  Patient presents with  . Foot Pain    pt is here for left foot pain, primarily on the left plantar forefoot, pt states that the foot pain has been going on for about 2 to 3 days.    74 y.o. female presents with the above complaint.  Patient presents with complaint left dorsal midfoot pain.  Patient states it has been going on about 2 days ago has progressive gotten worse.  Patient states is painful to walk on.  Patient states that she has a history of rheumatoid arthritis and has been swelling as well.  Patient states that she has weird sensation to it.  He has not seen anyone else prior to see me.  sHe would like to discuss treatment options. s He denies any trauma or injury to the area.    Review of Systems: Negative except as noted in the HPI. Denies N/V/F/Ch.  Past Medical History:  Diagnosis Date  . Asthma   . Constipation   . Fibroid   . GERD (gastroesophageal reflux disease)   . Insomnia   . Lumbar vertebral fracture (HCC)    L5-S1  . Menopausal symptoms   . Osteoporosis   . Postmenopausal HRT (hormone replacement therapy)   . RA (rheumatoid arthritis) ([place]) 11/2004   methotrexate and Embrel  . Rosacea   . Sjogren's disease ([place])   . Sleep apnea 2008   C-Pap  . Vitamin D deficiency     Current Outpatient Medications:  .  acetaminophen (TYLENOL) 500 MG tablet, Take 500 mg by mouth every 8 (eight) hours as needed., Disp: , Rfl:  .  albuterol (PROAIR HFA) 108 (90 Base) MCG/ACT inhaler, Inhale 2 puffs into the lungs every 4 (four) hours as needed for wheezing or shortness of breath., Disp: 18 g, Rfl: 3 .  aspirin 81 MG tablet, Take 81 mg by mouth daily. , Disp: , Rfl:  .  budesonide (PULMICORT) 180 MCG/ACT inhaler, Inhale 1 puff into the lungs daily as needed., Disp: , Rfl:  .  Calcium Carbonate-Vitamin D (CALCIUM 500/D PO), Take 1 tablet by mouth 2 (two) times  daily., Disp: , Rfl:  .  Cholecalciferol (VITAMIN D3) 2000 UNITS TABS, Take 1 tablet by mouth daily.  , Disp: , Rfl:  .  cycloSPORINE (RESTASIS) 0.05 % ophthalmic emulsion, Place 1 drop into both eyes 2 (two) times daily., Disp: , Rfl:  .  desonide (DESOWEN) 0.05 % lotion, Apply topically as needed., Disp: , Rfl:  .  diclofenac Sodium (VOLTAREN) 1 % GEL, Apply 2-4 grams to affected joint 4 times daily as needed., Disp: 400 g, Rfl: 2 .  Estradiol (VAGIFEM) 10 MCG TABS vaginal tablet, Place 1 tablet (10 mcg total) vaginally 2 (two) times a week. Use every night before bed for two weeks when you first begin this medicine, then after the first two weeks, begin using it twice a week., Disp: 42 tablet, Rfl: 3 .  famotidine (PEPCID) 40 MG tablet, Take 1 tablet (40 mg total) by mouth 2 (two) times daily., Disp: 180 tablet, Rfl: 3 .  fluticasone (FLONASE) 50 MCG/ACT nasal spray, USE 2 SPRAYS IN EACH NOSTRIL DAILY AS NEEDED FOR RHINITIS OR ALLERGIES, Disp: 48 g, Rfl: 3 .  folic acid (FOLVITE) 1 MG tablet, Take 2 tablets (2 mg total) by mouth daily., Disp: 180 tablet, Rfl: 3 .  Ibuprofen 200 MG CAPS, Take  3 capsules by mouth as needed., Disp: , Rfl:  .  Magnesium Malate 1250 (141.7 Mg) MG TABS, Take 3 tablets by mouth daily. , Disp: , Rfl:  .  methotrexate 50 MG/2ML injection, Inject 0.8 mLs (20 mg total) into the skin once a week., Disp: 10 mL, Rfl: 0 .  METRONIDAZOLE, TOPICAL, 0.75 % LOTN, Apply a thin film to affected area once daily, Disp: 59 mL, Rfl: 3 .  Multiple Vitamin (MULTI-VITAMINS) TABS, Take 1 tablet by mouth daily., Disp: , Rfl:  .  Omega-3 1000 MG CAPS, Take 1 tablet by mouth daily., Disp: , Rfl:  .  ORENCIA 125 MG/ML SOSY, INJECT THE CONTENTS OF 1 SYRINGE UNDER THE SKIN EVERY WEEK, Disp: 4 mL, Rfl: 2 .  Tuberculin-Allergy Syringes 27G X 1/2" 1 ML KIT, Inject 1 Syringe into the skin once a week. To be used weekly with methotrexate., Disp: , Rfl:  .  zoledronic acid (RECLAST) 5 MG/100ML SOLN  injection, Inject 5 mg into the vein once. Takes yearly, Disp: , Rfl:  .  zolpidem (AMBIEN) 10 MG tablet, Take 0.5 tablets (5 mg total) by mouth at bedtime as needed for sleep., Disp: 30 tablet, Rfl: 0  Social History   Tobacco Use  Smoking Status Never Smoker  Smokeless Tobacco Never Used    Allergies  Allergen Reactions  . Adalimumab     REACTION: frequent sinus infection  . Diclofenac     unk  . Dicyclomine     Turn teeth gray  . Doxycycline Hyclate Other (See Comments)    "turns teeth gray"  . Enbrel [Etanercept]     SINUS INFECTIONS  . Fosamax [Alendronate Sodium]     Acid reflux  . Methotrexate Derivatives Nausea Only    Oral metotrexate  . Nickel   . Piroxicam     unk  . Salagen [Pilocarpine Hcl] Other (See Comments)    Excessive sweating, headaches  . Sulfamethoxazole-Trimethoprim     REACTION: face and hands rash and felt burning sensation  . Tramadol Hcl     REACTION: nausea  . Plaquenil [Hydroxychloroquine] Rash   Objective:  There were no vitals filed for this visit. There is no height or weight on file to calculate BMI. Constitutional Well developed. Well nourished.  Vascular Dorsalis pedis pulses palpable bilaterally. Posterior tibial pulses palpable bilaterally. Capillary refill normal to all digits.  No cyanosis or clubbing noted. Pedal hair growth normal.  Neurologic Normal speech. Oriented to person, place, and time. Epicritic sensation to light touch grossly present bilaterally.  Dermatologic Nails well groomed and normal in appearance. No open wounds. No skin lesions.   Orthopedic:  Pain on palpation to the left dorsal aspect of the fourth metatarsal at the base of it.  Pain with dorsiflexion of the digit as well as plantar flexion of the digit likely due to extensor tendinitis.  No pain at the MTPJ joint range of motion or range of motion of the midtarsal joints.   Radiographs: 3 views of skeletally mature the left foot: No osseous  abnormalities noted.  No signs of osteoarthritis noted.  Mild osteoarthritic changes at the midfoot joint dorsal. Assessment:   1. Foot pain, left    Plan:  Patient was evaluated and treated and all questions answered.  Left fourth metatarsal stress fracture versus extensor tendinitis -I explained to the patient the etiology of stress fracture versus extensor tendinitis and various treatment options were extensively discussed.  Given that there is no radiographic evidence of stress  fracture I will treat it as a stress fracture however patient may likely just have extensor tendinitis.  I believe he will benefit from cam boot immobilization as this will treat both the stress fracture symptoms as well as extensor tendinitis. If there is no improvement will consider doing an MRI evaluation. -Cam boot was dispensed  No follow-ups on file.

## 2019-09-21 ENCOUNTER — Other Ambulatory Visit: Payer: Self-pay | Admitting: Podiatry

## 2019-09-21 DIAGNOSIS — M778 Other enthesopathies, not elsewhere classified: Secondary | ICD-10-CM

## 2019-09-22 ENCOUNTER — Ambulatory Visit: Payer: Medicare Other | Admitting: Podiatry

## 2019-09-22 ENCOUNTER — Other Ambulatory Visit: Payer: Self-pay

## 2019-09-22 ENCOUNTER — Ambulatory Visit (INDEPENDENT_AMBULATORY_CARE_PROVIDER_SITE_OTHER): Payer: Medicare Other | Admitting: Physician Assistant

## 2019-09-22 ENCOUNTER — Encounter: Payer: Self-pay | Admitting: Physician Assistant

## 2019-09-22 VITALS — BP 117/76 | HR 67 | Resp 14 | Ht 66.0 in | Wt 182.0 lb

## 2019-09-22 DIAGNOSIS — M170 Bilateral primary osteoarthritis of knee: Secondary | ICD-10-CM

## 2019-09-22 DIAGNOSIS — M5136 Other intervertebral disc degeneration, lumbar region: Secondary | ICD-10-CM | POA: Diagnosis not present

## 2019-09-22 DIAGNOSIS — M3500 Sicca syndrome, unspecified: Secondary | ICD-10-CM | POA: Diagnosis not present

## 2019-09-22 DIAGNOSIS — Z872 Personal history of diseases of the skin and subcutaneous tissue: Secondary | ICD-10-CM

## 2019-09-22 DIAGNOSIS — M0579 Rheumatoid arthritis with rheumatoid factor of multiple sites without organ or systems involvement: Secondary | ICD-10-CM | POA: Diagnosis not present

## 2019-09-22 DIAGNOSIS — M47816 Spondylosis without myelopathy or radiculopathy, lumbar region: Secondary | ICD-10-CM

## 2019-09-22 DIAGNOSIS — Z8719 Personal history of other diseases of the digestive system: Secondary | ICD-10-CM

## 2019-09-22 DIAGNOSIS — R778 Other specified abnormalities of plasma proteins: Secondary | ICD-10-CM

## 2019-09-22 DIAGNOSIS — Z79899 Other long term (current) drug therapy: Secondary | ICD-10-CM | POA: Diagnosis not present

## 2019-09-22 DIAGNOSIS — M810 Age-related osteoporosis without current pathological fracture: Secondary | ICD-10-CM

## 2019-09-22 DIAGNOSIS — M19041 Primary osteoarthritis, right hand: Secondary | ICD-10-CM

## 2019-09-22 DIAGNOSIS — Z8639 Personal history of other endocrine, nutritional and metabolic disease: Secondary | ICD-10-CM

## 2019-09-22 DIAGNOSIS — Z8669 Personal history of other diseases of the nervous system and sense organs: Secondary | ICD-10-CM

## 2019-09-22 DIAGNOSIS — M19071 Primary osteoarthritis, right ankle and foot: Secondary | ICD-10-CM

## 2019-09-22 DIAGNOSIS — M19042 Primary osteoarthritis, left hand: Secondary | ICD-10-CM

## 2019-09-22 DIAGNOSIS — M19072 Primary osteoarthritis, left ankle and foot: Secondary | ICD-10-CM

## 2019-09-22 MED ORDER — ZOLPIDEM TARTRATE 10 MG PO TABS: 5.0000 mg | ORAL_TABLET | Freq: Every evening | ORAL | 0 refills | Status: DC | PRN

## 2019-09-22 NOTE — Patient Instructions (Addendum)
COVID-19 vaccine recommendations:  ° °COVID-19 vaccine is recommended for everyone (unless you are allergic to a vaccine component), even if you are on a medication that suppresses your immune system.  ° °If you are on Methotrexate, Cellcept (mycophenolate), Rinvoq, Xeljanz, and Olumiant- hold the medication for 1 week after each vaccine. Hold Methotrexate for 2 weeks after the single dose COVID-19 vaccine.  ° °If you are on Orencia subcutaneous injection - hold medication one week prior to and one week after the first COVID-19 vaccine dose (only).  ° °If you are on Orencia IV infusions- time vaccination administration so that the first COVID-19 vaccination will occur four weeks after the infusion and postpone the subsequent infusion by one week.  ° °If you are on Cyclophosphamide or Rituxan infusions please contact your doctor prior to receiving the COVID-19 vaccine.  ° °Do not take Tylenol or any anti-inflammatory medications (NSAIDs) 24 hours prior to the COVID-19 vaccination.  ° °There is no direct evidence about the efficacy of the COVID-19 vaccine in individuals who are on medications that suppress the immune system.  ° °Even if you are fully vaccinated, and you are on any medications that suppress your immune system, please continue to wear a mask, maintain at least six feet social distance and practice hand hygiene.  ° °If you develop a COVID-19 infection, please contact your PCP or our office to determine if you need antibody infusion. ° °The booster vaccine is now available for immunocompromised patients. It is advised that if you had Pfizer vaccine you should get Pfizer booster.  If you had a Moderna vaccine then you should get a Moderna booster. Johnson and Johnson does not have a booster vaccine at this time. ° °Please see the following web sites for updated information.   ° °[email or web address]://[email or web address]/[email or web address]/[email or web address]-Vaccination-Patient-Resources.[email or web address] ° °[email or web address]://[email or web address]/About-[email or web address]-Releases/ID/[email or web address] ° °Standing Labs °We placed an order today for your standing lab work.  ° °Please have your standing labs drawn in December and every 3 months  ° °If possible, please have your labs drawn 2 weeks prior to your appointment so that the provider can discuss your results at your appointment. ° °We have open lab daily °Monday through Thursday from 8:30-12:30 PM and 1:30-4:30 PM and Friday from 8:30-12:30 PM and 1:30-4:00 PM °at the office of Dr. Shaili Deveshwar, [facility].   °Please be advised, patients with office appointments requiring lab work will take precedents over walk-in lab work.  °If possible, please come for your lab work on Monday and Friday afternoons, as you may experience shorter wait times. °The office is located at [address], [address], [place], [place] [address] °No appointment is necessary.   °Labs are drawn by [facility]. Please bring your co-pay at the time of your lab draw.  You may receive a bill from [facility] for your lab work. ° °If you wish to have your labs drawn at another location, please call the office 24 hours in advance to send orders. ° °If you have any questions regarding directions or hours of operation,  °please call [phone].   °As a reminder, please drink plenty of water prior to coming for your lab work. Thanks! ° ° °

## 2019-09-23 LAB — COMPLETE METABOLIC PANEL WITH GFR
AG Ratio: 1.8 (calc) (ref 1.0–2.5)
ALT: 25 U/L (ref 6–29)
AST: 22 U/L (ref 10–35)
Albumin: 4 g/dL (ref 3.6–5.1)
Alkaline phosphatase (APISO): 63 U/L (ref 37–153)
BUN: 15 mg/dL (ref 7–25)
CO2: 25 mmol/L (ref 20–32)
Calcium: 9.4 mg/dL (ref 8.6–10.4)
Chloride: 105 mmol/L (ref 98–110)
Creat: 0.73 mg/dL (ref 0.60–0.93)
GFR, Est African American: 95 mL/min/{1.73_m2} (ref >=60)
GFR, Est Non African American: 82 mL/min/{1.73_m2} (ref >=60)
Globulin: 2.2 g/dL (calc) (ref 1.9–3.7)
Glucose, Bld: 86 mg/dL (ref 65–99)
Potassium: 4.5 mmol/L (ref 3.5–5.3)
Sodium: 140 mmol/L (ref 135–146)
Total Bilirubin: 0.5 mg/dL (ref 0.2–1.2)
Total Protein: 6.2 g/dL (ref 6.1–8.1)

## 2019-09-23 LAB — CBC WITH DIFFERENTIAL/PLATELET
Absolute Monocytes: 978 cells/uL — ABNORMAL HIGH (ref 200–950)
Basophils Absolute: 123 cells/uL (ref 0–200)
Basophils Relative: 1.6 %
Eosinophils Absolute: 439 cells/uL (ref 15–500)
Eosinophils Relative: 5.7 %
HCT: 47.8 % — ABNORMAL HIGH (ref 35.0–45.0)
Hemoglobin: 15.9 g/dL — ABNORMAL HIGH (ref 11.7–15.5)
Lymphs Abs: 1794 cells/uL (ref 850–3900)
MCH: 32.5 pg (ref 27.0–33.0)
MCHC: 33.3 g/dL (ref 32.0–36.0)
MCV: 97.8 fL (ref 80.0–100.0)
MPV: 11.1 fL (ref 7.5–12.5)
Monocytes Relative: 12.7 %
Neutro Abs: 4366 cells/uL (ref 1500–7800)
Neutrophils Relative %: 56.7 %
Platelets: 264 10*3/uL (ref 140–400)
RBC: 4.89 10*6/uL (ref 3.80–5.10)
RDW: 14.2 % (ref 11.0–15.0)
Total Lymphocyte: 23.3 %
WBC: 7.7 10*3/uL (ref 3.8–10.8)

## 2019-09-23 NOTE — Progress Notes (Signed)
CMP WNL.  Hgb and hct are borderline elevated but stable.  Absolute monocytes are borderline elevated but WBC count is WNL. We will continue to monitor.

## 2019-09-30 ENCOUNTER — Ambulatory Visit: Payer: Medicare Other | Admitting: Physician Assistant

## 2019-10-05 DIAGNOSIS — H04123 Dry eye syndrome of bilateral lacrimal glands: Secondary | ICD-10-CM | POA: Diagnosis not present

## 2019-10-05 DIAGNOSIS — M3500 Sicca syndrome, unspecified: Secondary | ICD-10-CM | POA: Diagnosis not present

## 2019-10-05 DIAGNOSIS — H524 Presbyopia: Secondary | ICD-10-CM | POA: Diagnosis not present

## 2019-10-05 DIAGNOSIS — H5203 Hypermetropia, bilateral: Secondary | ICD-10-CM | POA: Diagnosis not present

## 2019-10-05 DIAGNOSIS — H25813 Combined forms of age-related cataract, bilateral: Secondary | ICD-10-CM | POA: Diagnosis not present

## 2019-10-05 DIAGNOSIS — Z79899 Other long term (current) drug therapy: Secondary | ICD-10-CM | POA: Diagnosis not present

## 2019-10-05 DIAGNOSIS — H52223 Regular astigmatism, bilateral: Secondary | ICD-10-CM | POA: Diagnosis not present

## 2019-10-21 ENCOUNTER — Ambulatory Visit: Payer: Medicare Other | Admitting: Podiatry

## 2019-10-24 DIAGNOSIS — G4733 Obstructive sleep apnea (adult) (pediatric): Secondary | ICD-10-CM | POA: Diagnosis not present

## 2019-11-03 ENCOUNTER — Other Ambulatory Visit: Payer: Self-pay

## 2019-11-03 DIAGNOSIS — M0579 Rheumatoid arthritis with rheumatoid factor of multiple sites without organ or systems involvement: Secondary | ICD-10-CM

## 2019-11-03 MED ORDER — METHOTREXATE SODIUM CHEMO INJECTION 50 MG/2ML: INTRAMUSCULAR | 0 refills | Status: DC

## 2019-11-03 MED ORDER — ORENCIA 125 MG/ML ~~LOC~~ SOSY: PREFILLED_SYRINGE | SUBCUTANEOUS | 2 refills | Status: DC

## 2019-11-03 NOTE — Telephone Encounter (Signed)
Patient called requesting prescription refill of Methotrexate to be sent to [place] to be sent to [facility].

## 2019-11-03 NOTE — Telephone Encounter (Signed)
TB Gold: 04/11/2019  Okay to refill Orencia?

## 2019-11-03 NOTE — Addendum Note (Signed)
Addended by: Carole Binning on: 11/03/2019 12:48 PM   Modules accepted: Orders

## 2019-11-03 NOTE — Telephone Encounter (Signed)
Last Visit: 09/22/2019 Next Visit: 02/23/2020 Labs: 09/22/2019 CMP WNL. Hgb and hct are borderline elevated but stable. Absolute monocytes are borderline elevated but WBC count is WNL.   Current Dose per office note 09/22/2019: methotrexate 0.8 ml sq injections once weekly DX: Rheumatoid arthritis involving multiple sites with positive rheumatoid factor   Okay to refill MTX?

## 2019-12-15 ENCOUNTER — Other Ambulatory Visit: Payer: Self-pay | Admitting: *Deleted

## 2019-12-15 DIAGNOSIS — M810 Age-related osteoporosis without current pathological fracture: Secondary | ICD-10-CM

## 2019-12-15 DIAGNOSIS — Z5181 Encounter for therapeutic drug level monitoring: Secondary | ICD-10-CM

## 2019-12-16 LAB — CBC WITH DIFFERENTIAL/PLATELET
Absolute Monocytes: 890 cells/uL (ref 200–950)
Basophils Absolute: 98 cells/uL (ref 0–200)
Basophils Relative: 1.1 %
Eosinophils Absolute: 329 cells/uL (ref 15–500)
Eosinophils Relative: 3.7 %
HCT: 48 % — ABNORMAL HIGH (ref 35.0–45.0)
Hemoglobin: 16 g/dL — ABNORMAL HIGH (ref 11.7–15.5)
Lymphs Abs: 2261 cells/uL (ref 850–3900)
MCH: 31.9 pg (ref 27.0–33.0)
MCHC: 33.3 g/dL (ref 32.0–36.0)
MCV: 95.6 fL (ref 80.0–100.0)
MPV: 10.9 fL (ref 7.5–12.5)
Monocytes Relative: 10 %
Neutro Abs: 5322 cells/uL (ref 1500–7800)
Neutrophils Relative %: 59.8 %
Platelets: 256 10*3/uL (ref 140–400)
RBC: 5.02 10*6/uL (ref 3.80–5.10)
RDW: 13.5 % (ref 11.0–15.0)
Total Lymphocyte: 25.4 %
WBC: 8.9 10*3/uL (ref 3.8–10.8)

## 2019-12-16 LAB — COMPLETE METABOLIC PANEL WITH GFR
AG Ratio: 1.6 (calc) (ref 1.0–2.5)
ALT: 25 U/L (ref 6–29)
AST: 25 U/L (ref 10–35)
Albumin: 4.1 g/dL (ref 3.6–5.1)
Alkaline phosphatase (APISO): 77 U/L (ref 37–153)
BUN: 19 mg/dL (ref 7–25)
CO2: 27 mmol/L (ref 20–32)
Calcium: 9.3 mg/dL (ref 8.6–10.4)
Chloride: 103 mmol/L (ref 98–110)
Creat: 0.75 mg/dL (ref 0.60–0.93)
GFR, Est African American: 91 mL/min/{1.73_m2} (ref >=60)
GFR, Est Non African American: 79 mL/min/{1.73_m2} (ref >=60)
Globulin: 2.6 g/dL (calc) (ref 1.9–3.7)
Glucose, Bld: 90 mg/dL (ref 65–99)
Potassium: 4.4 mmol/L (ref 3.5–5.3)
Sodium: 138 mmol/L (ref 135–146)
Total Bilirubin: 0.4 mg/dL (ref 0.2–1.2)
Total Protein: 6.7 g/dL (ref 6.1–8.1)

## 2019-12-16 NOTE — Progress Notes (Signed)
CBC and CMP are stable.  Hemoglobin stays elevated.

## 2020-01-07 HISTORY — PX: MOUTH SURGERY: SHX715

## 2020-01-11 ENCOUNTER — Other Ambulatory Visit: Payer: Self-pay

## 2020-01-11 DIAGNOSIS — M0579 Rheumatoid arthritis with rheumatoid factor of multiple sites without organ or systems involvement: Secondary | ICD-10-CM

## 2020-01-11 MED ORDER — ORENCIA 125 MG/ML ~~LOC~~ SOSY: PREFILLED_SYRINGE | SUBCUTANEOUS | 2 refills | Status: DC

## 2020-01-11 NOTE — Telephone Encounter (Signed)
Last Visit: 09/22/2019 Next Visit: 02/23/2020 Labs: 12/15/2019, CBC and CMP are stable. Hemoglobin stays elevated. TB Gold: 04/11/2019 negative  Current Dose per office note 09/22/2019: She will continue on Orencia as prescribed  Rheumatoid arthritis involving multiple sites with positive rheumatoid factor  DX:  Okay to refill Orencia?

## 2020-01-11 NOTE — Telephone Encounter (Signed)
Patient left a voicemail requesting prescription refill of Orencia to be sent to [facility].    Patient also stated that she has not received the booster vaccine yet and is not sure she will get it since she had both Pfizer vaccines, as well as having COVID.

## 2020-01-31 ENCOUNTER — Other Ambulatory Visit: Payer: Self-pay

## 2020-01-31 MED ORDER — ZOLPIDEM TARTRATE 10 MG PO TABS: 5.0000 mg | ORAL_TABLET | Freq: Every evening | ORAL | 2 refills | Status: DC | PRN

## 2020-01-31 NOTE — Telephone Encounter (Signed)
Patient called requesting prescription refill of Zolpidem to be sent to [facility].   

## 2020-01-31 NOTE — Telephone Encounter (Signed)
Last Visit: 09/22/2019 Next Visit: 2/17/222  Current Dose per office note on 09/22/2019,   zolpidem (AMBIEN) 10 MG tablet    Sig: Take 0.5 tablets (5 mg total) by mouth at bedtime as needed for sleep.    Dispense:  30 tablet    Refill:  0    Dx: Rheumatoid arthritis involving multiple sites with positive rheumatoid factor  Okay to refill Zolpidem?

## 2020-02-09 NOTE — Progress Notes (Signed)
Office Visit Note  Patient: Deanna Norris             Date of Birth: [birth year]           MRN: [id number]             PCP: Judy Pimple, MD Referring: Tower, Audrie Gallus, MD Visit Date: 02/23/2020 Occupation: @GUAROCC @  Subjective:  Pain in both knees and both feet   History of Present Illness: [name] is a 75 y.o. female with history of seropositive rheumatoid arthritis, osteoarthritis, and osteoporosis.  She is prescribed orencia 125 mg sq injections once weekly and injectable MTX. She has been injecting MTX 0.8 ml sq weekly for the past 2 weeks but was previously injecting 0.6 ml once weekly.  She has been experiencing increased pain and stiffness in both knee joints and both feet. She denies any joint swelling.  She would like to reapply for Euflexxa injections for both knees.  She had an inadequate response to Orthovisc injections in March/April 2021.  The discomfort in her feet has been on the dorsal aspect and is usually worse if she is not wearing supportive shoes or when starting to walk after a prolonged period of time. She has been taking ibuprofen as needed for pain relief. Patient ports that she continues to experience shortness of breath on exertion.  She states that she has been trying to walk on a regular basis for exercise but has difficulty keeping up with her friend at times.  She was evaluated by Dr. Isaiah Serge in October 2020 but has not followed back up for further evaluation. She has also been experiencing increased fatigue recently. She has been sleeping well at night.  She uses a CPAP and takes ambien 10 mg 1/2 tablet by mouth at bedtime as needed for insomnia. She continues to have chronic sicca symptoms.  She uses ACT mouthwash and restasis eyedrops for symptomatic relief.  She continues to see her dentist every 6 months and denies any dental caries.  She has an upcoming appointment with her ophthalmologist this summer.  She denies any recent falls or fractures.  She is due for her next reclast infusion in July 2022.  She continues to take a calcium and vitamin D supplement daily.   She denies any recent infections.   Activities of Daily Living:  Patient reports morning stiffness for 5-10 minutes.   Patient Reports nocturnal pain.  Difficulty dressing/grooming: Denies Difficulty climbing stairs: Reports Difficulty getting out of chair: Reports Difficulty using hands for taps, buttons, cutlery, and/or writing: Denies  Review of Systems  Constitutional: Positive for fatigue.  HENT: Positive for mouth dryness and nose dryness. Negative for mouth sores.   Eyes: Positive for pain, itching and dryness.  Respiratory: Positive for shortness of breath. Negative for cough, wheezing and difficulty breathing.   Cardiovascular: Negative for chest pain and palpitations.  Gastrointestinal: Positive for constipation. Negative for blood in stool and diarrhea.  Endocrine: Negative for increased urination.  Genitourinary: Negative for difficulty urinating and painful urination.  Musculoskeletal: Positive for arthralgias, joint pain and morning stiffness. Negative for joint swelling, myalgias, muscle tenderness and myalgias.  Skin: Negative for color change, rash and redness.  Allergic/Immunologic: Negative for susceptible to infections.  Neurological: Negative for dizziness, numbness, headaches, memory loss and weakness.  Hematological: Positive for bruising/bleeding tendency.  Psychiatric/Behavioral: Negative for depressed mood and confusion. The patient is not nervous/anxious.     PMFS History:  Patient Active Problem List  Diagnosis Date Noted  . Hearing loss 07/10/2019  . Allergic rhinitis 04/08/2018  . Immunocompromised ([place]) 02/17/2018  . 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 Primary osteoarthritis of both hands 01/11/2016  . Primary  osteoarthritis of both knees 01/11/2016  . High risk medication use 11/15/2015  . Sjogren's syndrome ([place]) 10/31/2015  . Chronic constipation 10/23/2015  . Rosacea 10/23/2015  . Rheumatoid arthritis ([place]) 03/29/2015  . Coronary artery calcification seen on CT scan 11/17/2013  . Recurrent sinusitis 09/16/2013  . Encounter for Medicare annual wellness exam 01/31/2013  . Dyspnea on exertion 09/14/2012  . Nodule of right lung 09/14/2012  . Hyperlipidemia 05/08/2009  . 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 Asthma 05/08/2009  . GERD 05/08/2009  . MENOPAUSAL DISORDER 05/08/2009  . ARTHRITIS, RHEUMATOID 05/08/2009  . Osteoporosis 05/08/2009  . SPONDYLOLISTHESIS 05/08/2009  . Sleep apnea 05/08/2009    Past Medical History:  Diagnosis Date  . Asthma   . Constipation   . Fibroid   . GERD (gastroesophageal reflux disease)   . Insomnia   . Lumbar vertebral fracture (HCC)    L5-S1  . Menopausal symptoms   . Osteoporosis   . Postmenopausal HRT (hormone replacement therapy)   . RA (rheumatoid arthritis) ([place]) 11/2004   methotrexate and Embrel  . Rosacea   . Sjogren's disease ([place])   . Sleep apnea 2008   C-Pap  . Vitamin D deficiency     Family History  Problem Relation Age of Onset  . Breast cancer Sister 64  . Thyroid disease Sister   . Cancer Sister 58       Leukemia--CLL  . Heart disease Brother    Past Surgical History:  Procedure Laterality Date  . ABDOMINAL HYSTERECTOMY  01/1993   partial with AP repair, secondary to fibroids  . BREAST SURGERY  1991   breast biopsy  . CYSTOCELE REPAIR  10/2007   with graft  . MELANOMA EXCISION Right 01/2012   excision right posterior arm  . NASAL SINUS SURGERY  07/1991  . VAGINAL DELIVERY     x3   Social History   Social History Narrative  . Not on file   Immunization History  Administered Date(s) Administered  . DTaP 04/06/2013  . Influenza Split 10/22/2010  . Influenza Whole 10/10/2009, 10/20/2011  . Influenza, High Dose  Seasonal PF 10/14/2016, 10/08/2018  . Influenza, Seasonal, Injecte, Preservative Fre 10/06/2012  . Influenza,inj,Quad PF,6+ Mos 10/30/2014  . Influenza,inj,quad, With Preservative 10/12/2017  . Influenza-Unspecified 10/26/2013, 10/22/2015  . PFIZER(Purple Top)SARS-COV-2 Vaccination 06/02/2019, 08/26/2019  . Pneumococcal Conjugate-13 02/13/2014  . Pneumococcal Polysaccharide-23 06/12/2010, 03/28/2015  . Td 02/07/2003, 03/23/2013  . Tdap 03/23/2013  . Zoster 05/08/2014     Objective: Vital Signs: BP 123/72 (BP Location: Left Arm, Patient Position: Sitting, Cuff Size: Normal)   Pulse 63   Resp 16   Ht 5\' 7"  (1.702 m)   Wt 181 lb 3.2 oz (82.2 kg)   LMP 01/06/1993   BMI 28.38 kg/m    Physical Exam Vitals and nursing note reviewed.  Constitutional:      Appearance: She is well-developed and well-nourished.  HENT:     Head: Normocephalic and atraumatic.  Eyes:     Extraocular Movements: EOM normal.     Conjunctiva/sclera: Conjunctivae normal.  Cardiovascular:     Pulses: Intact distal pulses.  Pulmonary:     Effort: Pulmonary effort is normal.  Abdominal:  Palpations: Abdomen is soft.  Musculoskeletal:     Cervical back: Normal range of motion.  Skin:    General: Skin is warm and dry.     Capillary Refill: Capillary refill takes less than 2 seconds.  Neurological:     Mental Status: She is alert and oriented to person, place, and time.  Psychiatric:        Mood and Affect: Mood and affect normal.        Behavior: Behavior normal.      Musculoskeletal Exam: C-spine, thoracic spine, and lumbar spine good ROM.  Shoulder joints, elbow joints, wrist joints, MCPs, PIPs, and DIPs good ROM with no synovitis.  Synovial thickening of MCP joints, especially bilateral 2nd and 3rd MCP joints.  Complete fist formation bilaterally.  PIP and DIP thickening consistent with osteoarthritis.  Hip joints good ROM with no discomfort.  Knee joints good ROM with no warmth or effusion.  Ankle  joints good ROM with no tenderness or inflammation.  Tenderness on the dorsal aspect of both feet. No dorsal spurs noted.  No tenderness of MTP joints.  PIP and DIP thickening consistent with osteoarthritis of both feet.  No achilles tendonitis or plantar fasciitis.     CDAI Exam: CDAI Score: 0.4  Patient Global: 2 mm; Provider Global: 2 mm Swollen: 0 ; Tender: 2  Joint Exam 02/23/2020      Right  Left  Tarsometatarsal   Tender   Tender   There is currently no information documented on the homunculus. Go to the Rheumatology activity and complete the homunculus joint exam.  Investigation: No additional findings.  Imaging: No results found.  Recent Labs: Lab Results  Component Value Date   WBC 8.9 12/15/2019   HGB 16.0 (H) 12/15/2019   PLT 256 12/15/2019   NA 138 12/15/2019   K 4.4 12/15/2019   CL 103 12/15/2019   CO2 27 12/15/2019   GLUCOSE 90 12/15/2019   BUN 19 12/15/2019   CREATININE 0.75 12/15/2019   BILITOT 0.4 12/15/2019   ALKPHOS 68 06/16/2019   AST 25 12/15/2019   ALT 25 12/15/2019   PROT 6.7 12/15/2019   ALBUMIN 3.9 06/16/2019   CALCIUM 9.3 12/15/2019   GFRAA 91 12/15/2019   QFTBGOLDPLUS NEGATIVE 04/11/2019    Speciality Comments: Prior therapy: Plaquenil (allergy), Humira and Enbrel (inadequate response)  Procedures:  No procedures performed Allergies: Adalimumab, Diclofenac, Dicyclomine, Doxycycline hyclate, Enbrel [etanercept], Fosamax [alendronate sodium], Methotrexate derivatives, Nickel, Piroxicam, Salagen [pilocarpine hcl], Sulfamethoxazole-trimethoprim, Tramadol hcl, and Plaquenil [hydroxychloroquine]   Assessment / Plan:     Visit Diagnoses: Rheumatoid arthritis involving multiple sites with positive rheumatoid factor (HCC) - +RF, +CCP, ANA-,severe erosive: She has no synovitis on exam.  She has not had any recent rheumatoid arthritis flares.  X-rays of both hands and feet were updated on 02/02/2018 which did not reveal any radiographic change at  that time.  She has synovial thickening of MCP joints but no synovitis or joint tenderness on exam.  She is clinically doing well on Orencia 125 mg subcutaneous injections once weekly, methotrexate 0.6 mL sq injections once weekly, and folic acid 2 mg by mouth daily.  For the past 2 weeks she increased the dose of methotrexate to 0.8 mL injections/weekly due to experiencing increased discomfort in her feet as well as increased fatigue.  Her discomfort seems to be due to underlying osteoarthritic changes in both knees and both feet.  We will be reapplying for Euflexxa injections for both knees.  She was  also encouraged to go to shoe market to be fitted for new orthotics with arch support.  She was advised to reduce the dose of methotrexate back to 0.6 mL sq injections once weekly and she will continue on Orencia 125 mg subcutaneous injections once weekly.  She was advised to notify us if she develops increased joint pain or joint swelling.  Follow-up in the office in 5 months.  High risk medication use - Orencia 125 mg sq injections every 7 days, methotrexate 0.6 ml sq every 7 days, and folic acid 1 mg 2 tablets daily.  CBC and CMP were updated on 12/15/2019.  She will be due to update lab work in March and every 3 months to monitor for drug toxicity.  Standing orders for CBC and CMP were placed today.  TB gold negative on 04/11/2019.  Future order for TB gold was placed today.- Plan: QuantiFERON-TB Gold Plus, CBC with Differential/Platelet, COMPLETE METABOLIC PANEL WITH GFR She has not had any recent infections.  She was advised to hold Orencia and methotrexate if she develops signs or symptoms of an infection and to resume once infection has completely cleared. She has received 2 Pfizer COVID-19 vaccinations.  Screening for tuberculosis -Future order for TB gold placed today.  Plan: QuantiFERON-TB Gold Plus  Sicca syndrome ([place]): She has ongoing sicca symptoms. She has been using ACT mouthwash daily and  restasis eyedrops daily for symptomatic relief.  She has not had any recent dental caries and continues to follow up with her dentist every 6 months.  She has routine follow ups with her ophthalmologist as well. Discussed the use of a humidifier in her home.   Primary osteoarthritis of both hands: She has PIP and DIP thickening consistent with osteoarthritis of both hands.  CMC joint prominence noted bilaterally.  She is able to make a complete fist bilaterally.  Joint protection and muscle strengthening were discussed.  Primary osteoarthritis of both knees: She has good range of motion of both knee joints on examination today.  No warmth or effusion was noted.  X-rays of both knees were updated on 03/28/2019 which were consistent with moderate to severe osteoarthritis and severe chondromalacia patella.  In the past she has had great response to Euflexxa Visco gel injections.  Her insurance would only approve Orthovisc injections in March/April 2021, which were not effective.  She has had increased pain climbing steps as well as rising from a seated position.  She would like to reapply for Euflexxa injections for both knees.   Primary osteoarthritis of both feet: She has PIP and DIP thickening consistent with osteoarthritis of both feet.  She has been experiencing increased pain on the dorsal aspect of both feet which is exacerbated by not wearing supportive shoes as well as starting to walk after sitting for prolonged periods of time.  No dorsal spur or inflammation was noted on exam.  She has plantar fasciitis or Achilles tendinitis.  No tenderness or synovitis of MTP joints.  X-rays of both feet were obtained on 02/02/2018 and results were reviewed with the patient today in the office.  She declined updated x-rays today.  We discussed going to shoe market to be fitted for orthotics with arch support.  If her discomfort persists or worsens we discussed following up with her podiatrist for further evaluation.    Spondylosis of lumbar region without myelopathy or radiculopathy: She experiences intermittent lower back pain.  No symptoms of radiculopathy.   DDD (degenerative disc disease), lumbar: She has  occasional discomfort in her lower back.  No symptoms of radiculopathy.   Age-related osteoporosis without current pathological fracture - DEXA 02/07/2019 Lowest T-score and site measured: Left Femoral Neck -2.9.  Her most recent Reclast infusion was on 07/20/2019.  She will be due for the next reclast infusion in July 2022.  She continues to take a calcium and vitamin D supplement daily.  She has not had any recent falls or fractures.   SOB (shortness of breath) on exertion: Previous pt of Dr. [name] in 2015.  Re-established care at [facility] pulmonary on 10/29/18 with Dr. Vaughan Browner.  She also underwent a thorough work-up by cardiology including an echocardiogram on 12/21/2018.  She has been experiencing shortness of breath on exertion since 2019.  She underwent a high-resolution chest CT in July 2020 which revealed very mild areas of ground glass attenuation in the lung bases bilaterally.  No other imaging findings suggest ILD.  The possibly of very early disease was not entirely excluded-repeat HR chest CT in 12 months if persistent clinical concern recommended.   Due to her history of RA and Sjogren's,  persistence of shortness of breath on exertion, and  worsening fatigue we will repeat the high-resolution chest CT.  Order placed today. She was also encouraged to schedule an appointment with Dr. [name] for further evaluation and repeat.   Other medical conditions are listed as follows:   History of rosacea  History of gastroesophageal reflux (GERD)  History of hyperlipidemia  History of sleep apnea: She uses a CPAP nightly.   Abnormal SPEP  Orders: Orders Placed This Encounter  Procedures  . CT Chest High Resolution  . QuantiFERON-TB Gold Plus  . CBC with Differential/Platelet  . COMPLETE  METABOLIC PANEL WITH GFR   Meds ordered this encounter  Medications  . methotrexate 50 MG/2ML injection    Sig: Inject 0.6 mLs into the skin once a week.    Dispense:  8 mL    Refill:  0     Follow-Up Instructions: Return in about 5 months (around 07/22/2020) for Rheumatoid arthritis, Osteoarthritis, Osteoporosis.   Ofilia Neas, PA-C  Note - This record has been created using Dragon software.  Chart creation errors have been sought, but may not always  have been located. Such creation errors do not reflect on  the standard of medical care.

## 2020-02-17 ENCOUNTER — Encounter: Payer: Self-pay | Admitting: Family Medicine

## 2020-02-17 DIAGNOSIS — Z1231 Encounter for screening mammogram for malignant neoplasm of breast: Secondary | ICD-10-CM | POA: Diagnosis not present

## 2020-02-17 DIAGNOSIS — Z803 Family history of malignant neoplasm of breast: Secondary | ICD-10-CM | POA: Diagnosis not present

## 2020-02-23 ENCOUNTER — Other Ambulatory Visit: Payer: Self-pay

## 2020-02-23 ENCOUNTER — Encounter: Payer: Self-pay | Admitting: Physician Assistant

## 2020-02-23 ENCOUNTER — Ambulatory Visit (INDEPENDENT_AMBULATORY_CARE_PROVIDER_SITE_OTHER): Payer: Medicare Other | Admitting: Physician Assistant

## 2020-02-23 ENCOUNTER — Telehealth: Payer: Self-pay | Admitting: Pulmonary Disease

## 2020-02-23 ENCOUNTER — Telehealth: Payer: Self-pay

## 2020-02-23 VITALS — BP 123/72 | HR 63 | Resp 16 | Ht 67.0 in | Wt 181.2 lb

## 2020-02-23 DIAGNOSIS — M0579 Rheumatoid arthritis with rheumatoid factor of multiple sites without organ or systems involvement: Secondary | ICD-10-CM | POA: Diagnosis not present

## 2020-02-23 DIAGNOSIS — R0602 Shortness of breath: Secondary | ICD-10-CM | POA: Diagnosis not present

## 2020-02-23 DIAGNOSIS — Z872 Personal history of diseases of the skin and subcutaneous tissue: Secondary | ICD-10-CM

## 2020-02-23 DIAGNOSIS — M19071 Primary osteoarthritis, right ankle and foot: Secondary | ICD-10-CM | POA: Diagnosis not present

## 2020-02-23 DIAGNOSIS — Z111 Encounter for screening for respiratory tuberculosis: Secondary | ICD-10-CM

## 2020-02-23 DIAGNOSIS — M19041 Primary osteoarthritis, right hand: Secondary | ICD-10-CM | POA: Diagnosis not present

## 2020-02-23 DIAGNOSIS — M47816 Spondylosis without myelopathy or radiculopathy, lumbar region: Secondary | ICD-10-CM | POA: Diagnosis not present

## 2020-02-23 DIAGNOSIS — M810 Age-related osteoporosis without current pathological fracture: Secondary | ICD-10-CM

## 2020-02-23 DIAGNOSIS — Z8669 Personal history of other diseases of the nervous system and sense organs: Secondary | ICD-10-CM

## 2020-02-23 DIAGNOSIS — M3500 Sicca syndrome, unspecified: Secondary | ICD-10-CM | POA: Diagnosis not present

## 2020-02-23 DIAGNOSIS — Z79899 Other long term (current) drug therapy: Secondary | ICD-10-CM | POA: Diagnosis not present

## 2020-02-23 DIAGNOSIS — M170 Bilateral primary osteoarthritis of knee: Secondary | ICD-10-CM

## 2020-02-23 DIAGNOSIS — Z8639 Personal history of other endocrine, nutritional and metabolic disease: Secondary | ICD-10-CM

## 2020-02-23 DIAGNOSIS — M19042 Primary osteoarthritis, left hand: Secondary | ICD-10-CM

## 2020-02-23 DIAGNOSIS — M5136 Other intervertebral disc degeneration, lumbar region: Secondary | ICD-10-CM | POA: Diagnosis not present

## 2020-02-23 DIAGNOSIS — Z8719 Personal history of other diseases of the digestive system: Secondary | ICD-10-CM

## 2020-02-23 DIAGNOSIS — R778 Other specified abnormalities of plasma proteins: Secondary | ICD-10-CM

## 2020-02-23 DIAGNOSIS — M19072 Primary osteoarthritis, left ankle and foot: Secondary | ICD-10-CM

## 2020-02-23 MED ORDER — METHOTREXATE SODIUM CHEMO INJECTION 50 MG/2ML: INTRAMUSCULAR | 0 refills | Status: DC

## 2020-02-23 NOTE — Telephone Encounter (Signed)
Called and spoke with pt letting her know that Dr. Vaughan Browner was fine with her switching to MR. appt has been scheduled for pt as new pt with MR. Nothing further needed.

## 2020-02-23 NOTE — Patient Instructions (Signed)
Standing Labs We placed an order today for your standing lab work.   Please have your standing labs drawn in March and every 3 months   If possible, please have your labs drawn 2 weeks prior to your appointment so that the provider can discuss your results at your appointment.  We have open lab daily Monday through Thursday from 1:30-4:30 PM and Friday from 1:30-4:00 PM at the office of Dr. Shaili Deveshwar,  Rheumatology.   Please be advised, all patients with office appointments requiring lab work will take precedents over walk-in lab work.  If possible, please come for your lab work on Monday and Friday afternoons, as you may experience shorter wait times. The office is located at [address], [address], [place], [place] [address] No appointment is necessary.   Labs are drawn by [facility]. Please bring your co-pay at the time of your lab draw.  You may receive a bill from [facility] for your lab work.  If you wish to have your labs drawn at another location, please call the office 24 hours in advance to send orders.  If you have any questions regarding directions or hours of operation,  please call [phone].   As a reminder, please drink plenty of water prior to coming for your lab work. Thanks!   

## 2020-02-23 NOTE — Telephone Encounter (Signed)
Ok with me 

## 2020-02-23 NOTE — Telephone Encounter (Signed)
PM and MR please advise if you are ok with pt changing providers.  Thanks

## 2020-02-23 NOTE — Telephone Encounter (Signed)
Please apply for bilateral knee euflexxa per Hazel Sams, PA-C. Patient had inadequate response to orthovisc. Thanks!

## 2020-03-05 ENCOUNTER — Ambulatory Visit (HOSPITAL_COMMUNITY)
Admission: RE | Admit: 2020-03-05 | Discharge: 2020-03-05 | Disposition: A | Discharge status: Home / Self Care | Payer: Medicare Other | Source: Ambulatory Visit | Attending: Physician Assistant | Admitting: Physician Assistant

## 2020-03-05 ENCOUNTER — Other Ambulatory Visit: Payer: Self-pay

## 2020-03-05 DIAGNOSIS — I2510 Atherosclerotic heart disease of native coronary artery without angina pectoris: Secondary | ICD-10-CM | POA: Diagnosis not present

## 2020-03-05 DIAGNOSIS — Z8739 Personal history of other diseases of the musculoskeletal system and connective tissue: Secondary | ICD-10-CM | POA: Diagnosis not present

## 2020-03-05 DIAGNOSIS — I708 Atherosclerosis of other arteries: Secondary | ICD-10-CM | POA: Diagnosis not present

## 2020-03-05 DIAGNOSIS — J929 Pleural plaque without asbestos: Secondary | ICD-10-CM | POA: Diagnosis not present

## 2020-03-05 DIAGNOSIS — R0602 Shortness of breath: Secondary | ICD-10-CM | POA: Insufficient documentation

## 2020-03-05 NOTE — Telephone Encounter (Signed)
Submitted for VOB 03/05/20

## 2020-03-06 NOTE — Progress Notes (Signed)
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  Recommend repeat HR chest CT in 12 months.   Please advise the patient to call [facility] to schedule an appointment with Dr. [name].

## 2020-03-12 NOTE — Telephone Encounter (Signed)
Please call to schedule Visco knee injections.  Authorized for [employer] series Bilateral knees. Buy and Bill. No PA required. Deductible must be met before coverage applies. (as of date $107.00 out of $233.00 has been met.) Insurance to cover 100% of allowable cost post deductible. No copay required.

## 2020-03-15 NOTE — Telephone Encounter (Signed)
I spoke with patient about benefits for Visco. Patient will call back to schedule once she has her schedule in front of her.

## 2020-03-16 ENCOUNTER — Other Ambulatory Visit: Payer: Self-pay | Admitting: *Deleted

## 2020-03-16 DIAGNOSIS — Z111 Encounter for screening for respiratory tuberculosis: Secondary | ICD-10-CM | POA: Diagnosis not present

## 2020-03-16 DIAGNOSIS — M810 Age-related osteoporosis without current pathological fracture: Secondary | ICD-10-CM

## 2020-03-16 DIAGNOSIS — Z5181 Encounter for therapeutic drug level monitoring: Secondary | ICD-10-CM

## 2020-03-16 DIAGNOSIS — Z79899 Other long term (current) drug therapy: Secondary | ICD-10-CM | POA: Diagnosis not present

## 2020-03-19 ENCOUNTER — Ambulatory Visit: Payer: Medicare Other | Admitting: Physician Assistant

## 2020-03-19 LAB — COMPLETE METABOLIC PANEL WITH GFR
AG Ratio: 1.8 (calc) (ref 1.0–2.5)
ALT: 28 U/L (ref 6–29)
AST: 27 U/L (ref 10–35)
Albumin: 3.9 g/dL (ref 3.6–5.1)
Alkaline phosphatase (APISO): 94 U/L (ref 37–153)
BUN: 12 mg/dL (ref 7–25)
CO2: 30 mmol/L (ref 20–32)
Calcium: 9.6 mg/dL (ref 8.6–10.4)
Chloride: 105 mmol/L (ref 98–110)
Creat: 0.69 mg/dL (ref 0.60–0.93)
GFR, Est African American: 99 mL/min/{1.73_m2} (ref >=60)
GFR, Est Non African American: 86 mL/min/{1.73_m2} (ref >=60)
Globulin: 2.2 g/dL (calc) (ref 1.9–3.7)
Glucose, Bld: 118 mg/dL — ABNORMAL HIGH (ref 65–99)
Potassium: 4.4 mmol/L (ref 3.5–5.3)
Sodium: 141 mmol/L (ref 135–146)
Total Bilirubin: 0.4 mg/dL (ref 0.2–1.2)
Total Protein: 6.1 g/dL (ref 6.1–8.1)

## 2020-03-19 LAB — QUANTIFERON-TB GOLD PLUS
Mitogen-NIL: >10 IU/mL
NIL: 0.03 IU/mL
QuantiFERON-TB Gold Plus: NEGATIVE
TB1-NIL: 0 IU/mL
TB2-NIL: 0 IU/mL

## 2020-03-19 LAB — CBC WITH DIFFERENTIAL/PLATELET
Absolute Monocytes: 838 cells/uL (ref 200–950)
Basophils Absolute: 108 cells/uL (ref 0–200)
Basophils Relative: 1.3 %
Eosinophils Absolute: 481 cells/uL (ref 15–500)
Eosinophils Relative: 5.8 %
HCT: 46 % — ABNORMAL HIGH (ref 35.0–45.0)
Hemoglobin: 15.9 g/dL — ABNORMAL HIGH (ref 11.7–15.5)
Lymphs Abs: 2440 cells/uL (ref 850–3900)
MCH: 32.9 pg (ref 27.0–33.0)
MCHC: 34.6 g/dL (ref 32.0–36.0)
MCV: 95 fL (ref 80.0–100.0)
MPV: 11.1 fL (ref 7.5–12.5)
Monocytes Relative: 10.1 %
Neutro Abs: 4432 cells/uL (ref 1500–7800)
Neutrophils Relative %: 53.4 %
Platelets: 266 10*3/uL (ref 140–400)
RBC: 4.84 10*6/uL (ref 3.80–5.10)
RDW: 13.9 % (ref 11.0–15.0)
Total Lymphocyte: 29.4 %
WBC: 8.3 10*3/uL (ref 3.8–10.8)

## 2020-03-19 LAB — VITAMIN D 25 HYDROXY (VIT D DEFICIENCY, FRACTURES): Vit D, 25-Hydroxy: 51 ng/mL (ref 30–100)

## 2020-03-19 NOTE — Progress Notes (Signed)
TB gold negative

## 2020-03-19 NOTE — Progress Notes (Signed)
Glucose is 118.  Rest of CMP WNL.  Hgb and hct are borderline elevated. Rest of CBC WNL.

## 2020-03-20 NOTE — Progress Notes (Signed)
Vitamin D is normal

## 2020-03-28 ENCOUNTER — Ambulatory Visit: Payer: Medicare Other | Admitting: Obstetrics and Gynecology

## 2020-03-29 NOTE — Progress Notes (Signed)
75 y.o. G45P3003 Married Caucasian female here for annual exam.   Dealing with some shortness of breath and having some foot pain.  Did a CT scan and will see a pulmonologist this week.   Needs refill on Vagifem.  Helps to treat vaginal dryness.   Bladder control is good.  Some constipation in past and treating with magnesium.  No fecal incontinence.   Grandson 34 yo with leukemia and doing treatment.   PCP:  Loura Pardon, MD   Patient's last menstrual period was 01/06/1993.           Sexually active: Yes.   Husband with Prostate CA The current method of family planning is status post hysterectomy.    Exercising: Yes.    walking Smoker:  no  Health Maintenance: Pap: 10-18-14 Neg  History of abnormal Pap:  no MMG: 02/2020 normal with [facility] Colonoscopy: 01-12-13 normal;next 01/2023 BMD: 02-07-19  Result :Osteoporosis TDaP:  03-13-13 Gardasil:   no HIV:no Hep C: no Screening Labs:  PCP   reports that she has never smoked. She has never used smokeless tobacco. She reports that she does not drink alcohol and does not use drugs.  Past Medical History:  Diagnosis Date  . Asthma   . Constipation   . Fibroid   . GERD (gastroesophageal reflux disease)   . Insomnia   . Lumbar vertebral fracture (HCC)    L5-S1  . Menopausal symptoms   . Osteoporosis   . Postmenopausal HRT (hormone replacement therapy)   . RA (rheumatoid arthritis) ([place]) 11/2004   methotrexate and Embrel  . Rosacea   . Sjogren's disease ([place])   . Sleep apnea 2008   C-Pap  . Vitamin D deficiency     Past Surgical History:  Procedure Laterality Date  . ABDOMINAL HYSTERECTOMY  01/1993   partial with AP repair, secondary to fibroids  . BREAST SURGERY  1991   breast biopsy  . CYSTOCELE REPAIR  10/2007   with graft  . MELANOMA EXCISION Right 01/2012   excision right posterior arm  . NASAL SINUS SURGERY  07/1991  . VAGINAL DELIVERY     x3    Current Outpatient Medications  Medication Sig Dispense Refill  .  acetaminophen (TYLENOL) 500 MG tablet Take 500 mg by mouth every 8 (eight) hours as needed.    Marland Kitchen albuterol (PROAIR HFA) 108 (90 Base) MCG/ACT inhaler Inhale 2 puffs into the lungs every 4 (four) hours as needed for wheezing or shortness of breath. 18 g 3  . Ascorbic Acid (VITAMIN C PO) Take 500 mg by mouth daily.    Marland Kitchen aspirin 81 MG tablet Take 81 mg by mouth daily.    . budesonide (PULMICORT) 180 MCG/ACT inhaler Inhale 1 puff into the lungs daily as needed.    . Calcium Carbonate-Vitamin D (CALCIUM 500/D PO) Take 1 tablet by mouth 2 (two) times daily.    . Cholecalciferol (VITAMIN D3) 2000 UNITS TABS Take 1 tablet by mouth daily.    . cycloSPORINE (RESTASIS) 0.05 % ophthalmic emulsion Place 1 drop into both eyes 2 (two) times daily.    Marland Kitchen desonide (DESOWEN) 0.05 % lotion Apply topically as needed.    . diclofenac Sodium (VOLTAREN) 1 % GEL Apply 2-4 grams to affected joint 4 times daily as needed. 400 g 2  . Estradiol (VAGIFEM) 10 MCG TABS vaginal tablet Place 1 tablet (10 mcg total) vaginally 2 (two) times a week. Use every night before bed for two weeks when you first begin  this medicine, then after the first two weeks, begin using it twice a week. 42 tablet 3  . famotidine (PEPCID) 40 MG tablet Take 1 tablet (40 mg total) by mouth 2 (two) times daily. 180 tablet 3  . fluticasone (FLONASE) 50 MCG/ACT nasal spray USE 2 SPRAYS IN EACH NOSTRIL DAILY AS NEEDED FOR RHINITIS OR ALLERGIES 48 g 3  . folic acid (FOLVITE) 1 MG tablet Take 2 tablets (2 mg total) by mouth daily. 180 tablet 3  . Ibuprofen 200 MG CAPS Take 3 capsules by mouth as needed.    . Magnesium Malate 1250 (141.7 Mg) MG TABS Take 3 tablets by mouth daily.     . Methotrexate Sodium (METHOTREXATE, PF,) 250 MG/10ML injection     . Multiple Vitamin (MULTI-VITAMINS) TABS Take 1 tablet by mouth daily.    . Multiple Vitamins-Minerals (ZINC PO) Take by mouth daily.    . Omega-3 1000 MG CAPS Take 1 tablet by mouth daily.    Marland Kitchen ORENCIA 125 MG/ML  SOSY INJECT THE CONTENTS OF 1 SYRINGE UNDER THE SKIN EVERY WEEK 4 mL 2  . Tuberculin-Allergy Syringes 27G X 1/2" 1 ML KIT Inject 1 Syringe into the skin once a week. To be used weekly with methotrexate.    . TURMERIC PO Take by mouth daily.    . Zinc 50 MG TABS     . zoledronic acid (RECLAST) 5 MG/100ML SOLN injection Inject 5 mg into the vein once. Takes yearly    . zolpidem (AMBIEN) 10 MG tablet Take 0.5 tablets (5 mg total) by mouth at bedtime as needed for sleep. 30 tablet 2   No current facility-administered medications for this visit.    Family History  Problem Relation Age of Onset  . Breast cancer Sister 45  . Thyroid disease Sister   . Cancer Sister 89       Leukemia--CLL  . Heart disease Brother     Review of Systems  All other systems reviewed and are negative.   Exam:   BP 118/64 (Cuff Size: Large)   Pulse 61   Ht 5' 5"  (1.651 m)   Wt 178 lb (80.7 kg)   LMP 01/06/1993   SpO2 96%   BMI 29.62 kg/m     General appearance: alert, cooperative and appears stated age Head: normocephalic, without obvious abnormality, atraumatic Lungs: clear to auscultation bilaterally Breasts: normal appearance, no masses or tenderness, No nipple retraction or dimpling, No nipple discharge or bleeding, No axillary adenopathy Heart: regular rate and rhythm Abdomen: soft, non-tender; no masses, no organomegaly Extremities: extremities normal, atraumatic, no cyanosis or edema Skin: skin color, texture, turgor normal. No rashes or lesions   Neurologic: grossly normal  Pelvic: External genitalia:  no lesions              No abnormal inguinal nodes palpated.              Urethra:  normal appearing urethra with no masses, tenderness or lesions              Bartholins and Skenes: normal                 Vagina: normal appearing vagina with normal color and discharge, no lesions.  Second degree cystocele.              Cervix:  absent              Pap taken: No. Bimanual Exam:  Uterus:   absent  Adnexa: no mass, fullness, tenderness              Rectal exam: Yes.  .  Confirms.              Anus:  normal sphincter tone, no lesions  Chaperone was present for exam.  Assessment:   Screening breast exam.  Pelvic exam with abnormal finding.  Status post TAH/A and P repair - fibroid, prolapse.  Vaginal atrophy. Second degree cystocele. Osteoporosis.On Reclast.Hx prior Actonel use.  GERD. Constipation.Chronic. Rheumatoid arthritis. Joint pain. Sjogren's.  FH breast cancer - sister. Genetic testing negative.  Plan: Mammogram screening discussed. Self breast awareness reviewed. Pap and HR HPV as above. Guidelines for Calcium, Vitamin D, regular exercise program including cardiovascular and weight bearing exercise. Refill of Vagifem.  I discussed potential effect on breast cancer.  Follow up annually and prn.     23 min  total time was spent for this patient encounter, including preparation, face-to-face counseling with the patient, coordination of care, and documentation of the encounter.

## 2020-04-02 ENCOUNTER — Encounter: Payer: Self-pay | Admitting: Obstetrics and Gynecology

## 2020-04-02 ENCOUNTER — Ambulatory Visit (INDEPENDENT_AMBULATORY_CARE_PROVIDER_SITE_OTHER): Payer: Medicare Other | Admitting: Physician Assistant

## 2020-04-02 ENCOUNTER — Other Ambulatory Visit: Payer: Self-pay

## 2020-04-02 ENCOUNTER — Ambulatory Visit (INDEPENDENT_AMBULATORY_CARE_PROVIDER_SITE_OTHER): Payer: Medicare Other | Admitting: Obstetrics and Gynecology

## 2020-04-02 VITALS — BP 118/64 | HR 61 | Ht 65.0 in | Wt 178.0 lb

## 2020-04-02 DIAGNOSIS — Z01411 Encounter for gynecological examination (general) (routine) with abnormal findings: Secondary | ICD-10-CM | POA: Diagnosis not present

## 2020-04-02 DIAGNOSIS — M170 Bilateral primary osteoarthritis of knee: Secondary | ICD-10-CM

## 2020-04-02 DIAGNOSIS — N952 Postmenopausal atrophic vaginitis: Secondary | ICD-10-CM | POA: Diagnosis not present

## 2020-04-02 DIAGNOSIS — Z1239 Encounter for other screening for malignant neoplasm of breast: Secondary | ICD-10-CM | POA: Diagnosis not present

## 2020-04-02 DIAGNOSIS — N8110 Cystocele, unspecified: Secondary | ICD-10-CM

## 2020-04-02 DIAGNOSIS — M0579 Rheumatoid arthritis with rheumatoid factor of multiple sites without organ or systems involvement: Secondary | ICD-10-CM

## 2020-04-02 MED ORDER — ESTRADIOL 10 MCG VA TABS: 1.0000 | ORAL_TABLET | VAGINAL | 3 refills | Status: DC

## 2020-04-02 MED ORDER — LIDOCAINE HCL 1 % IJ SOLN
1.5000 mL | INTRAMUSCULAR | Status: AC | PRN
  Administered 2020-04-02: 1.5 mL

## 2020-04-02 MED ORDER — METHOTREXATE SODIUM CHEMO INJECTION 50 MG/2ML
15.0000 mg | INTRAMUSCULAR | 0 refills | Status: DC
Start: 2020-04-02 — End: 2020-10-23

## 2020-04-02 MED ORDER — ORENCIA 125 MG/ML ~~LOC~~ SOSY: PREFILLED_SYRINGE | SUBCUTANEOUS | 2 refills | Status: DC

## 2020-04-02 MED ORDER — SODIUM HYALURONATE (VISCOSUP) 20 MG/2ML IX SOSY
20.0000 mg | PREFILLED_SYRINGE | INTRA_ARTICULAR | Status: AC | PRN
Start: 2020-04-02 — End: 2020-04-02
  Administered 2020-04-02: 20 mg via INTRA_ARTICULAR

## 2020-04-02 MED ORDER — SODIUM HYALURONATE (VISCOSUP) 20 MG/2ML IX SOSY
20.0000 mg | PREFILLED_SYRINGE | INTRA_ARTICULAR | Status: AC | PRN
  Administered 2020-04-02: 20 mg via INTRA_ARTICULAR

## 2020-04-02 MED ORDER — LIDOCAINE HCL 1 % IJ SOLN
1.5000 mL | INTRAMUSCULAR | Status: AC | PRN
Start: 2020-04-02 — End: 2020-04-02
  Administered 2020-04-02: 1.5 mL

## 2020-04-02 NOTE — Telephone Encounter (Signed)
Patient requested prescription refill of Methotrexate to be sent to [facility] and Orencia to be sent to [facility].

## 2020-04-02 NOTE — Progress Notes (Signed)
   Procedure Note  Patient: Deanna Norris             Date of Birth: December 19, 1945           MRN: [id number]             Visit Date: 04/02/2020  Procedures: Visit Diagnoses:  1. Primary osteoarthritis of both knees    Euflexxa #1 bilateral knees, B/B  Large Joint Inj: bilateral knee on 04/02/2020 9:10 AM Indications: pain Details: 27 G 1.5 in needle, medial approach  Arthrogram: No  Medications (Right): 1.5 mL lidocaine 1 %; 20 mg Sodium Hyaluronate 20 MG/2ML Aspirate (Right): 0 mL Medications (Left): 1.5 mL lidocaine 1 %; 20 mg Sodium Hyaluronate 20 MG/2ML Aspirate (Left): 0 mL Outcome: tolerated well, no immediate complications Procedure, treatment alternatives, risks and benefits explained, specific risks discussed. Consent was given by the patient. Immediately prior to procedure a time out was called to verify the correct patient, procedure, equipment, support staff and site/side marked as required. Patient was prepped and draped in the usual sterile fashion.     Patient tolerated the procedure well.  Aftercare was discussed.  Hazel Sams, PA-C

## 2020-04-02 NOTE — Telephone Encounter (Signed)
Next Visit: 07/13/2020  Last Visit: 02/23/2020  Last Fill: MTX 02/23/2020, Orencia 01/11/2020  DX: Rheumatoid arthritis involving multiple sites with positive rheumatoid factor   Current Dose per office note 02/23/2020, Orencia 125 mg sq injections every 7 days, methotrexate 0.6 ml sq every 7 days  Labs: 03/16/2020, Glucose is 118. Rest of CMP WNL. Hgb and hct are borderline elevated. Rest of CBC WNL.   TB Gold: 03/16/2020, negative  Okay to refill MTX and Orencia?

## 2020-04-02 NOTE — Patient Instructions (Signed)

## 2020-04-04 ENCOUNTER — Other Ambulatory Visit: Payer: Self-pay

## 2020-04-04 ENCOUNTER — Ambulatory Visit (INDEPENDENT_AMBULATORY_CARE_PROVIDER_SITE_OTHER): Payer: Medicare Other | Admitting: Internal Medicine

## 2020-04-04 ENCOUNTER — Encounter: Payer: Self-pay | Admitting: Internal Medicine

## 2020-04-04 ENCOUNTER — Telehealth: Payer: Self-pay | Admitting: Internal Medicine

## 2020-04-04 VITALS — BP 120/70 | HR 60 | Temp 97.2°F | Ht 65.5 in | Wt 180.6 lb

## 2020-04-04 DIAGNOSIS — J8489 Other specified interstitial pulmonary diseases: Secondary | ICD-10-CM | POA: Diagnosis not present

## 2020-04-04 DIAGNOSIS — M359 Systemic involvement of connective tissue, unspecified: Secondary | ICD-10-CM | POA: Diagnosis not present

## 2020-04-04 DIAGNOSIS — Z8739 Personal history of other diseases of the musculoskeletal system and connective tissue: Secondary | ICD-10-CM | POA: Diagnosis not present

## 2020-04-04 DIAGNOSIS — D849 Immunodeficiency, unspecified: Secondary | ICD-10-CM

## 2020-04-04 DIAGNOSIS — J849 Interstitial pulmonary disease, unspecified: Secondary | ICD-10-CM | POA: Diagnosis not present

## 2020-04-04 DIAGNOSIS — Z5181 Encounter for therapeutic drug level monitoring: Secondary | ICD-10-CM

## 2020-04-04 DIAGNOSIS — Z01812 Encounter for preprocedural laboratory examination: Secondary | ICD-10-CM

## 2020-04-04 NOTE — Telephone Encounter (Signed)
Called and spoke with patient. Let them know their Bronch is scheduled for 05/10/20 with Dr. [name] at [place] [facility].  Patient was instructed to arrive at hospital at 11:30am. They were instructed to bring someone with them as they will not be able to drive home from procedure. Patient instructed not to have anything to eat or drink after midnight. Patient only taken blood pressure medication morning of procedure with sips of water.  Patient knows to come into the clinic to have labs drawn 1-2 weeks before procedure.   Patient's covid screening is scheduled at 2:45pm for 05/07/20 at [facility] location.  Patient voiced understanding, nothing further needed  Routing to Mr as Juluis Rainier

## 2020-04-04 NOTE — Telephone Encounter (Signed)
PATIENT: Deanna Norris: female MRN: [id number] DOB: 11/18/1945 ADDRESS: [place] [address]    Please schedule the following:  Diagnosis: ILD with RA and immunosuppression Procedure: Video bronchocoopy, flexible bronchoscopy with BAL.,  No biopsy planned  Envisia Classifer Transbronchial biopsy: NO Anesthesia: Moderate sedation Do you need Fluro?  No Size of Scope: Regular size Pre-med nebulized lidocaine: Yes Priority: April 27, 2020 Friday, May 5 and sixth, 2022 Date:  Alternate Date: As above Time: AM any time/ PM any time although early afternoon preferred Location: [place] Does patient have OSA?  No DM?  No or Latex allergy?  No Medication Restriction: Hold medications except blood pressure medications on the morning of procedure Anticoagulate/Antiplatelet: Not applicable Pre-op Labs Ordered: CBC, CMP, PT/INR, PTT Imaging request: No need     MISCELLANEOUS KEY INSTRUCTIONS    Please coordinate Pre-op COVID Testing   Please let Dr [name] know via reply phone message on Epic  Thank you     Key patient medical info     Allergy History:  Allergies  Allergen Reactions  . Adalimumab     REACTION: frequent sinus infection  . Diclofenac     unk  . Dicyclomine     Turn teeth gray  . Doxycycline Hyclate Other (See Comments)    "turns teeth gray"  . Enbrel [Etanercept]     SINUS INFECTIONS  . Fosamax [Alendronate Sodium]     Acid reflux  . Methotrexate Derivatives Nausea Only    Oral metotrexate  . Nickel   . Piroxicam     unk  . Salagen [Pilocarpine Hcl] Other (See Comments)    Excessive sweating, headaches  . Sulfamethoxazole-Trimethoprim     REACTION: face and hands rash and felt burning sensation  . Tramadol Hcl     REACTION: nausea  . Plaquenil [Hydroxychloroquine] Rash     Current Outpatient Medications:  .  acetaminophen (TYLENOL) 500 MG tablet, Take 500 mg by mouth every 8 (eight) hours as needed.,  Disp: , Rfl:  .  albuterol (PROAIR HFA) 108 (90 Base) MCG/ACT inhaler, Inhale 2 puffs into the lungs every 4 (four) hours as needed for wheezing or shortness of breath., Disp: 18 g, Rfl: 3 .  Ascorbic Acid (VITAMIN C PO), Take 500 mg by mouth daily., Disp: , Rfl:  .  aspirin 81 MG tablet, Take 81 mg by mouth daily., Disp: , Rfl:  .  budesonide (PULMICORT) 180 MCG/ACT inhaler, Inhale 1 puff into the lungs daily as needed., Disp: , Rfl:  .  Calcium Carbonate-Vitamin D (CALCIUM 500/D PO), Take 1 tablet by mouth 2 (two) times daily., Disp: , Rfl:  .  Cholecalciferol (VITAMIN D3) 2000 UNITS TABS, Take 1 tablet by mouth daily., Disp: , Rfl:  .  cycloSPORINE (RESTASIS) 0.05 % ophthalmic emulsion, Place 1 drop into both eyes 2 (two) times daily., Disp: , Rfl:  .  desonide (DESOWEN) 0.05 % lotion, Apply topically as needed., Disp: , Rfl:  .  diclofenac Sodium (VOLTAREN) 1 % GEL, Apply 2-4 grams to affected joint 4 times daily as needed., Disp: 400 g, Rfl: 2 .  Estradiol (VAGIFEM) 10 MCG TABS vaginal tablet, Place 1 tablet (10 mcg total) vaginally 2 (two) times a week. Place one tablet (10 mcg) per vagina at bedtime twice a week., Disp: 24 tablet, Rfl: 3 .  famotidine (PEPCID) 40 MG tablet, Take 1 tablet (40 mg total) by mouth 2 (two) times daily., Disp: 180 tablet, Rfl: 3 .  fluticasone (  FLONASE) 50 MCG/ACT nasal spray, USE 2 SPRAYS IN EACH NOSTRIL DAILY AS NEEDED FOR RHINITIS OR ALLERGIES, Disp: 48 g, Rfl: 3 .  folic acid (FOLVITE) 1 MG tablet, Take 2 tablets (2 mg total) by mouth daily., Disp: 180 tablet, Rfl: 3 .  Ibuprofen 200 MG CAPS, Take 3 capsules by mouth as needed., Disp: , Rfl:  .  Magnesium Malate 1250 (141.7 Mg) MG TABS, Take 3 tablets by mouth daily. , Disp: , Rfl:  .  methotrexate 50 MG/2ML injection, Inject 0.6 mLs (15 mg total) into the skin once a week., Disp: 8 mL, Rfl: 0 .  Multiple Vitamin (MULTI-VITAMINS) TABS, Take 1 tablet by mouth daily., Disp: , Rfl:  .  Omega-3 1000 MG CAPS, Take  1 tablet by mouth daily., Disp: , Rfl:  .  ORENCIA 125 MG/ML SOSY, INJECT THE CONTENTS OF 1 SYRINGE UNDER THE SKIN EVERY WEEK, Disp: 4 mL, Rfl: 2 .  Tuberculin-Allergy Syringes 27G X 1/2" 1 ML KIT, Inject 1 Syringe into the skin once a week. To be used weekly with methotrexate., Disp: , Rfl:  .  TURMERIC PO, Take by mouth daily., Disp: , Rfl:  .  Zinc 50 MG TABS, , Disp: , Rfl:  .  zoledronic acid (RECLAST) 5 MG/100ML SOLN injection, Inject 5 mg into the vein once. Takes yearly, Disp: , Rfl:  .  zolpidem (AMBIEN) 10 MG tablet, Take 0.5 tablets (5 mg total) by mouth at bedtime as needed for sleep., Disp: 30 tablet, Rfl: 2   has a past medical history of Asthma, Constipation, Fibroid, GERD (gastroesophageal reflux disease), Insomnia, Lumbar vertebral fracture ([place]), Menopausal symptoms, Osteoporosis, Postmenopausal HRT (hormone replacement therapy), RA (rheumatoid arthritis) ([place]) (11/2004), Rosacea, Sjogren's disease ([place]), Sleep apnea (2008), and Vitamin D deficiency.    has a past surgical history that includes Vaginal delivery; Cystocele repair (10/2007); Abdominal hysterectomy (01/1993); Breast surgery (1991); Melanoma excision (Right, 01/2012); and Nasal sinus surgery (07/1991).   SIGNATURE    Dr. Brand Males, M.D., F.C.C.P,  Pulmonary and Critical Care Medicine Staff Physician, [place] Director - Interstitial Lung Disease  Program  [facility] [place], [place], [address]  Pager: [phone], If no answer or between  15:00h - 7:00h: call [phone] Telephone: [phone]  10:23 AM 04/04/2020

## 2020-04-04 NOTE — Patient Instructions (Addendum)
ICD-10-CM   1. 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 ILD (interstitial lung disease) ([place])  J84.9   3. History of rheumatoid arthritis  Z87.39   4. Immunosuppressed status ([place])  D84.9     You have onset of interstitial lung diease (ILD) in 2020 and since then is somewhat progressive. This can explain your worsening symptoms Alternate possibilities include methotrexate toxicity and opportunistic infections although the probability of this is low  Plan -Do full pulmonary function test anytime in the next few to several weeks -Schedule bronchoscopy with lavage at [facility] on April 27, 2020 Friday -Schedule follow-up appointment from bronchoscopy on a 30-minute visit slot in the second half of May 2022 -For now continue immunosuppressive medications  -Referral pulmonary rehabilitation for control of his symptoms  Follow-up   -Second half of May 2022,  30-minute visit with Dr. [name] but after completing the above

## 2020-04-04 NOTE — Progress Notes (Signed)
Subjective:    Patient ID: Deanna Norris, female    DOB: 08-Jul-1945, 75 y.o.   MRN: [id number]  HPI    OV 11/01/2013  Chief Complaint  Patient presents with  . Follow-up    Pt here after cards referral and CPST. Pt c/o dyspnea only with over exertion. Pt denies cough and CP   Follow-up dyspnea evaluation after cardiopulmonary stress testing. She underwent cardiopulmonary stress testing on 10/10/2013  Results show maximal effort but the VO2 max of 108% predicted and a normal anaerobic threshold. Her ventilator parameters were normal. She appropriately reached heart rate max with a normal heart rate reserve at peak exercise. However the O2 pulse which is a surrogate for stroke volume was flat during the second half of exercise. This is classically consistent with mild diastolic dysfunction especially in the context of a normal nuclear medicine cardiac stress test. There was no evidence of exercise induced bronchospasm.  She has coronary artery calcification and she underwent a nuclear medicine cardiac stress test 10/19/2013 that has been reported as normal   OV Oct 2020: Dr Vaughan Browner Chief complaint: Follow up for mild asthma  HPI: 75 year old with history of sarcoidosis, rheumatoid arthritis, sleep apnea Complains of dyspnea on exertion for the past few months.  She has mild symptoms at rest.  Denies any cough, sputum production, fevers, chills Previously evaluated by Dr. [name] in 2015 with high-resolution CT with no evidence of interstitial lung disease. She also had a cardiology evaluation at that time for coronary atherosclerosis with negative stress test  Has history of seropositive rheumatoid arthritis, Sjogren's syndrome.  Maintained on Orencia and methotrexate.  Followed with Dr. Estanislado Pandy. Has sleep apnea for which she is on CPAP for the past 15 years with no issues.  Sees Dr. Maxwell Caul for OSA.  Pets: No pets Occupation: Homemaker Exposures: No known exposures.  No  mold, hot tub, Jacuzzi Smoking history: Never smoker Travel history: Generally from [place].  Has been in [place] since 1990 Relevant family history: No significant family history of lung disease   Given Symbicort at last visit.  She has not noticed any difference with this and hence stopped it a month ago. She is wary of using inhaled corticosteroids due to side effects of osteoporosis.     OV 04/04/2020  Subjective:  Patient ID: Deanna Norris, female , DOB: 04-30-1945 , age 75 y.o. , MRN: [id number] , ADDRESS: [place]  [id number] PCP Tower, [name], MD Patient Care Team: Tower, [name], MD as PCP - Kennon Portela, MD as Consulting Physician (Dermatology) Juanita Craver, MD as Consulting Physician (Gastroenterology) Marica Otter, OD as Consulting Physician (Optometry) Belva Crome, MD as Consulting Physician (Cardiology)  This Provider for this visit: Treatment Team:  Attending Provider: Brand Males, MD    04/04/2020 -   Chief Complaint  Patient presents with  . Follow-up    Pt states that she does have complaints of SOB which is worse with activities.     HPI Deanna Norris 75 y.o. -returns for follow-up.  I personally saw her in 2015 for shortness of breath.  High-resolution CT chest at that time was clean.  Then in 2020 she started noticing insidious onset of shortness of breath.  She followed up with Dr. Vaughan Browner.  CT chest showed possible early onset ILD.  Then again because of the pandemic she did not follow-up.  She is now following up with Dr. [name] to establish care in  the ILD center.  She saying she is having worsening shortness of breath with exertion relieved by rest.  Symptom severity is documented below.  She saying is definitely progressive.  There is no associated chest pain.  She is seeing cardiology and apparently she has been cleared.  She is immunosuppressed.  She is been on methotrexate since 2005 2006 then  approximately in 2012 she got switched to injection form of methotrexate following vertigo with the oral form.  She has been on Orencia for 8 years.  She is not on prednisone or Bactrim.  Most recently in February 2022 she had high-resolution CT chest that shows presence of ILD and that is progressive compared to 2 years earlier.  I personally visualized this film and showed it to the patient.  Therefore she is being referred here.  Last pulmonary function test was in 2015.   SYMPTOM SCALE - ILD 04/04/2020   O2 use ra  Shortness of Breath 0 -> 5 scale with 5 being worst (score 6 If unable to do)  At rest 0  Simple tasks - showers, clothes change, eating, shaving 0  Household (dishes, doing bed, laundry) 1  Shopping 1  Walking level at own pace 1  Walking up Stairs 3  Total (30-36) Dyspnea Score 6  How bad is your cough? 0  How bad is your fatigue 2  How bad is nausea 1  How bad is vomiting?  0  How bad is diarrhea? 0  How bad is anxiety? 0  How bad is depression 0       Simple office walk 185 feet x  3 laps goal with forehead probe 04/04/2020   O2 used ra  Number laps completed 3  Comments about pace mod  Resting Pulse Ox/HR 100% and 60/min  Final Pulse Ox/HR 95% and 83/min  Desaturated </= 88% no  Desaturated <= 3% points yesm 5  Got Tachycardic >/= 90/min bo  Symptoms at end of test Mild dyspena  Miscellaneous comments x      CT Chest data 03/05/20   Narrative & Impression  CLINICAL DATA:  75 year old female with history of shortness of breath on exertion. History of rheumatoid arthritis.  EXAM: CT CHEST WITHOUT CONTRAST  TECHNIQUE: Multidetector CT imaging of the chest was performed following the standard protocol without intravenous contrast. High resolution imaging of the lungs, as well as inspiratory and expiratory imaging, was performed.  COMPARISON:  High-resolution chest CT 08/04/2018.  FINDINGS: Cardiovascular: Heart size is normal. There is  no significant pericardial fluid, thickening or pericardial calcification. There is aortic atherosclerosis, as well as atherosclerosis of the great vessels of the mediastinum and the coronary arteries, including calcified atherosclerotic plaque in the left main, left anterior descending and right coronary arteries.  Mediastinum/Nodes: No pathologically enlarged mediastinal or hilar lymph nodes. Please note that accurate exclusion of hilar adenopathy is limited on noncontrast CT scans. Esophagus is unremarkable in appearance. No axillary lymphadenopathy.  Lungs/Pleura: High-resolution images again demonstrate minimal ground-glass attenuation in the extreme lung bases. Trace amount of septal thickening noted, most evident in the inferior segment of the lingula and lateral aspect of the left lower lobe. No traction bronchiectasis or honeycombing. Inspiratory and expiratory imaging demonstrates moderate air trapping indicative of small airways disease. No acute consolidative airspace disease. No pleural effusions. No suspicious appearing pulmonary nodules or masses are noted.  Upper Abdomen: Aortic atherosclerosis.  Musculoskeletal: There are no aggressive appearing lytic or blastic lesions noted in the visualized portions of  the skeleton.  IMPRESSION: 1. Very subtle changes suggestive of interstitial lung disease redemonstrated, with minimal progression compared to the prior study. Findings are extremely mild and considered indeterminate for usual interstitial pneumonia (UIP) per current ATS guidelines. If there is persistent clinical concern for interstitial lung disease, repeat high-resolution chest CT is recommended in 12 months to assess for temporal changes in the appearance of the lung parenchyma. 2. Moderate air trapping indicative of small airways disease. 3. Aortic atherosclerosis, in addition to left main and 2 vessel coronary artery disease. Assessment for potential  risk factor modification, dietary therapy or pharmacologic therapy may be warranted, if clinically indicated.  Aortic Atherosclerosis (ICD10-I70.0).   Electronically Signed   By: Vinnie Langton M.D.   On: 03/06/2020 08:59      No results found.    PFT  PFT Results Latest Ref Rng & Units 09/01/2013  FVC-Pre L 2.78  FVC-Predicted Pre % 83  FVC-Post L 2.86  FVC-Predicted Post % 86  Pre FEV1/FVC % % 73  Post FEV1/FCV % % 80  FEV1-Pre L 2.03  FEV1-Predicted Pre % 80  FEV1-Post L 2.29  DLCO uncorrected ml/min/mmHg 20.14  DLCO UNC% % 74  DLVA Predicted % 99  TLC L 5.47  TLC % Predicted % 102  RV % Predicted % 101       has a past medical history of Asthma, Constipation, Fibroid, GERD (gastroesophageal reflux disease), Insomnia, Lumbar vertebral fracture ([place]), Menopausal symptoms, Osteoporosis, Postmenopausal HRT (hormone replacement therapy), RA (rheumatoid arthritis) ([place]) (11/2004), Rosacea, Sjogren's disease ([place]), Sleep apnea (2008), and Vitamin D deficiency.   reports that she has never smoked. She has never used smokeless tobacco.  Past Surgical History:  Procedure Laterality Date  . ABDOMINAL HYSTERECTOMY  01/1993   partial with AP repair, secondary to fibroids  . BREAST SURGERY  1991   breast biopsy  . CYSTOCELE REPAIR  10/2007   with graft  . MELANOMA EXCISION Right 01/2012   excision right posterior arm  . NASAL SINUS SURGERY  07/1991  . VAGINAL DELIVERY     x3    Allergies  Allergen Reactions  . Adalimumab     REACTION: frequent sinus infection  . Diclofenac     unk  . Dicyclomine     Turn teeth gray  . Doxycycline Hyclate Other (See Comments)    "turns teeth gray"  . Enbrel [Etanercept]     SINUS INFECTIONS  . Fosamax [Alendronate Sodium]     Acid reflux  . Methotrexate Derivatives Nausea Only    Oral metotrexate  . Nickel   . Piroxicam     unk  . Salagen [Pilocarpine Hcl] Other (See Comments)    Excessive sweating, headaches  .  Sulfamethoxazole-Trimethoprim     REACTION: face and hands rash and felt burning sensation  . Tramadol Hcl     REACTION: nausea  . Plaquenil [Hydroxychloroquine] Rash    Immunization History  Administered Date(s) Administered  . DTaP 04/06/2013  . Influenza Split 10/22/2010  . Influenza Whole 10/10/2009, 10/20/2011  . Influenza, High Dose Seasonal PF 10/14/2016, 10/08/2018, 10/07/2019  . Influenza, Seasonal, Injecte, Preservative Fre 10/06/2012  . Influenza,inj,Quad PF,6+ Mos 10/30/2014  . Influenza,inj,quad, With Preservative 10/12/2017  . Influenza-Unspecified 10/26/2013, 10/22/2015  . PFIZER(Purple Top)SARS-COV-2 Vaccination 06/02/2019, 08/26/2019  . Pneumococcal Conjugate-13 02/13/2014  . Pneumococcal Polysaccharide-23 06/12/2010, 03/28/2015  . Td 02/07/2003, 03/23/2013  . Tdap 03/23/2013  . Zoster 05/08/2014    Family History  Problem Relation Age of Onset  .  Breast cancer Sister 24  . Thyroid disease Sister   . Cancer Sister 35       Leukemia--CLL  . Heart disease Brother      Current Outpatient Medications:  .  acetaminophen (TYLENOL) 500 MG tablet, Take 500 mg by mouth every 8 (eight) hours as needed., Disp: , Rfl:  .  albuterol (PROAIR HFA) 108 (90 Base) MCG/ACT inhaler, Inhale 2 puffs into the lungs every 4 (four) hours as needed for wheezing or shortness of breath., Disp: 18 g, Rfl: 3 .  Ascorbic Acid (VITAMIN C PO), Take 500 mg by mouth daily., Disp: , Rfl:  .  aspirin 81 MG tablet, Take 81 mg by mouth daily., Disp: , Rfl:  .  budesonide (PULMICORT) 180 MCG/ACT inhaler, Inhale 1 puff into the lungs daily as needed., Disp: , Rfl:  .  Calcium Carbonate-Vitamin D (CALCIUM 500/D PO), Take 1 tablet by mouth 2 (two) times daily., Disp: , Rfl:  .  Cholecalciferol (VITAMIN D3) 2000 UNITS TABS, Take 1 tablet by mouth daily., Disp: , Rfl:  .  cycloSPORINE (RESTASIS) 0.05 % ophthalmic emulsion, Place 1 drop into both eyes 2 (two) times daily., Disp: , Rfl:  .  desonide  (DESOWEN) 0.05 % lotion, Apply topically as needed., Disp: , Rfl:  .  diclofenac Sodium (VOLTAREN) 1 % GEL, Apply 2-4 grams to affected joint 4 times daily as needed., Disp: 400 g, Rfl: 2 .  Estradiol (VAGIFEM) 10 MCG TABS vaginal tablet, Place 1 tablet (10 mcg total) vaginally 2 (two) times a week. Place one tablet (10 mcg) per vagina at bedtime twice a week., Disp: 24 tablet, Rfl: 3 .  famotidine (PEPCID) 40 MG tablet, Take 1 tablet (40 mg total) by mouth 2 (two) times daily., Disp: 180 tablet, Rfl: 3 .  fluticasone (FLONASE) 50 MCG/ACT nasal spray, USE 2 SPRAYS IN EACH NOSTRIL DAILY AS NEEDED FOR RHINITIS OR ALLERGIES, Disp: 48 g, Rfl: 3 .  folic acid (FOLVITE) 1 MG tablet, Take 2 tablets (2 mg total) by mouth daily., Disp: 180 tablet, Rfl: 3 .  Ibuprofen 200 MG CAPS, Take 3 capsules by mouth as needed., Disp: , Rfl:  .  Magnesium Malate 1250 (141.7 Mg) MG TABS, Take 3 tablets by mouth daily. , Disp: , Rfl:  .  methotrexate 50 MG/2ML injection, Inject 0.6 mLs (15 mg total) into the skin once a week., Disp: 8 mL, Rfl: 0 .  Multiple Vitamin (MULTI-VITAMINS) TABS, Take 1 tablet by mouth daily., Disp: , Rfl:  .  Omega-3 1000 MG CAPS, Take 1 tablet by mouth daily., Disp: , Rfl:  .  ORENCIA 125 MG/ML SOSY, INJECT THE CONTENTS OF 1 SYRINGE UNDER THE SKIN EVERY WEEK, Disp: 4 mL, Rfl: 2 .  Tuberculin-Allergy Syringes 27G X 1/2" 1 ML KIT, Inject 1 Syringe into the skin once a week. To be used weekly with methotrexate., Disp: , Rfl:  .  TURMERIC PO, Take by mouth daily., Disp: , Rfl:  .  Zinc 50 MG TABS, , Disp: , Rfl:  .  zoledronic acid (RECLAST) 5 MG/100ML SOLN injection, Inject 5 mg into the vein once. Takes yearly, Disp: , Rfl:  .  zolpidem (AMBIEN) 10 MG tablet, Take 0.5 tablets (5 mg total) by mouth at bedtime as needed for sleep., Disp: 30 tablet, Rfl: 2      Objective:   Vitals:   04/04/20 0938  BP: 120/70  Pulse: 60  Temp: (!) 97.2 F (36.2 C)  TempSrc: Temporal  SpO2:  100%  Weight:  180 lb 9.6 oz (81.9 kg)  Height: 5' 5.5" (1.664 m)    Estimated body mass index is 29.6 kg/m as calculated from the following:   Height as of this encounter: 5' 5.5" (1.664 m).   Weight as of this encounter: 180 lb 9.6 oz (81.9 kg).  @WEIGHTCHANGE @  [employer]   04/04/20 0938  Weight: 180 lb 9.6 oz (81.9 kg)     Physical Exam  General Appearance:    Alert, cooperative, no distress, appears stated age - yes , Deconditioned looking - no , OBESE  - no, Sitting on Wheelchair -  no  Head:    Normocephalic, without obvious abnormality, atraumatic  Eyes:    PERRL, conjunctiva/corneas clear,  Ears:    Normal TM's and external ear canals, both ears  Nose:   Nares normal, septum midline, mucosa normal, no drainage    or sinus tenderness. OXYGEN ON  - no . Patient is @ ra   Throat:   Lips, mucosa, and tongue normal; teeth and gums normal. Cyanosis on lips - no  Neck:   Supple, symmetrical, trachea midline, no adenopathy;    thyroid:  no enlargement/tenderness/nodules; no carotid   bruit or JVD  Back:     Symmetric, no curvature, ROM normal, no CVA tenderness  Lungs:     Distress - no , Wheeze no, Barrell Chest - no, Purse lip breathing - no, Crackles - no   Chest Wall:    No tenderness or deformity.    Heart:    Regular rate and rhythm, S1 and S2 normal, no rub   or gallop, Murmur - no  Breast Exam:    NOT DONE  Abdomen:     Soft, non-tender, bowel sounds active all four quadrants,    no masses, no organomegaly. Visceral obesity - no  Genitalia:   NOT DONE  Rectal:   NOT DONE  Extremities:   Extremities - normal, Has Cane - no, Clubbing - no, Edema - no  Pulses:   2+ and symmetric all extremities  Skin:   Stigmata of Connective Tissue Disease - no  Lymph nodes:   Cervical, supraclavicular, and axillary nodes normal  Psychiatric:  Neurologic:   Pleasant - yes, Anxious - no, Flat affect - no  CAm-ICU - neg, Alert and Oriented x 3 - yes, Moves all 4s - yes, Speech - normal, Cognition  - intact          Assessment:       ICD-10-CM   1. 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 ILD (interstitial lung disease) ([place])  J84.9 Pulmonary function test    AMB referral to pulmonary rehabilitation  3. History of rheumatoid arthritis  Z87.39   4. Immunosuppressed status ([place])  D84.9        Plan:     Patient Instructions     ICD-10-CM   1. Interstitial lung disease due to connective tissue disease ([place])  J84.89    M35.9   2. ILD (interstitial lung disease) ([place])  J84.9   3. History of rheumatoid arthritis  Z87.39   4. Immunosuppressed status ([place])  D84.9     You have onset of interstitial lung diease (ILD) in 2020 and since then is somewhat progressive. This can explain your worsening symptoms Alternate possibilities include methotrexate toxicity and opportunistic infections although the probability of this is low  Plan -Do full pulmonary  function test anytime in the next few to several weeks -Schedule bronchoscopy with lavage at [facility] on April 27, 2020 Friday -Schedule follow-up appointment from bronchoscopy on a 30-minute visit slot in the second half of May 2022 -For now continue immunosuppressive medications  -Referral pulmonary rehabilitation for control of his symptoms  Follow-up   -Second half of May 2022,  30-minute visit with Dr. [name] but after completing the above    ( Level 05 visit: Estb 40-54 min *  in  visit type: on-site physical face to visit  in total care time and counseling or/and coordination of care by this undersigned MD - Dr Brand Males. This includes one or more of the following on this same day 04/04/2020: pre-charting, chart review, note writing, documentation discussion of test results, diagnostic or treatment recommendations, prognosis, risks and benefits of management options, instructions, education, compliance or risk-factor reduction. It  excludes time spent by the [place] or office staff in the care of the patient. Actual time 40 min)   SIGNATURE    Dr. Brand Males, M.D., F.C.C.P,  Pulmonary and Critical Care Medicine Staff Physician, [place] Director - Interstitial Lung Disease  Program  Pulmonary [place] at [place], [place], [address]  Pager: [phone], If no answer or between  15:00h - 7:00h: call [phone] Telephone: [phone]  10:23 AM 04/04/2020

## 2020-04-06 ENCOUNTER — Encounter (HOSPITAL_COMMUNITY): Payer: Self-pay | Admitting: *Deleted

## 2020-04-06 NOTE — Progress Notes (Signed)
Received referral from [facility] for this pt to participate in pulmonary rehab with the the diagnosis of ILD. Clinical review of pt follow up appt on 3/30 Pulmonary office note.  Also reviewed office notes from the Rheumatologist. Pt with Covid Risk Score - 5. Pt appropriate for scheduling for Pulmonary rehab.  Will forward to support staff for scheduling and verification of insurance eligibility/benefits with pt consent. Cherre Huger, BSN Cardiac and Training and development officer

## 2020-04-09 ENCOUNTER — Encounter: Payer: Self-pay | Admitting: Physician Assistant

## 2020-04-09 ENCOUNTER — Ambulatory Visit (INDEPENDENT_AMBULATORY_CARE_PROVIDER_SITE_OTHER): Payer: Medicare Other | Admitting: Physician Assistant

## 2020-04-09 ENCOUNTER — Other Ambulatory Visit: Payer: Self-pay

## 2020-04-09 DIAGNOSIS — M170 Bilateral primary osteoarthritis of knee: Secondary | ICD-10-CM | POA: Diagnosis not present

## 2020-04-09 MED ORDER — SODIUM HYALURONATE (VISCOSUP) 20 MG/2ML IX SOSY
20.0000 mg | PREFILLED_SYRINGE | INTRA_ARTICULAR | Status: AC | PRN
  Administered 2020-04-09: 20 mg via INTRA_ARTICULAR

## 2020-04-09 MED ORDER — LIDOCAINE HCL 1 % IJ SOLN
1.5000 mL | INTRAMUSCULAR | Status: AC | PRN
  Administered 2020-04-09: 1.5 mL

## 2020-04-09 NOTE — Progress Notes (Signed)
   Procedure Note  Patient: [name]             Date of Birth: 07-Mar-1945           MRN: [id number]             Visit Date: 04/09/2020  Procedures: Visit Diagnoses:  1. Primary osteoarthritis of both knees     Euflexxa #2 bilateral knees, B/B Large Joint Inj: bilateral knee on 04/09/2020 9:19 AM Indications: pain Details: 27 G 1.5 in needle, medial approach  Arthrogram: No  Medications (Right): 1.5 mL lidocaine 1 %; 20 mg Sodium Hyaluronate 20 MG/2ML Aspirate (Right): 0 mL Medications (Left): 1.5 mL lidocaine 1 %; 20 mg Sodium Hyaluronate 20 MG/2ML Aspirate (Left): 0 mL Outcome: tolerated well, no immediate complications Procedure, treatment alternatives, risks and benefits explained, specific risks discussed. Consent was given by the patient. Immediately prior to procedure a time out was called to verify the correct patient, procedure, equipment, support staff and site/side marked as required. Patient was prepped and draped in the usual sterile fashion.      Patient tolerated the procedure well.  Aftercare was discussed.  Hazel Sams, PA-C

## 2020-04-16 ENCOUNTER — Ambulatory Visit (INDEPENDENT_AMBULATORY_CARE_PROVIDER_SITE_OTHER): Payer: Medicare Other | Admitting: Physician Assistant

## 2020-04-16 ENCOUNTER — Other Ambulatory Visit: Payer: Self-pay

## 2020-04-16 DIAGNOSIS — M170 Bilateral primary osteoarthritis of knee: Secondary | ICD-10-CM | POA: Diagnosis not present

## 2020-04-16 MED ORDER — SODIUM HYALURONATE (VISCOSUP) 20 MG/2ML IX SOSY
20.0000 mg | PREFILLED_SYRINGE | INTRA_ARTICULAR | Status: AC | PRN
  Administered 2020-04-16: 20 mg via INTRA_ARTICULAR

## 2020-04-16 MED ORDER — LIDOCAINE HCL 1 % IJ SOLN
1.5000 mL | INTRAMUSCULAR | Status: AC | PRN
  Administered 2020-04-16: 1.5 mL

## 2020-04-16 NOTE — Progress Notes (Signed)
   Procedure Note  Patient: Deanna Norris             Date of Birth: 1945-03-12           MRN: [id number]             Visit Date: 04/16/2020  Procedures: Visit Diagnoses:  1. Primary osteoarthritis of both knees    Euflexxa #3 bilateral knees, B/B Large Joint Inj: bilateral knee on 04/16/2020 9:46 AM Indications: pain Details: 27 G 1.5 in needle, medial approach  Arthrogram: No  Medications (Right): 1.5 mL lidocaine 1 %; 20 mg Sodium Hyaluronate 20 MG/2ML Aspirate (Right): 0 mL Medications (Left): 1.5 mL lidocaine 1 %; 20 mg Sodium Hyaluronate 20 MG/2ML Aspirate (Left): 0 mL Outcome: tolerated well, no immediate complications Procedure, treatment alternatives, risks and benefits explained, specific risks discussed. Consent was given by the patient. Immediately prior to procedure a time out was called to verify the correct patient, procedure, equipment, support staff and site/side marked as required. Patient was prepped and draped in the usual sterile fashion.      Patient tolerated the procedure well.  Aftercare was discussed.  Hazel Sams, PA-C

## 2020-04-25 ENCOUNTER — Other Ambulatory Visit: Payer: Self-pay | Admitting: Physician Assistant

## 2020-04-25 NOTE — Telephone Encounter (Signed)
Next Visit: 07/13/2020  Last Visit: 02/23/2020  Last Fill: 04/22/2019  Dx: Rheumatoid arthritis involving multiple sites with positive rheumatoid factor   Current Dose per office note on 2/76/1848, folic acid 1 mg 2 tablets daily  Okay to refill folic acid?

## 2020-05-01 NOTE — Telephone Encounter (Signed)
Dr. Lavell Anchors,  Please see patient's message and advise.  Thank you.

## 2020-05-03 NOTE — Telephone Encounter (Signed)
Okay to at the mouth Sorry for the inconvenience As long as she is n.p.o. after 5 AM [8 hours] we would be okay

## 2020-05-04 NOTE — Progress Notes (Signed)
Attempted to contact pt several times in regards to scheduled procedure next and review information. Pt attending seminar and unable to review today.

## 2020-05-07 ENCOUNTER — Other Ambulatory Visit (HOSPITAL_COMMUNITY)
Admission: RE | Admit: 2020-05-07 | Discharge: 2020-05-07 | Disposition: A | Discharge status: Home / Self Care | Payer: Medicare Other | Source: Ambulatory Visit | Attending: Internal Medicine | Admitting: Internal Medicine

## 2020-05-07 DIAGNOSIS — Z01812 Encounter for preprocedural laboratory examination: Secondary | ICD-10-CM | POA: Diagnosis not present

## 2020-05-07 DIAGNOSIS — Z20822 Contact with and (suspected) exposure to covid-19: Secondary | ICD-10-CM | POA: Diagnosis not present

## 2020-05-08 ENCOUNTER — Other Ambulatory Visit: Payer: Self-pay

## 2020-05-08 LAB — SARS CORONAVIRUS 2 (TAT 6-24 HRS): SARS Coronavirus 2: NEGATIVE

## 2020-05-08 MED ORDER — SPOT INK MARKER SYRINGE KIT
PACK | SUBMUCOSAL | Status: AC
  Filled 2020-05-08: qty 5

## 2020-05-10 ENCOUNTER — Telehealth (HOSPITAL_COMMUNITY): Payer: Self-pay

## 2020-05-10 ENCOUNTER — Ambulatory Visit (HOSPITAL_COMMUNITY): Admission: RE | Admit: 2020-05-10 | Payer: Medicare Other | Source: Home / Self Care | Admitting: Internal Medicine

## 2020-05-10 SURGERY — VIDEO BRONCHOSCOPY WITHOUT FLUORO
Anesthesia: Moderate Sedation

## 2020-05-10 NOTE — Telephone Encounter (Signed)
Called patient to see if she is interested in the Pulmonary Rehab Program. Patient expressed interest. Explained scheduling process and went over insurance, patient verbalized understanding. Also adv pt where we are with scheduling for PR and that we have a back log. (1-4 months) 

## 2020-05-10 NOTE — Telephone Encounter (Signed)
Pt insurance is active and benefits verified through Medicare A/B. Co-pay $0.00, DED $233.00/$233.00 met, out of pocket $0.00/$0.00 met, co-insurance 20%. No pre-authorization required. 05/10/20 @ 11:53AM  Will contact patient to see if she is interested in the Pulmonary Rehab Program.

## 2020-05-11 ENCOUNTER — Telehealth: Payer: Self-pay | Admitting: Internal Medicine

## 2020-05-11 NOTE — Telephone Encounter (Signed)
Lauren  Please reschedule bronchoscopy.  I will arrange for bronchoscopy with lavage on 05/10/2020 but I was called out sick. Therefore rescheduled the same bronchoscopy per prior order.  It can be on May 30, June 2 June third.     first thing in the morning or late afternoon after 2 PM  THanks  MR

## 2020-05-14 NOTE — Telephone Encounter (Signed)
Patient had a PFT and OV on 06/13/20 so wanted to make sure she was only getting 1 covid test and didn't have to reschedule her OV with MR. Here is the schedule that was created for patient.   Tuesday Jun 05 2020-  Covid test at 10am  Thursday June 07 2020- PFT at 4pm Friday June 08 2020 - Bronch with MR at [facility] Wednesday June 13, 2020-  Follow up in office with MR at 10am  Patient voiced back understanding. Patient knows to be at [facility] at [facility] for Bronch procedure. Not eating or drinking prior to procedure and no driving.   Will reroute to MR as FYI  Nothing further needed at this time.

## 2020-06-05 ENCOUNTER — Telehealth: Payer: Self-pay | Admitting: Internal Medicine

## 2020-06-05 ENCOUNTER — Other Ambulatory Visit (HOSPITAL_COMMUNITY)
Admission: RE | Admit: 2020-06-05 | Discharge: 2020-06-05 | Disposition: A | Discharge status: Home / Self Care | Payer: Medicare Other | Source: Ambulatory Visit | Attending: Internal Medicine | Admitting: Internal Medicine

## 2020-06-05 DIAGNOSIS — Z01812 Encounter for preprocedural laboratory examination: Secondary | ICD-10-CM | POA: Insufficient documentation

## 2020-06-05 DIAGNOSIS — Z20822 Contact with and (suspected) exposure to covid-19: Secondary | ICD-10-CM | POA: Diagnosis not present

## 2020-06-05 LAB — SARS CORONAVIRUS 2 (TAT 6-24 HRS): SARS Coronavirus 2: NEGATIVE

## 2020-06-05 NOTE — Telephone Encounter (Signed)
Called patient [name]  to see if we can do her bronch 1 day earlier on thu 06/07/20. There is a slot available in morning. She is agreeable but she has PFT in evening 06/07/20.Also, has followup 06/13/20 whhich is not enough time for results to come in . [name] [facility] not available June 18-25, 2022  Plan   - do bronch 06/07/20 thu 9.30am Timothy Lasso [place] RN at [facility] lab aware and will call patient)  - cancel PFT 06/07/20 but do it later in June 2022 - cancel MR followup in office 06/13/20 but do it in July 2022 to discuss reut  THanks  MR

## 2020-06-06 ENCOUNTER — Telehealth (HOSPITAL_COMMUNITY): Payer: Self-pay

## 2020-06-06 NOTE — Telephone Encounter (Signed)
ATC patient to discuss when she would like to reschedule the following procedures. I cancelled the PFT on 06/07/20 and follow up on June 8th as MR said these were too soon.   Will wait for patient to call back if she does please schedule her for a PFT and covid test at end of June and a 30 min visit with MR beginning of July.

## 2020-06-06 NOTE — Telephone Encounter (Signed)
Called patient to see if she was interested in participating in the Pulmonary Rehab Program. Patient stated yes. Patient will come in for orientation on 07/06/20 @ 1:30PM and will attend the 1:15PM exercise class.  Tourist information centre manager.

## 2020-06-06 NOTE — Telephone Encounter (Signed)
Called spoke with patient.  She is rescheduled with MR and PFT for July 6. covid test will be July 5 and the testing site is closed July 4. Nothing further needed at this time.

## 2020-06-07 ENCOUNTER — Other Ambulatory Visit: Payer: Self-pay

## 2020-06-07 ENCOUNTER — Ambulatory Visit (HOSPITAL_COMMUNITY)
Admission: RE | Admit: 2020-06-07 | Discharge: 2020-06-07 | Disposition: A | Discharge status: Home / Self Care | Payer: Medicare Other | Attending: Internal Medicine | Admitting: Internal Medicine

## 2020-06-07 ENCOUNTER — Encounter (HOSPITAL_COMMUNITY)
Admission: RE | Disposition: A | Discharge status: Home / Self Care | Payer: Self-pay | Source: Home / Self Care | Attending: Internal Medicine

## 2020-06-07 ENCOUNTER — Encounter (HOSPITAL_COMMUNITY): Payer: Self-pay | Admitting: Internal Medicine

## 2020-06-07 DIAGNOSIS — J849 Interstitial pulmonary disease, unspecified: Secondary | ICD-10-CM | POA: Diagnosis not present

## 2020-06-07 DIAGNOSIS — D849 Immunodeficiency, unspecified: Secondary | ICD-10-CM

## 2020-06-07 DIAGNOSIS — M059 Rheumatoid arthritis with rheumatoid factor, unspecified: Secondary | ICD-10-CM | POA: Diagnosis not present

## 2020-06-07 DIAGNOSIS — Z01811 Encounter for preprocedural respiratory examination: Secondary | ICD-10-CM

## 2020-06-07 DIAGNOSIS — R0600 Dyspnea, unspecified: Secondary | ICD-10-CM | POA: Diagnosis not present

## 2020-06-07 DIAGNOSIS — M3500 Sicca syndrome, unspecified: Secondary | ICD-10-CM | POA: Insufficient documentation

## 2020-06-07 DIAGNOSIS — R059 Cough, unspecified: Secondary | ICD-10-CM | POA: Diagnosis not present

## 2020-06-07 HISTORY — PX: VIDEO BRONCHOSCOPY: SHX5072

## 2020-06-07 HISTORY — PX: BRONCHIAL WASHINGS: SHX5105

## 2020-06-07 LAB — BODY FLUID CELL COUNT WITH DIFFERENTIAL
Eos, Fluid: 2 %
Lymphs, Fluid: 16 %
Monocyte-Macrophage-Serous Fluid: 8 % — ABNORMAL LOW (ref 50–90)
Neutrophil Count, Fluid: 74 % — ABNORMAL HIGH (ref 0–25)
Total Nucleated Cell Count, Fluid: 47 cu mm (ref 0–1000)

## 2020-06-07 LAB — PNEUMOCYSTIS JIROVECI SMEAR BY DFA: Pneumocystis jiroveci Ag: NEGATIVE

## 2020-06-07 SURGERY — VIDEO BRONCHOSCOPY WITHOUT FLUORO
Anesthesia: Moderate Sedation

## 2020-06-07 MED ORDER — BUTAMBEN-TETRACAINE-BENZOCAINE 2-2-14 % EX AERO: 1.0000 | INHALATION_SPRAY | Freq: Once | CUTANEOUS | Status: DC

## 2020-06-07 MED ORDER — LIDOCAINE HCL URETHRAL/MUCOSAL 2 % EX GEL
CUTANEOUS | Status: AC
  Filled 2020-06-07: qty 30

## 2020-06-07 MED ORDER — PHENYLEPHRINE HCL 0.25 % NA SOLN
NASAL | Status: DC | PRN
  Administered 2020-06-07: 1 via NASAL

## 2020-06-07 MED ORDER — LIDOCAINE HCL 4 % EX SOLN: Freq: Once | CUTANEOUS | Status: DC

## 2020-06-07 MED ORDER — MIDAZOLAM HCL (PF) 5 MG/ML IJ SOLN
INTRAMUSCULAR | Status: DC | PRN
  Administered 2020-06-07: 2 mg via INTRAVENOUS

## 2020-06-07 MED ORDER — LIDOCAINE HCL 1 % IJ SOLN
INTRAMUSCULAR | Status: AC
  Filled 2020-06-07: qty 20

## 2020-06-07 MED ORDER — LIDOCAINE HCL (PF) 1 % IJ SOLN
INTRAMUSCULAR | Status: DC | PRN
  Administered 2020-06-07: 6 mL

## 2020-06-07 MED ORDER — LIDOCAINE HCL (PF) 4 % IJ SOLN
5.0000 mL | Freq: Once | INTRAMUSCULAR | Status: AC
  Administered 2020-06-07: 5 mL via RESPIRATORY_TRACT

## 2020-06-07 MED ORDER — MIDAZOLAM HCL (PF) 5 MG/ML IJ SOLN
INTRAMUSCULAR | Status: AC
  Filled 2020-06-07: qty 2

## 2020-06-07 MED ORDER — PHENYLEPHRINE HCL 0.25 % NA SOLN: 1.0000 | Freq: Four times a day (QID) | NASAL | Status: DC | PRN

## 2020-06-07 MED ORDER — LIDOCAINE HCL (PF) 4 % IJ SOLN
INTRAMUSCULAR | Status: AC
  Filled 2020-06-07: qty 5

## 2020-06-07 MED ORDER — LIDOCAINE HCL URETHRAL/MUCOSAL 2 % EX GEL: 1.0000 "application " | Freq: Once | CUTANEOUS | Status: DC

## 2020-06-07 MED ORDER — FENTANYL CITRATE (PF) 100 MCG/2ML IJ SOLN
INTRAMUSCULAR | Status: DC | PRN
  Administered 2020-06-07: 50 ug via INTRAVENOUS

## 2020-06-07 MED ORDER — BUTAMBEN-TETRACAINE-BENZOCAINE 2-2-14 % EX AERO
INHALATION_SPRAY | CUTANEOUS | Status: DC | PRN
  Administered 2020-06-07: 1 via TOPICAL

## 2020-06-07 MED ORDER — LIDOCAINE HCL URETHRAL/MUCOSAL 2 % EX GEL
CUTANEOUS | Status: DC | PRN
  Administered 2020-06-07: 1

## 2020-06-07 MED ORDER — FENTANYL CITRATE (PF) 100 MCG/2ML IJ SOLN
INTRAMUSCULAR | Status: AC
  Filled 2020-06-07: qty 4

## 2020-06-07 MED ORDER — PHENYLEPHRINE HCL 0.25 % NA SOLN
NASAL | Status: AC
  Filled 2020-06-07: qty 15

## 2020-06-07 MED ORDER — LACTATED RINGERS IV SOLN: INTRAVENOUS | Status: DC

## 2020-06-07 MED ORDER — MIDAZOLAM HCL (PF) 10 MG/2ML IJ SOLN
INTRAMUSCULAR | Status: DC | PRN
  Administered 2020-06-07: 2 mg via INTRAVENOUS

## 2020-06-07 NOTE — Discharge Instructions (Signed)
BRONCHOSCOPY DISCHARGE INSTRUCTIONS  - Please have someone to drive you home - Please be careful with activities for next 24 hours - You can eat 2-4 hours after getting home provided you are fully alert, able to cough, and are not nauseated or vomiting and     feel well - You are expected to have low grade fever or cough some amount of blood for next 24-48 hours; if this worsens call us - If you are very short of breath or coughing blood or chest pain or not feeling well, call us [phone] anytime or go to emergency room - For followup appointment - see below. If not there please call [phone] to make an appointment with Dr [name] or nurse practitioner   Future Appointments  Date Time Provider [place]  06/18/2020  8:30 AM [facility]  07/02/2020  9:30 AM Tower, [name], MD [facility] PEC  07/06/2020  1:30 PM [facility]-PULMONARY REHAB UNDERGRAD [facility] None  07/10/2020  8:15 AM [facility] NURSE HEALTH ADVISOR [facility] PEC  07/10/2020  9:30 AM MC-SCREENING [facility] None  07/10/2020  1:15 PM [facility]-PULMONARY REHAB UNDERGRAD [facility] None  07/11/2020  2:00 PM [facility] RM [facility] None  07/11/2020  3:00 PM Brand Males, MD [facility] None  07/12/2020  1:15 PM [facility]-PULMONARY REHAB UNDERGRAD [facility]RE[facility] None  07/13/2020 10:30 AM Bo Merino, MD [facility] None  07/17/2020  1:15 PM [facility]-PULMONARY REHAB UNDERGRAD [facility] None  07/19/2020  1:15 PM [facility]-PULMONARY REHAB UNDERGRAD [facility] None  07/24/2020  1:15 PM [facility]-PULMONARY REHAB UNDERGRAD [facility] None  07/26/2020  1:15 PM [facility]-PULMONARY REHAB UNDERGRAD [facility] None  07/31/2020  1:15 PM [facility]-PULMONARY REHAB UNDERGRAD [facility] None  08/02/2020  1:15 PM [facility]-PULMONARY REHAB UNDERGRAD [facility] None  08/07/2020  1:15 PM [facility]-PULMONARY REHAB UNDERGRAD [facility] None  08/09/2020  1:15 PM [facility]-PULMONARY REHAB UNDERGRAD [facility] None  08/14/2020  1:15 PM [facility]-PULMONARY REHAB UNDERGRAD [facility] None  08/16/2020  1:15 PM [facility]-PULMONARY REHAB UNDERGRAD [facility] None   08/21/2020  1:15 PM [facility]-PULMONARY REHAB UNDERGRAD [facility] None  08/23/2020  1:15 PM [facility]-PULMONARY REHAB UNDERGRAD [facility] None  08/28/2020  1:15 PM [facility]-PULMONARY REHAB UNDERGRAD [facility] None  08/30/2020  1:15 PM [facility]-PULMONARY REHAB UNDERGRAD [facility] None  09/04/2020  1:15 PM [facility]-PULMONARY REHAB UNDERGRAD [facility] None  09/06/2020  1:15 PM [facility]-PULMONARY REHAB UNDERGRAD [facility] None  04/03/2021 11:00 AM Amundson Raliegh Ip, MD [facility] None

## 2020-06-07 NOTE — H&P (Signed)
OV 11/01/2013      Chief Complaint  Patient presents with  . Follow-up    Pt here after cards referral and CPST. Pt c/o dyspnea only with over exertion. Pt denies cough and CP   Follow-up dyspnea evaluation after cardiopulmonary stress testing. She underwent cardiopulmonary stress testing on 10/10/2013  Results show maximal effort but the VO2 max of 108% predicted and a normal anaerobic threshold. Her ventilator parameters were normal. She appropriately reached heart rate max with a normal heart rate reserve at peak exercise. However the O2 pulse which is a surrogate for stroke volume was flat during the second half of exercise. This is classically consistent with mild diastolic dysfunction especially in the context of a normal nuclear medicine cardiac stress test. There was no evidence of exercise induced bronchospasm.  She has coronary artery calcification and she underwent a nuclear medicine cardiac stress test 10/19/2013 that has been reported as normal   OV Oct 2020: Dr Vaughan Browner Chief complaint: Follow up for mild asthma  HPI: 75 year old with history of sarcoidosis, rheumatoid arthritis, sleep apnea Complains of dyspnea on exertion for the past few months.  She has mild symptoms at rest.  Denies any cough, sputum production, fevers, chills Previously evaluated by Dr. [name] in 2015 with high-resolution CT with no evidence of interstitial lung disease. She also had a cardiology evaluation at that time for coronary atherosclerosis with negative stress test  Has history of seropositive rheumatoid arthritis, Sjogren's syndrome.  Maintained on Orencia and methotrexate.  Followed with Dr. Estanislado Pandy. Has sleep apnea for which she is on CPAP for the past 15 years with no issues.  Sees Dr. Maxwell Caul for OSA.  Pets: No pets Occupation: Homemaker Exposures: No known exposures.  No mold, hot tub, Jacuzzi Smoking history: Never smoker Travel history: Generally from [place].   Has been in [place] since 1990 Relevant family history: No significant family history of lung disease   Given Symbicort at last visit.  She has not noticed any difference with this and hence stopped it a month ago. She is wary of using inhaled corticosteroids due to side effects of osteoporosis.     OV 04/04/2020  Subjective:  Patient ID: [name], female , DOB: 15-Nov-1945 , age 83 y.o. , MRN: [id number] , ADDRESS: [place] [id number] PCP Tower, [name], MD Patient Care Team: Tower, [name], MD as PCP - Kennon Portela, MD as Consulting Physician (Dermatology) Juanita Craver, MD as Consulting Physician (Gastroenterology) Marica Otter, OD as Consulting Physician (Optometry) Belva Crome, MD as Consulting Physician (Cardiology)  This Provider for this visit: Treatment Team:  Attending Provider: Brand Males, MD    04/04/2020 -       Chief Complaint  Patient presents with  . Follow-up    Pt states that she does have complaints of SOB which is worse with activities.     HPI [name] B Nanna 75 y.o. -returns for follow-up.  I personally saw her in 2015 for shortness of breath.  High-resolution CT chest at that time was clean.  Then in 2020 she started noticing insidious onset of shortness of breath.  She followed up with Dr. Vaughan Browner.  CT chest showed possible early onset ILD.  Then again because of the pandemic she did not follow-up.  She is now following up with Dr. [name] to establish care in the ILD center.  She saying she is having worsening shortness of breath with exertion relieved by  rest.  Symptom severity is documented below.  She saying is definitely progressive.  There is no associated chest pain.  She is seeing cardiology and apparently she has been cleared.  She is immunosuppressed.  She is been on methotrexate since 2005 2006 then approximately in 2012 she got switched to injection form of methotrexate following  vertigo with the oral form.  She has been on Orencia for 8 years.  She is not on prednisone or Bactrim.  Most recently in February 2022 she had high-resolution CT chest that shows presence of ILD and that is progressive compared to 2 years earlier.  I personally visualized this film and showed it to the patient.  Therefore she is being referred here.  Last pulmonary function test was in 2015.   SYMPTOM SCALE - ILD 04/04/2020   O2 use ra  Shortness of Breath 0 -> 5 scale with 5 being worst (score 6 If unable to do)  At rest 0  Simple tasks - showers, clothes change, eating, shaving 0  Household (dishes, doing bed, laundry) 1  Shopping 1  Walking level at own pace 1  Walking up Stairs 3  Total (30-36) Dyspnea Score 6  How bad is your cough? 0  How bad is your fatigue 2  How bad is nausea 1  How bad is vomiting?  0  How bad is diarrhea? 0  How bad is anxiety? 0  How bad is depression 0       Simple office walk 185 feet x  3 laps goal with forehead probe 04/04/2020   O2 used ra  Number laps completed 3  Comments about pace mod  Resting Pulse Ox/HR 100% and 60/min  Final Pulse Ox/HR 95% and 83/min  Desaturated </= 88% no  Desaturated <= 3% points yesm 5  Got Tachycardic >/= 90/min bo  Symptoms at end of test Mild dyspena  Miscellaneous comments x      CT Chest data 03/05/20   Narrative & Impression  CLINICAL DATA: 75 year old female with history of shortness of breath on exertion. History of rheumatoid arthritis.  EXAM: CT CHEST WITHOUT CONTRAST  TECHNIQUE: Multidetector CT imaging of the chest was performed following the standard protocol without intravenous contrast. High resolution imaging of the lungs, as well as inspiratory and expiratory imaging, was performed.  COMPARISON: High-resolution chest CT 08/04/2018.  FINDINGS: Cardiovascular: Heart size is normal. There is no significant pericardial fluid, thickening or pericardial  calcification. There is aortic atherosclerosis, as well as atherosclerosis of the great vessels of the mediastinum and the coronary arteries, including calcified atherosclerotic plaque in the left main, left anterior descending and right coronary arteries.  Mediastinum/Nodes: No pathologically enlarged mediastinal or hilar lymph nodes. Please note that accurate exclusion of hilar adenopathy is limited on noncontrast CT scans. Esophagus is unremarkable in appearance. No axillary lymphadenopathy.  Lungs/Pleura: High-resolution images again demonstrate minimal ground-glass attenuation in the extreme lung bases. Trace amount of septal thickening noted, most evident in the inferior segment of the lingula and lateral aspect of the left lower lobe. No traction bronchiectasis or honeycombing. Inspiratory and expiratory imaging demonstrates moderate air trapping indicative of small airways disease. No acute consolidative airspace disease. No pleural effusions. No suspicious appearing pulmonary nodules or masses are noted.  Upper Abdomen: Aortic atherosclerosis.  Musculoskeletal: There are no aggressive appearing lytic or blastic lesions noted in the visualized portions of the skeleton.  IMPRESSION: 1. Very subtle changes suggestive of interstitial lung disease redemonstrated, with minimal progression compared to  the prior study. Findings are extremely mild and considered indeterminate for usual interstitial pneumonia (UIP) per current ATS guidelines. If there is persistent clinical concern for interstitial lung disease, repeat high-resolution chest CT is recommended in 12 months to assess for temporal changes in the appearance of the lung parenchyma. 2. Moderate air trapping indicative of small airways disease. 3. Aortic atherosclerosis, in addition to left main and 2 vessel coronary artery disease. Assessment for potential risk factor modification, dietary therapy or pharmacologic  therapy may be warranted, if clinically indicated.  Aortic Atherosclerosis (ICD10-I70.0).   Electronically Signed By: Vinnie Langton M.D. On: 03/06/2020 08:59         OV 06/07/2020  Subjective:  Patient ID: [name], female , DOB: [birth year] , age 77 y.o. , MRN: [id number] , ADDRESS: [place] [id number] PCP Tower, [name], MD Patient Care Team: Tower, [name], MD as PCP - Kennon Portela, MD as Consulting Physician (Dermatology) Juanita Craver, MD as Consulting Physician (Gastroenterology) Marica Otter, OD as Consulting Physician (Optometry) Belva Crome, MD as Consulting Physician (Cardiology)  This Provider for this visit: Treatment Team:  Attending Provider: Brand Males, MD    06/07/2020 -  Presents for bronch BAL    HPI [name] 75 y.o. -presents for bronchoscopy.  Last seen in March 2022.  It has been voluntary basis after history and physical.  She tells me no interim changes.  She continues on immunosuppression.  She still has shortness of breath she also has a mild cough.  N.p.o. status confirmed.  No new medical problems.  No changes to her medications.  Although she does feel her rheumatoid arthritis is acting up and she would like benefit from a change in medication.  She will talk to Dr. Keturah Barre about this.  I personally visualized the CT scan for the procedure today.  All questions answered.  This procedure has been rescheduled because I the operator had to be called out on sick leave.    CT Chest data 03/05/20  CLINICAL DATA:  75 year old female with history of shortness of breath on exertion. History of rheumatoid arthritis.  EXAM: CT CHEST WITHOUT CONTRAST  TECHNIQUE: Multidetector CT imaging of the chest was performed following the standard protocol without intravenous contrast. High resolution imaging of the lungs, as well as inspiratory and expiratory imaging, was performed.  COMPARISON:   High-resolution chest CT 08/04/2018.  FINDINGS: Cardiovascular: Heart size is normal. There is no significant pericardial fluid, thickening or pericardial calcification. There is aortic atherosclerosis, as well as atherosclerosis of the great vessels of the mediastinum and the coronary arteries, including calcified atherosclerotic plaque in the left main, left anterior descending and right coronary arteries.  Mediastinum/Nodes: No pathologically enlarged mediastinal or hilar lymph nodes. Please note that accurate exclusion of hilar adenopathy is limited on noncontrast CT scans. Esophagus is unremarkable in appearance. No axillary lymphadenopathy.  Lungs/Pleura: High-resolution images again demonstrate minimal ground-glass attenuation in the extreme lung bases. Trace amount of septal thickening noted, most evident in the inferior segment of the lingula and lateral aspect of the left lower lobe. No traction bronchiectasis or honeycombing. Inspiratory and expiratory imaging demonstrates moderate air trapping indicative of small airways disease. No acute consolidative airspace disease. No pleural effusions. No suspicious appearing pulmonary nodules or masses are noted.  Upper Abdomen: Aortic atherosclerosis.  Musculoskeletal: There are no aggressive appearing lytic or blastic lesions noted in the visualized portions of the skeleton.  IMPRESSION: 1. Very subtle changes suggestive  of interstitial lung disease redemonstrated, with minimal progression compared to the prior study. Findings are extremely mild and considered indeterminate for usual interstitial pneumonia (UIP) per current ATS guidelines. If there is persistent clinical concern for interstitial lung disease, repeat high-resolution chest CT is recommended in 12 months to assess for temporal changes in the appearance of the lung parenchyma. 2. Moderate air trapping indicative of small airways disease. 3. Aortic  atherosclerosis, in addition to left main and 2 vessel coronary artery disease. Assessment for potential risk factor modification, dietary therapy or pharmacologic therapy may be warranted, if clinically indicated.  Aortic Atherosclerosis (ICD10-I70.0).   Electronically Signed   By: Vinnie Langton M.D.   On: 03/06/2020 08:59   No results found.    PFT  PFT Results Latest Ref Rng & Units 09/01/2013  FVC-Pre L 2.78  FVC-Predicted Pre % 83  FVC-Post L 2.86  FVC-Predicted Post % 86  Pre FEV1/FVC % % 73  Post FEV1/FCV % % 80  FEV1-Pre L 2.03  FEV1-Predicted Pre % 80  FEV1-Post L 2.29  DLCO uncorrected ml/min/mmHg 20.14  DLCO UNC% % 74  DLVA Predicted % 99  TLC L 5.47  TLC % Predicted % 102  RV % Predicted % 101       has a past medical history of Asthma, Constipation, Fibroid, GERD (gastroesophageal reflux disease), Insomnia, Lumbar vertebral fracture ([place]), Menopausal symptoms, Osteoporosis, Postmenopausal HRT (hormone replacement therapy), RA (rheumatoid arthritis) ([place]) (11/2004), Rosacea, Sjogren's disease ([place]), Sleep apnea (2008), and Vitamin D deficiency.   reports that she has never smoked. She has never used smokeless tobacco.  Past Surgical History:  Procedure Laterality Date  . ABDOMINAL HYSTERECTOMY  01/1993   partial with AP repair, secondary to fibroids  . BREAST SURGERY  1991   breast biopsy  . CYSTOCELE REPAIR  10/2007   with graft  . MELANOMA EXCISION Right 01/2012   excision right posterior arm  . NASAL SINUS SURGERY  07/1991  . VAGINAL DELIVERY     x3    Allergies  Allergen Reactions  . Adalimumab     REACTION: frequent sinus infection  . Diclofenac     unk  . Dicyclomine     Turn teeth gray  . Doxycycline Hyclate Other (See Comments)    "turns teeth gray"  . Enbrel [Etanercept]     SINUS INFECTIONS  . Fosamax [Alendronate Sodium]     Acid reflux  . Methotrexate Derivatives Nausea Only    Oral metotrexate  . Nickel   .  Piroxicam     unk  . Salagen [Pilocarpine Hcl] Other (See Comments)    Excessive sweating, headaches  . Sulfamethoxazole-Trimethoprim     REACTION: face and hands rash and felt burning sensation  . Tramadol Hcl     REACTION: nausea  . Plaquenil [Hydroxychloroquine] Rash    Immunization History  Administered Date(s) Administered  . DTaP 04/06/2013  . Influenza Split 10/22/2010  . Influenza Whole 10/10/2009, 10/20/2011  . Influenza, High Dose Seasonal PF 10/14/2016, 10/08/2018, 10/07/2019  . Influenza, Seasonal, Injecte, Preservative Fre 10/06/2012  . Influenza,inj,Quad PF,6+ Mos 10/30/2014  . Influenza,inj,quad, With Preservative 10/12/2017  . Influenza-Unspecified 10/26/2013, 10/22/2015  . PFIZER(Purple Top)SARS-COV-2 Vaccination 06/02/2019, 08/26/2019  . Pneumococcal Conjugate-13 02/13/2014  . Pneumococcal Polysaccharide-23 06/12/2010, 03/28/2015  . Td 02/07/2003, 03/23/2013  . Tdap 03/23/2013  . Zoster, Live 05/08/2014    Family History  Problem Relation Age of Onset  . Breast cancer Sister 48  . Thyroid disease Sister   .  Cancer Sister 4       Leukemia--CLL  . Heart disease Brother      Current Facility-Administered Medications:  .  butamben-tetracaine-benzocaine (CETACAINE) spray 1 spray, 1 spray, Topical, Once, Brand Males, MD .  lactated ringers infusion, , Intravenous, Continuous, Myrla Malanowski, MD, Last Rate: 10 mL/hr at 06/07/20 0840, New Bag at 06/07/20 0840 .  lidocaine (XYLOCAINE) 2 % jelly 1 application, 1 application, Topical, Once, Corgan Mormile, MD .  phenylephrine (NEO-SYNEPHRINE) 0.25 % nasal spray 1 spray, 1 spray, Each Nare, Q6H PRN, Brand Males, MD      Objective:   Vitals:   06/07/20 0832  BP: (!) 147/65  Pulse: (!) 58  Resp: 13  Temp: 97.7 F (36.5 C)  TempSrc: Oral  SpO2: 98%  Weight: 79.4 kg  Height: _0  (1.702 m)    Estimated body mass index is 27.41 kg/m as calculated from the following:   Height as of  this encounter: _1  (1.702 m).   Weight as of this encounter: 79.4 kg.  Weight change:   Filed Weights   06/07/20 0832  Weight: 79.4 kg     Physical Exam   General: No distress.  Looks well Neuro: Alert and Oriented x 3. GCS 15. Speech normal Psych: Pleasant Resp:  Barrel Chest -no.  Wheeze -no, Crackles -?  Possible crackles in the lung base but unsure of, No overt respiratory distress CVS: Normal heart sounds. Murmurs -no Ext: Stigmata of Connective Tissue Disease -no HEENT: Normal upper airway. PEERL +. No post nasal drip        Assessment:     Preoperative respiratory exam Interstitial lung disease Shortness of breath Cough Immunosuppressed     Plan:     Risks of pneumothorax, hemothorax, sedation/anesthesia complications such as cardiac or respiratory arrest or hypotension, stroke and bleeding all explained. Benefits of diagnosis but limitations of non-diagnosis also explained. Patient verbalized understanding and wished to proceed.   BAL planned   SIGNATURE    Dr. Brand Males, M.D., F.C.C.P,  Pulmonary and Critical Care Medicine Staff Physician, [place] Director - Interstitial Lung Disease  Program  Pulmonary [place] [facility] [place], [place], [address]  Pager: [phone], If no answer or between  15:00h - 7:00h: call [phone] Telephone: [phone]  8:48 AM 06/07/2020

## 2020-06-07 NOTE — Op Note (Addendum)
Name:  Deanna Norris MRN:  [id number] DOB:  07/20/1945  PROCEDURE NOTE  Procedure(s): Flexible bronchoscopy [phone]) Bronchial alveolar lavage [id number]) of the Right Lower Lobe   Indications:  Early ILD, dyspnea, cough, Immunecompromised  Consent:  Procedure, benefits, risks and alternatives discussed.  Questions answered.  Consent obtained.  Anesthesia:  Moderate Sedation  Location: [place]  Procedure summary:  Appropriate equipment was assembled.  The patient was brought to the procedure suite room and identified as [name] with 1945-10-24  Safety timeout was performed. The patient was placed supine on the  table, airway and moderate sedation administered by this operator  After the appropriate level of moderation was assured, flexible video bronchoscope was lubricated and inserted through the right naris Total of 6 mL of 1% Lidocaine were administered through the bronchoscope to augment moderate sedation. Total fent 150mg and vserv 485m Prior to all this she got lidocaine nebulizer 4% at5M L and nasal gel  Airway examination was yes performed bilaterally to subsegmental level.  Minimal clear secretions were noted, mucosa appeared normal and no endobronchial lesions were identified.  Bronchial alveolar lavage of the right lower lobe was performed with 20 mL of normal saline  X 1 to cannister and then 401m 3 to leukens trap Total return of 40  mL of fluid, after which the bronchoscope was withdrawn.  Clear Grey fluid  After hemostasis was assure, the bronchoscope was withdrawn.  The patient was recovered and then  transferred to recovery area  Post-procedure chest x-ray was NO ordered.  Specimens sent: Bronchial alveolar lavage specimen of the Right Lower Lobe for cell count , microbiology and cytology.  Complications:  No immediate complications were noted.  Hemodynamic parameters and oxygenation remained stable throughout the procedure.  Estimated blood  loss:  none  IMPRESSION 1. Normal airway 2. Right Lower Lobe BAL 3. EnvClabe Sealassifer sent - YES NO   Husband updated post procedure  Followup Future Appointments  Date Time Provider [place]/13/2022  8:30 AM [facility] LAB [facility] P[facility]  07/02/2020  9:30 AM Tower, MarWynelle FannyD [facility] PEC  07/06/2020  1:30 PM MC-PULMONARY REHAB UNDERGRAD [facility] None  07/10/2020  8:15 AM [facility] NURSE HEALTH ADVISOR [facility] PEC  07/10/2020  9:30 AM MC-SCREENING [facility] None  07/10/2020  1:15 PM MC-PULMONARY REHAB UNDERGRAD [facility] None  07/11/2020  2:00 PM [facility]-PFT RM [facility]ULC[facility] None  07/11/2020  3:00 PM RamBrand MalesD [facility]PULCARE None  07/12/2020  1:15 PM MC-PULMONARY REHAB UNDERGRAD [facility] None  07/13/2020 10:30 AM DevBo MerinoD [facility] None  07/17/2020  1:15 PM MC-PULMONARY REHAB UNDERGRAD [facility] None  07/19/2020  1:15 PM MC-PULMONARY REHAB UNDERGRAD [facility] None  07/24/2020  1:15 PM MC-PULMONARY REHAB UNDERGRAD [facility] None  07/26/2020  1:15 PM MC-PULMONARY REHAB UNDERGRAD [facility] None  07/31/2020  1:15 PM MC-PULMONARY REHAB UNDERGRAD [facility] None  08/02/2020  1:15 PM MC-PULMONARY REHAB UNDERGRAD [facility] None  08/07/2020  1:15 PM MC-PULMONARY REHAB UNDERGRAD [facility] None  08/09/2020  1:15 PM MC-PULMONARY REHAB UNDERGRAD [facility] None  08/14/2020  1:15 PM MC-PULMONARY REHAB UNDERGRAD [facility] None  08/16/2020  1:15 PM MC-PULMONARY REHAB UNDERGRAD [facility] None  08/21/2020  1:15 PM MC-PULMONARY REHAB UNDERGRAD [facility] None  08/23/2020  1:15 PM MC-PULMONARY REHAB UNDERGRAD [facility] None  08/28/2020  1:15 PM MC-PULMONARY REHAB UNDERGRAD [facility] None  08/30/2020  1:15 PM MC-PULMONARY REHAB UNDERGRAD [facility] None  09/04/2020  1:15 PM MC-PULMONARY REHAB UNDERGRAD [facility] None  09/06/2020  1:15 PM MC-PULMONARY REHAB UNDERGRAD [facility] None  04/03/2021 11:00 AM Amundson Raliegh Ip, MD [facility] None     Dr. Brand Males, M.D., [facility].C.P Pulmonary and Critical Care Medicine Staff  Physician [place] [facility] Pager: [phone], If no answer or between  15:00h - 7:00h: call [phone]  06/07/2020 9:31 AM

## 2020-06-08 ENCOUNTER — Encounter (HOSPITAL_COMMUNITY): Payer: Self-pay | Admitting: Internal Medicine

## 2020-06-08 ENCOUNTER — Telehealth: Payer: Self-pay

## 2020-06-08 LAB — PATHOLOGIST SMEAR REVIEW

## 2020-06-08 LAB — ACID FAST SMEAR (AFB, MYCOBACTERIA): Acid Fast Smear: NEGATIVE

## 2020-06-08 LAB — CYTOLOGY - NON PAP

## 2020-06-08 NOTE — Telephone Encounter (Signed)
I called patient, patient verbalized understanding. 

## 2020-06-08 NOTE — Telephone Encounter (Signed)
It does not appear that all the cultures have resulted yet.  I would recommend waiting until she has heard from Dr. [name] about the results and next steps.   If she does have an active infection she will need to hold orencia and MTX until the infection has been treated.

## 2020-06-08 NOTE — Telephone Encounter (Signed)
Patient called stating she had a bronchoscopy yesterday with Dr. [name].  Patient states she saw on MyChart that she has an active infection in her lung and wanted to know if she should stop taking her RA medication.  Patient states she has been feeling tired and has some pain in her hands, neck, and hips.  Patient states she has not discussed the results with Dr. [name], but since she noticed she had an active infection wanted to check if she should stop taking her medication.

## 2020-06-09 LAB — CULTURE, RESPIRATORY W GRAM STAIN: Culture: NORMAL

## 2020-06-11 ENCOUNTER — Other Ambulatory Visit (HOSPITAL_COMMUNITY): Payer: Medicare Other

## 2020-06-11 NOTE — Telephone Encounter (Signed)
Dr. [name], This message was received this morning for you.    The body fluid cell count test showed bacteria present. The pathologist smear review showed bacteria present.  Does this mean I have an infection and should I begin on an antibiotic before my July 6 appointment?  I stopped taking Methotrexate and Orencia, per Dr Arlean Hopping PA, due to these results. Is this appropriate?  Thank you for caring. [place] [phone]

## 2020-06-11 NOTE — Telephone Encounter (Signed)
    The final bacterial culture is negative.  Growth normal flora.  So the bacteria this on the smear was probably the normal flora.  However this is just for regular bacteria.  Fungal cultures and tuberculosis cultures are still pending but this could take weeks  Plan  - Okay to restart her autoimmune drugs.  We will keep an eye for other cultures.  That could take weeks.  I am worried that if she stops autoimmune drugs her pain from RA will come back  WBC PRESENT,BOTH PMN AND MONONUCLEAR  GRAM POSITIVE COCCI IN PAIRS IN CLUSTERS  CYTOSPIN SMEAR    Culture RARE Consistent with normal respiratory flora.  No Pseudomonas species isolated  Performed at [facility] [address], [place], [place] [address]    Results for Deanna Norris, Deanna Norris (MRN [id number]) as of 06/11/2020 17:26  Ref. Range 06/07/2020 09:30  Total Nucleated Cell Count, Fluid Latest Ref Range: 0 - 1,000 cu mm 47  Lymphs, Fluid Latest Units: % 16  Eos, Fluid Latest Units: % 2  Appearance, Fluid Latest Ref Range: CLEAR  HAZY (A)  Other Cells, Fluid Latest Units: % LINING CELLS PRESENT  Neutrophil Count, Fluid Latest Ref Range: 0 - 25 % 74 (H)  Monocyte-Macrophage-Serous Fluid Latest Ref Range: 50 - 90 % 8 (L)

## 2020-06-12 LAB — MTB RIF NAA W/O CULTURE, SPUTUM

## 2020-06-13 ENCOUNTER — Ambulatory Visit: Payer: Medicare Other | Admitting: Internal Medicine

## 2020-06-17 ENCOUNTER — Telehealth: Payer: Self-pay | Admitting: Family Medicine

## 2020-06-17 DIAGNOSIS — M810 Age-related osteoporosis without current pathological fracture: Secondary | ICD-10-CM

## 2020-06-17 DIAGNOSIS — E7800 Pure hypercholesterolemia, unspecified: Secondary | ICD-10-CM

## 2020-06-17 DIAGNOSIS — M0579 Rheumatoid arthritis with rheumatoid factor of multiple sites without organ or systems involvement: Secondary | ICD-10-CM

## 2020-06-17 DIAGNOSIS — H04123 Dry eye syndrome of bilateral lacrimal glands: Secondary | ICD-10-CM

## 2020-06-17 DIAGNOSIS — Z79899 Other long term (current) drug therapy: Secondary | ICD-10-CM

## 2020-06-17 NOTE — Telephone Encounter (Signed)
-----   Message from Cloyd Stagers, RT sent at 06/05/2020 12:05 PM EDT ----- Regarding: Lab Orders for Monday 6.13.2022 Please place lab orders for Monday 6.13.2022, office visit for physical on Monday 6.27.2022 Thank you, Dyke Maes RT(R)

## 2020-06-18 ENCOUNTER — Ambulatory Visit: Payer: Medicare Other

## 2020-06-18 ENCOUNTER — Other Ambulatory Visit: Payer: Self-pay

## 2020-06-18 ENCOUNTER — Other Ambulatory Visit (INDEPENDENT_AMBULATORY_CARE_PROVIDER_SITE_OTHER): Payer: Medicare Other

## 2020-06-18 DIAGNOSIS — M0579 Rheumatoid arthritis with rheumatoid factor of multiple sites without organ or systems involvement: Secondary | ICD-10-CM

## 2020-06-18 DIAGNOSIS — M810 Age-related osteoporosis without current pathological fracture: Secondary | ICD-10-CM | POA: Diagnosis not present

## 2020-06-18 DIAGNOSIS — Z79899 Other long term (current) drug therapy: Secondary | ICD-10-CM

## 2020-06-18 DIAGNOSIS — E7800 Pure hypercholesterolemia, unspecified: Secondary | ICD-10-CM

## 2020-06-18 DIAGNOSIS — H04123 Dry eye syndrome of bilateral lacrimal glands: Secondary | ICD-10-CM | POA: Diagnosis not present

## 2020-06-18 LAB — COMPREHENSIVE METABOLIC PANEL
ALT: 23 U/L (ref 0–35)
AST: 22 U/L (ref 0–37)
Albumin: 4 g/dL (ref 3.5–5.2)
Alkaline Phosphatase: 72 U/L (ref 39–117)
BUN: 11 mg/dL (ref 6–23)
CO2: 26 mEq/L (ref 19–32)
Calcium: 8.7 mg/dL (ref 8.4–10.5)
Chloride: 107 mEq/L (ref 96–112)
Creatinine, Ser: 0.66 mg/dL (ref 0.40–1.20)
GFR: 86.26 mL/min (ref >60.00)
Glucose, Bld: 90 mg/dL (ref 70–99)
Potassium: 4 mEq/L (ref 3.5–5.1)
Sodium: 140 mEq/L (ref 135–145)
Total Bilirubin: 0.7 mg/dL (ref 0.2–1.2)
Total Protein: 6.3 g/dL (ref 6.0–8.3)

## 2020-06-18 LAB — CBC WITH DIFFERENTIAL/PLATELET
Basophils Absolute: 0.1 10*3/uL (ref 0.0–0.1)
Basophils Relative: 1.2 % (ref 0.0–3.0)
Eosinophils Absolute: 0.4 10*3/uL (ref 0.0–0.7)
Eosinophils Relative: 5.4 % — ABNORMAL HIGH (ref 0.0–5.0)
HCT: 47.5 % — ABNORMAL HIGH (ref 36.0–46.0)
Hemoglobin: 15.7 g/dL — ABNORMAL HIGH (ref 12.0–15.0)
Lymphocytes Relative: 29.9 % (ref 12.0–46.0)
Lymphs Abs: 2 10*3/uL (ref 0.7–4.0)
MCHC: 33.1 g/dL (ref 30.0–36.0)
MCV: 97.1 fl (ref 78.0–100.0)
Monocytes Absolute: 0.8 10*3/uL (ref 0.1–1.0)
Monocytes Relative: 11.8 % (ref 3.0–12.0)
Neutro Abs: 3.4 10*3/uL (ref 1.4–7.7)
Neutrophils Relative %: 51.7 % (ref 43.0–77.0)
Platelets: 225 10*3/uL (ref 150.0–400.0)
RBC: 4.89 Mil/uL (ref 3.87–5.11)
RDW: 14.8 % (ref 11.5–15.5)
WBC: 6.5 10*3/uL (ref 4.0–10.5)

## 2020-06-18 LAB — MTB RIF NAA NON-SPUTUM, W/O CULTURE

## 2020-06-18 LAB — VITAMIN D 25 HYDROXY (VIT D DEFICIENCY, FRACTURES): VITD: 63.91 ng/mL (ref 30.00–100.00)

## 2020-06-18 LAB — LIPID PANEL
Cholesterol: 215 mg/dL — ABNORMAL HIGH (ref 0–200)
HDL: 55.6 mg/dL (ref >39.00)
LDL Cholesterol: 129 mg/dL — ABNORMAL HIGH (ref 0–99)
NonHDL: 159.1
Total CHOL/HDL Ratio: 4
Triglycerides: 151 mg/dL — ABNORMAL HIGH (ref 0.0–149.0)
VLDL: 30.2 mg/dL (ref 0.0–40.0)

## 2020-06-18 LAB — TSH: TSH: 2.8 u[IU]/mL (ref 0.35–4.50)

## 2020-06-20 ENCOUNTER — Telehealth: Payer: Self-pay | Admitting: Family Medicine

## 2020-06-20 NOTE — Telephone Encounter (Signed)
LVM for pt to rtn my call to r/s appt with nha on 07/10/20

## 2020-06-22 ENCOUNTER — Other Ambulatory Visit: Payer: Self-pay

## 2020-06-22 DIAGNOSIS — Z79899 Other long term (current) drug therapy: Secondary | ICD-10-CM | POA: Diagnosis not present

## 2020-06-23 LAB — CBC WITH DIFFERENTIAL/PLATELET
Absolute Monocytes: 821 cells/uL (ref 200–950)
Basophils Absolute: 81 cells/uL (ref 0–200)
Basophils Relative: 1.1 %
Eosinophils Absolute: 348 cells/uL (ref 15–500)
Eosinophils Relative: 4.7 %
HCT: 47 % — ABNORMAL HIGH (ref 35.0–45.0)
Hemoglobin: 15.9 g/dL — ABNORMAL HIGH (ref 11.7–15.5)
Lymphs Abs: 2679 cells/uL (ref 850–3900)
MCH: 32.4 pg (ref 27.0–33.0)
MCHC: 33.8 g/dL (ref 32.0–36.0)
MCV: 95.9 fL (ref 80.0–100.0)
MPV: 11.5 fL (ref 7.5–12.5)
Monocytes Relative: 11.1 %
Neutro Abs: 3471 cells/uL (ref 1500–7800)
Neutrophils Relative %: 46.9 %
Platelets: 254 10*3/uL (ref 140–400)
RBC: 4.9 10*6/uL (ref 3.80–5.10)
RDW: 13.9 % (ref 11.0–15.0)
Total Lymphocyte: 36.2 %
WBC: 7.4 10*3/uL (ref 3.8–10.8)

## 2020-06-23 LAB — COMPLETE METABOLIC PANEL WITH GFR
AG Ratio: 1.8 (calc) (ref 1.0–2.5)
ALT: 28 U/L (ref 6–29)
AST: 26 U/L (ref 10–35)
Albumin: 3.9 g/dL (ref 3.6–5.1)
Alkaline phosphatase (APISO): 82 U/L (ref 37–153)
BUN: 12 mg/dL (ref 7–25)
CO2: 25 mmol/L (ref 20–32)
Calcium: 9.2 mg/dL (ref 8.6–10.4)
Chloride: 107 mmol/L (ref 98–110)
Creat: 0.67 mg/dL (ref 0.60–0.93)
GFR, Est African American: 100 mL/min/{1.73_m2} (ref >=60)
GFR, Est Non African American: 87 mL/min/{1.73_m2} (ref >=60)
Globulin: 2.2 g/dL (calc) (ref 1.9–3.7)
Glucose, Bld: 117 mg/dL — ABNORMAL HIGH (ref 65–99)
Potassium: 4.4 mmol/L (ref 3.5–5.3)
Sodium: 140 mmol/L (ref 135–146)
Total Bilirubin: 0.4 mg/dL (ref 0.2–1.2)
Total Protein: 6.1 g/dL (ref 6.1–8.1)

## 2020-06-25 ENCOUNTER — Ambulatory Visit: Payer: Medicare Other | Admitting: Family Medicine

## 2020-06-25 NOTE — Progress Notes (Signed)
Hemoglobin and hematocrit remain borderline elevated. Rest of CBC WNL.  Glucose is 117. Rest of CMP WNL.

## 2020-06-28 ENCOUNTER — Telehealth: Payer: Self-pay | Admitting: Pharmacist

## 2020-06-28 NOTE — Telephone Encounter (Signed)
Please start Reclast BIV through medical benefit.  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

## 2020-06-28 NOTE — Telephone Encounter (Signed)
I spoke with Dr. Estanislado Pandy.  Ok to proceed with 5th reclast infusion.  We will repeat DEXA in February 2023.

## 2020-06-28 NOTE — Telephone Encounter (Signed)
Patient's Reclast history is as follows: 06/19/16 06/29/17 07/08/2018 07/20/2019  Next Reclast (dose #5) due 07/19/20  DEXA 02/07/2019  Lowest T-score at Left Femoral Neck was -2.9 DEXA 02/04/2018  Lowest T-score at Left Femoral Neck was -3.1  Routing to Hazel Sams, PA-C   Knox Saliva, PharmD, MPH Clinical Pharmacist (Rheumatology and Pulmonology)

## 2020-07-02 ENCOUNTER — Encounter: Payer: Self-pay | Admitting: Family Medicine

## 2020-07-02 ENCOUNTER — Other Ambulatory Visit: Payer: Self-pay

## 2020-07-02 ENCOUNTER — Ambulatory Visit (INDEPENDENT_AMBULATORY_CARE_PROVIDER_SITE_OTHER): Payer: Medicare Other | Admitting: Family Medicine

## 2020-07-02 VITALS — BP 126/76 | HR 70 | Temp 97.9°F | Ht 66.0 in | Wt 179.2 lb

## 2020-07-02 DIAGNOSIS — J849 Interstitial pulmonary disease, unspecified: Secondary | ICD-10-CM | POA: Diagnosis not present

## 2020-07-02 DIAGNOSIS — M0579 Rheumatoid arthritis with rheumatoid factor of multiple sites without organ or systems involvement: Secondary | ICD-10-CM

## 2020-07-02 DIAGNOSIS — M810 Age-related osteoporosis without current pathological fracture: Secondary | ICD-10-CM | POA: Diagnosis not present

## 2020-07-02 DIAGNOSIS — E7800 Pure hypercholesterolemia, unspecified: Secondary | ICD-10-CM

## 2020-07-02 DIAGNOSIS — K219 Gastro-esophageal reflux disease without esophagitis: Secondary | ICD-10-CM

## 2020-07-02 MED ORDER — FAMOTIDINE 40 MG PO TABS: 40.0000 mg | ORAL_TABLET | Freq: Two times a day (BID) | ORAL | 3 refills | Status: DC

## 2020-07-02 NOTE — Assessment & Plan Note (Signed)
Continues rheumatology and pulm f/u  May be RA related

## 2020-07-02 NOTE — Assessment & Plan Note (Addendum)
Under care of rheumatology  Both lung and joint inv  Taking orencia and mtx and folic acid She notes flares after stress or fatigue Often causing ST Today some ulcers noted in throat/soft palate area  Recommend salt water gargle and f/u if no imp

## 2020-07-02 NOTE — Assessment & Plan Note (Signed)
Taking high dose pepcid  40 mg bid  occ breakthrough symptoms if she eats the wrong thing Disc diet control  Consider change to PPI if not well controlled with appropriate diet

## 2020-07-02 NOTE — Assessment & Plan Note (Signed)
dexa 2/21 On reclast-managed by rheumatology  Taking ca and D with tx D level  On falls or fx Good exercise

## 2020-07-02 NOTE — Telephone Encounter (Signed)
Findings of benefits investigation for Reclast :   Insurance: Medicare and [place] are ACTIVE   Medicare covers 80% of the infusion and no authorization is required, and the patient's Tricare is secondary and would cover the 20% of the cost that was not paid for by Medicare as long as Medicare covered the medication. Tricare also covers the patient's Medicare deductible.  3:28 PM Beatriz Chancellor, CPhT

## 2020-07-02 NOTE — Patient Instructions (Addendum)
If you are interested in the new shingles vaccine (Shingrix) - call your local pharmacy to check on coverage and availability  If affordable, get on a wait list at your pharmacy to get the vaccine.   Avoid what flares your acid reflux  If then you still have breakthrough symptoms please let me know   For cholesterol  Avoid red meat/ fried foods/ egg yolks/ fatty breakfast meats/ butter, cheese and high fat dairy/ and shellfish

## 2020-07-02 NOTE — Assessment & Plan Note (Signed)
Disc goals for lipids and reasons to control them Rev last labs with pt Rev low sat fat diet in detail  LDL is improved at 129 Trig 151 HDL good  Will continue to work on diet and monitor

## 2020-07-02 NOTE — Progress Notes (Signed)
Subjective:    Patient ID: Deanna Norris, female    DOB: July 31, 1945, 75 y.o.   MRN: [id number]  This visit occurred during the SARS-CoV-2 public health emergency.  Safety protocols were in place, including screening questions prior to the visit, additional usage of staff PPE, and extensive cleaning of exam room while observing appropriate contact time as indicated for disinfecting solutions.   HPI Pt presents for f/u of chronic health problems incl OP and GERD and all rhinitis and RA and hyperlipidemia   Wt Readings from Last 3 Encounters:  07/02/20 179 lb 4 oz (81.3 kg)  06/07/20 175 lb (79.4 kg)  04/04/20 180 lb 9.6 oz (81.9 kg)   28.93 kg/m  Overall doing fine but starting to feel old   Considering a move to [place] -daughter lives there  My build a home close to them and go back and forth for a while   Problems with ILD-breathing and seeing pulm  Had bronchoscopy  ? RA lung dz  Also poss all rxn to mtx   Using pulmicort   Covid immunized - did not have booster  Rheumatology was unsure- due to her auto immune issues  Had covid as well    Zoster status - RA suggests shingrix  Considering it    Mammogram 2/22 Self breast exam-no lumps   Colonoscopy 2015  Some hemorrhoids   Dexa 2/21 OP-improved a little  On reclast (with rheumatologist) Takes ca and D , plus more D Walks 20 min in the am  No falls  No fractures   GERD- she takes pepcid 40 mg bid  Gets occ acid in throat  Unsure if that is part of the lung issues as well  Vit D 63.9  Gets a ST when RA acts up   RA is progressing  More problems with rashes/itching/ eye issues and lungs  Is more tired   Lab Results  Component Value Date   WBC 7.4 06/22/2020   HGB 15.9 (H) 06/22/2020   HCT 47.0 (H) 06/22/2020   MCV 95.9 06/22/2020   PLT 254 06/22/2020   Lab Results  Component Value Date   CREATININE 0.67 06/22/2020   BUN 12 06/22/2020   NA 140 06/22/2020   K 4.4 06/22/2020   CL 107  06/22/2020   CO2 25 06/22/2020   Lab Results  Component Value Date   ALT 28 06/22/2020   AST 26 06/22/2020   ALKPHOS 72 06/18/2020   BILITOT 0.4 06/22/2020    Lab Results  Component Value Date   TSH 2.80 06/18/2020     Hyperlipidemia  Lab Results  Component Value Date   CHOL 215 (H) 06/18/2020   CHOL 216 (H) 06/16/2019   CHOL 228 (H) 06/09/2018   Lab Results  Component Value Date   HDL 55.60 06/18/2020   HDL 59.50 06/16/2019   HDL 68.10 06/09/2018   Lab Results  Component Value Date   LDLCALC 129 (H) 06/18/2020   LDLCALC 141 (H) 06/16/2019   LDLCALC 134 (H) 06/09/2018   Lab Results  Component Value Date   TRIG 151.0 (H) 06/18/2020   TRIG 78.0 06/16/2019   TRIG 133.0 06/09/2018   Lab Results  Component Value Date   CHOLHDL 4 06/18/2020   CHOLHDL 4 06/16/2019   CHOLHDL 3 06/09/2018   Lab Results  Component Value Date   LDLDIRECT 144.2 02/02/2013   LDLDIRECT 137.3 02/02/2012  The 10-year ASCVD risk score Mikey Bussing DC Jr., et al., 2013) is: 14.2%  Values used to calculate the score:     Age: 56 years     Sex: Female     Is Non-Hispanic African American: No     Diabetic: No     Tobacco smoker: No     Systolic Blood Pressure: 220 mmHg     Is BP treated: No     HDL Cholesterol: 55.6 mg/dL     Total Cholesterol: 215 mg/dL  Patient Active Problem List   Diagnosis Date Noted   Preoperative respiratory examination 06/07/2020   ILD (interstitial lung disease) ([place])    Hearing loss 07/10/2019   Allergic rhinitis 04/08/2018   Immunocompromised state ([place]) 02/17/2018   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   Primary osteoarthritis of both hands 01/11/2016   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/11/2016   High risk medication use 11/15/2015   Sjogren's syndrome ([place]) 10/31/2015   Chronic constipation 10/23/2015   Rosacea 10/23/2015   Rheumatoid  arthritis ([place]) 03/29/2015   Coronary artery calcification seen on CT scan 11/17/2013   Recurrent sinusitis 09/16/2013   Encounter for Medicare annual wellness exam 01/31/2013   Dyspnea on exertion 09/14/2012   Nodule of right lung 09/14/2012   Hyperlipidemia 05/08/2009   Tear film insufficiency 05/08/2009   Asthma 05/08/2009   GERD 05/08/2009   MENOPAUSAL DISORDER 05/08/2009   ARTHRITIS, RHEUMATOID 05/08/2009   Osteoporosis 05/08/2009   SPONDYLOLISTHESIS 05/08/2009   Sleep apnea 05/08/2009   Past Medical History:  Diagnosis Date   Asthma    Constipation    Fibroid    GERD (gastroesophageal reflux disease)    Insomnia    Lumbar vertebral fracture ([facility])    L5-S1   Menopausal symptoms    Osteoporosis    Postmenopausal HRT (hormone replacement therapy)    RA (rheumatoid arthritis) ([place]) 11/2004   methotrexate and Embrel   Rosacea    Sjogren's disease ([place])    Sleep apnea 2008   C-Pap   Vitamin D deficiency    Past Surgical History:  Procedure Laterality Date   ABDOMINAL HYSTERECTOMY  01/1993   partial with AP repair, secondary to fibroids   BREAST SURGERY  1991   breast biopsy   BRONCHIAL WASHINGS  06/07/2020   Procedure: BRONCHIAL WASHINGS;  Surgeon: Brand Males, MD;  Location: [facility];  Service: Endoscopy;;   CYSTOCELE REPAIR  10/2007   with graft   MELANOMA EXCISION Right 01/2012   excision right posterior arm   NASAL SINUS SURGERY  07/1991   VAGINAL DELIVERY     x3   VIDEO BRONCHOSCOPY N/A 06/07/2020   Procedure: VIDEO BRONCHOSCOPY WITHOUT FLUORO;  Surgeon: Brand Males, MD;  Location: [facility];  Service: Endoscopy;  Laterality: N/A;   Social History   Tobacco Use   Smoking status: Never   Smokeless tobacco: Never  Vaping Use   Vaping Use: Never used  Substance Use Topics   Alcohol use: No    Alcohol/week: 0.0 standard drinks   Drug use: No   Family History  Problem Relation Age of Onset   Breast cancer Sister 74   Thyroid disease  Sister    Cancer Sister 93       Leukemia--CLL   Heart disease Brother    Allergies  Allergen Reactions   Adalimumab     REACTION: frequent sinus infection   Diclofenac     unk   Dicyclomine     Turn teeth gray  Doxycycline Hyclate Other (See Comments)    "turns teeth gray"   Enbrel [Etanercept]     SINUS INFECTIONS   Fosamax [Alendronate Sodium]     Acid reflux   Methotrexate Derivatives Nausea Only    Oral metotrexate   Nickel    Piroxicam     unk   Salagen [Pilocarpine Hcl] Other (See Comments)    Excessive sweating, headaches   Sulfamethoxazole-Trimethoprim     REACTION: face and hands rash and felt burning sensation   Tramadol Hcl     REACTION: nausea   Plaquenil [Hydroxychloroquine] Rash   Current Outpatient Medications on File Prior to Visit  Medication Sig Dispense Refill   acetaminophen (TYLENOL) 500 MG tablet Take 500-1,000 mg by mouth every 8 (eight) hours as needed for moderate pain.     albuterol (PROAIR HFA) 108 (90 Base) MCG/ACT inhaler Inhale 2 puffs into the lungs every 4 (four) hours as needed for wheezing or shortness of breath. 18 g 3   Ascorbic Acid (VITAMIN C PO) Take 500 mg by mouth daily.     aspirin 81 MG tablet Take 81 mg by mouth daily.     budesonide (PULMICORT) 180 MCG/ACT inhaler Inhale 1 puff into the lungs daily as needed (asthma).     Calcium Carbonate-Vitamin D (CALCIUM 500/D PO) Take 1 tablet by mouth 2 (two) times daily.     Cholecalciferol (VITAMIN D3) 2000 UNITS TABS Take 2,000 Units by mouth daily.     cycloSPORINE (RESTASIS) 0.05 % ophthalmic emulsion Place 1 drop into both eyes 2 (two) times daily.     desonide (DESOWEN) 0.05 % lotion Apply 1 application topically daily as needed (irritation).     diclofenac Sodium (VOLTAREN) 1 % GEL Apply 2-4 grams to affected joint 4 times daily as needed. (Patient taking differently: Apply 2-4 g topically 4 (four) times daily as needed (pain). Apply 2-4 grams to affected joint 4 times daily as  needed.) 400 g 2   Estradiol (VAGIFEM) 10 MCG TABS vaginal tablet Place 1 tablet (10 mcg total) vaginally 2 (two) times a week. Place one tablet (10 mcg) per vagina at bedtime twice a week. 24 tablet 3   fluticasone (FLONASE) 50 MCG/ACT nasal spray USE 2 SPRAYS IN EACH NOSTRIL DAILY AS NEEDED FOR RHINITIS OR ALLERGIES (Patient taking differently: Place 2 sprays into both nostrils daily as needed for allergies. USE 2 SPRAYS IN EACH NOSTRIL DAILY AS NEEDED FOR RHINITIS OR ALLERGIES) 48 g 3   folic acid (FOLVITE) 1 MG tablet TAKE 2 TABLETS BY MOUTH DAILY (Patient taking differently: Take 2 mg by mouth daily.) 180 tablet 3   ibuprofen (ADVIL) 200 MG tablet Take 200-400 mg by mouth every 8 (eight) hours as needed (pain).     Magnesium Malate 1250 (141.7 Mg) MG TABS Take 3 tablets by mouth daily.      methotrexate 50 MG/2ML injection Inject 0.6 mLs (15 mg total) into the skin once a week. 8 mL 0   metroNIDAZOLE (METROCREAM) 0.75 % cream Apply 1 application topically 2 (two) times daily as needed (rosacea).     Multiple Vitamin (MULTI-VITAMINS) TABS Take 1 tablet by mouth daily.     Omega-3 1000 MG CAPS Take 1,000 mg by mouth daily.     ORENCIA 125 MG/ML SOSY INJECT THE CONTENTS OF 1 SYRINGE UNDER THE SKIN EVERY WEEK (Patient taking differently: Inject 125 mg into the skin every Wednesday. INJECT THE CONTENTS OF 1 SYRINGE UNDER THE SKIN EVERY WEEK) 4 mL 2  Propylene Glycol (SYSTANE BALANCE) 0.6 % SOLN Place 1 drop into both eyes daily as needed (dry eyes).     Tuberculin-Allergy Syringes 27G X 1/2" 1 ML KIT Inject 1 Syringe into the skin once a week. To be used weekly with methotrexate.     Turmeric 500 MG CAPS Take 500 mg by mouth daily.     Zinc 50 MG TABS Take 50 mg by mouth daily.     zoledronic acid (RECLAST) 5 MG/100ML SOLN injection Inject 5 mg into the vein once. Takes yearly     zolpidem (AMBIEN) 10 MG tablet Take 0.5 tablets (5 mg total) by mouth at bedtime as needed for sleep. (Patient taking  differently: Take 2.5-5 mg by mouth at bedtime.) 30 tablet 2   No current facility-administered medications on file prior to visit.     Review of Systems  Constitutional:  Positive for fatigue. Negative for activity change, appetite change, fever and unexpected weight change.  HENT:  Positive for sore throat. Negative for congestion, ear pain, rhinorrhea and sinus pressure.   Eyes:  Negative for pain, redness and visual disturbance.  Respiratory:  Positive for cough. Negative for shortness of breath and wheezing.   Cardiovascular:  Negative for chest pain, palpitations and leg swelling.  Gastrointestinal:  Negative for abdominal pain, blood in stool, constipation and diarrhea.  Endocrine: Negative for polydipsia and polyuria.  Genitourinary:  Negative for dysuria, frequency and urgency.  Musculoskeletal:  Positive for arthralgias. Negative for back pain and myalgias.  Skin:  Negative for pallor and rash.  Allergic/Immunologic: Negative for environmental allergies.  Neurological:  Negative for dizziness, syncope and headaches.  Hematological:  Negative for adenopathy. Does not bruise/bleed easily.  Psychiatric/Behavioral:  Negative for decreased concentration and dysphoric mood. The patient is not nervous/anxious.       Objective:   Physical Exam Constitutional:      General: She is not in acute distress.    Appearance: Normal appearance. She is well-developed and normal weight. She is not ill-appearing or diaphoretic.  HENT:     Head: Normocephalic and atraumatic.     Right Ear: Tympanic membrane, ear canal and external ear normal.     Left Ear: Tympanic membrane, ear canal and external ear normal.     Nose: Nose normal. No congestion.     Mouth/Throat:     Mouth: Mucous membranes are moist.     Pharynx: Oropharynx is clear. No posterior oropharyngeal erythema.     Comments: Several aphthous ulcers in throat  Eyes:     General: No scleral icterus.    Extraocular Movements:  Extraocular movements intact.     Conjunctiva/sclera: Conjunctivae normal.     Pupils: Pupils are equal, round, and reactive to light.  Neck:     Thyroid: No thyromegaly.     Vascular: No carotid bruit or JVD.  Cardiovascular:     Rate and Rhythm: Normal rate and regular rhythm.     Pulses: Normal pulses.     Heart sounds: Normal heart sounds.    No gallop.  Pulmonary:     Effort: Pulmonary effort is normal. No respiratory distress.     Breath sounds: Normal breath sounds. No wheezing.     Comments: Good air exch  No crackles or rales Chest:     Chest wall: No tenderness.  Abdominal:     General: Bowel sounds are normal. There is no distension or abdominal bruit.     Palpations: Abdomen is soft. There is no  mass.     Tenderness: There is no abdominal tenderness.     Hernia: No hernia is present.  Genitourinary:    Comments: Breast exam: No mass, nodules, thickening, tenderness, bulging, retraction, inflamation, nipple discharge or skin changes noted.  No axillary or clavicular LA.     Musculoskeletal:        General: No tenderness. Normal range of motion.     Cervical back: Normal range of motion and neck supple. No rigidity. No muscular tenderness.     Right lower leg: No edema.     Left lower leg: No edema.     Comments: No kyphosis   Lymphadenopathy:     Cervical: No cervical adenopathy.  Skin:    General: Skin is warm and dry.     Coloration: Skin is not pale.     Findings: No erythema or rash.     Comments: Solar lentigines diffusely   Neurological:     Mental Status: She is alert. Mental status is at baseline.     Cranial Nerves: No cranial nerve deficit.     Motor: No abnormal muscle tone.     Coordination: Coordination normal.     Gait: Gait normal.     Deep Tendon Reflexes: Reflexes are normal and symmetric. Reflexes normal.  Psychiatric:        Mood and Affect: Mood normal.        Cognition and Memory: Cognition and memory normal.          Assessment  & Plan:   Problem List Items Addressed This Visit       Respiratory   ILD (interstitial lung disease) ([place])    Continues rheumatology and pulm f/u  May be RA related         Digestive   GERD    Taking high dose pepcid  40 mg bid  occ breakthrough symptoms if she eats the wrong thing Disc diet control  Consider change to PPI if not well controlled with appropriate diet         Relevant Medications   famotidine (PEPCID) 40 MG tablet     Musculoskeletal and Integument   Osteoporosis - Primary    dexa 2/21 On reclast-managed by rheumatology  Taking ca and D with tx D level  On falls or fx Good exercise        Rheumatoid arthritis ([place])    Under care of rheumatology  Both lung and joint inv  Taking orencia and mtx and folic acid She notes flares after stress or fatigue Often causing ST Today some ulcers noted in throat/soft palate area  Recommend salt water gargle and f/u if no imp         Other   Hyperlipidemia    Disc goals for lipids and reasons to control them Rev last labs with pt Rev low sat fat diet in detail  LDL is improved at 129 Trig 151 HDL good  Will continue to work on diet and monitor

## 2020-07-03 NOTE — Telephone Encounter (Signed)
Patient notified of Reclast BIV findings. She would like Reclast labs completed at [facility] on 07/13/20. She has been advised that we will get repeat DEXA scan next year after she receives 5th infusion to determine next steps in therapy. She verbalized understanding.  Knox Saliva, PharmD, MPH Clinical Pharmacist (Rheumatology and Pulmonology)

## 2020-07-04 NOTE — Progress Notes (Signed)
Office Visit Note  Patient: Deanna Norris             Date of Birth: May 21, 1945           MRN: [id number]             PCP: Abner Greenspan, MD Referring: Tower, [name], MD Visit Date: 07/13/2020 Occupation: @GUAROCC @  Subjective:  Pain in multiple joints and shortness of breath.   History of Present Illness: Deanna Norris is a 75 y.o. female with a history of rheumatoid arthritis, osteoarthritis.  She was recently evaluated by Dr. [name] and was diagnosed with interstitial lung disease.  Although he believes that her symptoms are mild and stable.  He recommended that we can switch her to Imuran.  She continues to have some discomfort in her hands, knee joints and her feet.  She denies any joint swelling.  She has been tolerating medications well.  She is also noticed dry skin.  Activities of Daily Living:  Patient reports morning stiffness for 5-10 minutes.   Patient Denies nocturnal pain.  Difficulty dressing/grooming: Denies Difficulty climbing stairs: Reports Difficulty getting out of chair: Reports Difficulty using hands for taps, buttons, cutlery, and/or writing: Denies  Review of Systems  Constitutional:  Positive for fatigue.  HENT:  Positive for sore throat, mouth dryness and nose dryness. Negative for mouth sores.   Eyes:  Positive for pain, itching and dryness.  Respiratory:  Positive for shortness of breath and difficulty breathing.   Cardiovascular:  Negative for chest pain and palpitations.  Gastrointestinal:  Positive for blood in stool and constipation. Negative for diarrhea.  Endocrine: Negative for increased urination.  Genitourinary:  Negative for difficulty urinating.  Musculoskeletal:  Positive for joint pain, joint pain and morning stiffness. Negative for joint swelling, myalgias, muscle tenderness and myalgias.  Skin:  Positive for rash. Negative for color change.  Allergic/Immunologic: Negative for susceptible to infections.  Neurological:   Negative for dizziness, numbness, headaches and memory loss.  Hematological:  Negative for bruising/bleeding tendency.  Psychiatric/Behavioral:  Negative for confusion.    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   Primary osteoarthritis of both hands 01/11/2016   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/11/2016   High risk medication use 11/15/2015   Sjogren's syndrome ([place]) 10/31/2015   Chronic constipation 10/23/2015   Rosacea 10/23/2015   Rheumatoid arthritis ([place]) 03/29/2015   Coronary artery calcification seen on CT scan 11/17/2013   Recurrent sinusitis 09/16/2013   Encounter for Medicare annual wellness exam 01/31/2013   Dyspnea on exertion 09/14/2012   Nodule of right lung 09/14/2012   Hyperlipidemia 05/08/2009   Tear film insufficiency 05/08/2009   Asthma 05/08/2009   GERD 05/08/2009   MENOPAUSAL DISORDER 05/08/2009   ARTHRITIS, RHEUMATOID 05/08/2009   Osteoporosis 05/08/2009   SPONDYLOLISTHESIS 05/08/2009   Sleep apnea 05/08/2009    Past Medical History:  Diagnosis Date   Asthma    Constipation    Fibroid    GERD (gastroesophageal reflux disease)    Insomnia    Lumbar vertebral fracture (HCC)    L5-S1   Menopausal symptoms    Osteoporosis    Postmenopausal HRT (hormone  replacement therapy)    RA (rheumatoid arthritis) ([place]) 11/2004   methotrexate and Embrel   Rosacea    Sjogren's disease ([place])    Sleep apnea 2008   C-Pap   Vitamin D deficiency     Family History  Problem Relation Age of Onset   Breast cancer Sister 71   Thyroid disease Sister    Cancer Sister 86       Leukemia--CLL    Heart disease Brother    Past Surgical History:  Procedure Laterality Date   ABDOMINAL HYSTERECTOMY  01/1993   partial with AP repair, secondary to fibroids   BREAST SURGERY  1991   breast biopsy   BRONCHIAL WASHINGS  06/07/2020   Procedure: BRONCHIAL WASHINGS;  Surgeon: Brand Males, MD;  Location: [facility];  Service: Endoscopy;;   CYSTOCELE REPAIR  10/2007   with graft   MELANOMA EXCISION Right 01/2012   excision right posterior arm   NASAL SINUS SURGERY  07/1991   VAGINAL DELIVERY     x3   VIDEO BRONCHOSCOPY N/A 06/07/2020   Procedure: VIDEO BRONCHOSCOPY WITHOUT FLUORO;  Surgeon: Brand Males, MD;  Location: [facility];  Service: Endoscopy;  Laterality: N/A;   Social History   Social History Narrative   Not on file   Immunization History  Administered Date(s) Administered   DTaP 04/06/2013   Influenza Split 10/22/2010   Influenza Whole 10/10/2009, 10/20/2011   Influenza, High Dose Seasonal PF 10/14/2016, 10/08/2018, 10/07/2019   Influenza, Seasonal, Injecte, Preservative Fre 10/06/2012   Influenza,inj,Quad PF,6+ Mos 10/30/2014   Influenza,inj,quad, With Preservative 10/12/2017   Influenza-Unspecified 10/26/2013, 10/22/2015   PFIZER(Purple Top)SARS-COV-2 Vaccination 06/02/2019, 08/26/2019   Pneumococcal Conjugate-13 02/13/2014   Pneumococcal Polysaccharide-23 06/12/2010, 03/28/2015   Td 02/07/2003, 03/23/2013   Tdap 03/23/2013   Zoster, Live 05/08/2014     Objective: Vital Signs: BP 120/76 (BP Location: Left Arm, Patient Position: Sitting, Cuff Size: Normal)   Pulse 60   Resp 15   Ht 5\' 6"  (1.676 m)   Wt 176 lb 12.8 oz (80.2 kg)   LMP 01/06/1993   BMI 28.54 kg/m    Physical Exam Vitals and nursing note reviewed.  Constitutional:      Appearance: She is well-developed.  HENT:     Head: Normocephalic and atraumatic.  Eyes:     Conjunctiva/sclera: Conjunctivae normal.  Cardiovascular:     Rate and Rhythm: Normal rate and regular rhythm.     Heart  sounds: Normal heart sounds.  Pulmonary:     Effort: Pulmonary effort is normal.     Breath sounds: Normal breath sounds.  Abdominal:     General: Bowel sounds are normal.     Palpations: Abdomen is soft.  Musculoskeletal:     Cervical back: Normal range of motion.  Lymphadenopathy:     Cervical: No cervical adenopathy.  Skin:    General: Skin is warm and dry.     Capillary Refill: Capillary refill takes less than 2 seconds.  Neurological:     Mental Status: She is alert and oriented to person, place, and time.  Psychiatric:        Behavior: Behavior normal.     Musculoskeletal Exam: C-spine was in good range of motion.  Shoulder joints, elbow joints, wrist joints with good range of motion.  She had bilateral synovial thickening but no synovitis.  She had PIP and DIP thickening.  Hip joints and knee joints with good range of motion.  She had discomfort range of motion of her  knee joints.  She had some midfoot discomfort in her bilateral feet but no synovitis was noted.  CDAI Exam: CDAI Score: 4.8  Patient Global: 4 mm; Provider Global: 4 mm Swollen: 0 ; Tender: 6  Joint Exam 07/13/2020      Right  Left  MCP 2   Tender   Tender  Knee   Tender   Tender  Tarsometatarsal   Tender   Tender     Investigation: No additional findings.  Imaging: No results found.  Recent Labs: Lab Results  Component Value Date   WBC 7.4 06/22/2020   HGB 15.9 (H) 06/22/2020   PLT 254 06/22/2020   NA 140 06/22/2020   K 4.4 06/22/2020   CL 107 06/22/2020   CO2 25 06/22/2020   GLUCOSE 117 (H) 06/22/2020   BUN 12 06/22/2020   CREATININE 0.67 06/22/2020   BILITOT 0.4 06/22/2020   ALKPHOS 72 06/18/2020   AST 26 06/22/2020   ALT 28 06/22/2020   PROT 6.1 06/22/2020   ALBUMIN 4.0 06/18/2020   CALCIUM 9.2 06/22/2020   GFRAA 100 06/22/2020   QFTBGOLDPLUS NEGATIVE 03/16/2020    Speciality Comments: Prior therapy: Plaquenil (allergy), Humira and Enbrel (inadequate response)  Reclast:  06/19/16, 06/29/17, 07/08/2018, 07/20/19 DEXA 02/07/19  Procedures:  No procedures performed Allergies: Adalimumab, Diclofenac, Dicyclomine, Doxycycline hyclate, Enbrel [etanercept], Fosamax [alendronate sodium], Methotrexate derivatives, Nickel, Piroxicam, Salagen [pilocarpine hcl], Sulfamethoxazole-trimethoprim, Tramadol hcl, and Plaquenil [hydroxychloroquine]   Assessment / Plan:     Visit Diagnoses: Rheumatoid arthritis involving multiple sites with positive rheumatoid factor ([place]) - +RF, +CCP, ANA-,severe erosive: She has severe rheumatoid arthritis which is very well controlled on the combination of methotrexate and Orencia.  She had no synovitis on my examination today.  She has synovial thickening and discomfort from underlying osteoarthritis as well.  She was recently diagnosed with ILD by Dr. [name].  He suggested switching her to Imuran.  We had detailed discussion regarding Imuran.  Indications side effects contraindications were discussed at length.  Handout was given and consent was taken.  We will discontinue methotrexate and start her on Imuran at 50 mg p.o. daily for 2 weeks and then increase to 200 mg p.o. daily.  She will get labs 2 weeks x 2 and then every 2 months to monitor for drug toxicity.  Imuran prescription will be sent after TPMT results are available.  Baseline Immunosuppressant Therapy Labs TB GOLD Quantiferon TB Gold Latest Ref Rng & Units 03/16/2020  Quantiferon TB Gold Plus NEGATIVE NEGATIVE   Hepatitis Panel Hepatitis Latest Ref Rng & Units 10/31/2015  Hep C Ab NEGATIVE NEGATIVE   HIV No results found for: HIV Immunoglobulins Immunoglobulin Electrophoresis Latest Ref Rng & Units 10/13/2017  IgG 700 - 1,600 mg/dL 908  IgM 26 - 217 mg/dL 86   SPEP Serum Protein Electrophoresis Latest Ref Rng & Units 06/22/2020  Total Protein 6.1 - 8.1 g/dL 6.1  Albumin 3.8 - 4.8 g/dL -  Alpha-1 0.2 - 0.3 g/dL -  Alpha-2 0.5 - 0.9 g/dL -  Beta Globulin 0.4 - 0.6 g/dL -  Beta  2 0.2 - 0.5 g/dL -  Gamma Globulin 0.8 - 1.7 g/dL -  Interpretation - -   G6PD No results found for: G6PDH TPMT No results found for: TPMT   Chest Xray: CT chest March 06, 2020  Patient was counseled on the purpose, proper use, and adverse effects of azathioprine including risk of infection, nausea, rash, and hair loss.  Also informed that medication can  cause discoloration of urine, sweat and tears. Reviewed risk of cancer after long term use.  Discussed risk of myelosupression and reviewed importance of frequent lab work to monitor blood counts.  Standing orders placed. Reviewed drug-drug interactions including contraindication with allopurinol.  Provided patient with educational materials on azathioprine and answered all questions.  Patient consented to azathioprine.  Will upload consent into the media tab.    High risk medication use - Orencia 125 mg sq injections every 7 days, methotrexate 0.6 ml sq every 7 days, and folic acid 1 mg 2 tablets daily. -Labs from June 22, 2020 were normal.  She has been advised to get labs monitored on a regular basis.  TB gold was negative on March 16, 2020.  Plan: ORENCIA 125 MG/ML SOSY refill was given today.  Sicca syndrome (HCC)-over-the-counter products were discussed.  ILD (interstitial lung disease) (HCC)-she was evaluated by Dr. [name] and diagnosed with mild ILD.  Primary osteoarthritis of both hands-she has severe osteoarthritis in her hands which causes discomfort.  Primary osteoarthritis of both knees - X-rays of both knees were updated on 03/28/2019 which were consistent with moderate to severe osteoarthritis and severe chondromalacia patella.  She continues to have some discomfort in her knee joints.  No synovitis was noted.  Primary osteoarthritis of both feet-she has discomfort in her midfoot over the dorsal spurs.  Proper fitting shoes were discussed.  DDD (degenerative disc disease), lumbar-she has chronic lower back  pain.  Age-related osteoporosis without current pathological fracture - DEXA 02/07/2019 Lowest T-score and site measured: Left Femoral Neck -2.9. Her most recent Reclast infusion was on 07/20/2019.  This was her fourth Reclast infusion.  We will give her 1 more Reclast infusion this year and then she will be on a drug holiday for the next 2 years.  DEXA will be repeated in 2 years.  History of rosacea  Abnormal SPEP  History of hyperlipidemia  History of sleep apnea - She uses a CPAP nightly.  - Plan: zolpidem (AMBIEN) 10 MG tablet  History of gastroesophageal reflux (GERD)  Orders: Orders Placed This Encounter  Procedures   Thiopurine methyltransferase(tpmt)rbc    Meds ordered this encounter  Medications   zolpidem (AMBIEN) 10 MG tablet    Sig: Take 0.5 tablets (5 mg total) by mouth at bedtime as needed for sleep.    Dispense:  30 tablet    Refill:  2   ORENCIA 125 MG/ML SOSY    Sig: INJECT THE CONTENTS OF 1 SYRINGE UNDER THE SKIN EVERY WEEK    Dispense:  4 mL    Refill:  2     Follow-Up Instructions: Return in about 3 months (around 10/13/2020) for Rheumatoid arthritis, ILD, OA.   Bo Merino, MD  Note - This record has been created using Editor, commissioning.  Chart creation errors have been sought, but may not always  have been located. Such creation errors do not reflect on  the standard of medical care.

## 2020-07-05 ENCOUNTER — Telehealth: Payer: Self-pay | Admitting: Internal Medicine

## 2020-07-05 ENCOUNTER — Other Ambulatory Visit: Payer: Self-pay | Admitting: Internal Medicine

## 2020-07-05 ENCOUNTER — Telehealth (HOSPITAL_COMMUNITY): Payer: Self-pay | Admitting: *Deleted

## 2020-07-05 MED ORDER — FLUCONAZOLE 100 MG PO TABS: 100.0000 mg | ORAL_TABLET | Freq: Every day | ORAL | 0 refills | Status: DC

## 2020-07-05 MED ORDER — FLUCONAZOLE 100 MG PO TABS
100.0000 mg | ORAL_TABLET | Freq: Every day | ORAL | 0 refills | Status: DC
Start: 2020-07-05 — End: 2020-07-05

## 2020-07-05 NOTE — Telephone Encounter (Signed)
Attempted to call pt but unable to reach. Left message for her to return call. 

## 2020-07-05 NOTE — Telephone Encounter (Signed)
Called to remind of appointment in pulmonary rehab 05/06/2020 @ 1:30 pm for orientation/walk test.  Patient confirmed appointment, given physical directions.

## 2020-07-05 NOTE — Telephone Encounter (Signed)
I called patient back and spoke regarding Dr. Erskine Emery. Patient verbalized understanding and I sent in Diflucan to preferred pharmacy. Nothing further needed.

## 2020-07-05 NOTE — Telephone Encounter (Signed)
Patient is returning phone call. Patient phone number is [phone]. Will be available until 4:00 pm.

## 2020-07-05 NOTE — Addendum Note (Signed)
Addended by: Coralie Keens on: 07/05/2020 03:55 PM   Modules accepted: Orders

## 2020-07-05 NOTE — Telephone Encounter (Signed)
   Bal growing candida. Can be contaminant but she is immmune suppressed. Therefore plan   fluconazole 200 mg loading dose, followed by 100 mg daily for 11 days    Fungal result 1 Candida albicans Abnormal    Comment: (NOTE)  1-2 colonies                                            .  Performed At: [facility]  [address] [place], [place] [id number]

## 2020-07-06 ENCOUNTER — Encounter (HOSPITAL_COMMUNITY): Payer: Self-pay

## 2020-07-06 ENCOUNTER — Encounter (HOSPITAL_COMMUNITY)
Admission: RE | Admit: 2020-07-06 | Discharge: 2020-07-06 | Disposition: A | Discharge status: Home / Self Care | Payer: Medicare Other | Source: Ambulatory Visit | Attending: Internal Medicine | Admitting: Internal Medicine

## 2020-07-06 ENCOUNTER — Other Ambulatory Visit: Payer: Self-pay

## 2020-07-06 VITALS — BP 124/88 | HR 59 | Ht 66.75 in | Wt 179.2 lb

## 2020-07-06 DIAGNOSIS — J849 Interstitial pulmonary disease, unspecified: Secondary | ICD-10-CM

## 2020-07-06 NOTE — Progress Notes (Signed)
Deanna Norris 75 y.o. female Pulmonary Rehab Orientation Note This patient who was referred to Pulmonary rehab by Dr. [name] with the diagnosis of Interstitial Lung Disease arrived today in Cardiac and Pulmonary Rehab. She arrived ambulatory with steady gait. She does not carry portable oxygen. Per pt, she uses oxygen never. However, she does use a CPAP at night for OSA. Color good, skin warm and dry. Patient is oriented to time and place. Patient's medical history, psychosocial health, and medications reviewed. Psychosocial assessment reveals pt lives with their spouse. Pt is currently unemployed. Pt reports her stress level is low. Areas of stress/anxiety include Health.  Pt does not exhibit signs of depression. PHQ2/9 score 0/2. Pt shows good  coping skills with positive outlook. Deanna was offered emotional support and reassurance. Will continue to monitor and evaluate progress toward psychosocial goal(s) of continued mental well being and improved quality of life throughout rehab program.  Assessment by Ramon Dredge RN reveals patient is alert and oriented. Heart rate regular, breath sounds clear to auscultation, no wheezes, rales, or rhonchi. Grip strength equal. Bilateral pedal pulses present with no edema. Color good, skin warm and dry to the touch. Patient reports she does take medications as prescribed. Patient states she follows a Regular diet. The patient reports no specific efforts to gain or lose weight.  Patient's weight will be monitored closely. Demonstration and practice of PLB using pulse oximeter. Patient able to return demonstration satisfactorily. Safety and hand hygiene in the exercise area reviewed with patient. Patient voices understanding of the information reviewed. Department expectations discussed with patient and achievable goals were set. The patient shows enthusiasm about attending the program and we look forward to working with this nice lady. The patient  completed a 6 min walk test today and is scheduled to begin exercise on 07/10/20.  45 minutes was spent on a variety of activities such as assessment of the patient, obtaining baseline data including height, weight, BMI, and grip strength, verifying medical history, allergies, and current medications, and teaching patient strategies for performing tasks with less respiratory effort with emphasis on pursed lip breathing.  Rick Duff MS, ACSM CEP 3:48 PM 07/06/2020

## 2020-07-06 NOTE — Progress Notes (Signed)
Pulmonary Individual Treatment Plan  Patient Details  Name: Deanna Norris MRN: [id number] Date of Birth: May 08, 1945 Referring Provider:   [name] Pulmonary Rehab Walk Test from 07/06/2020 in [facility]  Referring Provider Dr. [name]       Initial Encounter Date:  Flowsheet Row Pulmonary Rehab Walk Test from 07/06/2020 in [facility]  Date 07/06/20       Visit Diagnosis: ILD (interstitial lung disease) ([place])  Patient's Home Medications on Admission:   Current Outpatient Medications:    acetaminophen (TYLENOL) 500 MG tablet, Take 500-1,000 mg by mouth every 8 (eight) hours as needed for moderate pain., Disp: , Rfl:    albuterol (PROAIR HFA) 108 (90 Base) MCG/ACT inhaler, Inhale 2 puffs into the lungs every 4 (four) hours as needed for wheezing or shortness of breath., Disp: 18 g, Rfl: 3   Ascorbic Acid (VITAMIN C PO), Take 500 mg by mouth daily., Disp: , Rfl:    aspirin 81 MG tablet, Take 81 mg by mouth daily., Disp: , Rfl:    budesonide (PULMICORT) 180 MCG/ACT inhaler, Inhale 1 puff into the lungs daily as needed (asthma)., Disp: , Rfl:    Calcium Carbonate-Vitamin D (CALCIUM 500/D PO), Take 1 tablet by mouth 2 (two) times daily., Disp: , Rfl:    Cholecalciferol (VITAMIN D3) 2000 UNITS TABS, Take 2,000 Units by mouth daily., Disp: , Rfl:    cycloSPORINE (RESTASIS) 0.05 % ophthalmic emulsion, Place 1 drop into both eyes 2 (two) times daily., Disp: , Rfl:    desonide (DESOWEN) 0.05 % lotion, Apply 1 application topically daily as needed (irritation)., Disp: , Rfl:    diclofenac Sodium (VOLTAREN) 1 % GEL, Apply 2-4 grams to affected joint 4 times daily as needed. (Patient taking differently: Apply 2-4 g topically 4 (four) times daily as needed (pain). Apply 2-4 grams to affected joint 4 times daily as needed.), Disp: 400 g, Rfl: 2   Estradiol (VAGIFEM) 10 MCG TABS vaginal tablet, Place 1 tablet (10 mcg total)  vaginally 2 (two) times a week. Place one tablet (10 mcg) per vagina at bedtime twice a week., Disp: 24 tablet, Rfl: 3   famotidine (PEPCID) 40 MG tablet, Take 1 tablet (40 mg total) by mouth 2 (two) times daily., Disp: 180 tablet, Rfl: 3   fluconazole (DIFLUCAN) 100 MG tablet, Take 1 tablet (100 mg total) by mouth daily. Take 223m starting dose then 101mdaily for 11 days., Disp: 13 tablet, Rfl: 0   fluconazole (DIFLUCAN) 100 MG tablet, Take 1 tablet (100 mg total) by mouth daily. Take 20065mtarting dose then 100m23mily for 11 days., Disp: 13 tablet, Rfl: 0   fluticasone (FLONASE) 50 MCG/ACT nasal spray, USE 2 SPRAYS IN EACH NOSTRIL DAILY AS NEEDED FOR RHINITIS OR ALLERGIES (Patient taking differently: Place 2 sprays into both nostrils daily as needed for allergies. USE 2 SPRAYS IN EACH NOSTRIL DAILY AS NEEDED FOR RHINITIS OR ALLERGIES), Disp: 48 g, Rfl: 3   folic acid (FOLVITE) 1 MG tablet, TAKE 2 TABLETS BY MOUTH DAILY (Patient taking differently: Take 2 mg by mouth daily.), Disp: 180 tablet, Rfl: 3   ibuprofen (ADVIL) 200 MG tablet, Take 200-400 mg by mouth every 8 (eight) hours as needed (pain)., Disp: , Rfl:    Magnesium Malate 1250 (141.7 Mg) MG TABS, Take 3 tablets by mouth daily. , Disp: , Rfl:    methotrexate 50 MG/2ML injection, Inject 0.6 mLs (15 mg total) into the skin  once a week., Disp: 8 mL, Rfl: 0   metroNIDAZOLE (METROCREAM) 0.75 % cream, Apply 1 application topically 2 (two) times daily as needed (rosacea)., Disp: , Rfl:    Multiple Vitamin (MULTI-VITAMINS) TABS, Take 1 tablet by mouth daily., Disp: , Rfl:    Omega-3 1000 MG CAPS, Take 1,000 mg by mouth daily., Disp: , Rfl:    ORENCIA 125 MG/ML SOSY, INJECT THE CONTENTS OF 1 SYRINGE UNDER THE SKIN EVERY WEEK (Patient taking differently: Inject 125 mg into the skin every Wednesday. INJECT THE CONTENTS OF 1 SYRINGE UNDER THE SKIN EVERY WEEK), Disp: 4 mL, Rfl: 2   Propylene Glycol (SYSTANE BALANCE) 0.6 % SOLN, Place 1 drop into both  eyes daily as needed (dry eyes)., Disp: , Rfl:    Tuberculin-Allergy Syringes 27G X 1/2" 1 ML KIT, Inject 1 Syringe into the skin once a week. To be used weekly with methotrexate., Disp: , Rfl:    Turmeric 500 MG CAPS, Take 500 mg by mouth daily., Disp: , Rfl:    Zinc 50 MG TABS, Take 50 mg by mouth daily., Disp: , Rfl:    zoledronic acid (RECLAST) 5 MG/100ML SOLN injection, Inject 5 mg into the vein once. Takes yearly, Disp: , Rfl:    zolpidem (AMBIEN) 10 MG tablet, Take 0.5 tablets (5 mg total) by mouth at bedtime as needed for sleep. (Patient taking differently: Take 2.5-5 mg by mouth at bedtime.), Disp: 30 tablet, Rfl: 2  Past Medical History: Past Medical History:  Diagnosis Date   Asthma    Constipation    Fibroid    GERD (gastroesophageal reflux disease)    Insomnia    Lumbar vertebral fracture (HCC)    L5-S1   Menopausal symptoms    Osteoporosis    Postmenopausal HRT (hormone replacement therapy)    RA (rheumatoid arthritis) ([place]) 11/2004   methotrexate and Embrel   Rosacea    Sjogren's disease ([place])    Sleep apnea 2008   C-Pap   Vitamin D deficiency     Tobacco Use: Social History   Tobacco Use  Smoking Status Never  Smokeless Tobacco Never    Labs: Recent Review Flowsheet Data     Labs for ITP Cardiac and Pulmonary Rehab Latest Ref Rng & Units 03/24/2016 04/01/2017 06/09/2018 06/16/2019 06/18/2020   Cholestrol 0 - 200 mg/dL 196 220(H) 228(H) 216(H) 215(H)   LDLCALC 0 - 99 mg/dL 115(H) 131(H) 134(H) 141(H) 129(H)   LDLDIRECT mg/dL - - - - -   HDL >39.00 mg/dL 64.40 67.30 68.10 59.50 55.60   Trlycerides 0.0 - 149.0 mg/dL 85.0 105.0 133.0 78.0 151.0(H)       Capillary Blood Glucose: No results found for: GLUCAP   Pulmonary Assessment Scores:  Pulmonary Assessment Scores     Row Name 07/06/20 1538         ADL UCSD   ADL Phase Entry           CAT Score     CAT Score 12           mMRC Score     mMRC Score 2            UCSD: Self-administered  rating of dyspnea associated with activities of daily living (ADLs) 6-point scale (0 = "not at all" to 5 = "maximal or unable to do because of breathlessness")  Scoring Scores range from 0 to 120.  Minimally important difference is 5 units  CAT: CAT can identify the health impairment of COPD  patients and is better correlated with disease progression.  CAT has a scoring range of zero to 40. The CAT score is classified into four groups of low (less than 10), medium (10 - 20), high (21-30) and very high (31-40) based on the impact level of disease on health status. A CAT score over 10 suggests significant symptoms.  A worsening CAT score could be explained by an exacerbation, poor medication adherence, poor inhaler technique, or progression of COPD or comorbid conditions.  CAT MCID is 2 points  mMRC: mMRC (Modified Medical Research Council) Dyspnea Scale is used to assess the degree of baseline functional disability in patients of respiratory disease due to dyspnea. No minimal important difference is established. A decrease in score of 1 point or greater is considered a positive change.   Pulmonary Function Assessment:  Pulmonary Function Assessment - 07/06/20 1408       Breath   Shortness of Breath Yes;Limiting activity             Exercise Target Goals: Exercise Program Goal: Individual exercise prescription set using results from initial 6 min walk test and THRR while considering  patient's activity barriers and safety.   Exercise Prescription Goal: Initial exercise prescription builds to 30-45 minutes a day of aerobic activity, 2-3 days per week.  Home exercise guidelines will be given to patient during program as part of exercise prescription that the participant will acknowledge.  Activity Barriers & Risk Stratification:  Activity Barriers & Cardiac Risk Stratification - 07/06/20 1405       Activity Barriers & Cardiac Risk Stratification   Activity Barriers  Arthritis;Shortness of Breath;Back Problems;Joint Problems;Deconditioning   Reumatoid Arthritis            6 Minute Walk:  6 Minute Walk     Row Name 07/06/20 1524         6 Minute Walk   Phase Initial     Distance 1447 feet     Walk Time 6 minutes     # of Rest Breaks 0     MPH 2.74     METS 2.77     RPE 11     Perceived Dyspnea  2     VO2 Peak 9.68     Symptoms No     Resting HR 59 bpm     Resting BP 124/88     Resting Oxygen Saturation  97 %     Exercise Oxygen Saturation  during 6 min walk 94 %     Max Ex. HR 83 bpm     Max Ex. BP 138/78     2 Minute Post BP 124/70           Interval HR     1 Minute HR 75     2 Minute HR 80     3 Minute HR 87     4 Minute HR 83     5 Minute HR 83     6 Minute HR 83     2 Minute Post HR 50     Interval Heart Rate? Yes           Interval Oxygen     Interval Oxygen? Yes     Baseline Oxygen Saturation % 97 %     1 Minute Oxygen Saturation % 95 %     1 Minute Liters of Oxygen 0 L     2 Minute Oxygen Saturation % 94 %     2 Minute  Liters of Oxygen 0 L     3 Minute Oxygen Saturation % 94 %     3 Minute Liters of Oxygen 0 L     4 Minute Oxygen Saturation % 95 %     4 Minute Liters of Oxygen 0 L     5 Minute Oxygen Saturation % 94 %     5 Minute Liters of Oxygen 0 L     6 Minute Oxygen Saturation % 94 %     6 Minute Liters of Oxygen 0 L     2 Minute Post Oxygen Saturation % 97 %     2 Minute Post Liters of Oxygen 0 L             Oxygen Initial Assessment:  Oxygen Initial Assessment - 07/06/20 1407       Home Oxygen   Home Oxygen Device None    Sleep Oxygen Prescription CPAP    Home Exercise Oxygen Prescription None    Home Resting Oxygen Prescription None    Compliance with Home Oxygen Use Yes      Initial 6 min Walk   Oxygen Used None      Program Oxygen Prescription   Program Oxygen Prescription None      Intervention   Short Term Goals To learn and understand importance of monitoring SPO2 with pulse  oximeter and demonstrate accurate use of the pulse oximeter.;To learn and exhibit compliance with exercise, home and travel O2 prescription;To learn and understand importance of maintaining oxygen saturations>88%;To learn and demonstrate proper pursed lip breathing techniques or other breathing techniques. ;To learn and demonstrate proper use of respiratory medications    Long  Term Goals Exhibits compliance with exercise, home  and travel O2 prescription;Verbalizes importance of monitoring SPO2 with pulse oximeter and return demonstration;Maintenance of O2 saturations>88%;Exhibits proper breathing techniques, such as pursed lip breathing or other method taught during program session;Compliance with respiratory medication;Demonstrates proper use of MDI's             Oxygen Re-Evaluation:   Oxygen Discharge (Final Oxygen Re-Evaluation):   Initial Exercise Prescription:  Initial Exercise Prescription - 07/06/20 1500       Date of Initial Exercise RX and Referring Provider   Date 07/06/20    Referring Provider Dr. [name]    Expected Discharge Date 09/06/20      NuStep   Level 2    SPM 70    Minutes 15      Track   Minutes 15      Prescription Details   Frequency (times per week) 2    Duration Progress to 30 minutes of continuous aerobic without signs/symptoms of physical distress      Intensity   THRR 40-80% of Max Heartrate 58-117    Ratings of Perceived Exertion 11-13    Perceived Dyspnea 0-4      Progression   Progression Continue to progress workloads to maintain intensity without signs/symptoms of physical distress.      Resistance Training   Training Prescription Yes    Weight Red bands    Reps 10-15             Perform Capillary Blood Glucose checks as needed.  Exercise Prescription Changes:   Exercise Comments:   Exercise Goals and Review:   Exercise Goals     Row Name 07/06/20 1523             Exercise Goals   Increase Physical  Activity Yes  Intervention Provide advice, education, support and counseling about physical activity/exercise needs.;Develop an individualized exercise prescription for aerobic and resistive training based on initial evaluation findings, risk stratification, comorbidities and participant's personal goals.       Expected Outcomes Short Term: Attend rehab on a regular basis to increase amount of physical activity.;Long Term: Add in home exercise to make exercise part of routine and to increase amount of physical activity.;Long Term: Exercising regularly at least 3-5 days a week.       Increase Strength and Stamina Yes       Intervention Provide advice, education, support and counseling about physical activity/exercise needs.;Develop an individualized exercise prescription for aerobic and resistive training based on initial evaluation findings, risk stratification, comorbidities and participant's personal goals.       Expected Outcomes Short Term: Increase workloads from initial exercise prescription for resistance, speed, and METs.;Short Term: Perform resistance training exercises routinely during rehab and add in resistance training at home;Long Term: Improve cardiorespiratory fitness, muscular endurance and strength as measured by increased METs and functional capacity (6MWT)       Able to understand and use rate of perceived exertion (RPE) scale Yes       Intervention Provide education and explanation on how to use RPE scale       Expected Outcomes Short Term: Able to use RPE daily in rehab to express subjective intensity level;Long Term:  Able to use RPE to guide intensity level when exercising independently       Able to understand and use Dyspnea scale Yes       Intervention Provide education and explanation on how to use Dyspnea scale       Expected Outcomes Short Term: Able to use Dyspnea scale daily in rehab to express subjective sense of shortness of breath during exertion;Long Term: Able to  use Dyspnea scale to guide intensity level when exercising independently       Knowledge and understanding of Target Heart Rate Range (THRR) Yes       Intervention Provide education and explanation of THRR including how the numbers were predicted and where they are located for reference       Expected Outcomes Short Term: Able to state/look up THRR;Short Term: Able to use daily as guideline for intensity in rehab;Long Term: Able to use THRR to govern intensity when exercising independently       Understanding of Exercise Prescription Yes       Intervention Provide education, explanation, and written materials on patient's individual exercise prescription       Expected Outcomes Short Term: Able to explain program exercise prescription;Long Term: Able to explain home exercise prescription to exercise independently                Exercise Goals Re-Evaluation :   Discharge Exercise Prescription (Final Exercise Prescription Changes):   Nutrition:  Target Goals: Understanding of nutrition guidelines, daily intake of sodium <1573m, cholesterol <2085m calories 30% from fat and 7% or less from saturated fats, daily to have 5 or more servings of fruits and vegetables.  Biometrics:  Pre Biometrics - 07/06/20 1524       Pre Biometrics   Grip Strength 18 kg              Nutrition Therapy Plan and Nutrition Goals:   Nutrition Assessments:  MEDIFICTS Score Key: ?70 Need to make dietary changes  40-70 Heart Healthy Diet ? 40 Therapeutic Level Cholesterol Diet   Picture Your Plate Scores: <4<84  Unhealthy dietary pattern with much room for improvement. 41-50 Dietary pattern unlikely to meet recommendations for good health and room for improvement. 51-60 More healthful dietary pattern, with some room for improvement.  >60 Healthy dietary pattern, although there may be some specific behaviors that could be improved.    Nutrition Goals Re-Evaluation:   Nutrition Goals Discharge  (Final Nutrition Goals Re-Evaluation):   Psychosocial: Target Goals: Acknowledge presence or absence of significant depression and/or stress, maximize coping skills, provide positive support system. Participant is able to verbalize types and ability to use techniques and skills needed for reducing stress and depression.  Initial Review & Psychosocial Screening:  Initial Psych Review & Screening - 07/06/20 1408       Initial Review   Current issues with None Identified      Family Dynamics   Good Support System? Yes   Husband, friends and family     Barriers   Psychosocial barriers to participate in program The patient should benefit from training in stress management and relaxation.      Screening Interventions   Interventions Encouraged to exercise    Expected Outcomes Long Term Goal: Stressors or current issues are controlled or eliminated.;Short Term goal: Identification and review with participant of any Quality of Life or Depression concerns found by scoring the questionnaire.;Long Term goal: The participant improves quality of Life and PHQ9 Scores as seen by post scores and/or verbalization of changes             Quality of Life Scores:  Scores of 19 and below usually indicate a poorer quality of life in these areas.  A difference of  2-3 points is a clinically meaningful difference.  A difference of 2-3 points in the total score of the Quality of Life Index has been associated with significant improvement in overall quality of life, self-image, physical symptoms, and general health in studies assessing change in quality of life.  PHQ-9: Recent Review Flowsheet Data     Depression screen [facility] 2/9 07/06/2020 07/02/2020 06/16/2019 04/08/2018 04/01/2017   Decreased Interest 0 0 0 0 0   Down, Depressed, Hopeless 0 0 0 0 0   PHQ - 2 Score 0 0 0 0 0   Altered sleeping 0 1 0 0 0   Tired, decreased energy 2 2 0 0 0   Change in appetite 0 1 0 0 0   Feeling bad or failure about  yourself  0 0 0 0 0   Trouble concentrating 0 0 0 0 0   Moving slowly or fidgety/restless - 0 0 0 0   Suicidal thoughts 0 0 0 0 0   PHQ-9 Score 2 4 0 0 0   Difficult doing work/chores - - Not difficult at all Not difficult at all Not difficult at all      Interpretation of Total Score  Total Score Depression Severity:  1-4 = Minimal depression, 5-9 = Mild depression, 10-14 = Moderate depression, 15-19 = Moderately severe depression, 20-27 = Severe depression   Psychosocial Evaluation and Intervention:  Psychosocial Evaluation - 07/06/20 1530       Psychosocial Evaluation & Interventions   Interventions Encouraged to exercise with the program and follow exercise prescription    Comments Patient does not show any psychosocial barriers to exercise and seems to have a positive outlook.    Expected Outcomes For pt to remain free of psychosocial barriers throughout rehab program and for pt to have continue mental well being  Continue Psychosocial Services  No Follow up required             Psychosocial Re-Evaluation:   Psychosocial Discharge (Final Psychosocial Re-Evaluation):   Education: Education Goals: Education classes will be provided on a weekly basis, covering required topics. Participant will state understanding/return demonstration of topics presented.  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 Instructor discusses the definition of a risk factor, different risk factors for pulmonary disease, and how the heart and lungs work together.     Nutrition for Pulmonary Patient:   -Group instruction provided by PowerPoint slides, verbal discussion, and written materials to support subject matter. The instructor gives an explanation and review of healthy diet recommendations, which includes a discussion on weight management, recommendations for fruit and vegetable consumption, as well as protein, fluid, caffeine, fiber, sodium, sugar, and alcohol. Tips for eating when patients are short of breath are discussed.   Pursed Lip Breathing:  -Group instruction that is supported by demonstration and informational handouts. Instructor discusses the benefits of pursed lip and diaphragmatic breathing and detailed demonstration on how to preform both.     Oxygen Safety:  -Group instruction provided by PowerPoint, verbal discussion, and written material to support subject matter. There is an overview of "What is Oxygen" and "Why do we need it".  Instructor also reviews how to create a safe environment for oxygen use, the importance of using oxygen as prescribed, and the risks of noncompliance. There is a brief discussion on traveling with oxygen and resources the patient may utilize.   Oxygen Equipment:  -Group instruction provided by [facility] Staff utilizing handouts, written materials, and equipment demonstrations.   Signs and Symptoms:  -Group instruction provided by written material and verbal discussion to support subject matter. Warning signs and symptoms of infection, stroke, and heart attack are reviewed and when to call the physician/911 reinforced. Tips for preventing the spread of infection discussed.   Advanced Directives:  -Group instruction provided by verbal instruction and written material to support subject matter. Instructor reviews Advanced Directive laws and proper instruction for filling out document.   Pulmonary Video:  -Group video education that reviews the importance of medication and oxygen compliance, exercise, good nutrition, pulmonary hygiene, and  pursed lip and diaphragmatic breathing for the pulmonary patient.   Exercise for the Pulmonary Patient:  -Group instruction that is supported by a PowerPoint presentation. Instructor discusses benefits of exercise, core components of exercise, frequency, duration, and intensity of an exercise routine, importance of utilizing pulse oximetry during exercise, safety while exercising, and options of places to exercise outside of rehab.     Pulmonary Medications:  -Verbally interactive group education provided by instructor with focus on inhaled medications and proper administration.   Anatomy and Physiology of the Respiratory System and Intimacy:  -Group instruction provided by PowerPoint, verbal discussion, and written material to support subject matter. Instructor reviews respiratory cycle and anatomical components of the respiratory system and their functions. Instructor also reviews differences in obstructive and restrictive respiratory diseases with examples of each. Intimacy, Sex, and Sexuality differences are reviewed with a discussion on how relationships can change when diagnosed with  pulmonary disease. Common sexual concerns are reviewed.   MD DAY -A group question and answer session with a medical doctor that allows participants to ask questions that relate to their pulmonary disease state.   OTHER EDUCATION -Group or individual verbal, written, or video instructions that support the educational goals of the pulmonary rehab program.   Holiday Eating Survival Tips:  -Group instruction provided by PowerPoint slides, verbal discussion, and written materials to support subject matter. The instructor gives patients tips, tricks, and techniques to help them not only survive but enjoy the holidays despite the onslaught of food that accompanies the holidays.   Knowledge Questionnaire Score:  Knowledge Questionnaire Score - 07/06/20 1537       Knowledge Questionnaire Score   Pre Score  14/18             Core Components/Risk Factors/Patient Goals at Admission:  Personal Goals and Risk Factors at Admission - 07/06/20 1534       Core Components/Risk Factors/Patient Goals on Admission   Expected Outcomes Long Term: Adherence to nutrition and physical activity/exercise program aimed toward attainment of established weight goal;Weight Loss: Understanding of general recommendations for a balanced deficit meal plan, which promotes 1-2 lb weight loss per week and includes a negative energy balance of [phone] kcal/d;Short Term: Continue to assess and modify interventions until short term weight is achieved;Understanding of distribution of calorie intake throughout the day with the consumption of 4-5 meals/snacks    Improve shortness of breath with ADL's Yes    Intervention Provide education, individualized exercise plan and daily activity instruction to help decrease symptoms of SOB with activities of daily living.    Expected Outcomes Short Term: Improve cardiorespiratory fitness to achieve a reduction of symptoms when performing ADLs;Long Term: Be able to perform more ADLs without symptoms or delay the onset of symptoms             Core Components/Risk Factors/Patient Goals Review:    Core Components/Risk Factors/Patient Goals at Discharge (Final Review):    ITP Comments:   Comments:

## 2020-07-10 ENCOUNTER — Other Ambulatory Visit (HOSPITAL_COMMUNITY)
Admission: RE | Admit: 2020-07-10 | Discharge: 2020-07-10 | Disposition: A | Discharge status: Home / Self Care | Payer: Medicare Other | Source: Ambulatory Visit | Attending: Internal Medicine | Admitting: Internal Medicine

## 2020-07-10 ENCOUNTER — Other Ambulatory Visit: Payer: Self-pay

## 2020-07-10 ENCOUNTER — Encounter (HOSPITAL_COMMUNITY)
Admission: RE | Admit: 2020-07-10 | Discharge: 2020-07-10 | Disposition: A | Discharge status: Home / Self Care | Payer: Medicare Other | Source: Ambulatory Visit | Attending: Internal Medicine | Admitting: Internal Medicine

## 2020-07-10 ENCOUNTER — Ambulatory Visit: Payer: Medicare Other

## 2020-07-10 DIAGNOSIS — Z20822 Contact with and (suspected) exposure to covid-19: Secondary | ICD-10-CM | POA: Diagnosis not present

## 2020-07-10 DIAGNOSIS — J849 Interstitial pulmonary disease, unspecified: Secondary | ICD-10-CM

## 2020-07-10 DIAGNOSIS — Z01812 Encounter for preprocedural laboratory examination: Secondary | ICD-10-CM | POA: Diagnosis not present

## 2020-07-10 NOTE — Progress Notes (Signed)
Daily Session Note  Patient Details  Name: Deanna Norris MRN: [id number] Date of Birth: 1945-08-20 Referring Provider:   [name] Pulmonary Rehab Walk Test from 07/06/2020 in [place]  Referring Provider Dr. [name]       Encounter Date: 07/10/2020  Check In:  Session Check In - 07/10/20 1419       Check-In   Supervising physician immediately available to respond to emergencies [facility] immediately available    Physician(s) Dr. Alfredia Ferguson    Location [facility]    Staff Present [name], RN, BSN;Lisa [name], RN;Aprille Sawhney Hassell Done, MS, ACSM-CEP, Exercise Physiologist;Kaylee [name], MS, ACSM-CEP, Exercise Physiologist    Virtual Visit No    Medication changes reported     No    Fall or balance concerns reported    No    Tobacco Cessation No Change    Warm-up and Cool-down Performed as group-led instruction    Resistance Training Performed Yes    VAD Patient? No    PAD/SET Patient? No      Pain Assessment   Currently in Pain? No/denies    Multiple Pain Sites No             Capillary Blood Glucose: No results found for this or any previous visit (from the past 24 hour(s)).    Social History   Tobacco Use  Smoking Status Never  Smokeless Tobacco Never    Goals Met:  Proper associated with RPD/PD & O2 Sat Exercise tolerated well No report of cardiac concerns or symptoms Strength training completed today  Goals Unmet:  Not Applicable  Comments: Service time is from 1315 to 1431 Patient completed first day of exercise and tolerated well with no complaints or concerns. Will continue to monitor throughout program.     Dr. [name] is Medical Director for Cardiac Rehab at [facility].

## 2020-07-11 ENCOUNTER — Telehealth: Payer: Self-pay | Admitting: Internal Medicine

## 2020-07-11 ENCOUNTER — Ambulatory Visit (INDEPENDENT_AMBULATORY_CARE_PROVIDER_SITE_OTHER): Payer: Medicare Other | Admitting: Internal Medicine

## 2020-07-11 VITALS — BP 102/62 | HR 68 | Ht 66.0 in | Wt 179.0 lb

## 2020-07-11 DIAGNOSIS — D849 Immunodeficiency, unspecified: Secondary | ICD-10-CM | POA: Diagnosis not present

## 2020-07-11 DIAGNOSIS — M359 Systemic involvement of connective tissue, unspecified: Secondary | ICD-10-CM

## 2020-07-11 DIAGNOSIS — J849 Interstitial pulmonary disease, unspecified: Secondary | ICD-10-CM

## 2020-07-11 DIAGNOSIS — Z8739 Personal history of other diseases of the musculoskeletal system and connective tissue: Secondary | ICD-10-CM

## 2020-07-11 DIAGNOSIS — J8489 Other specified interstitial pulmonary diseases: Secondary | ICD-10-CM | POA: Diagnosis not present

## 2020-07-11 DIAGNOSIS — R0602 Shortness of breath: Secondary | ICD-10-CM

## 2020-07-11 LAB — PULMONARY FUNCTION TEST
DL/VA % pred: 99 %
DL/VA: 4.02 ml/min/mmHg/L
DLCO cor % pred: 87 %
DLCO cor: 17.93 ml/min/mmHg
DLCO unc % pred: 93 %
DLCO unc: 19.17 ml/min/mmHg
FEF 25-75 Post: 2.24 L/sec
FEF 25-75 Pre: 1.14 L/sec
FEF2575-%Change-Post: 96 %
FEF2575-%Pred-Post: 122 %
FEF2575-%Pred-Pre: 62 %
FEV1-%Change-Post: 5 %
FEV1-%Pred-Post: 90 %
FEV1-%Pred-Pre: 85 %
FEV1-Post: 2.11 L
FEV1-Pre: 2 L
FEV1FVC-%Change-Post: 7 %
FEV1FVC-%Pred-Pre: 100 %
FEV6-%Change-Post: 0 %
FEV6-%Pred-Post: 86 %
FEV6-%Pred-Pre: 86 %
FEV6-Post: 2.55 L
FEV6-Pre: 2.54 L
FEV6FVC-%Change-Post: 3 %
FEV6FVC-%Pred-Post: 104 %
FEV6FVC-%Pred-Pre: 100 %
FVC-%Change-Post: -1 %
FVC-%Pred-Post: 83 %
FVC-%Pred-Pre: 85 %
FVC-Post: 2.59 L
FVC-Pre: 2.65 L
Post FEV1/FVC ratio: 81 %
Post FEV6/FVC ratio: 100 %
Pre FEV1/FVC ratio: 75 %
Pre FEV6/FVC Ratio: 96 %
RV % pred: 71 %
RV: 1.69 L
TLC % pred: 86 %
TLC: 4.66 L

## 2020-07-11 LAB — SARS CORONAVIRUS 2 (TAT 6-24 HRS): SARS Coronavirus 2: NEGATIVE

## 2020-07-11 NOTE — Patient Instructions (Addendum)
ICD-10-CM   1. 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 ILD (interstitial lung disease) ([place])  J84.9   3. History of rheumatoid arthritis  Z87.39   4. Immunosuppressed status ([place])  D84.9     You have onset of interstitial lung diease (ILD) in 2020 and since then is somewhat progressive based on CT. Based on breathing test appears 5% decline in 7 years . This is very minimal/mild decline  Bronchoscopy with lavage June 07, 2020: Showed growth of Candida albicans  -You are on a course of Diflucan since June 28, 2020  -However I think this is what is called a "contaminant"  -No evidence of other opportunistic infection in the lavage   Glad you  started pulmonary rehabilitation and prior history have been reassured about your cardiac status    Plan -I would support continuing current immunosuppressive regimen without change -No indication for antifibrotic given extremely mild decline -Overall I would advocate continued monitoring of lung function -and if there is evidence of accelerated progression then consider antifibrotic's -If Dr. Keturah Barre wants to consider Imuran at this stage for pain control that would be fine with [place] from a pulmonary perspective  Follow-up   -6 months do spirometry and DLCO -Return to see Dr. [name] in 6 months in a 15-minute slot  Addendum: Get echocardiogram

## 2020-07-11 NOTE — Progress Notes (Signed)
Full PFT performed today. °

## 2020-07-11 NOTE — Patient Instructions (Signed)
Full PFT performed today. °

## 2020-07-11 NOTE — Progress Notes (Signed)
Subjective:    Patient ID: Deanna Norris, female    DOB: 08-07-1945, 75 y.o.   MRN: [id number]  HPI    OV 11/01/2013  Chief Complaint  Patient presents with   Follow-up    Pt here after cards referral and CPST. Pt c/o dyspnea only with over exertion. Pt denies cough and CP   Follow-up dyspnea evaluation after cardiopulmonary stress testing. She underwent cardiopulmonary stress testing on 10/10/2013  Results show maximal effort but the VO2 max of 108% predicted and a normal anaerobic threshold. Her ventilator parameters were normal. She appropriately reached heart rate max with a normal heart rate reserve at peak exercise. However the O2 pulse which is a surrogate for stroke volume was flat during the second half of exercise. This is classically consistent with mild diastolic dysfunction especially in the context of a normal nuclear medicine cardiac stress test. There was no evidence of exercise induced bronchospasm.  She has coronary artery calcification and she underwent a nuclear medicine cardiac stress test 10/19/2013 that has been reported as normal   OV Oct 2020: Dr Vaughan Browner Chief complaint: Follow up for mild asthma  HPI: 75 year old with history of sarcoidosis, rheumatoid arthritis, sleep apnea Complains of dyspnea on exertion for the past few months.  She has mild symptoms at rest.  Denies any cough, sputum production, fevers, chills Previously evaluated by Dr. [name] in 2015 with high-resolution CT with no evidence of interstitial lung disease. She also had a cardiology evaluation at that time for coronary atherosclerosis with negative stress test  Has history of seropositive rheumatoid arthritis, Sjogren's syndrome.  Maintained on Orencia and methotrexate.  Followed with Dr. Estanislado Pandy. Has sleep apnea for which she is on CPAP for the past 15 years with no issues.  Sees Dr. Maxwell Caul for OSA.  Pets: No pets Occupation: Homemaker Exposures: No known exposures.   No mold, hot tub, Jacuzzi Smoking history: Never smoker Travel history: Generally from [place].  Has been in [place] since 1990 Relevant family history: No significant family history of lung disease   Given Symbicort at last visit.  She has not noticed any difference with this and hence stopped it a month ago. She is wary of using inhaled corticosteroids due to side effects of osteoporosis.     OV 04/04/2020  Subjective:  Patient ID: Deanna Norris, female , DOB: [birth year] , age 75 y.o. , MRN: [id number] , ADDRESS: [place] [address] PCP Tower, Deanna Fanny, MD Patient Care Team: Tower, Deanna Fanny, MD as PCP - Kennon Portela, MD as Consulting Physician (Dermatology) Juanita Craver, MD as Consulting Physician (Gastroenterology) Marica Otter, OD as Consulting Physician (Optometry) Belva Crome, MD as Consulting Physician (Cardiology)  This Provider for this visit: Treatment Team:  Attending Provider: Brand Males, MD    04/04/2020 -   Chief Complaint  Patient presents with   Follow-up    Pt states that she does have complaints of SOB which is worse with activities.     HPI Deanna Norris 75 y.o. -returns for follow-up.  I personally saw her in 2015 for shortness of breath.  High-resolution CT chest at that time was clean.  Then in 2020 she started noticing insidious onset of shortness of breath.  She followed up with Dr. Vaughan Browner.  CT chest showed possible early onset ILD.  Then again because of the pandemic she did not follow-up.  She is now following up with Dr. [name] to establish care  in the ILD center.  She saying she is having worsening shortness of breath with exertion relieved by rest.  Symptom severity is documented below.  She saying is definitely progressive.  There is no associated chest pain.  She is seeing cardiology and apparently she has been cleared.  She is immunosuppressed.  She is been on methotrexate since 2005 2006 then  approximately in 2012 she got switched to injection form of methotrexate following vertigo with the oral form.  She has been on Orencia for 8 years.  She is not on prednisone or Bactrim.  Most recently in February 2022 she had high-resolution CT chest that shows presence of ILD and that is progressive compared to 2 years earlier.  I personally visualized this film and showed it to the patient.  Therefore she is being referred here.  Last pulmonary function test was in 2015.      CT Chest data 03/05/20   Narrative & Impression  CLINICAL DATA:  75 year old female with history of shortness of breath on exertion. History of rheumatoid arthritis.   EXAM: CT CHEST WITHOUT CONTRAST   TECHNIQUE: Multidetector CT imaging of the chest was performed following the standard protocol without intravenous contrast. High resolution imaging of the lungs, as well as inspiratory and expiratory imaging, was performed.   COMPARISON:  High-resolution chest CT 08/04/2018.   FINDINGS: Cardiovascular: Heart size is normal. There is no significant pericardial fluid, thickening or pericardial calcification. There is aortic atherosclerosis, as well as atherosclerosis of the great vessels of the mediastinum and the coronary arteries, including calcified atherosclerotic plaque in the left main, left anterior descending and right coronary arteries.   Mediastinum/Nodes: No pathologically enlarged mediastinal or hilar lymph nodes. Please note that accurate exclusion of hilar adenopathy is limited on noncontrast CT scans. Esophagus is unremarkable in appearance. No axillary lymphadenopathy.   Lungs/Pleura: High-resolution images again demonstrate minimal ground-glass attenuation in the extreme lung bases. Trace amount of septal thickening noted, most evident in the inferior segment of the lingula and lateral aspect of the left lower lobe. No traction bronchiectasis or honeycombing. Inspiratory and expiratory  imaging demonstrates moderate air trapping indicative of small airways disease. No acute consolidative airspace disease. No pleural effusions. No suspicious appearing pulmonary nodules or masses are noted.   Upper Abdomen: Aortic atherosclerosis.   Musculoskeletal: There are no aggressive appearing lytic or blastic lesions noted in the visualized portions of the skeleton.   IMPRESSION: 1. Very subtle changes suggestive of interstitial lung disease redemonstrated, with minimal progression compared to the prior study. Findings are extremely mild and considered indeterminate for usual interstitial pneumonia (UIP) per current ATS guidelines. If there is persistent clinical concern for interstitial lung disease, repeat high-resolution chest CT is recommended in 12 months to assess for temporal changes in the appearance of the lung parenchyma. 2. Moderate air trapping indicative of small airways disease. 3. Aortic atherosclerosis, in addition to left main and 2 vessel coronary artery disease. Assessment for potential risk factor modification, dietary therapy or pharmacologic therapy may be warranted, if clinically indicated.   Aortic Atherosclerosis (ICD10-I70.0).     Electronically Signed   By: Vinnie Langton M.D.   On: 03/06/2020 08:59        OV 07/11/2020  Subjective:  Patient ID: Deanna Norris, female , DOB: 03-04-1945 , age 59 y.o. , MRN: [id number] , ADDRESS: [address] [place] [address] PCP Tower, Deanna Fanny, MD Patient Care Team: Abner Greenspan, MD as PCP - General Jarome Matin,  MD as Consulting Physician (Dermatology) Juanita Craver, MD as Consulting Physician (Gastroenterology) Marica Otter, OD as Consulting Physician (Optometry) Belva Crome, MD as Consulting Physician (Cardiology)  This Provider for this visit: Treatment Team:  Attending Provider: Brand Males, MD    07/11/2020 -   Chief Complaint  Patient presents with   Follow-up    Pt  has not used inhalers.    Follow-up shortness of breath in the setting of rheumatoid arthritis and Sjogren's Immunosuppressed in the setting of methotrexate and Orencia History of coronary artery calcification -negative stress test 2015 Echo 2020 -with mild to moderate aortic incompetence Obesity History of prior asthma Early/mild indeterminate for UIP ILD  -2015 no ILD -July 2020 through February 2022 with minimal progression   HPI Deanna B Soland 74 y.o. --presents for follow-up.  She is status post bronchoscopy approximately just over a month ago.  The cultures grew Candida it was 75% polys.  We put her on fluconazole she is taking that right now.  No side effects.  She is not noticing any difference in her symptoms.  Suspect it was a contaminant.  No other organisms grown.  She started pulmonary rehabilitation.  His symptom score is unchanged.  She had pulmonary function test shows both FVC and DLCO decline of 5% over 7 years which is 0.7 %/year.  Is a very minimal decline.  Her DLCO currently is normal.  Therefore the ILD is extremely minimal.  Nevertheless she is out of proportion dyspnea and she is very worried about this.  We discussed that dyspnea could be multifactorial based on weight, possible diastolic dysfunction [although echo in 2020 did not specifically reveal this].,  Moderate aortic incompetence, physical deconditioning.  We will have to see how pulmonary rehabilitation works.  She believes asthma is not active she has an albuterol inhaler with her which she does not use and has never felt the need to use it.  There is no fever or chills.     SYMPTOM SCALE - ILD 04/04/2020  07/11/2020 Nw in rehab  O2 use ra   Shortness of Breath 0 -> 5 scale with 5 being worst (score 6 If unable to do)   At rest 0 0  Simple tasks - showers, clothes change, eating, shaving 0 0  Household (dishes, doing bed, laundry) 1 1  Shopping 1 1  Walking level at own pace 1 1  Walking up Stairs 3 3   Total (30-36) Dyspnea Score 6 6  How bad is your cough? 0 0  How bad is your fatigue 2 1  How bad is nausea 1 1  How bad is vomiting?  0 0  How bad is diarrhea? 0 0  How bad is anxiety? 0 0  How bad is depression 0 0    Results for PHILIP, ECKERSLEY (MRN [id number]) as of 07/11/2020 15:46  Ref. Range 06/07/2020 09:30  Monocyte-Macrophage-Serous Fluid Latest Ref Range: 50 - 90 % 8 (L)  Other Cells, Fluid Latest Units: % LINING CELLS PRESENT  Fluid Type-FCT Unknown Bronch Lavag  Color, Fluid Unknown COLORLESS  Total Nucleated Cell Count, Fluid Latest Ref Range: 0 - 1,000 cu mm 47  Lymphs, Fluid Latest Units: % 16  Eos, Fluid Latest Units: % 2  Appearance, Fluid Latest Ref Range: CLEAR  HAZY (A)  Neutrophil Count, Fluid Latest Ref Range: 0 - 25 % 74 (H)   Fungal result 1 Candida albicans Abnormal     Simple office walk 185 feet x  3 laps goal with forehead probe 04/04/2020  07/11/2020   O2 used ra   Number laps completed 3   Comments about pace mod   Resting Pulse Ox/HR 100% and 60/min   Final Pulse Ox/HR 95% and 83/min   Desaturated </= 88% no   Desaturated <= 3% points yesm 5   Got Tachycardic >/= 90/min bo   Symptoms at end of test Mild dyspena   Miscellaneous comments x     PFT  PFT Results Latest Ref Rng & Units 07/11/2020 09/01/2013  FVC-Pre L 2.65 2.78  FVC-Predicted Pre % 85 83  FVC-Post L 2.59 2.86  FVC-Predicted Post % 83 86  Pre FEV1/FVC % % 75 73  Post FEV1/FCV % % 81 80  FEV1-Pre L 2.00 2.03  FEV1-Predicted Pre % 85 80  FEV1-Post L 2.11 2.29  DLCO uncorrected ml/min/mmHg 19.17 20.14  DLCO UNC% % 93 74  DLCO corrected ml/min/mmHg 17.93 -  DLCO COR %Predicted % 87 -  DLVA Predicted % 99 99  TLC L 4.66 5.47  TLC % Predicted % 86 102  RV % Predicted % 71 101       has a past medical history of Asthma, Constipation, Fibroid, GERD (gastroesophageal reflux disease), Insomnia, Lumbar vertebral fracture ([place]), Menopausal symptoms, Osteoporosis,  Postmenopausal HRT (hormone replacement therapy), RA (rheumatoid arthritis) ([place]) (11/2004), Rosacea, Sjogren's disease ([place]), Sleep apnea (2008), and Vitamin D deficiency.   reports that she has never smoked. She has never used smokeless tobacco.  Past Surgical History:  Procedure Laterality Date   ABDOMINAL HYSTERECTOMY  01/1993   partial with AP repair, secondary to fibroids   BREAST SURGERY  1991   breast biopsy   BRONCHIAL WASHINGS  06/07/2020   Procedure: BRONCHIAL WASHINGS;  Surgeon: Brand Males, MD;  Location: [facility];  Service: Endoscopy;;   CYSTOCELE REPAIR  10/2007   with graft   MELANOMA EXCISION Right 01/2012   excision right posterior arm   NASAL SINUS SURGERY  07/1991   VAGINAL DELIVERY     x3   VIDEO BRONCHOSCOPY N/A 06/07/2020   Procedure: VIDEO BRONCHOSCOPY WITHOUT FLUORO;  Surgeon: Brand Males, MD;  Location: [facility];  Service: Endoscopy;  Laterality: N/A;    Allergies  Allergen Reactions   Adalimumab     REACTION: frequent sinus infection   Diclofenac     unk   Dicyclomine     Turn teeth gray   Doxycycline Hyclate Other (See Comments)    "turns teeth gray"   Enbrel [Etanercept]     SINUS INFECTIONS   Fosamax [Alendronate Sodium]     Acid reflux   Methotrexate Derivatives Nausea Only    Oral metotrexate   Nickel    Piroxicam     unk   Salagen [Pilocarpine Hcl] Other (See Comments)    Excessive sweating, headaches   Sulfamethoxazole-Trimethoprim     REACTION: face and hands rash and felt burning sensation   Tramadol Hcl     REACTION: nausea   Plaquenil [Hydroxychloroquine] Rash    Immunization History  Administered Date(s) Administered   DTaP 04/06/2013   Influenza Split 10/22/2010   Influenza Whole 10/10/2009, 10/20/2011   Influenza, High Dose Seasonal PF 10/14/2016, 10/08/2018, 10/07/2019   Influenza, Seasonal, Injecte, Preservative Fre 10/06/2012   Influenza,inj,Quad PF,6+ Mos 10/30/2014   Influenza,inj,quad, With  Preservative 10/12/2017   Influenza-Unspecified 10/26/2013, 10/22/2015   PFIZER(Purple Top)SARS-COV-2 Vaccination 06/02/2019, 08/26/2019   Pneumococcal Conjugate-13 02/13/2014   Pneumococcal Polysaccharide-23 06/12/2010, 03/28/2015   Td 02/07/2003, 03/23/2013  Tdap 03/23/2013   Zoster, Live 05/08/2014    Family History  Problem Relation Age of Onset   Breast cancer Sister 60   Thyroid disease Sister    Cancer Sister 31       Leukemia--CLL   Heart disease Brother      Current Outpatient Medications:    acetaminophen (TYLENOL) 500 MG tablet, Take 500-1,000 mg by mouth every 8 (eight) hours as needed for moderate pain., Disp: , Rfl:    albuterol (PROAIR HFA) 108 (90 Base) MCG/ACT inhaler, Inhale 2 puffs into the lungs every 4 (four) hours as needed for wheezing or shortness of breath., Disp: 18 g, Rfl: 3   Ascorbic Acid (VITAMIN C PO), Take 500 mg by mouth daily., Disp: , Rfl:    aspirin 81 MG tablet, Take 81 mg by mouth daily., Disp: , Rfl:    budesonide (PULMICORT) 180 MCG/ACT inhaler, Inhale 1 puff into the lungs daily as needed (asthma)., Disp: , Rfl:    Calcium Carbonate-Vitamin D (CALCIUM 500/D PO), Take 1 tablet by mouth 2 (two) times daily., Disp: , Rfl:    Cholecalciferol (VITAMIN D3) 2000 UNITS TABS, Take 2,000 Units by mouth daily., Disp: , Rfl:    cycloSPORINE (RESTASIS) 0.05 % ophthalmic emulsion, Place 1 drop into both eyes 2 (two) times daily., Disp: , Rfl:    desonide (DESOWEN) 0.05 % lotion, Apply 1 application topically daily as needed (irritation)., Disp: , Rfl:    diclofenac Sodium (VOLTAREN) 1 % GEL, Apply 2-4 grams to affected joint 4 times daily as needed. (Patient taking differently: Apply 2-4 g topically 4 (four) times daily as needed (pain). Apply 2-4 grams to affected joint 4 times daily as needed.), Disp: 400 g, Rfl: 2   Estradiol (VAGIFEM) 10 MCG TABS vaginal tablet, Place 1 tablet (10 mcg total) vaginally 2 (two) times a week. Place one tablet (10 mcg) per  vagina at bedtime twice a week., Disp: 24 tablet, Rfl: 3   famotidine (PEPCID) 40 MG tablet, Take 1 tablet (40 mg total) by mouth 2 (two) times daily., Disp: 180 tablet, Rfl: 3   fluconazole (DIFLUCAN) 100 MG tablet, Take 1 tablet (100 mg total) by mouth daily. Take 272m starting dose then 1060mdaily for 11 days., Disp: 13 tablet, Rfl: 0   fluconazole (DIFLUCAN) 100 MG tablet, Take 1 tablet (100 mg total) by mouth daily. Take 20037mtarting dose then 100m60mily for 11 days., Disp: 13 tablet, Rfl: 0   fluticasone (FLONASE) 50 MCG/ACT nasal spray, USE 2 SPRAYS IN EACH NOSTRIL DAILY AS NEEDED FOR RHINITIS OR ALLERGIES (Patient taking differently: Place 2 sprays into both nostrils daily as needed for allergies. USE 2 SPRAYS IN EACH NOSTRIL DAILY AS NEEDED FOR RHINITIS OR ALLERGIES), Disp: 48 g, Rfl: 3   folic acid (FOLVITE) 1 MG tablet, TAKE 2 TABLETS BY MOUTH DAILY (Patient taking differently: Take 2 mg by mouth daily.), Disp: 180 tablet, Rfl: 3   ibuprofen (ADVIL) 200 MG tablet, Take 200-400 mg by mouth every 8 (eight) hours as needed (pain)., Disp: , Rfl:    Magnesium Malate 1250 (141.7 Mg) MG TABS, Take 3 tablets by mouth daily. , Disp: , Rfl:    methotrexate 50 MG/2ML injection, Inject 0.6 mLs (15 mg total) into the skin once a week., Disp: 8 mL, Rfl: 0   metroNIDAZOLE (METROCREAM) 0.75 % cream, Apply 1 application topically 2 (two) times daily as needed (rosacea)., Disp: , Rfl:    Multiple Vitamin (MULTI-VITAMINS) TABS, Take 1 tablet  by mouth daily., Disp: , Rfl:    Omega-3 1000 MG CAPS, Take 1,000 mg by mouth daily., Disp: , Rfl:    ORENCIA 125 MG/ML SOSY, INJECT THE CONTENTS OF 1 SYRINGE UNDER THE SKIN EVERY WEEK (Patient taking differently: Inject 125 mg into the skin every Wednesday. INJECT THE CONTENTS OF 1 SYRINGE UNDER THE SKIN EVERY WEEK), Disp: 4 mL, Rfl: 2   Propylene Glycol (SYSTANE BALANCE) 0.6 % SOLN, Place 1 drop into both eyes daily as needed (dry eyes)., Disp: , Rfl:     Tuberculin-Allergy Syringes 27G X 1/2" 1 ML KIT, Inject 1 Syringe into the skin once a week. To be used weekly with methotrexate., Disp: , Rfl:    Turmeric 500 MG CAPS, Take 500 mg by mouth daily., Disp: , Rfl:    Zinc 50 MG TABS, Take 50 mg by mouth daily., Disp: , Rfl:    zoledronic acid (RECLAST) 5 MG/100ML SOLN injection, Inject 5 mg into the vein once. Takes yearly, Disp: , Rfl:    zolpidem (AMBIEN) 10 MG tablet, Take 0.5 tablets (5 mg total) by mouth at bedtime as needed for sleep. (Patient taking differently: Take 2.5-5 mg by mouth at bedtime.), Disp: 30 tablet, Rfl: 2      Objective:   Vitals:   07/11/20 1459  BP: 102/62  Pulse: 68  SpO2: 95%  Weight: 179 lb (81.2 kg)  Height: _0  (1.676 m)    Estimated body mass index is 28.89 kg/m as calculated from the following:   Height as of this encounter: _1  (1.676 m).   Weight as of this encounter: 179 lb (81.2 kg).  _2 @  Filed Weights   07/11/20 1459  Weight: 179 lb (81.2 kg)     Physical Exam General: No distress. oease Neuro: Alert and Oriented x 3. GCS 15. Speech normal Psych: Pleasant Resp:  Barrel Chest - no.  Wheeze - no, Crackles - no, No overt respiratory distress CVS: Normal heart sounds. Murmurs - no Ext: Stigmata of Connective Tissue Disease - no HEENT: Normal upper airway. PEERL +. No post nasal drip        Assessment:       ICD-10-CM   1. 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 History of rheumatoid arthritis  Z87.39     4. Immunosuppressed status ([place])  D84.9      The interstitial lung disease very mild.  The progression is minimal.  At this point in time she does not meet criteria for progressive phenotype.  Therefore no indication for antifibrotic's but we will continue to monitor.  No evidence of opportunistic infection in the lung the Candida is  probably contaminant.  I think it is okay to continue with immunosuppressants  Shortness of breath: This is multifactorial.  We will see how rehabilitation works if this does not help then we will have to ask for a more detailed cardiac evaluation or pulmonary stress test [addendum: After she left went through a detailed chart review with my medical assistant noticed that she has not seen Dr. Tamala Julian in a few years at least -therefore we will go ahead and get an echocardiogram because it has been 18 months since she had 1]    Plan:     Patient Instructions     ICD-10-CM   1. Interstitial lung disease due  to connective tissue disease ([place])  J84.89    M35.9   2. ILD (interstitial lung disease) ([place])  J84.9   3. History of rheumatoid arthritis  Z87.39   4. Immunosuppressed status ([place])  D84.9     You have onset of interstitial lung diease (ILD) in 2020 and since then is somewhat progressive based on CT. Based on breathing test appears 5% decline in 7 years . This is very minimal/mild decline  Bronchoscopy with lavage June 07, 2020: Showed growth of Candida albicans  -You are on a course of Diflucan since June 28, 2020  -However I think this is what is called a "contaminant"  -No evidence of other opportunistic infection in the lavage   Glad you  started pulmonary rehabilitation and prior history have been reassured about your cardiac status    Plan -I would support continuing current immunosuppressive regimen without change -No indication for antifibrotic given extremely mild decline -Overall I would advocate continued monitoring of lung function -and if there is evidence of accelerated progression then consider antifibrotic's -If Dr. Keturah Barre wants to consider Imuran at this stage for pain control that would be fine with [place] from a pulmonary perspective  Follow-up   -6 months do spirometry and DLCO -Return to see Dr. [name] in 6 months in a 15-minute slot  addendum: Get  echocardiogram    ( Level 05 visit: Estb 40-54 min    in  visit type: on-site physical face to visit  in total care time and counseling or/and coordination of care by this undersigned MD - Dr Brand Males. This includes one or more of the following on this same day 07/11/2020: pre-charting, chart review, note writing, documentation discussion of test results, diagnostic or treatment recommendations, prognosis, risks and benefits of management options, instructions, education, compliance or risk-factor reduction. It excludes time spent by the [place] or office staff in the care of the patient. Actual time 53 min)  SIGNATURE    Dr. Brand Males, M.D., F.C.C.P,  Pulmonary and Critical Care Medicine Staff Physician, [place] Director - Interstitial Lung Disease  Program  [facility] [place] [place], [place], [address]  Pager: [phone], If no answer or between  15:00h - 7:00h: call [phone] Telephone: [phone]  4:09 PM 07/11/2020

## 2020-07-11 NOTE — Telephone Encounter (Signed)
Pt had appt today 7/6 with MR. Once pt left, MR did a further review of pt's chart and saw that it has been over a year since she has seen cardiology. Last echo that pt had done was about 18 months ago.  Per MR: Have pt get an echo done prior to next appt.  Attempted to call pt but unable to reach. Left message for her to return call.  Once pt returns call we can then get order placed for the echo.

## 2020-07-12 ENCOUNTER — Encounter (HOSPITAL_COMMUNITY)
Admission: RE | Admit: 2020-07-12 | Discharge: 2020-07-12 | Disposition: A | Discharge status: Home / Self Care | Payer: Medicare Other | Source: Ambulatory Visit | Attending: Internal Medicine | Admitting: Internal Medicine

## 2020-07-12 ENCOUNTER — Other Ambulatory Visit: Payer: Self-pay

## 2020-07-12 DIAGNOSIS — J849 Interstitial pulmonary disease, unspecified: Secondary | ICD-10-CM | POA: Diagnosis not present

## 2020-07-12 LAB — FUNGUS CULTURE WITH STAIN

## 2020-07-12 LAB — FUNGUS CULTURE RESULT

## 2020-07-12 LAB — FUNGAL ORGANISM REFLEX

## 2020-07-12 NOTE — Telephone Encounter (Signed)
Called and spoke with patient. She verbalized understanding and wishes to proceed with the echo. She stated that she was is established with Dr. Daneen Schick with cardiology. She wanted to know if MR had another recommendations for a cardiologist. She doesn't have any issues with Dr. Tamala Julian but mentioned that he is getting older and does not want to wait until the last minute to establish with a new cardiologist once he retires.   MR, do you have any recommendations for another cardiologist?

## 2020-07-12 NOTE — Telephone Encounter (Signed)
Called and spoke with pt and she is aware of MR recs.  She is aware that the order for the echo has been placed and nothing further is needed.

## 2020-07-12 NOTE — Telephone Encounter (Signed)
I do not recommend need for change of cardiologist LEt us get echo first

## 2020-07-12 NOTE — Progress Notes (Signed)
Daily Session Note  Patient Details  Name: Deanna Norris MRN: [id number] Date of Birth: 1945-09-13 Referring Provider:   [name] Pulmonary Rehab Walk Test from 07/06/2020 in [place]  Referring Provider Dr. [name]       Encounter Date: 07/12/2020  Check In:  Session Check In - 07/12/20 1501       Check-In   Supervising physician immediately available to respond to emergencies [facility] immediately available    Physician(s) Dr. Cruzita Lederer    Location [facility]    Staff Present [name], RN, [name], MS, ACSM-CEP, Exercise Physiologist;Lisa Colletta [place], RN, MHA    Virtual Visit No    Medication changes reported     No    Fall or balance concerns reported    No    Tobacco Cessation No Change    Warm-up and Cool-down Performed as group-led instruction    Resistance Training Performed Yes    VAD Patient? No    PAD/SET Patient? No      Pain Assessment   Currently in Pain? No/denies    Multiple Pain Sites No             Capillary Blood Glucose: No results found for this or any previous visit (from the past 24 hour(s)).    Social History   Tobacco Use  Smoking Status Never  Smokeless Tobacco Never    Goals Met:  Proper associated with RPD/PD & O2 Sat Exercise tolerated well No report of cardiac concerns or symptoms Strength training completed today  Goals Unmet:  Not Applicable  Comments: Service time is from 1315 to 1430    Dr. [name] is Medical Director for Cardiac Rehab at [facility].

## 2020-07-13 ENCOUNTER — Other Ambulatory Visit: Payer: Self-pay

## 2020-07-13 ENCOUNTER — Ambulatory Visit (INDEPENDENT_AMBULATORY_CARE_PROVIDER_SITE_OTHER): Payer: Medicare Other | Admitting: Rheumatology

## 2020-07-13 ENCOUNTER — Encounter: Payer: Self-pay | Admitting: Rheumatology

## 2020-07-13 ENCOUNTER — Other Ambulatory Visit: Payer: Self-pay | Admitting: Pharmacist

## 2020-07-13 VITALS — BP 120/76 | HR 60 | Resp 15 | Ht 66.0 in | Wt 176.8 lb

## 2020-07-13 DIAGNOSIS — Z79899 Other long term (current) drug therapy: Secondary | ICD-10-CM | POA: Diagnosis not present

## 2020-07-13 DIAGNOSIS — J849 Interstitial pulmonary disease, unspecified: Secondary | ICD-10-CM | POA: Diagnosis not present

## 2020-07-13 DIAGNOSIS — M19071 Primary osteoarthritis, right ankle and foot: Secondary | ICD-10-CM

## 2020-07-13 DIAGNOSIS — Z8639 Personal history of other endocrine, nutritional and metabolic disease: Secondary | ICD-10-CM

## 2020-07-13 DIAGNOSIS — R778 Other specified abnormalities of plasma proteins: Secondary | ICD-10-CM

## 2020-07-13 DIAGNOSIS — M19042 Primary osteoarthritis, left hand: Secondary | ICD-10-CM

## 2020-07-13 DIAGNOSIS — M0579 Rheumatoid arthritis with rheumatoid factor of multiple sites without organ or systems involvement: Secondary | ICD-10-CM

## 2020-07-13 DIAGNOSIS — Z872 Personal history of diseases of the skin and subcutaneous tissue: Secondary | ICD-10-CM | POA: Diagnosis not present

## 2020-07-13 DIAGNOSIS — Z8669 Personal history of other diseases of the nervous system and sense organs: Secondary | ICD-10-CM

## 2020-07-13 DIAGNOSIS — M810 Age-related osteoporosis without current pathological fracture: Secondary | ICD-10-CM

## 2020-07-13 DIAGNOSIS — M5136 Other intervertebral disc degeneration, lumbar region: Secondary | ICD-10-CM

## 2020-07-13 DIAGNOSIS — M19041 Primary osteoarthritis, right hand: Secondary | ICD-10-CM | POA: Diagnosis not present

## 2020-07-13 DIAGNOSIS — Z8719 Personal history of other diseases of the digestive system: Secondary | ICD-10-CM

## 2020-07-13 DIAGNOSIS — M170 Bilateral primary osteoarthritis of knee: Secondary | ICD-10-CM

## 2020-07-13 DIAGNOSIS — M3500 Sicca syndrome, unspecified: Secondary | ICD-10-CM | POA: Diagnosis not present

## 2020-07-13 DIAGNOSIS — M19072 Primary osteoarthritis, left ankle and foot: Secondary | ICD-10-CM

## 2020-07-13 MED ORDER — ZOLPIDEM TARTRATE 10 MG PO TABS: 5.0000 mg | ORAL_TABLET | Freq: Every evening | ORAL | 2 refills | Status: DC | PRN

## 2020-07-13 MED ORDER — ORENCIA 125 MG/ML ~~LOC~~ SOSY: PREFILLED_SYRINGE | SUBCUTANEOUS | 2 refills | Status: DC

## 2020-07-13 NOTE — Telephone Encounter (Signed)
Patient was seen in the office today and June 2022 labs are sufficient per Dr. Estanislado Pandy.

## 2020-07-13 NOTE — Progress Notes (Addendum)
Next infusion not yet scheduled for Reclast and due for updated orders She is due for Reclast on or after 07/19/20  This will be her 5th Reclast infusion and she will get repeat DEXA in February 2023 and have drug holiday from Reclast. Previous doses: 06/19/16, 06/29/17, 07/08/2018, 07/20/19  Last Clinic Visit: 07/13/20 Next Clinic Visit: 10/23/20  Last infusion: 07/20/19 Labs: CBC/CMP/Vitamin D on 06/18/20 - wnl to proceed with Reclast  Orders placed for Reclast 5mg  IV x 1 dose along with premedication of Tylenol and Benadryl to be administered 30 minutes before medication infusion.  Called patient and provided with phone number for: [facility] [phone]) Lady [place]   Will f/u to ensure scheduled and completed  Knox Saliva, PharmD, MPH Clinical Pharmacist (Rheumatology and Pulmonology)  Addendum: 7/18 - patient has not yet scheduled Reclast. Called patient today to advise to reach out to Medical Day to have scheduled. Provided phone number for Medical Day. Will f/u to ensure scheduled and completed  7/25 - Reclast scheduled for 08/08/20 at [facility]. Will f/u to ensure completed

## 2020-07-13 NOTE — Telephone Encounter (Signed)
Pending TPMT results, patient will be starting imuran per Hazel Sams, PA-C. Thanks!   Consent obtained and sent to the scan center.

## 2020-07-13 NOTE — Telephone Encounter (Signed)
Reclast order placed for [facility]. Called patient to provide number, but unable to reach. Left VM with phone number for [facility] and advised that she is due on or after 07/19/20. Sent MyChart message to patient to advise. Will f/u to ensure scheduled and completed  Knox Saliva, PharmD, MPH Clinical Pharmacist (Rheumatology and Pulmonology)

## 2020-07-13 NOTE — Patient Instructions (Addendum)
Standing Labs We placed an order today for your standing lab work.   Please have your standing labs drawn in 2 weeks x2, 2 months, then every 3 months   If possible, please have your labs drawn 2 weeks prior to your appointment so that the provider can discuss your results at your appointment.  Please note that you may see your imaging and lab results in [place] before we have reviewed them. We may be awaiting multiple results to interpret others before contacting you. Please allow our office up to 72 hours to thoroughly review all of the results before contacting the office for clarification of your results.  We have open lab daily: Monday through Thursday from 1:30-4:30 PM and Friday from 1:30-4:00 PM at the office of Dr. Bo Merino, [facility].   Please be advised, all patients with office appointments requiring lab work will take precedent over walk-in lab work.  If possible, please come for your lab work on Monday and Friday afternoons, as you may experience shorter wait times. The office is located at [address] [place], [place], [place] [address] No appointment is necessary.   Labs are drawn by [facility]. Please bring your co-pay at the time of your lab draw.  You may receive a bill from [facility] for your lab work.  If you wish to have your labs drawn at another location, please call the office 24 hours in advance to send orders.  If you have any questions regarding directions or hours of operation,  please call [phone].   As a reminder, please drink plenty of water prior to coming for your lab work. Thanks!   Azathioprine tablets What is this medication? AZATHIOPRINE (ay za THYE oh preen) suppresses the immune system. It is used to prevent organ rejection after a transplant. It is also used to treat rheumatoidarthritis. This medicine may be used for other purposes; ask your health care provider orpharmacist if you have questions. COMMON BRAND NAME(S):  Azasan, Imuran What should I tell my care team before I take this medication? They need to know if you have any of these conditions: infection kidney disease liver disease an unusual or allergic reaction to azathioprine, other medicines, lactose, foods, dyes, or preservatives pregnant or trying to get pregnant breast feeding How should I use this medication? Take this medicine by mouth with a full glass of water. Follow the directions on the prescription label. Take your medicine at regular intervals. Do not take your medicine more often than directed. Continue to take your medicine even ifyou feel better. Do not stop taking except on your doctor's advice. Talk to your pediatrician regarding the use of this medicine in children.Special care may be needed. Overdosage: If you think you have taken too much of this medicine contact apoison control center or emergency room at once. NOTE: This medicine is only for you. Do not share this medicine with others. What if I miss a dose? If you miss a dose, take it as soon as you can. If it is almost time for yournext dose, take only that dose. Do not take double or extra doses. What may interact with this medication? Do not take this medicine with any of the following medications: febuxostat mercaptopurine This medicine may also interact with the following medications: allopurinol aminosalicylates like sulfasalazine, mesalamine, balsalazide, and olsalazine leflunomide medicines called ACE inhibitors like benazepril, captopril, enalapril, fosinopril, quinapril, lisinopril, ramipril, and trandolapril mycophenolate sulfamethoxazole; trimethoprim vaccines warfarin This list may not describe all possible  interactions. Give your health care provider a list of all the medicines, herbs, non-prescription drugs, or dietary supplements you use. Also tell them if you smoke, drink alcohol, or use illegaldrugs. Some items may interact with your medicine. What  should I watch for while using this medication? Visit your doctor or health care professional for regular checks on your progress. You will need frequent blood checks during the first few months youare receiving the medicine. If you get a cold or other infection while receiving this medicine, call your doctor or health care professional. Do not treat yourself. The medicine mayincrease your risk of getting an infection. Women should inform their doctor if they wish to become pregnant or think they might be pregnant. There is a potential for serious side effects to an unbornchild. Talk to your health care professional or pharmacist for more information. Men may have a reduced sperm count while they are taking this medicine. Talk toyour health care professional for more information. This medicine may increase your risk of getting certain kinds of cancer. Talk to your doctor about healthy lifestyle choices, important screenings, and yourrisk. What side effects may I notice from receiving this medication? Side effects that you should report to your doctor or health care professionalas soon as possible: allergic reactions like skin rash, itching or hives, swelling of the face, lips, or tongue changes in vision confusion fever, chills, or any other sign of infection loss of balance or coordination severe stomach pain unusual bleeding, bruising unusually weak or tired vomiting yellowing of the eyes or skin Side effects that usually do not require medical attention (report to yourdoctor or health care professional if they continue or are bothersome): hair loss nausea This list may not describe all possible side effects. Call your doctor for medical advice about side effects. You may report side effects to FDA at1-800-FDA-1088. Where should I keep my medication? Keep out of the reach of children. Store at room temperature between 15 and 25 degrees C (59 and 77 degrees F).Protect from light. Throw away  any unused medicine after the expiration date. NOTE: This sheet is a summary. It may not cover all possible information. If you have questions about this medicine, talk to your doctor, pharmacist, orhealth care provider.  2022 Elsevier/Gold Standard (2013-04-19 12:00:31)   Vaccines You are taking a medication(s) that can suppress your immune system.  The following immunizations are recommended: Flu annually Covid-19  Td/Tdap (tetanus, diphtheria, pertussis) every 10 years Pneumonia (Prevnar 15 then Pneumovax 23 at least 1 year apart.  Alternatively, can take Prevnar 20 without needing additional dose) Shingrix (after age 24): 2 doses from 4 weeks to 6 months apart  Please check with your PCP to make sure you are up to date.  If you test POSITIVE for COVID19 and have MILD to MODERATE symptoms: First, call your PCP if you would like to receive COVID19 treatment AND Hold your medications during the infection and for at least 1 week after your symptoms have resolved: Injectable medication (Benlysta, Cimzia, Cosentyx, Enbrel, Humira, Orencia, Remicade, Simponi, Stelara, Taltz, Tremfya) Methotrexate Leflunomide (Arava) Mycophenolate (Cellcept) Morrie Sheldon, Olumiant, or Rinvoq If you take Actemra or Kevzara, you DO NOT need to hold these for COVID19 infection.  If you test POSITIVE for COVID19 and have NO symptoms: First, call your PCP if you would like to receive COVID19 treatment AND Hold your medications for at least 10 days after the day that you tested positive Injectable medication (Benlysta, Cimzia, Cosentyx, Enbrel, Humira, Orencia, Remicade,  Simponi, Stelara, Fanny Dance) Methotrexate Leflunomide (Arava) Mycophenolate (Cellcept) Roma Kayser, or Rinvoq If you take Actemra or Kevzara, you DO NOT need to hold these for COVID19 infection.  If you have signs or symptoms of an infection or start antibiotics: First, call your PCP for workup of your infection. Hold your medication  through the infection, until you complete your antibiotics, and until symptoms resolve if you take the following: Injectable medication (Actemra, Benlysta, Cimzia, Cosentyx, Enbrel, Humira, Kevzara, Orencia, Remicade, Simponi, Stelara, Taltz, Tremfya) Methotrexate Leflunomide (Arava) Mycophenolate (Cellcept) Morrie Sheldon, Olumiant, or Rinvoq  Heart Disease Prevention   Your inflammatory disease increases your risk of heart disease which includes heart attack, stroke, atrial fibrillation (irregular heartbeats), high blood pressure, heart failure and atherosclerosis (plaque in the arteries).  It is important to reduce your risk by:   Keep blood pressure, cholesterol, and blood sugar at healthy levels   Smoking Cessation   Maintain a healthy weight  BMI 20-25   Eat a healthy diet  Plenty of fresh fruit, vegetables, and whole grains  Limit saturated fats, foods high in sodium, and added sugars  DASH and Mediterranean diet   Increase physical activity  Recommend moderate physically activity for 150 minutes per week/ 30 minutes a day for five days a week These can be broken up into three separate ten-minute sessions during the day.   Reduce Stress  Meditation, slow breathing exercises, yoga, coloring books  Dental visits twice a year

## 2020-07-15 NOTE — Progress Notes (Signed)
Diflucan already prescriobe

## 2020-07-17 ENCOUNTER — Encounter (HOSPITAL_COMMUNITY)
Admission: RE | Admit: 2020-07-17 | Discharge: 2020-07-17 | Disposition: A | Discharge status: Home / Self Care | Payer: Medicare Other | Source: Ambulatory Visit | Attending: Internal Medicine | Admitting: Internal Medicine

## 2020-07-17 ENCOUNTER — Other Ambulatory Visit: Payer: Self-pay

## 2020-07-17 VITALS — Wt 177.9 lb

## 2020-07-17 DIAGNOSIS — J849 Interstitial pulmonary disease, unspecified: Secondary | ICD-10-CM

## 2020-07-17 NOTE — Progress Notes (Signed)
[name] B Cecchi 75 y.o. female Nutrition Note  Diagnosis: ILD  Past Medical History:  Diagnosis Date   Asthma    Constipation    Fibroid    GERD (gastroesophageal reflux disease)    Insomnia    Lumbar vertebral fracture (HCC)    L5-S1   Menopausal symptoms    Osteoporosis    Postmenopausal HRT (hormone replacement therapy)    RA (rheumatoid arthritis) ([place]) 11/2004   methotrexate and Embrel   Rosacea    Sjogren's disease ([place])    Sleep apnea 2008   C-Pap   Vitamin D deficiency      Medications reviewed.   Current Outpatient Medications:    acetaminophen (TYLENOL) 500 MG tablet, Take 500-1,000 mg by mouth every 8 (eight) hours as needed for moderate pain. (Patient not taking: Reported on 07/13/2020), Disp: , Rfl:    albuterol (PROAIR HFA) 108 (90 Base) MCG/ACT inhaler, Inhale 2 puffs into the lungs every 4 (four) hours as needed for wheezing or shortness of breath., Disp: 18 g, Rfl: 3   Ascorbic Acid (VITAMIN C PO), Take 500 mg by mouth daily., Disp: , Rfl:    aspirin 81 MG tablet, Take 81 mg by mouth daily., Disp: , Rfl:    budesonide (PULMICORT) 180 MCG/ACT inhaler, Inhale 1 puff into the lungs daily as needed (asthma)., Disp: , Rfl:    Calcium Carbonate-Vitamin D (CALCIUM 500/D PO), Take 1 tablet by mouth 2 (two) times daily., Disp: , Rfl:    Cholecalciferol (VITAMIN D3) 2000 UNITS TABS, Take 2,000 Units by mouth daily., Disp: , Rfl:    cycloSPORINE (RESTASIS) 0.05 % ophthalmic emulsion, Place 1 drop into both eyes 2 (two) times daily., Disp: , Rfl:    desonide (DESOWEN) 0.05 % lotion, Apply 1 application topically daily as needed (irritation)., Disp: , Rfl:    diclofenac Sodium (VOLTAREN) 1 % GEL, Apply 2-4 grams to affected joint 4 times daily as needed. (Patient taking differently: Apply 2-4 g topically 4 (four) times daily as needed (pain). Apply 2-4 grams to affected joint 4 times daily as needed.), Disp: 400 g, Rfl: 2   Estradiol (VAGIFEM) 10 MCG TABS vaginal tablet,  Place 1 tablet (10 mcg total) vaginally 2 (two) times a week. Place one tablet (10 mcg) per vagina at bedtime twice a week., Disp: 24 tablet, Rfl: 3   famotidine (PEPCID) 40 MG tablet, Take 1 tablet (40 mg total) by mouth 2 (two) times daily., Disp: 180 tablet, Rfl: 3   fluconazole (DIFLUCAN) 100 MG tablet, Take 1 tablet (100 mg total) by mouth daily. Take 200mg  starting dose then 100mg  daily for 11 days., Disp: 13 tablet, Rfl: 0   fluconazole (DIFLUCAN) 100 MG tablet, Take 1 tablet (100 mg total) by mouth daily. Take 200mg  starting dose then 100mg  daily for 11 days. (Patient not taking: Reported on 07/13/2020), Disp: 13 tablet, Rfl: 0   fluticasone (FLONASE) 50 MCG/ACT nasal spray, USE 2 SPRAYS IN EACH NOSTRIL DAILY AS NEEDED FOR RHINITIS OR ALLERGIES (Patient taking differently: Place 2 sprays into both nostrils daily as needed for allergies. USE 2 SPRAYS IN EACH NOSTRIL DAILY AS NEEDED FOR RHINITIS OR ALLERGIES), Disp: 48 g, Rfl: 3   folic acid (FOLVITE) 1 MG tablet, TAKE 2 TABLETS BY MOUTH DAILY (Patient taking differently: Take 2 mg by mouth daily.), Disp: 180 tablet, Rfl: 3   ibuprofen (ADVIL) 200 MG tablet, Take 200-400 mg by mouth every 8 (eight) hours as needed (pain)., Disp: , Rfl:  Magnesium Malate 1250 (141.7 Mg) MG TABS, Take 3 tablets by mouth daily. , Disp: , Rfl:    methotrexate 50 MG/2ML injection, Inject 0.6 mLs (15 mg total) into the skin once a week., Disp: 8 mL, Rfl: 0   metroNIDAZOLE (METROCREAM) 0.75 % cream, Apply 1 application topically 2 (two) times daily as needed (rosacea)., Disp: , Rfl:    Multiple Vitamin (MULTI-VITAMINS) TABS, Take 1 tablet by mouth daily., Disp: , Rfl:    Omega-3 1000 MG CAPS, Take 1,000 mg by mouth daily., Disp: , Rfl:    ORENCIA 125 MG/ML SOSY, INJECT THE CONTENTS OF 1 SYRINGE UNDER THE SKIN EVERY WEEK, Disp: 4 mL, Rfl: 2   Propylene Glycol (SYSTANE BALANCE) 0.6 % SOLN, Place 1 drop into both eyes daily as needed (dry eyes)., Disp: , Rfl:    Turmeric  500 MG CAPS, Take 500 mg by mouth daily., Disp: , Rfl:    Zinc 50 MG TABS, Take 50 mg by mouth daily., Disp: , Rfl:    zoledronic acid (RECLAST) 5 MG/100ML SOLN injection, Inject 5 mg into the vein once. Takes yearly, Disp: , Rfl:    zolpidem (AMBIEN) 10 MG tablet, Take 0.5 tablets (5 mg total) by mouth at bedtime as needed for sleep., Disp: 30 tablet, Rfl: 2   Ht Readings from Last 1 Encounters:  07/13/20 5\' 6"  (1.676 m)     Wt Readings from Last 3 Encounters:  07/13/20 176 lb 12.8 oz (80.2 kg)  07/11/20 179 lb (81.2 kg)  07/06/20 179 lb 3.7 oz (81.3 kg)     There is no height or weight on file to calculate BMI.   Social History   Tobacco Use  Smoking Status Never  Smokeless Tobacco Never      Nutrition Note  Spoke with pt. Nutrition Plan and Nutrition Survey goals reviewed with pt.  Pt is following a healthy diet per PYP results. She states primary nutrition goal is weight loss. Reviewed weight history. She has done weight watchers. She reports some weight cycling throughout her life. She thinks she will breath better if she loses weight. She wants to lose about 10 lbs.    Pt expressed understanding of the information reviewed.    Nutrition Diagnosis  Overweight  related to excessive energy intake as evidenced by a BMI 28.54 kg/m2 and pt stated goal to lose weight during pulmonary rehab   Nutrition Intervention Pt's individual nutrition plan reviewed with pt. Benefits of adopting healthy diet reviewed with Rate My Plate survey    Continue client-centered nutrition education by RD, as part of interdisciplinary care.  Goal(s)  Pt to identify food quantities necessary to achieve weight loss of 6-24 lb at graduation from cardiac rehab.  Pt to build a healthy plate including vegetables, fruits, whole grains, and low-fat dairy products in a heart healthy meal plan.  Plan:  Will provide client-centered nutrition education as part of interdisciplinary care Monitor and  evaluate progress toward nutrition goal with team.   Michaele Offer, MS, RDN, LDN

## 2020-07-17 NOTE — Progress Notes (Signed)
Daily Session Note  Patient Details  Name: Deanna Norris MRN: [id number] Date of Birth: 1945-04-23 Referring Provider:   [name] Pulmonary Rehab Walk Test from 07/06/2020 in [place]  Referring Provider Dr. [name]       Encounter Date: 07/17/2020  Check In:  Session Check In - 07/17/20 1432       Check-In   Supervising physician immediately available to respond to emergencies [facility] immediately available    Physician(s) Dr. Cruzita Lederer    Location [facility]    Staff Present [name], RN, BSN;Josejuan Hoaglin [name], RN;David [place], MS, ACSM-CEP, CCRP, Exercise Physiologist;Jessica Hassell Done, MS, ACSM-CEP, Exercise Physiologist    Virtual Visit No    Medication changes reported     No    Fall or balance concerns reported    No    Tobacco Cessation No Change    Warm-up and Cool-down Performed as group-led instruction    Resistance Training Performed Yes    VAD Patient? No    PAD/SET Patient? No      Pain Assessment   Currently in Pain? No/denies    Multiple Pain Sites No             Capillary Blood Glucose: No results found for this or any previous visit (from the past 24 hour(s)).   Exercise Prescription Changes - 07/17/20 1500       Response to Exercise   Blood Pressure (Admit) 133/70    Blood Pressure (Exercise) 138/78    Blood Pressure (Exit) 110/58    Heart Rate (Admit) 65 bpm    Heart Rate (Exercise) 79 bpm    Heart Rate (Exit) 73 bpm    Oxygen Saturation (Admit) 94 %    Oxygen Saturation (Exercise) 95 %    Oxygen Saturation (Exit) 97 %    Rating of Perceived Exertion (Exercise) 9    Perceived Dyspnea (Exercise) 1    Duration Continue with 30 min of aerobic exercise without signs/symptoms of physical distress.    Intensity Other (comment)   40-80% of HRR     Progression   Progression Continue to progress workloads to maintain intensity without signs/symptoms of physical distress.       Resistance Training   Training Prescription Yes    Weight Red bands    Reps 10-15    Time 10 Minutes      NuStep   Level 2    SPM 70    Minutes 15    METs 2      Track   Laps 21    Minutes 15             Social History   Tobacco Use  Smoking Status Never  Smokeless Tobacco Never    Goals Met:  Exercise tolerated well No report of cardiac concerns or symptoms Strength training completed today  Goals Unmet:  Not Applicable  Comments: Service time is from 1319 to 1439    Dr. [name] is Medical Director for Cardiac Rehab at [facility].

## 2020-07-18 ENCOUNTER — Ambulatory Visit: Payer: Medicare Other

## 2020-07-18 ENCOUNTER — Telehealth: Payer: Self-pay

## 2020-07-18 NOTE — Progress Notes (Deleted)
Subjective:   Deanna Norris is a 75 y.o. female who presents for Medicare Annual (Subsequent) preventive examination.  Review of Systems: N/A   ***       Objective:    There were no vitals filed for this visit. There is no height or weight on file to calculate BMI.  Advanced Directives 06/07/2020 06/16/2019 04/08/2018 02/15/2018 04/01/2017 03/31/2016 03/28/2015  Does Patient Have a Medical Advance Directive? Yes Yes Yes Yes Yes Yes Yes  Type of Paramedic of [place];Living will [place];Living will [place];Living will [place];Living will;Out of facility DNR (pink MOST or yellow form) [place];Living will [place];Living will [place];Living will  Does patient want to make changes to medical advance directive? - - - - - - No - Patient declined  Copy of [place] in Chart? No - copy requested No - copy requested No - copy requested No - copy requested No - copy requested No - copy requested No - copy requested    Current Medications (verified) Outpatient Encounter Medications as of 07/18/2020  Medication Sig   acetaminophen (TYLENOL) 500 MG tablet Take 500-1,000 mg by mouth every 8 (eight) hours as needed for moderate pain. (Patient not taking: Reported on 07/13/2020)   albuterol (PROAIR HFA) 108 (90 Base) MCG/ACT inhaler Inhale 2 puffs into the lungs every 4 (four) hours as needed for wheezing or shortness of breath.   Ascorbic Acid (VITAMIN C PO) Take 500 mg by mouth daily.   aspirin 81 MG tablet Take 81 mg by mouth daily.   budesonide (PULMICORT) 180 MCG/ACT inhaler Inhale 1 puff into the lungs daily as needed (asthma).   Calcium Carbonate-Vitamin D (CALCIUM 500/D PO) Take 1 tablet by mouth 2 (two) times daily.   Cholecalciferol (VITAMIN D3) 2000 UNITS TABS Take 2,000 Units by mouth daily.   cycloSPORINE (RESTASIS) 0.05 %  ophthalmic emulsion Place 1 drop into both eyes 2 (two) times daily.   desonide (DESOWEN) 0.05 % lotion Apply 1 application topically daily as needed (irritation).   diclofenac Sodium (VOLTAREN) 1 % GEL Apply 2-4 grams to affected joint 4 times daily as needed. (Patient taking differently: Apply 2-4 g topically 4 (four) times daily as needed (pain). Apply 2-4 grams to affected joint 4 times daily as needed.)   Estradiol (VAGIFEM) 10 MCG TABS vaginal tablet Place 1 tablet (10 mcg total) vaginally 2 (two) times a week. Place one tablet (10 mcg) per vagina at bedtime twice a week.   famotidine (PEPCID) 40 MG tablet Take 1 tablet (40 mg total) by mouth 2 (two) times daily.   fluconazole (DIFLUCAN) 100 MG tablet Take 1 tablet (100 mg total) by mouth daily. Take 200mg  starting dose then 100mg  daily for 11 days.   fluconazole (DIFLUCAN) 100 MG tablet Take 1 tablet (100 mg total) by mouth daily. Take 200mg  starting dose then 100mg  daily for 11 days. (Patient not taking: Reported on 07/13/2020)   fluticasone (FLONASE) 50 MCG/ACT nasal spray USE 2 SPRAYS IN EACH NOSTRIL DAILY AS NEEDED FOR RHINITIS OR ALLERGIES (Patient taking differently: Place 2 sprays into both nostrils daily as needed for allergies. USE 2 SPRAYS IN EACH NOSTRIL DAILY AS NEEDED FOR RHINITIS OR ALLERGIES)   folic acid (FOLVITE) 1 MG tablet TAKE 2 TABLETS BY MOUTH DAILY (Patient taking differently: Take 2 mg by mouth daily.)   ibuprofen (ADVIL) 200 MG tablet Take 200-400 mg  by mouth every 8 (eight) hours as needed (pain).   Magnesium Malate 1250 (141.7 Mg) MG TABS Take 3 tablets by mouth daily.    methotrexate 50 MG/2ML injection Inject 0.6 mLs (15 mg total) into the skin once a week.   metroNIDAZOLE (METROCREAM) 0.75 % cream Apply 1 application topically 2 (two) times daily as needed (rosacea).   Multiple Vitamin (MULTI-VITAMINS) TABS Take 1 tablet by mouth daily.   Omega-3 1000 MG CAPS Take 1,000 mg by mouth daily.   ORENCIA 125 MG/ML SOSY  INJECT THE CONTENTS OF 1 SYRINGE UNDER THE SKIN EVERY WEEK   Propylene Glycol (SYSTANE BALANCE) 0.6 % SOLN Place 1 drop into both eyes daily as needed (dry eyes).   Turmeric 500 MG CAPS Take 500 mg by mouth daily.   Zinc 50 MG TABS Take 50 mg by mouth daily.   zoledronic acid (RECLAST) 5 MG/100ML SOLN injection Inject 5 mg into the vein once. Takes yearly   zolpidem (AMBIEN) 10 MG tablet Take 0.5 tablets (5 mg total) by mouth at bedtime as needed for sleep.   No facility-administered encounter medications on file as of 07/18/2020.    Allergies (verified) Adalimumab, Diclofenac, Dicyclomine, Doxycycline hyclate, Enbrel [etanercept], Fosamax [alendronate sodium], Methotrexate derivatives, Nickel, Piroxicam, Salagen [pilocarpine hcl], Sulfamethoxazole-trimethoprim, Tramadol hcl, and Plaquenil [hydroxychloroquine]   History: Past Medical History:  Diagnosis Date   Asthma    Constipation    Fibroid    GERD (gastroesophageal reflux disease)    Insomnia    Lumbar vertebral fracture (HCC)    L5-S1   Menopausal symptoms    Osteoporosis    Postmenopausal HRT (hormone replacement therapy)    RA (rheumatoid arthritis) ([place]) 11/2004   methotrexate and Embrel   Rosacea    Sjogren's disease ([place])    Sleep apnea 2008   C-Pap   Vitamin D deficiency    Past Surgical History:  Procedure Laterality Date   ABDOMINAL HYSTERECTOMY  01/1993   partial with AP repair, secondary to fibroids   BREAST SURGERY  1991   breast biopsy   BRONCHIAL WASHINGS  06/07/2020   Procedure: BRONCHIAL WASHINGS;  Surgeon: Brand Males, MD;  Location: [facility];  Service: Endoscopy;;   CYSTOCELE REPAIR  10/2007   with graft   MELANOMA EXCISION Right 01/2012   excision right posterior arm   NASAL SINUS SURGERY  07/1991   VAGINAL DELIVERY     x3   VIDEO BRONCHOSCOPY N/A 06/07/2020   Procedure: VIDEO BRONCHOSCOPY WITHOUT FLUORO;  Surgeon: Brand Males, MD;  Location: [facility];  Service: Endoscopy;   Laterality: N/A;   Family History  Problem Relation Age of Onset   Breast cancer Sister 68   Thyroid disease Sister    Cancer Sister 34       Leukemia--CLL   Heart disease Brother    Social History   Socioeconomic History   Marital status: Married    Spouse name: Not on file   Number of children: Not on file   Years of education: Not on file   Highest education level: Not on file  Occupational History   Occupation: housewife    Employer: Not Employed  Tobacco Use   Smoking status: Never   Smokeless tobacco: Never  Vaping Use   Vaping Use: Never used  Substance and Sexual Activity   Alcohol use: No    Alcohol/week: 0.0 standard drinks   Drug use: No   Sexual activity: Yes    Partners: Male    Birth  control/protection: Surgical    Comment: TAH  Other Topics Concern   Not on file  Social History Narrative   Not on file   Social Determinants of Health   Financial Resource Strain: Not on file  Food Insecurity: Not on file  Transportation Needs: Not on file  Physical Activity: Not on file  Stress: Not on file  Social Connections: Not on file    Tobacco Counseling Counseling given: Not Answered   Clinical Intake:                 Diabetic: No Nutrition Risk Assessment:  Has the patient had any N/V/D within the last 2 months?  {YES/NO:21197} Does the patient have any non-healing wounds?  {YES/NO:21197} Has the patient had any unintentional weight loss or weight gain?  {YES/NO:21197}  Diabetes:  Is the patient diabetic?  No  If diabetic, was a CBG obtained today?   N/A Did the patient bring in their glucometer from home?   N/A How often do you monitor your CBG's? N/A.   Financial Strains and Diabetes Management:  Are you having any financial strains with the device, your supplies or your medication?  N/A .  Does the patient want to be seen by Chronic Care Management for management of their diabetes?   N/A Would the patient like to be referred  to a Nutritionist or for Diabetic Management?   N/A          Activities of Daily Living No flowsheet data found.  Patient Care Team: Tower, [name], MD as PCP - Kennon Portela, MD as Consulting Physician (Dermatology) Juanita Craver, MD as Consulting Physician (Gastroenterology) Marica Otter, OD as Consulting Physician (Optometry) Belva Crome, MD as Consulting Physician (Cardiology)  Indicate any recent Medical Services you may have received from other than [facility] providers in the past year (date may be approximate).     Assessment:   This is a routine wellness examination for [name].  Hearing/Vision screen No results found.  Dietary issues and exercise activities discussed:     Goals Addressed   None    Depression Screen PHQ 2/9 Scores 07/06/2020 07/02/2020 06/16/2019 04/08/2018 04/01/2017 03/31/2016 03/28/2015  PHQ - 2 Score 0 0 0 0 0 0 0  PHQ- 9 Score 2 4 0 0 0 - -    Fall Risk Fall Risk  06/16/2019 04/08/2018 04/01/2017 03/31/2016 03/28/2015  Falls in the past year? 0 0 No No No  Number falls in past yr: 0 - - - -  Injury with Fall? 0 - - - -  Risk Factor Category  - - - - -  Risk for fall due to : No Fall Risks - - - -  Follow up Falls prevention discussed;Falls evaluation completed - - - -    FALL RISK PREVENTION PERTAINING TO THE HOME:  Any stairs in or around the home? Yes  If so, are there any without handrails? No  Home free of loose throw rugs in walkways, pet beds, electrical cords, etc? Yes  Adequate lighting in your home to reduce risk of falls? Yes   ASSISTIVE DEVICES UTILIZED TO PREVENT FALLS:  Life alert? {YES/NO:21197} Use of a cane, walker or w/c? {YES/NO:21197} Grab bars in the bathroom? {YES/NO:21197} Shower chair or bench in shower? {YES/NO:21197} Elevated toilet seat or a handicapped toilet? {YES/NO:21197}  TIMED UP AND GO:  Was the test performed?  N/A virtual/telephone visit .    Cognitive Function: MMSE - Mini Mental State Exam  06/16/2019 04/08/2018 04/01/2017 03/31/2016 03/28/2015  Not completed: - Unable to complete - - -  Orientation to time 5 - 5 5 5   Orientation to Place 5 - 5 5 5   Registration 3 - 3 3 3   Attention/ Calculation 5 - 0 0 0  Recall 3 - 3 3 3   Language- name 2 objects - - 0 0 0  Language- repeat 1 - 1 1 1   Language- follow 3 step command - - 3 3 3   Language- read & follow direction - - 0 0 0  Write a sentence - - 0 0 0  Copy design - - 0 0 0  Total score - - 20 20 20   Mini Cog  Mini-Cog screen was completed. Maximum score is 22. A value of 0 denotes this part of the MMSE was not completed or the patient failed this part of the Mini-Cog screening.       Immunizations Immunization History  Administered Date(s) Administered   DTaP 04/06/2013   Influenza Split 10/22/2010   Influenza Whole 10/10/2009, 10/20/2011   Influenza, High Dose Seasonal PF 10/14/2016, 10/08/2018, 10/07/2019   Influenza, Seasonal, Injecte, Preservative Fre 10/06/2012   Influenza,inj,Quad PF,6+ Mos 10/30/2014   Influenza,inj,quad, With Preservative 10/12/2017   Influenza-Unspecified 10/26/2013, 10/22/2015   PFIZER(Purple Top)SARS-COV-2 Vaccination 06/02/2019, 08/26/2019   Pneumococcal Conjugate-13 02/13/2014   Pneumococcal Polysaccharide-23 06/12/2010, 03/28/2015   Td 02/07/2003, 03/23/2013   Tdap 03/23/2013   Zoster, Live 05/08/2014    TDAP status: Up to date  Flu Vaccine status: Up to date  Pneumococcal vaccine status: Up to date  {Covid-19 vaccine status:2101808}  Qualifies for Shingles Vaccine? Yes   Zostavax completed Yes   Shingrix Completed?: No.    Education has been provided regarding the importance of this vaccine. Patient has been advised to call insurance company to determine out of pocket expense if they have not yet received this vaccine. Advised may also receive vaccine at local pharmacy or Health Dept. Verbalized acceptance and understanding.  Screening Tests Health Maintenance  Topic Date  Due   Zoster Vaccines- Shingrix (1 of 2) Never done   COVID-19 Vaccine (3 - Pfizer risk series) 09/23/2019   INFLUENZA VACCINE  08/06/2020   MAMMOGRAM  02/16/2021   COLONOSCOPY (Pts 45-11yrs Insurance coverage will need to be confirmed)  01/13/2023   TETANUS/TDAP  03/24/2023   DEXA SCAN  Completed   Hepatitis C Screening  Completed   PNA vac Low Risk Adult  Completed   HPV VACCINES  Aged Out    Health Maintenance  Health Maintenance Due  Topic Date Due   Zoster Vaccines- Shingrix (1 of 2) Never done   COVID-19 Vaccine (3 - Pfizer risk series) 09/23/2019    Colorectal cancer screening: Type of screening: Colonoscopy. Completed 01/12/2013. Repeat every 10 years  Mammogram status: Completed 02/17/2020. Repeat every year  Bone Density status: Completed 02/07/2019. Results reflect: Bone density results: OSTEOPOROSIS. Repeat every 2 years.  Lung Cancer Screening: (Low Dose CT Chest recommended if Age 92-80 years, 30 pack-year currently smoking OR have quit w/in 15years.) does not qualify.   Additional Screening:  Hepatitis C Screening: does qualify; Completed 10/31/2015  Vision Screening: Recommended annual ophthalmology exams for early detection of glaucoma and other disorders of the eye. Is the patient up to date with their annual eye exam?  {YES/NO:21197} Who is the provider or what is the name of the office in which the patient attends annual eye exams? *** If pt is not established with a  provider, would they like to be referred to a provider to establish care? No .   Dental Screening: Recommended annual dental exams for proper oral hygiene  Community Resource Referral / Chronic Care Management: CRR required this visit?  No   CCM required this visit?  No      Plan:     I have personally reviewed and noted the following in the patient's chart:   Medical and social history Use of alcohol, tobacco or illicit drugs  Current medications and supplements including opioid  prescriptions.  Functional ability and status Nutritional status Physical activity Advanced directives List of other physicians Hospitalizations, surgeries, and ER visits in previous 12 months Vitals Screenings to include cognitive, depression, and falls Referrals and appointments  In addition, I have reviewed and discussed with patient certain preventive protocols, quality metrics, and best practice recommendations. A written personalized care plan for preventive services as well as general preventive health recommendations were provided to patient.   Due to this being a virtual/telephonic visit, the after visit summary with patients personalized plan was offered to patient via office or my-chart. Patient preferred to pick up at office at next visit or via mychart.   Andrez Grime, LPN   [phone]/[phone]

## 2020-07-18 NOTE — Telephone Encounter (Signed)
Called patient to complete her AWV. Patient states that she was currently experiencing an unforeseeable event with one of her grandchildren and would not be able to complete it at this time. Appointment cancelled per patient request. Will reschedule at a later date.

## 2020-07-19 ENCOUNTER — Encounter (HOSPITAL_COMMUNITY)
Admission: RE | Admit: 2020-07-19 | Discharge: 2020-07-19 | Disposition: A | Discharge status: Home / Self Care | Payer: Medicare Other | Source: Ambulatory Visit | Attending: Internal Medicine | Admitting: Internal Medicine

## 2020-07-19 ENCOUNTER — Other Ambulatory Visit: Payer: Self-pay

## 2020-07-19 DIAGNOSIS — J849 Interstitial pulmonary disease, unspecified: Secondary | ICD-10-CM | POA: Diagnosis not present

## 2020-07-19 NOTE — Progress Notes (Signed)
Daily Session Note  Patient Details  Name: Deanna Norris MRN: [id number] Date of Birth: Sep 01, 1945 Referring Provider:   April Norris Pulmonary Rehab Walk Test from 07/06/2020 in [place]  Referring Provider Dr. Chase Norris       Encounter Date: 07/19/2020  Check In:  Session Check In - 07/19/20 1429       Check-In   Supervising physician immediately available to respond to emergencies [facility] immediately available    Physician(s) Dr.Amery    Location [facility]    Staff Present Deanna Langton, RN;Deanna Sartell, MS, ACSM-CEP, CCRP, Exercise Physiologist;Deanna Labrador [name], MS, ACSM-CEP, Exercise Physiologist;Deanna Hassell Done, MS, ACSM-CEP, Exercise Physiologist    Virtual Visit No    Medication changes reported     No    Fall or balance concerns reported    No    Tobacco Cessation No Change    Warm-up and Cool-down Performed as group-led instruction    Resistance Training Performed Yes    VAD Patient? No    PAD/SET Patient? No      Pain Assessment   Currently in Pain? No/denies    Multiple Pain Sites No             Capillary Blood Glucose: No results found for this or any previous visit (from the past 24 hour(s)).    Social History   Tobacco Use  Smoking Status Never  Smokeless Tobacco Never    Goals Met:  Proper associated with RPD/PD & O2 Sat Exercise tolerated well No report of cardiac concerns or symptoms Strength training completed today  Goals Unmet:  Not Applicable  Comments: Service time is from 1317 to 1430    Dr. Fransico Norris is Medical Director for Cardiac Rehab at [facility].

## 2020-07-23 NOTE — Telephone Encounter (Signed)
I called patient today ot f/u on Reclast. She inquired about switching from MTX to azathioprine. TPMP lab is active from 07/13/20 but not resulted.  Routing to rheum clinical team - Please advise if patient needs to have redrawn or if Butch Penny can assist with f/u on this.

## 2020-07-23 NOTE — Telephone Encounter (Signed)
Butch Penny Counselling psychologist) called [facility] to check status of lab and TPMT is still pending and there is currently no ETA on test completion.   Direct phone number for [facility] lab that processes TPMT is [phone]

## 2020-07-24 ENCOUNTER — Encounter (HOSPITAL_COMMUNITY)
Admission: RE | Admit: 2020-07-24 | Discharge: 2020-07-24 | Disposition: A | Discharge status: Home / Self Care | Payer: Medicare Other | Source: Ambulatory Visit | Attending: Internal Medicine | Admitting: Internal Medicine

## 2020-07-24 ENCOUNTER — Other Ambulatory Visit: Payer: Self-pay

## 2020-07-24 DIAGNOSIS — J849 Interstitial pulmonary disease, unspecified: Secondary | ICD-10-CM

## 2020-07-24 LAB — ACID FAST CULTURE WITH REFLEXED SENSITIVITIES (MYCOBACTERIA): Acid Fast Culture: NEGATIVE

## 2020-07-24 LAB — THIOPURINE METHYLTRANSFERASE (TPMT), RBC: Thiopurine Methyltransferase, RBC: 15 nmol/hr/mL RBC

## 2020-07-24 MED ORDER — AZATHIOPRINE 50 MG PO TABS: ORAL_TABLET | ORAL | 0 refills | Status: DC

## 2020-07-24 NOTE — Telephone Encounter (Signed)
Please clarify with Dr. Estanislado Pandy.

## 2020-07-24 NOTE — Progress Notes (Signed)
Pulmonary Individual Treatment Plan  Patient Details  Name: Deanna Norris MRN: [id number] Date of Birth: 02-03-1945 Referring Provider:   [name] Pulmonary Rehab Walk Test from 07/06/2020 in [place]  Referring Provider Dr. [name]       Initial Encounter Date:  Flowsheet Row Pulmonary Rehab Walk Test from 07/06/2020 in [place]  Date 07/06/20       Visit Diagnosis: ILD (interstitial lung disease) ([place])  Patient's Home Medications on Admission:   Current Outpatient Medications:    acetaminophen (TYLENOL) 500 MG tablet, Take 500-1,000 mg by mouth every 8 (eight) hours as needed for moderate pain. (Patient not taking: Reported on 07/13/2020), Disp: , Rfl:    albuterol (PROAIR HFA) 108 (90 Base) MCG/ACT inhaler, Inhale 2 puffs into the lungs every 4 (four) hours as needed for wheezing or shortness of breath., Disp: 18 g, Rfl: 3   Ascorbic Acid (VITAMIN C PO), Take 500 mg by mouth daily., Disp: , Rfl:    aspirin 81 MG tablet, Take 81 mg by mouth daily., Disp: , Rfl:    budesonide (PULMICORT) 180 MCG/ACT inhaler, Inhale 1 puff into the lungs daily as needed (asthma)., Disp: , Rfl:    Calcium Carbonate-Vitamin D (CALCIUM 500/D PO), Take 1 tablet by mouth 2 (two) times daily., Disp: , Rfl:    Cholecalciferol (VITAMIN D3) 2000 UNITS TABS, Take 2,000 Units by mouth daily., Disp: , Rfl:    cycloSPORINE (RESTASIS) 0.05 % ophthalmic emulsion, Place 1 drop into both eyes 2 (two) times daily., Disp: , Rfl:    desonide (DESOWEN) 0.05 % lotion, Apply 1 application topically daily as needed (irritation)., Disp: , Rfl:    diclofenac Sodium (VOLTAREN) 1 % GEL, Apply 2-4 grams to affected joint 4 times daily as needed. (Patient taking differently: Apply 2-4 g topically 4 (four) times daily as needed (pain). Apply 2-4 grams to affected joint 4 times daily as needed.), Disp: 400 g, Rfl: 2   Estradiol (VAGIFEM) 10 MCG TABS vaginal  tablet, Place 1 tablet (10 mcg total) vaginally 2 (two) times a week. Place one tablet (10 mcg) per vagina at bedtime twice a week., Disp: 24 tablet, Rfl: 3   famotidine (PEPCID) 40 MG tablet, Take 1 tablet (40 mg total) by mouth 2 (two) times daily., Disp: 180 tablet, Rfl: 3   fluconazole (DIFLUCAN) 100 MG tablet, Take 1 tablet (100 mg total) by mouth daily. Take 227m starting dose then 1052mdaily for 11 days., Disp: 13 tablet, Rfl: 0   fluconazole (DIFLUCAN) 100 MG tablet, Take 1 tablet (100 mg total) by mouth daily. Take 20046mtarting dose then 100m19mily for 11 days. (Patient not taking: Reported on 07/13/2020), Disp: 13 tablet, Rfl: 0   fluticasone (FLONASE) 50 MCG/ACT nasal spray, USE 2 SPRAYS IN EACH NOSTRIL DAILY AS NEEDED FOR RHINITIS OR ALLERGIES (Patient taking differently: Place 2 sprays into both nostrils daily as needed for allergies. USE 2 SPRAYS IN EACH NOSTRIL DAILY AS NEEDED FOR RHINITIS OR ALLERGIES), Disp: 48 g, Rfl: 3   folic acid (FOLVITE) 1 MG tablet, TAKE 2 TABLETS BY MOUTH DAILY (Patient taking differently: Take 2 mg by mouth daily.), Disp: 180 tablet, Rfl: 3   ibuprofen (ADVIL) 200 MG tablet, Take 200-400 mg by mouth every 8 (eight) hours as needed (pain)., Disp: , Rfl:    Magnesium Malate 1250 (141.7 Mg) MG TABS, Take 3 tablets by mouth daily. , Disp: , Rfl:    methotrexate  50 MG/2ML injection, Inject 0.6 mLs (15 mg total) into the skin once a week., Disp: 8 mL, Rfl: 0   metroNIDAZOLE (METROCREAM) 0.75 % cream, Apply 1 application topically 2 (two) times daily as needed (rosacea)., Disp: , Rfl:    Multiple Vitamin (MULTI-VITAMINS) TABS, Take 1 tablet by mouth daily., Disp: , Rfl:    Omega-3 1000 MG CAPS, Take 1,000 mg by mouth daily., Disp: , Rfl:    ORENCIA 125 MG/ML SOSY, INJECT THE CONTENTS OF 1 SYRINGE UNDER THE SKIN EVERY WEEK, Disp: 4 mL, Rfl: 2   Propylene Glycol (SYSTANE BALANCE) 0.6 % SOLN, Place 1 drop into both eyes daily as needed (dry eyes)., Disp: , Rfl:     Turmeric 500 MG CAPS, Take 500 mg by mouth daily., Disp: , Rfl:    Zinc 50 MG TABS, Take 50 mg by mouth daily., Disp: , Rfl:    zoledronic acid (RECLAST) 5 MG/100ML SOLN injection, Inject 5 mg into the vein once. Takes yearly, Disp: , Rfl:    zolpidem (AMBIEN) 10 MG tablet, Take 0.5 tablets (5 mg total) by mouth at bedtime as needed for sleep., Disp: 30 tablet, Rfl: 2  Past Medical History: Past Medical History:  Diagnosis Date   Asthma    Constipation    Fibroid    GERD (gastroesophageal reflux disease)    Insomnia    Lumbar vertebral fracture (HCC)    L5-S1   Menopausal symptoms    Osteoporosis    Postmenopausal HRT (hormone replacement therapy)    RA (rheumatoid arthritis) ([place]) 11/2004   methotrexate and Embrel   Rosacea    Sjogren's disease ([place])    Sleep apnea 2008   C-Pap   Vitamin D deficiency     Tobacco Use: Social History   Tobacco Use  Smoking Status Never  Smokeless Tobacco Never    Labs: Recent Review Flowsheet Data     Labs for ITP Cardiac and Pulmonary Rehab Latest Ref Rng & Units 03/24/2016 04/01/2017 06/09/2018 06/16/2019 06/18/2020   Cholestrol 0 - 200 mg/dL 196 220(H) 228(H) 216(H) 215(H)   LDLCALC 0 - 99 mg/dL 115(H) 131(H) 134(H) 141(H) 129(H)   LDLDIRECT mg/dL - - - - -   HDL >39.00 mg/dL 64.40 67.30 68.10 59.50 55.60   Trlycerides 0.0 - 149.0 mg/dL 85.0 105.0 133.0 78.0 151.0(H)       Capillary Blood Glucose: No results found for: GLUCAP   Pulmonary Assessment Scores:  Pulmonary Assessment Scores     Row Name 07/06/20 1538         ADL UCSD   ADL Phase Entry           CAT Score     CAT Score 12           mMRC Score     mMRC Score 2            UCSD: Self-administered rating of dyspnea associated with activities of daily living (ADLs) 6-point scale (0 = "not at all" to 5 = "maximal or unable to do because of breathlessness")  Scoring Scores range from 0 to 120.  Minimally important difference is 5 units  CAT: CAT can  identify the health impairment of COPD patients and is better correlated with disease progression.  CAT has a scoring range of zero to 40. The CAT score is classified into four groups of low (less than 10), medium (10 - 20), high (21-30) and very high (31-40) based on the impact level of disease on  health status. A CAT score over 10 suggests significant symptoms.  A worsening CAT score could be explained by an exacerbation, poor medication adherence, poor inhaler technique, or progression of COPD or comorbid conditions.  CAT MCID is 2 points  mMRC: mMRC (Modified Medical Research Council) Dyspnea Scale is used to assess the degree of baseline functional disability in patients of respiratory disease due to dyspnea. No minimal important difference is established. A decrease in score of 1 point or greater is considered a positive change.   Pulmonary Function Assessment:  Pulmonary Function Assessment - 07/06/20 1408       Breath   Shortness of Breath Yes;Limiting activity             Exercise Target Goals: Exercise Program Goal: Individual exercise prescription set using results from initial 6 min walk test and THRR while considering  patient's activity barriers and safety.   Exercise Prescription Goal: Initial exercise prescription builds to 30-45 minutes a day of aerobic activity, 2-3 days per week.  Home exercise guidelines will be given to patient during program as part of exercise prescription that the participant will acknowledge.  Activity Barriers & Risk Stratification:  Activity Barriers & Cardiac Risk Stratification - 07/06/20 1405       Activity Barriers & Cardiac Risk Stratification   Activity Barriers Arthritis;Shortness of Breath;Back Problems;Joint Problems;Deconditioning   Reumatoid Arthritis            6 Minute Walk:  6 Minute Walk     Row Name 07/06/20 1524         6 Minute Walk   Phase Initial     Distance 1447 feet     Walk Time 6 minutes     # of  Rest Breaks 0     MPH 2.74     METS 2.77     RPE 11     Perceived Dyspnea  2     VO2 Peak 9.68     Symptoms No     Resting HR 59 bpm     Resting BP 124/88     Resting Oxygen Saturation  97 %     Exercise Oxygen Saturation  during 6 min walk 94 %     Max Ex. HR 83 bpm     Max Ex. BP 138/78     2 Minute Post BP 124/70           Interval HR     1 Minute HR 75     2 Minute HR 80     3 Minute HR 87     4 Minute HR 83     5 Minute HR 83     6 Minute HR 83     2 Minute Post HR 50     Interval Heart Rate? Yes           Interval Oxygen     Interval Oxygen? Yes     Baseline Oxygen Saturation % 97 %     1 Minute Oxygen Saturation % 95 %     1 Minute Liters of Oxygen 0 L     2 Minute Oxygen Saturation % 94 %     2 Minute Liters of Oxygen 0 L     3 Minute Oxygen Saturation % 94 %     3 Minute Liters of Oxygen 0 L     4 Minute Oxygen Saturation % 95 %     4 Minute Liters of Oxygen 0 L  5 Minute Oxygen Saturation % 94 %     5 Minute Liters of Oxygen 0 L     6 Minute Oxygen Saturation % 94 %     6 Minute Liters of Oxygen 0 L     2 Minute Post Oxygen Saturation % 97 %     2 Minute Post Liters of Oxygen 0 L             Oxygen Initial Assessment:  Oxygen Initial Assessment - 07/06/20 1407       Home Oxygen   Home Oxygen Device None    Sleep Oxygen Prescription CPAP    Home Exercise Oxygen Prescription None    Home Resting Oxygen Prescription None    Compliance with Home Oxygen Use Yes      Initial 6 min Walk   Oxygen Used None      Program Oxygen Prescription   Program Oxygen Prescription None      Intervention   Short Term Goals To learn and understand importance of monitoring SPO2 with pulse oximeter and demonstrate accurate use of the pulse oximeter.;To learn and exhibit compliance with exercise, home and travel O2 prescription;To learn and understand importance of maintaining oxygen saturations>88%;To learn and demonstrate proper pursed lip breathing techniques  or other breathing techniques. ;To learn and demonstrate proper use of respiratory medications    Long  Term Goals Exhibits compliance with exercise, home  and travel O2 prescription;Verbalizes importance of monitoring SPO2 with pulse oximeter and return demonstration;Maintenance of O2 saturations>88%;Exhibits proper breathing techniques, such as pursed lip breathing or other method taught during program session;Compliance with respiratory medication;Demonstrates proper use of MDI's             Oxygen Re-Evaluation:  Oxygen Re-Evaluation     Row Name 07/23/20 1621             Program Oxygen Prescription   Program Oxygen Prescription None               Home Oxygen     Home Oxygen Device None       Sleep Oxygen Prescription CPAP       Home Exercise Oxygen Prescription None       Home Resting Oxygen Prescription None       Compliance with Home Oxygen Use Yes               Goals/Expected Outcomes     Short Term Goals To learn and understand importance of monitoring SPO2 with pulse oximeter and demonstrate accurate use of the pulse oximeter.;To learn and exhibit compliance with exercise, home and travel O2 prescription;To learn and understand importance of maintaining oxygen saturations>88%;To learn and demonstrate proper pursed lip breathing techniques or other breathing techniques. ;To learn and demonstrate proper use of respiratory medications       Long  Term Goals Exhibits compliance with exercise, home  and travel O2 prescription;Verbalizes importance of monitoring SPO2 with pulse oximeter and return demonstration;Maintenance of O2 saturations>88%;Exhibits proper breathing techniques, such as pursed lip breathing or other method taught during program session;Compliance with respiratory medication;Demonstrates proper use of MDI's       Goals/Expected Outcomes compliance and understanding of monitoring oxygen saturation at home and understanding importance of pursed lip breathing.                Oxygen Discharge (Final Oxygen Re-Evaluation):  Oxygen Re-Evaluation - 07/23/20 1621       Program Oxygen Prescription   Program Oxygen Prescription None  Home Oxygen   Home Oxygen Device None    Sleep Oxygen Prescription CPAP    Home Exercise Oxygen Prescription None    Home Resting Oxygen Prescription None    Compliance with Home Oxygen Use Yes      Goals/Expected Outcomes   Short Term Goals To learn and understand importance of monitoring SPO2 with pulse oximeter and demonstrate accurate use of the pulse oximeter.;To learn and exhibit compliance with exercise, home and travel O2 prescription;To learn and understand importance of maintaining oxygen saturations>88%;To learn and demonstrate proper pursed lip breathing techniques or other breathing techniques. ;To learn and demonstrate proper use of respiratory medications    Long  Term Goals Exhibits compliance with exercise, home  and travel O2 prescription;Verbalizes importance of monitoring SPO2 with pulse oximeter and return demonstration;Maintenance of O2 saturations>88%;Exhibits proper breathing techniques, such as pursed lip breathing or other method taught during program session;Compliance with respiratory medication;Demonstrates proper use of MDI's    Goals/Expected Outcomes compliance and understanding of monitoring oxygen saturation at home and understanding importance of pursed lip breathing.             Initial Exercise Prescription:  Initial Exercise Prescription - 07/06/20 1500       Date of Initial Exercise RX and Referring Provider   Date 07/06/20    Referring Provider Dr. [name]    Expected Discharge Date 09/06/20      NuStep   Level 2    SPM 70    Minutes 15      Track   Minutes 15      Prescription Details   Frequency (times per week) 2    Duration Progress to 30 minutes of continuous aerobic without signs/symptoms of physical distress      Intensity   THRR 40-80% of Max  Heartrate 58-117    Ratings of Perceived Exertion 11-13    Perceived Dyspnea 0-4      Progression   Progression Continue to progress workloads to maintain intensity without signs/symptoms of physical distress.      Resistance Training   Training Prescription Yes    Weight Red bands    Reps 10-15             Perform Capillary Blood Glucose checks as needed.  Exercise Prescription Changes:   Exercise Prescription Changes     Row Name 07/17/20 1500             Response to Exercise   Blood Pressure (Admit) 133/70       Blood Pressure (Exercise) 138/78       Blood Pressure (Exit) 110/58       Heart Rate (Admit) 65 bpm       Heart Rate (Exercise) 79 bpm       Heart Rate (Exit) 73 bpm       Oxygen Saturation (Admit) 94 %       Oxygen Saturation (Exercise) 95 %       Oxygen Saturation (Exit) 97 %       Rating of Perceived Exertion (Exercise) 9       Perceived Dyspnea (Exercise) 1       Duration Continue with 30 min of aerobic exercise without signs/symptoms of physical distress.       Intensity Other (comment)  40-80% of HRR               Progression     Progression Continue to progress workloads to maintain intensity without signs/symptoms of physical distress.  Resistance Training     Training Prescription Yes       Weight Red bands       Reps 10-15       Time 10 Minutes               NuStep     Level 2       SPM 70       Minutes 15       METs 2               Track     Laps 21       Minutes 15               Exercise Comments:   Exercise Comments     Row Name 07/10/20 1517           Exercise Comments Pt completed first day of exercise and tolerated well with no complaints or concerns. She was able to do 15 minutes walking the track and 15 minutes on the Nustep with no rest breaks. She also completed resistance training with no issues. Will continue to monitor.                Exercise Goals and Review:   Exercise Goals      Row Name 07/06/20 1523             Exercise Goals   Increase Physical Activity Yes       Intervention Provide advice, education, support and counseling about physical activity/exercise needs.;Develop an individualized exercise prescription for aerobic and resistive training based on initial evaluation findings, risk stratification, comorbidities and participant's personal goals.       Expected Outcomes Short Term: Attend rehab on a regular basis to increase amount of physical activity.;Long Term: Add in home exercise to make exercise part of routine and to increase amount of physical activity.;Long Term: Exercising regularly at least 3-5 days a week.       Increase Strength and Stamina Yes       Intervention Provide advice, education, support and counseling about physical activity/exercise needs.;Develop an individualized exercise prescription for aerobic and resistive training based on initial evaluation findings, risk stratification, comorbidities and participant's personal goals.       Expected Outcomes Short Term: Increase workloads from initial exercise prescription for resistance, speed, and METs.;Short Term: Perform resistance training exercises routinely during rehab and add in resistance training at home;Long Term: Improve cardiorespiratory fitness, muscular endurance and strength as measured by increased METs and functional capacity (6MWT)       Able to understand and use rate of perceived exertion (RPE) scale Yes       Intervention Provide education and explanation on how to use RPE scale       Expected Outcomes Short Term: Able to use RPE daily in rehab to express subjective intensity level;Long Term:  Able to use RPE to guide intensity level when exercising independently       Able to understand and use Dyspnea scale Yes       Intervention Provide education and explanation on how to use Dyspnea scale       Expected Outcomes Short Term: Able to use Dyspnea scale daily in rehab to  express subjective sense of shortness of breath during exertion;Long Term: Able to use Dyspnea scale to guide intensity level when exercising independently       Knowledge and understanding of Target Heart Rate Range (THRR) Yes  Intervention Provide education and explanation of THRR including how the numbers were predicted and where they are located for reference       Expected Outcomes Short Term: Able to state/look up THRR;Short Term: Able to use daily as guideline for intensity in rehab;Long Term: Able to use THRR to govern intensity when exercising independently       Understanding of Exercise Prescription Yes       Intervention Provide education, explanation, and written materials on patient's individual exercise prescription       Expected Outcomes Short Term: Able to explain program exercise prescription;Long Term: Able to explain home exercise prescription to exercise independently                Exercise Goals Re-Evaluation :  Exercise Goals Re-Evaluation     Row Name 07/23/20 1617             Exercise Goal Re-Evaluation   Exercise Goals Review Increase Physical Activity;Increase Strength and Stamina;Able to understand and use rate of perceived exertion (RPE) scale;Able to understand and use Dyspnea scale;Knowledge and understanding of Target Heart Rate Range (THRR);Understanding of Exercise Prescription       Comments [name] has completed 4 exercise sessions and has tolerated well so far. She has already made workload increases in the short time that she has been in the program. She is exercising at 2.4 METS on the Nustep and is walking 21 laps in 15 minutes. Will continue to monitor and progress as she is able.       Expected Outcomes Through exercise at rehab and home, the patient will decrease shortness of breath with daily activities and feel confident in carrying out an exercise regimn at home.                Discharge Exercise Prescription (Final Exercise  Prescription Changes):  Exercise Prescription Changes - 07/17/20 1500       Response to Exercise   Blood Pressure (Admit) 133/70    Blood Pressure (Exercise) 138/78    Blood Pressure (Exit) 110/58    Heart Rate (Admit) 65 bpm    Heart Rate (Exercise) 79 bpm    Heart Rate (Exit) 73 bpm    Oxygen Saturation (Admit) 94 %    Oxygen Saturation (Exercise) 95 %    Oxygen Saturation (Exit) 97 %    Rating of Perceived Exertion (Exercise) 9    Perceived Dyspnea (Exercise) 1    Duration Continue with 30 min of aerobic exercise without signs/symptoms of physical distress.    Intensity Other (comment)   40-80% of HRR     Progression   Progression Continue to progress workloads to maintain intensity without signs/symptoms of physical distress.      Resistance Training   Training Prescription Yes    Weight Red bands    Reps 10-15    Time 10 Minutes      NuStep   Level 2    SPM 70    Minutes 15    METs 2      Track   Laps 21    Minutes 15             Nutrition:  Target Goals: Understanding of nutrition guidelines, daily intake of sodium <1574m, cholesterol <2026m calories 30% from fat and 7% or less from saturated fats, daily to have 5 or more servings of fruits and vegetables.  Biometrics:  Pre Biometrics - 07/06/20 1524       Pre Biometrics  Grip Strength 18 kg              Nutrition Therapy Plan and Nutrition Goals:  Nutrition Therapy & Goals - 07/17/20 1457       Nutrition Therapy   Diet General Healthful      Personal Nutrition Goals   Nutrition Goal Pt to identify food quantities necessary to achieve weight loss of 6-24 lb at graduation from cardiac rehab.    Personal Goal #2 Pt to build a healthy plate including vegetables, fruits, whole grains, and low-fat dairy products in a heart healthy meal plan.      Intervention Plan   Intervention Prescribe, educate and counsel regarding individualized specific dietary modifications aiming towards targeted  core components such as weight, hypertension, lipid management, diabetes, heart failure and other comorbidities.;Nutrition handout(s) given to patient.    Expected Outcomes Short Term Goal: A plan has been developed with personal nutrition goals set during dietitian appointment.;Long Term Goal: Adherence to prescribed nutrition plan.             Nutrition Assessments:  MEDIFICTS Score Key: ?70 Need to make dietary changes  40-70 Heart Healthy Diet ? 40 Therapeutic Level Cholesterol Diet  Flowsheet Row PULMONARY REHAB OTHER RESPIRATORY from 07/17/2020 in [place]  Picture Your Plate Total Score on Admission 68      Picture Your Plate Scores: <29 Unhealthy dietary pattern with much room for improvement. 41-50 Dietary pattern unlikely to meet recommendations for good health and room for improvement. 51-60 More healthful dietary pattern, with some room for improvement.  >60 Healthy dietary pattern, although there may be some specific behaviors that could be improved.    Nutrition Goals Re-Evaluation:  Nutrition Goals Re-Evaluation     [place] Name 07/17/20 1458             Goals   Current Weight 176 lb (79.8 kg)       Nutrition Goal Pt to identify food quantities necessary to achieve weight loss of 6-24 lb at graduation from cardiac rehab.               Personal Goal #2 Re-Evaluation     Personal Goal #2 Pt to build a healthy plate including vegetables, fruits, whole grains, and low-fat dairy products in a heart healthy meal plan.               Nutrition Goals Discharge (Final Nutrition Goals Re-Evaluation):  Nutrition Goals Re-Evaluation - 07/17/20 1458       Goals   Current Weight 176 lb (79.8 kg)    Nutrition Goal Pt to identify food quantities necessary to achieve weight loss of 6-24 lb at graduation from cardiac rehab.      Personal Goal #2 Re-Evaluation   Personal Goal #2 Pt to build a healthy plate including vegetables,  fruits, whole grains, and low-fat dairy products in a heart healthy meal plan.             Psychosocial: Target Goals: Acknowledge presence or absence of significant depression and/or stress, maximize coping skills, provide positive support system. Participant is able to verbalize types and ability to use techniques and skills needed for reducing stress and depression.  Initial Review & Psychosocial Screening:  Initial Psych Review & Screening - 07/06/20 1408       Initial Review   Current issues with None Identified      Family Dynamics   Good Support System? Yes   Husband, friends and family  Barriers   Psychosocial barriers to participate in program The patient should benefit from training in stress management and relaxation.      Screening Interventions   Interventions Encouraged to exercise    Expected Outcomes Long Term Goal: Stressors or current issues are controlled or eliminated.;Short Term goal: Identification and review with participant of any Quality of Life or Depression concerns found by scoring the questionnaire.;Long Term goal: The participant improves quality of Life and PHQ9 Scores as seen by post scores and/or verbalization of changes             Quality of Life Scores:  Scores of 19 and below usually indicate a poorer quality of life in these areas.  A difference of  2-3 points is a clinically meaningful difference.  A difference of 2-3 points in the total score of the Quality of Life Index has been associated with significant improvement in overall quality of life, self-image, physical symptoms, and general health in studies assessing change in quality of life.  PHQ-9: Recent Review Flowsheet Data     Depression screen [facility] 2/9 07/06/2020 07/02/2020 06/16/2019 04/08/2018 04/01/2017   Decreased Interest 0 0 0 0 0   Down, Depressed, Hopeless 0 0 0 0 0   PHQ - 2 Score 0 0 0 0 0   Altered sleeping 0 1 0 0 0   Tired, decreased energy 2 2 0 0 0   Change in  appetite 0 1 0 0 0   Feeling bad or failure about yourself  0 0 0 0 0   Trouble concentrating 0 0 0 0 0   Moving slowly or fidgety/restless - 0 0 0 0   Suicidal thoughts 0 0 0 0 0   PHQ-9 Score 2 4 0 0 0   Difficult doing work/chores - - Not difficult at all Not difficult at all Not difficult at all      Interpretation of Total Score  Total Score Depression Severity:  1-4 = Minimal depression, 5-9 = Mild depression, 10-14 = Moderate depression, 15-19 = Moderately severe depression, 20-27 = Severe depression   Psychosocial Evaluation and Intervention:  Psychosocial Evaluation - 07/06/20 1530       Psychosocial Evaluation & Interventions   Interventions Encouraged to exercise with the program and follow exercise prescription    Comments Patient does not show any psychosocial barriers to exercise and seems to have a positive outlook.    Expected Outcomes For pt to remain free of psychosocial barriers throughout rehab program and for pt to have continue mental well being    Continue Psychosocial Services  No Follow up required             Psychosocial Re-Evaluation:  Psychosocial Re-Evaluation     Export Name 07/23/20 1339             Psychosocial Re-Evaluation   Current issues with None Identified       Comments No psychosocial concerns identified at this time with a positive attitude.       Expected Outcomes For [name] to continue to be free of psychosocial concerns while participating in pulmonary rehab.       Interventions Encouraged to attend Pulmonary Rehabilitation for the exercise       Continue Psychosocial Services  No Follow up required                Psychosocial Discharge (Final Psychosocial Re-Evaluation):  Psychosocial Re-Evaluation - 07/23/20 1339       Psychosocial  Re-Evaluation   Current issues with None Identified    Comments No psychosocial concerns identified at this time with a positive attitude.    Expected Outcomes For [name] to continue  to be free of psychosocial concerns while participating in pulmonary rehab.    Interventions Encouraged to attend Pulmonary Rehabilitation for the exercise    Continue Psychosocial Services  No Follow up required             Education: Education Goals: Education classes will be provided on a weekly basis, covering required topics. Participant will state understanding/return demonstration of topics presented.  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 Instructor discusses the definition of a risk factor, different risk factors for pulmonary disease, and how the heart and lungs work together.     Nutrition for Pulmonary Patient:  -Group instruction provided by PowerPoint slides, verbal discussion, and written materials to support subject matter. The instructor gives an explanation and review of healthy diet recommendations, which includes a discussion on weight management, recommendations for fruit and vegetable consumption, as well as protein, fluid, caffeine, fiber, sodium, sugar, and alcohol. Tips for eating when patients are short of breath are discussed.   Pursed Lip Breathing:  -Group instruction that is supported by demonstration and informational handouts. Instructor discusses the benefits of pursed lip and diaphragmatic breathing and detailed demonstration on how to preform both.     Oxygen Safety:  -Group instruction provided by PowerPoint, verbal discussion, and written material to support subject matter. There is an overview of "What is Oxygen" and "Why do we need it".  Instructor also  reviews how to create a safe environment for oxygen use, the importance of using oxygen as prescribed, and the risks of noncompliance. There is a brief discussion on traveling with oxygen and resources the patient may utilize.   Oxygen Equipment:  -Group instruction provided by [facility] Staff utilizing handouts, written materials, and equipment demonstrations.   Signs and Symptoms:  -Group instruction provided by written material and verbal discussion to support subject matter. Warning signs and symptoms of infection, stroke, and heart attack are reviewed and when to call the physician/911 reinforced. Tips for preventing the spread of infection discussed.   Advanced Directives:  -Group instruction provided by verbal instruction and written material to support subject matter. Instructor reviews Advanced Directive laws and proper instruction for filling out document.   Pulmonary Video:  -Group video education that reviews the importance of medication and oxygen compliance, exercise, good nutrition, pulmonary hygiene, and pursed lip and diaphragmatic breathing for the pulmonary patient.   Exercise for the Pulmonary Patient:  -Group instruction that is supported by a PowerPoint presentation. Instructor discusses benefits of exercise, core components of exercise, frequency, duration, and intensity of an exercise routine, importance of utilizing pulse oximetry during exercise, safety while exercising, and options of places to exercise outside of rehab.   Flowsheet Row PULMONARY REHAB OTHER RESPIRATORY from 07/19/2020 in [place]  Date 07/19/20  Educator handout       Pulmonary Medications:  -Verbally interactive group education provided by instructor with focus on inhaled medications and proper administration.  Anatomy and Physiology of the Respiratory System and Intimacy:  -Group instruction provided by PowerPoint, verbal discussion, and written  material to support subject matter. Instructor reviews respiratory cycle and anatomical components of the respiratory system and their functions. Instructor also reviews differences in obstructive and restrictive respiratory diseases with examples of each. Intimacy, Sex, and Sexuality differences are reviewed with a discussion on how relationships can change when diagnosed with pulmonary disease. Common sexual concerns are reviewed.   MD DAY -A group question and answer session with a medical doctor that allows participants to ask questions that relate to their pulmonary disease state.   OTHER EDUCATION -Group or individual verbal, written, or video instructions that support the educational goals of the pulmonary rehab program. [place] from 07/19/2020 in [place]  Date 07/12/20  [facility]]  Educator handout       Holiday Eating Survival Tips:  -Group instruction provided by PowerPoint slides, verbal discussion, and written materials to support subject matter. The instructor gives patients tips, tricks, and techniques to help them not only survive but enjoy the holidays despite the onslaught of food that accompanies the holidays.   Knowledge Questionnaire Score:  Knowledge Questionnaire Score - 07/06/20 1537       Knowledge Questionnaire Score   Pre Score 14/18             Core Components/Risk Factors/Patient Goals at Admission:  Personal Goals and Risk Factors at Admission - 07/06/20 1534       Core Components/Risk Factors/Patient Goals on Admission   Expected Outcomes Long Term: Adherence to nutrition and physical activity/exercise program aimed toward attainment of established weight goal;Weight Loss: Understanding of general recommendations for a balanced deficit meal plan, which promotes 1-2 lb weight loss per week and includes a negative energy balance of [phone] kcal/d;Short Term: Continue  to assess and modify interventions until short term weight is achieved;Understanding of distribution of calorie intake throughout the day with the consumption of 4-5 meals/snacks    Improve shortness of breath with ADL's Yes    Intervention Provide education, individualized exercise plan and daily activity instruction to help decrease symptoms of SOB with activities of daily living.    Expected Outcomes Short Term: Improve cardiorespiratory fitness to achieve a reduction of symptoms when performing ADLs;Long Term: Be able to perform more ADLs without symptoms or delay the onset of symptoms             Core Components/Risk Factors/Patient Goals Review:   Goals and Risk Factor Review     Row Name 07/23/20 1340             Core Components/Risk Factors/Patient Goals Review   Personal Goals Review Develop more efficient breathing techniques such as purse lipped breathing and diaphragmatic breathing and practicing self-pacing with activity.;Increase knowledge of respiratory medications and ability to use respiratory devices properly.;Improve shortness of breath with ADL's       Review [name] is high functioning for a pulmonary rehab patient.  She has attended 2 weeks of exercise classes and is already walking almost a mile in 15 minutes and 2.4 mets on the nustep.  She is also learning safe and appropriate guideline for exercise.       Expected Outcomes See admission goals.                Core Components/Risk Factors/Patient Goals at Discharge (Final Review):   Goals and Risk Factor Review - 07/23/20 1340  Core Components/Risk Factors/Patient Goals Review   Personal Goals Review Develop more efficient breathing techniques such as purse lipped breathing and diaphragmatic breathing and practicing self-pacing with activity.;Increase knowledge of respiratory medications and ability to use respiratory devices properly.;Improve shortness of breath with ADL's    Review [name] is high  functioning for a pulmonary rehab patient.  She has attended 2 weeks of exercise classes and is already walking almost a mile in 15 minutes and 2.4 mets on the nustep.  She is also learning safe and appropriate guideline for exercise.    Expected Outcomes See admission goals.             ITP Comments:   Comments:  [place] has completed 4 exercise session in Pulmonary rehab. Pt maintains good attendance. Pulmonary rehab staff will continue to monitor and reassess progress toward goals during her participation in Pulmonary Rehab.

## 2020-07-24 NOTE — Progress Notes (Signed)
Daily Session Note  Patient Details  Name: Deanna Norris MRN: [id number] Date of Birth: July 01, 1945 Referring Provider:   [name] Pulmonary Rehab Walk Test from 07/06/2020 in [place]  Referring Provider Dr. [name]       Encounter Date: 07/24/2020  Check In:  Session Check In - 07/24/20 1459       Check-In   Supervising physician immediately available to respond to emergencies [facility] immediately available    Physician(s) Dr. Sabino Gasser    Location [facility]    Staff Present [name], RN, [name], MS, ACSM-CEP, Exercise Physiologist;[name] [name], RN    Virtual Visit No    Medication changes reported     No    Fall or balance concerns reported    No    Tobacco Cessation No Change    Warm-up and Cool-down Performed as group-led instruction    Resistance Training Performed Yes    VAD Patient? No    PAD/SET Patient? No      Pain Assessment   Currently in Pain? No/denies    Multiple Pain Sites No             Capillary Blood Glucose: No results found for this or any previous visit (from the past 24 hour(s)).    Social History   Tobacco Use  Smoking Status Never  Smokeless Tobacco Never    Goals Met:  Exercise tolerated well No report of cardiac concerns or symptoms Strength training completed today  Goals Unmet:  Not Applicable  Comments: Service time is from 1315 to 1435    Dr. [name] is Medical Director for Cardiac Rehab at [facility].

## 2020-07-24 NOTE — Telephone Encounter (Signed)
TPMT WNL.  Ok to start on Imuran as discussed with Dr. Estanislado Pandy at her last visit.  She will discontinue MTX as discussed.   Per office note on 07/13/2020:  We will discontinue methotrexate and start her on Imuran at 50 mg p.o. daily for 2 weeks and then increase to 200 mg p.o. daily.

## 2020-07-24 NOTE — Progress Notes (Signed)
TPMT WNL.  Ok to start on Imuran as discussed with Dr. Estanislado Pandy at her last visit.  She will discontinue MTX as discussed.

## 2020-07-26 ENCOUNTER — Other Ambulatory Visit: Payer: Self-pay

## 2020-07-26 ENCOUNTER — Encounter (HOSPITAL_COMMUNITY)
Admission: RE | Admit: 2020-07-26 | Discharge: 2020-07-26 | Disposition: A | Discharge status: Home / Self Care | Payer: Medicare Other | Source: Ambulatory Visit | Attending: Internal Medicine | Admitting: Internal Medicine

## 2020-07-26 VITALS — Wt 180.3 lb

## 2020-07-26 DIAGNOSIS — J849 Interstitial pulmonary disease, unspecified: Secondary | ICD-10-CM | POA: Diagnosis not present

## 2020-07-26 NOTE — Progress Notes (Signed)
Daily Session Note  Patient Details  Name: Deanna Norris MRN: [id number] Date of Birth: 1945-07-18 Referring Provider:   [name] Pulmonary Rehab Walk Test from 07/06/2020 in [place]  Referring Provider Dr. [name]       Encounter Date: 07/26/2020  Check In:  Session Check In - 07/26/20 1408       Check-In   Supervising physician immediately available to respond to emergencies [facility] immediately available    Physician(s) Dr. Sabino Gasser    Location [facility]    Staff Present [name], RN, BSN;Tika Hannis [name], Cathleen Fears, MS, ACSM-CEP, Exercise Physiologist;Jessica Hassell Done, MS, ACSM-CEP, Exercise Physiologist    Virtual Visit No    Medication changes reported     No    Fall or balance concerns reported    No    Tobacco Cessation No Change    Warm-up and Cool-down Performed as group-led instruction    Resistance Training Performed Yes    VAD Patient? No    PAD/SET Patient? No      Pain Assessment   Currently in Pain? No/denies    Pain Score 0-No pain    Multiple Pain Sites No             Capillary Blood Glucose: No results found for this or any previous visit (from the past 24 hour(s)).    Social History   Tobacco Use  Smoking Status Never  Smokeless Tobacco Never    Goals Met:  Exercise tolerated well No report of cardiac concerns or symptoms Strength training completed today  Goals Unmet:  Not Applicable  Comments: Service time is from 1318 to 1430

## 2020-07-31 ENCOUNTER — Encounter (HOSPITAL_COMMUNITY): Payer: Medicare Other

## 2020-08-02 ENCOUNTER — Encounter (HOSPITAL_COMMUNITY)
Admission: RE | Admit: 2020-08-02 | Discharge: 2020-08-02 | Disposition: A | Discharge status: Home / Self Care | Payer: Medicare Other | Source: Ambulatory Visit | Attending: Internal Medicine | Admitting: Internal Medicine

## 2020-08-02 ENCOUNTER — Other Ambulatory Visit: Payer: Self-pay

## 2020-08-02 DIAGNOSIS — J849 Interstitial pulmonary disease, unspecified: Secondary | ICD-10-CM

## 2020-08-02 NOTE — Progress Notes (Signed)
Daily Session Note  Patient Details  Name: Deanna Norris MRN: [id number] Date of Birth: Nov 19, 1945 Referring Provider:   [name] Pulmonary Rehab Walk Test from 07/06/2020 in [place]  Referring Provider Dr. [name]       Encounter Date: 08/02/2020  Check In:  Session Check In - 08/02/20 1427       Check-In   Supervising physician immediately available to respond to emergencies [facility] immediately available    Physician(s) Dr. Karleen Hampshire    [facility]    Staff Present [name], RN, Quentin Ore, MS, ACSM-CEP, Exercise Physiologist;Lisa [name], RN;Jessica Hassell Done, MS, ACSM-CEP, Exercise Physiologist    Virtual Visit No    Medication changes reported     No    Fall or balance concerns reported    No    Tobacco Cessation No Change    Warm-up and Cool-down Performed as group-led instruction    Resistance Training Performed Yes    VAD Patient? No    PAD/SET Patient? No      Pain Assessment   Currently in Pain? No/denies    Multiple Pain Sites No             Capillary Blood Glucose: No results found for this or any previous visit (from the past 24 hour(s)).    Social History   Tobacco Use  Smoking Status Never  Smokeless Tobacco Never    Goals Met:  Proper associated with RPD/PD & O2 Sat Exercise tolerated well No report of cardiac concerns or symptoms Strength training completed today  Goals Unmet:  Not Applicable  Comments: Service time is from 1316 to 29    Dr. [name] is Medical Director for Cardiac Rehab at [facility].

## 2020-08-07 ENCOUNTER — Encounter (HOSPITAL_COMMUNITY): Payer: Medicare Other

## 2020-08-07 ENCOUNTER — Other Ambulatory Visit (HOSPITAL_COMMUNITY): Payer: Self-pay | Admitting: *Deleted

## 2020-08-08 ENCOUNTER — Other Ambulatory Visit: Payer: Self-pay

## 2020-08-08 ENCOUNTER — Ambulatory Visit (HOSPITAL_COMMUNITY)
Admission: RE | Admit: 2020-08-08 | Discharge: 2020-08-08 | Disposition: A | Discharge status: Home / Self Care | Payer: Medicare Other | Source: Ambulatory Visit | Attending: Rheumatology | Admitting: Rheumatology

## 2020-08-08 DIAGNOSIS — M810 Age-related osteoporosis without current pathological fracture: Secondary | ICD-10-CM | POA: Diagnosis not present

## 2020-08-08 LAB — CREATININE, SERUM
Creatinine, Ser: 0.64 mg/dL (ref 0.44–1.00)
GFR, Estimated: >60 mL/min (ref >60)

## 2020-08-08 LAB — CALCIUM: Calcium: 9.1 mg/dL (ref 8.9–10.3)

## 2020-08-08 MED ORDER — DIPHENHYDRAMINE HCL 25 MG PO CAPS: 25.0000 mg | ORAL_CAPSULE | Freq: Once | ORAL | Status: DC

## 2020-08-08 MED ORDER — ZOLEDRONIC ACID 5 MG/100ML IV SOLN
INTRAVENOUS | Status: AC
  Administered 2020-08-08: 5 mg via INTRAVENOUS
  Filled 2020-08-08: qty 100

## 2020-08-08 MED ORDER — ZOLEDRONIC ACID 5 MG/100ML IV SOLN: 5.0000 mg | Freq: Once | INTRAVENOUS | Status: AC

## 2020-08-08 MED ORDER — SODIUM CHLORIDE 0.9 % IV SOLN
INTRAVENOUS | Status: DC
Start: 2020-08-08 — End: 2020-08-09

## 2020-08-08 MED ORDER — ACETAMINOPHEN 325 MG PO TABS
650.0000 mg | ORAL_TABLET | Freq: Once | ORAL | Status: AC
  Administered 2020-08-08: 650 mg via ORAL

## 2020-08-08 MED ORDER — ACETAMINOPHEN 325 MG PO TABS
ORAL_TABLET | ORAL | Status: AC
  Filled 2020-08-08: qty 2

## 2020-08-09 ENCOUNTER — Other Ambulatory Visit: Payer: Self-pay

## 2020-08-09 ENCOUNTER — Encounter (HOSPITAL_COMMUNITY)
Admission: RE | Admit: 2020-08-09 | Discharge: 2020-08-09 | Disposition: A | Discharge status: Home / Self Care | Payer: Medicare Other | Source: Ambulatory Visit | Attending: Internal Medicine | Admitting: Internal Medicine

## 2020-08-09 DIAGNOSIS — J849 Interstitial pulmonary disease, unspecified: Secondary | ICD-10-CM | POA: Insufficient documentation

## 2020-08-09 NOTE — Progress Notes (Signed)
Daily Session Note  Patient Details  Name: RASHEMA SEAWRIGHT MRN: [id number] Date of Birth: [birth year] Referring Provider:   [name] Pulmonary Rehab Walk Test from 07/06/2020 in [place]  Referring Provider Dr. [name]       Encounter Date: 08/09/2020  Check In:  Session Check In - 08/09/20 1425       Check-In   Supervising physician immediately available to respond to emergencies [facility] immediately available    Physician(s) Dr. Broadus John    [facility]    Staff Present [name], RN, BSN;Carlette Carlton, RN, BSN;Latresha Yahr [name], RN;David Como, MS, ACSM-CEP, CCRP, Exercise Physiologist;Annedrea Rosezella [place], RN, MHA    Virtual Visit No    Medication changes reported     No    Fall or balance concerns reported    No    Tobacco Cessation No Change    Warm-up and Cool-down Performed as group-led instruction    Resistance Training Performed Yes    VAD Patient? No    PAD/SET Patient? No      Pain Assessment   Currently in Pain? No/denies    Multiple Pain Sites No             Capillary Blood Glucose: No results found for this or any previous visit (from the past 24 hour(s)).    Social History   Tobacco Use  Smoking Status Never  Smokeless Tobacco Never    Goals Met:  Exercise tolerated well No report of cardiac concerns or symptoms Strength training completed today  Goals Unmet:  Not Applicable  Comments: Service time is from 1322 to 1430    Dr. [name] is Medical Director for Cardiac Rehab at [facility].

## 2020-08-13 ENCOUNTER — Telehealth: Payer: Self-pay

## 2020-08-13 ENCOUNTER — Other Ambulatory Visit: Payer: Self-pay

## 2020-08-13 DIAGNOSIS — Z79899 Other long term (current) drug therapy: Secondary | ICD-10-CM | POA: Diagnosis not present

## 2020-08-13 LAB — COMPLETE METABOLIC PANEL WITH GFR
AG Ratio: 1.7 (calc) (ref 1.0–2.5)
ALT: 22 U/L (ref 6–29)
AST: 24 U/L (ref 10–35)
Albumin: 3.8 g/dL (ref 3.6–5.1)
Alkaline phosphatase (APISO): 72 U/L (ref 37–153)
BUN: 14 mg/dL (ref 7–25)
CO2: 28 mmol/L (ref 20–32)
Calcium: 9.6 mg/dL (ref 8.6–10.4)
Chloride: 104 mmol/L (ref 98–110)
Creat: 0.65 mg/dL (ref 0.60–1.00)
Globulin: 2.3 g/dL (calc) (ref 1.9–3.7)
Glucose, Bld: 93 mg/dL (ref 65–99)
Potassium: 4.3 mmol/L (ref 3.5–5.3)
Sodium: 139 mmol/L (ref 135–146)
Total Bilirubin: 0.5 mg/dL (ref 0.2–1.2)
Total Protein: 6.1 g/dL (ref 6.1–8.1)
eGFR: 92 mL/min/{1.73_m2} (ref >=60)

## 2020-08-13 LAB — CBC WITH DIFFERENTIAL/PLATELET
Absolute Monocytes: 763 cells/uL (ref 200–950)
Basophils Absolute: 102 cells/uL (ref 0–200)
Basophils Relative: 1.1 %
Eosinophils Absolute: 967 cells/uL — ABNORMAL HIGH (ref 15–500)
Eosinophils Relative: 10.4 %
HCT: 46.7 % — ABNORMAL HIGH (ref 35.0–45.0)
Hemoglobin: 15.7 g/dL — ABNORMAL HIGH (ref 11.7–15.5)
Lymphs Abs: 2818 cells/uL (ref 850–3900)
MCH: 32.6 pg (ref 27.0–33.0)
MCHC: 33.6 g/dL (ref 32.0–36.0)
MCV: 96.9 fL (ref 80.0–100.0)
MPV: 10.9 fL (ref 7.5–12.5)
Monocytes Relative: 8.2 %
Neutro Abs: 4650 cells/uL (ref 1500–7800)
Neutrophils Relative %: 50 %
Platelets: 258 10*3/uL (ref 140–400)
RBC: 4.82 10*6/uL (ref 3.80–5.10)
RDW: 13.5 % (ref 11.0–15.0)
Total Lymphocyte: 30.3 %
WBC: 9.3 10*3/uL (ref 3.8–10.8)

## 2020-08-13 NOTE — Telephone Encounter (Signed)
Patient is here for labwork and requested prescription refill of Orencia to be sent to [facility].

## 2020-08-13 NOTE — Telephone Encounter (Signed)
Advised patient that orencia prescription that was sent to the pharmacy on 07/13/2020 had 2 additional refills attached. Patient verbalized understanding and will contact [facility] to schedule shipment.

## 2020-08-14 ENCOUNTER — Encounter: Payer: Self-pay | Admitting: Rheumatology

## 2020-08-14 ENCOUNTER — Other Ambulatory Visit: Payer: Self-pay

## 2020-08-14 ENCOUNTER — Encounter (HOSPITAL_COMMUNITY)
Admission: RE | Admit: 2020-08-14 | Discharge: 2020-08-14 | Disposition: A | Discharge status: Home / Self Care | Payer: Medicare Other | Source: Ambulatory Visit | Attending: Internal Medicine | Admitting: Internal Medicine

## 2020-08-14 DIAGNOSIS — J849 Interstitial pulmonary disease, unspecified: Secondary | ICD-10-CM

## 2020-08-14 NOTE — Progress Notes (Signed)
Hgb and hct remain slightly elevated but stable.  Absolute eosinophils are slightly elevated. WBC count is WNL.  We will continue to monitor.  CMP WNL.

## 2020-08-14 NOTE — Progress Notes (Signed)
Daily Session Note  Patient Details  Name: Deanna Norris MRN: [id number] Date of Birth: 05/01/1945 Referring Provider:   [name] Pulmonary Rehab Walk Test from 07/06/2020 in [place]  Referring Provider Dr. [name]       Encounter Date: 08/14/2020  Check In:  Session Check In - 08/14/20 1435       Check-In   Supervising physician immediately available to respond to emergencies [facility] immediately available    Physician(s) Dr. Broadus John    [facility]    Staff Present [name], RN, BSN;Carlette Wilber [place], RN, BSN;Lisa [name], RN;Jessica Hassell Done, MS, ACSM-CEP, Exercise Physiologist    Virtual Visit No    Medication changes reported     No    Fall or balance concerns reported    No    Tobacco Cessation No Change    Warm-up and Cool-down Performed as group-led instruction    Resistance Training Performed Yes    VAD Patient? No    PAD/SET Patient? No      Pain Assessment   Currently in Pain? No/denies    Multiple Pain Sites No             Capillary Blood Glucose: No results found for this or any previous visit (from the past 24 hour(s)).   Exercise Prescription Changes - 08/14/20 1500       Response to Exercise   Blood Pressure (Admit) 146/70    Blood Pressure (Exercise) 124/80    Blood Pressure (Exit) 110/72    Heart Rate (Admit) 61 bpm    Heart Rate (Exercise) 86 bpm    Heart Rate (Exit) 67 bpm    Oxygen Saturation (Admit) 94 %    Oxygen Saturation (Exercise) 95 %    Oxygen Saturation (Exit) 94 %    Rating of Perceived Exertion (Exercise) 11    Perceived Dyspnea (Exercise) 2    Duration Continue with 30 min of aerobic exercise without signs/symptoms of physical distress.    Intensity THRR unchanged      Progression   Progression Continue to progress workloads to maintain intensity without signs/symptoms of physical distress.      Resistance Training   Training Prescription Yes     Weight Red Bands    Reps 10-15    Time 10 Minutes      NuStep   Level 4    SPM 70    Minutes 15    METs 3.1      Track   Laps 22    Minutes 15             Social History   Tobacco Use  Smoking Status Never  Smokeless Tobacco Never    Goals Met:  Proper associated with RPD/PD & O2 Sat Exercise tolerated well No report of cardiac concerns or symptoms Strength training completed today  Goals Unmet:  Not Applicable  Comments: Service time is from 1320 to [place]    Dr. [name] is Medical Director for Cardiac Rehab at [facility].

## 2020-08-14 NOTE — Progress Notes (Signed)
Ok to increased imuran to 100 mg daily.  Recheck CBC and CMP in 2 weeks.

## 2020-08-16 ENCOUNTER — Other Ambulatory Visit: Payer: Self-pay

## 2020-08-16 ENCOUNTER — Encounter (HOSPITAL_COMMUNITY)
Admission: RE | Admit: 2020-08-16 | Discharge: 2020-08-16 | Disposition: A | Discharge status: Home / Self Care | Payer: Medicare Other | Source: Ambulatory Visit | Attending: Internal Medicine | Admitting: Internal Medicine

## 2020-08-16 DIAGNOSIS — J849 Interstitial pulmonary disease, unspecified: Secondary | ICD-10-CM

## 2020-08-16 NOTE — Progress Notes (Signed)
Daily Session Note  Patient Details  Name: Deanna Norris MRN: [id number] Date of Birth: 25-Oct-1945 Referring Provider:   [name] Pulmonary Rehab Walk Test from 07/06/2020 in [place]  Referring Provider Dr. [name]       Encounter Date: 08/16/2020  Check In:  Session Check In - 08/16/20 1412       Check-In   Supervising physician immediately available to respond to emergencies [facility] immediately available    Physician(s) Dr. Alfredia Ferguson    Staff Present [name], RN, BSN;Jetta Walker BS, ACSM EP-C, Exercise Physiologist;Jissel Slavens Clarisa Schools, MS, ACSM-CEP, Exercise Physiologist    Virtual Visit No    Medication changes reported     No    Fall or balance concerns reported    No    Tobacco Cessation No Change    Warm-up and Cool-down Performed as group-led instruction    Resistance Training Performed Yes    VAD Patient? No    PAD/SET Patient? No      Pain Assessment   Currently in Pain? No/denies    Pain Score 0-No pain    Multiple Pain Sites No             Capillary Blood Glucose: No results found for this or any previous visit (from the past 24 hour(s)).    Social History   Tobacco Use  Smoking Status Never  Smokeless Tobacco Never    Goals Met:  Exercise tolerated well No report of cardiac concerns or symptoms Strength training completed today  Goals Unmet:  Not Applicable  Comments: Service time is from 1325 to 1435    Dr. [name] is Medical Director for Cardiac Rehab at [facility].

## 2020-08-17 ENCOUNTER — Telehealth (HOSPITAL_COMMUNITY): Payer: Self-pay | Admitting: Family Medicine

## 2020-08-21 ENCOUNTER — Encounter (HOSPITAL_COMMUNITY): Payer: Medicare Other

## 2020-08-21 NOTE — Progress Notes (Signed)
Pulmonary Individual Treatment Plan  Patient Details  Name: Deanna Norris MRN: [id number] Date of Birth: July 31, 1945 Referring Provider:   [name] Pulmonary Rehab Walk Test from 07/06/2020 in [place]  Referring Provider Dr. [name]       Initial Encounter Date:  Flowsheet Row Pulmonary Rehab Walk Test from 07/06/2020 in [place]  Date 07/06/20       Visit Diagnosis: ILD (interstitial lung disease) ([place])  Patient's Home Medications on Admission:   Current Outpatient Medications:    acetaminophen (TYLENOL) 500 MG tablet, Take 500-1,000 mg by mouth every 8 (eight) hours as needed for moderate pain. (Patient not taking: Reported on 07/13/2020), Disp: , Rfl:    albuterol (PROAIR HFA) 108 (90 Base) MCG/ACT inhaler, Inhale 2 puffs into the lungs every 4 (four) hours as needed for wheezing or shortness of breath., Disp: 18 g, Rfl: 3   Ascorbic Acid (VITAMIN C PO), Take 500 mg by mouth daily., Disp: , Rfl:    aspirin 81 MG tablet, Take 81 mg by mouth daily., Disp: , Rfl:    azaTHIOprine (IMURAN) 50 MG tablet, Take 50 mg p.o. daily for 2 weeks and if labs are stable increase to 100 mg daily, Disp: 42 tablet, Rfl: 0   budesonide (PULMICORT) 180 MCG/ACT inhaler, Inhale 1 puff into the lungs daily as needed (asthma)., Disp: , Rfl:    Calcium Carbonate-Vitamin D (CALCIUM 500/D PO), Take 1 tablet by mouth 2 (two) times daily., Disp: , Rfl:    Cholecalciferol (VITAMIN D3) 2000 UNITS TABS, Take 2,000 Units by mouth daily., Disp: , Rfl:    cycloSPORINE (RESTASIS) 0.05 % ophthalmic emulsion, Place 1 drop into both eyes 2 (two) times daily., Disp: , Rfl:    desonide (DESOWEN) 0.05 % lotion, Apply 1 application topically daily as needed (irritation)., Disp: , Rfl:    diclofenac Sodium (VOLTAREN) 1 % GEL, Apply 2-4 grams to affected joint 4 times daily as needed. (Patient taking differently: Apply 2-4 g topically 4 (four) times  daily as needed (pain). Apply 2-4 grams to affected joint 4 times daily as needed.), Disp: 400 g, Rfl: 2   Estradiol (VAGIFEM) 10 MCG TABS vaginal tablet, Place 1 tablet (10 mcg total) vaginally 2 (two) times a week. Place one tablet (10 mcg) per vagina at bedtime twice a week., Disp: 24 tablet, Rfl: 3   famotidine (PEPCID) 40 MG tablet, Take 1 tablet (40 mg total) by mouth 2 (two) times daily., Disp: 180 tablet, Rfl: 3   fluconazole (DIFLUCAN) 100 MG tablet, Take 1 tablet (100 mg total) by mouth daily. Take 246m starting dose then 1055mdaily for 11 days., Disp: 13 tablet, Rfl: 0   fluconazole (DIFLUCAN) 100 MG tablet, Take 1 tablet (100 mg total) by mouth daily. Take 20076mtarting dose then 100m6mily for 11 days. (Patient not taking: Reported on 07/13/2020), Disp: 13 tablet, Rfl: 0   fluticasone (FLONASE) 50 MCG/ACT nasal spray, USE 2 SPRAYS IN EACH NOSTRIL DAILY AS NEEDED FOR RHINITIS OR ALLERGIES (Patient taking differently: Place 2 sprays into both nostrils daily as needed for allergies. USE 2 SPRAYS IN EACH NOSTRIL DAILY AS NEEDED FOR RHINITIS OR ALLERGIES), Disp: 48 g, Rfl: 3   folic acid (FOLVITE) 1 MG tablet, TAKE 2 TABLETS BY MOUTH DAILY (Patient taking differently: Take 2 mg by mouth daily.), Disp: 180 tablet, Rfl: 3   ibuprofen (ADVIL) 200 MG tablet, Take 200-400 mg by mouth every 8 (eight) hours  as needed (pain)., Disp: , Rfl:    Magnesium Malate 1250 (141.7 Mg) MG TABS, Take 3 tablets by mouth daily. , Disp: , Rfl:    methotrexate 50 MG/2ML injection, Inject 0.6 mLs (15 mg total) into the skin once a week., Disp: 8 mL, Rfl: 0   metroNIDAZOLE (METROCREAM) 0.75 % cream, Apply 1 application topically 2 (two) times daily as needed (rosacea)., Disp: , Rfl:    Multiple Vitamin (MULTI-VITAMINS) TABS, Take 1 tablet by mouth daily., Disp: , Rfl:    Omega-3 1000 MG CAPS, Take 1,000 mg by mouth daily., Disp: , Rfl:    ORENCIA 125 MG/ML SOSY, INJECT THE CONTENTS OF 1 SYRINGE UNDER THE SKIN EVERY  WEEK, Disp: 4 mL, Rfl: 2   Propylene Glycol (SYSTANE BALANCE) 0.6 % SOLN, Place 1 drop into both eyes daily as needed (dry eyes)., Disp: , Rfl:    Turmeric 500 MG CAPS, Take 500 mg by mouth daily., Disp: , Rfl:    Zinc 50 MG TABS, Take 50 mg by mouth daily., Disp: , Rfl:    zoledronic acid (RECLAST) 5 MG/100ML SOLN injection, Inject 5 mg into the vein once. Takes yearly, Disp: , Rfl:    zolpidem (AMBIEN) 10 MG tablet, Take 0.5 tablets (5 mg total) by mouth at bedtime as needed for sleep., Disp: 30 tablet, Rfl: 2  Past Medical History: Past Medical History:  Diagnosis Date   Asthma    Constipation    Fibroid    GERD (gastroesophageal reflux disease)    Insomnia    Lumbar vertebral fracture (HCC)    L5-S1   Menopausal symptoms    Osteoporosis    Postmenopausal HRT (hormone replacement therapy)    RA (rheumatoid arthritis) ([place]) 11/2004   methotrexate and Embrel   Rosacea    Sjogren's disease ([place])    Sleep apnea 2008   C-Pap   Vitamin D deficiency     Tobacco Use: Social History   Tobacco Use  Smoking Status Never  Smokeless Tobacco Never    Labs: Recent Review Flowsheet Data     Labs for ITP Cardiac and Pulmonary Rehab Latest Ref Rng & Units 03/24/2016 04/01/2017 06/09/2018 06/16/2019 06/18/2020   Cholestrol 0 - 200 mg/dL 196 220(H) 228(H) 216(H) 215(H)   LDLCALC 0 - 99 mg/dL 115(H) 131(H) 134(H) 141(H) 129(H)   LDLDIRECT mg/dL - - - - -   HDL >39.00 mg/dL 64.40 67.30 68.10 59.50 55.60   Trlycerides 0.0 - 149.0 mg/dL 85.0 105.0 133.0 78.0 151.0(H)       Capillary Blood Glucose: No results found for: GLUCAP   Pulmonary Assessment Scores:  Pulmonary Assessment Scores     Row Name 07/06/20 1538         ADL UCSD   ADL Phase Entry           CAT Score   CAT Score 12           mMRC Score   mMRC Score 2             UCSD: Self-administered rating of dyspnea associated with activities of daily living (ADLs) 6-point scale (0 = "not at all" to 5 = "maximal  or unable to do because of breathlessness")  Scoring Scores range from 0 to 120.  Minimally important difference is 5 units  CAT: CAT can identify the health impairment of COPD patients and is better correlated with disease progression.  CAT has a scoring range of zero to 40. The CAT score is  classified into four groups of low (less than 10), medium (10 - 20), high (21-30) and very high (31-40) based on the impact level of disease on health status. A CAT score over 10 suggests significant symptoms.  A worsening CAT score could be explained by an exacerbation, poor medication adherence, poor inhaler technique, or progression of COPD or comorbid conditions.  CAT MCID is 2 points  mMRC: mMRC (Modified Medical Research Council) Dyspnea Scale is used to assess the degree of baseline functional disability in patients of respiratory disease due to dyspnea. No minimal important difference is established. A decrease in score of 1 point or greater is considered a positive change.   Pulmonary Function Assessment:  Pulmonary Function Assessment - 07/06/20 1408       Breath   Shortness of Breath Yes;Limiting activity             Exercise Target Goals: Exercise Program Goal: Individual exercise prescription set using results from initial 6 min walk test and THRR while considering  patient's activity barriers and safety.   Exercise Prescription Goal: Initial exercise prescription builds to 30-45 minutes a day of aerobic activity, 2-3 days per week.  Home exercise guidelines will be given to patient during program as part of exercise prescription that the participant will acknowledge.  Activity Barriers & Risk Stratification:  Activity Barriers & Cardiac Risk Stratification - 07/06/20 1405       Activity Barriers & Cardiac Risk Stratification   Activity Barriers Arthritis;Shortness of Breath;Back Problems;Joint Problems;Deconditioning   Reumatoid Arthritis            6 Minute Walk:  6  Minute Walk     Row Name 07/06/20 1524         6 Minute Walk   Phase Initial     Distance 1447 feet     Walk Time 6 minutes     # of Rest Breaks 0     MPH 2.74     METS 2.77     RPE 11     Perceived Dyspnea  2     VO2 Peak 9.68     Symptoms No     Resting HR 59 bpm     Resting BP 124/88     Resting Oxygen Saturation  97 %     Exercise Oxygen Saturation  during 6 min walk 94 %     Max Ex. HR 83 bpm     Max Ex. BP 138/78     2 Minute Post BP 124/70           Interval HR   1 Minute HR 75     2 Minute HR 80     3 Minute HR 87     4 Minute HR 83     5 Minute HR 83     6 Minute HR 83     2 Minute Post HR 50     Interval Heart Rate? Yes           Interval Oxygen   Interval Oxygen? Yes     Baseline Oxygen Saturation % 97 %     1 Minute Oxygen Saturation % 95 %     1 Minute Liters of Oxygen 0 L     2 Minute Oxygen Saturation % 94 %     2 Minute Liters of Oxygen 0 L     3 Minute Oxygen Saturation % 94 %     3 Minute Liters of Oxygen 0  L     4 Minute Oxygen Saturation % 95 %     4 Minute Liters of Oxygen 0 L     5 Minute Oxygen Saturation % 94 %     5 Minute Liters of Oxygen 0 L     6 Minute Oxygen Saturation % 94 %     6 Minute Liters of Oxygen 0 L     2 Minute Post Oxygen Saturation % 97 %     2 Minute Post Liters of Oxygen 0 L              Oxygen Initial Assessment:  Oxygen Initial Assessment - 07/06/20 1407       Home Oxygen   Home Oxygen Device None    Sleep Oxygen Prescription CPAP    Home Exercise Oxygen Prescription None    Home Resting Oxygen Prescription None    Compliance with Home Oxygen Use Yes      Initial 6 min Walk   Oxygen Used None      Program Oxygen Prescription   Program Oxygen Prescription None      Intervention   Short Term Goals To learn and understand importance of monitoring SPO2 with pulse oximeter and demonstrate accurate use of the pulse oximeter.;To learn and exhibit compliance with exercise, home and travel O2  prescription;To learn and understand importance of maintaining oxygen saturations>88%;To learn and demonstrate proper pursed lip breathing techniques or other breathing techniques. ;To learn and demonstrate proper use of respiratory medications    Long  Term Goals Exhibits compliance with exercise, home  and travel O2 prescription;Verbalizes importance of monitoring SPO2 with pulse oximeter and return demonstration;Maintenance of O2 saturations>88%;Exhibits proper breathing techniques, such as pursed lip breathing or other method taught during program session;Compliance with respiratory medication;Demonstrates proper use of MDI's             Oxygen Re-Evaluation:  Oxygen Re-Evaluation     Row Name 07/23/20 1621 08/21/20 0851           Program Oxygen Prescription   Program Oxygen Prescription None None             Home Oxygen   Home Oxygen Device None None      Sleep Oxygen Prescription CPAP CPAP      Liters per minute -- 0      Home Exercise Oxygen Prescription None None      Home Resting Oxygen Prescription None None      Compliance with Home Oxygen Use Yes Yes             Goals/Expected Outcomes   Short Term Goals To learn and understand importance of monitoring SPO2 with pulse oximeter and demonstrate accurate use of the pulse oximeter.;To learn and exhibit compliance with exercise, home and travel O2 prescription;To learn and understand importance of maintaining oxygen saturations>88%;To learn and demonstrate proper pursed lip breathing techniques or other breathing techniques. ;To learn and demonstrate proper use of respiratory medications --      Long  Term Goals Exhibits compliance with exercise, home  and travel O2 prescription;Verbalizes importance of monitoring SPO2 with pulse oximeter and return demonstration;Maintenance of O2 saturations>88%;Exhibits proper breathing techniques, such as pursed lip breathing or other method taught during program session;Compliance with  respiratory medication;Demonstrates proper use of MDI's --      Comments -- Compliant with CPAP use, no supplemental oxygen needed.      Goals/Expected Outcomes compliance and understanding of monitoring oxygen saturation at home and  understanding importance of pursed lip breathing. --               Oxygen Discharge (Final Oxygen Re-Evaluation):  Oxygen Re-Evaluation - 08/21/20 0851       Program Oxygen Prescription   Program Oxygen Prescription None      Home Oxygen   Home Oxygen Device None    Sleep Oxygen Prescription CPAP    Liters per minute 0    Home Exercise Oxygen Prescription None    Home Resting Oxygen Prescription None    Compliance with Home Oxygen Use Yes      Goals/Expected Outcomes   Comments Compliant with CPAP use, no supplemental oxygen needed.             Initial Exercise Prescription:  Initial Exercise Prescription - 07/06/20 1500       Date of Initial Exercise RX and Referring Provider   Date 07/06/20    Referring Provider Dr. [name]    Expected Discharge Date 09/06/20      NuStep   Level 2    SPM 70    Minutes 15      Track   Minutes 15      Prescription Details   Frequency (times per week) 2    Duration Progress to 30 minutes of continuous aerobic without signs/symptoms of physical distress      Intensity   THRR 40-80% of Max Heartrate 58-117    Ratings of Perceived Exertion 11-13    Perceived Dyspnea 0-4      Progression   Progression Continue to progress workloads to maintain intensity without signs/symptoms of physical distress.      Resistance Training   Training Prescription Yes    Weight Red bands    Reps 10-15             Perform Capillary Blood Glucose checks as needed.  Exercise Prescription Changes:   Exercise Prescription Changes     Row Name 07/17/20 1500 07/26/20 1526 08/14/20 1500         Response to Exercise   Blood Pressure (Admit) 133/70 138/80 146/70     Blood Pressure (Exercise) 138/78  -- 124/80     Blood Pressure (Exit) 110/58 110/70 110/72     Heart Rate (Admit) 65 bpm 68 bpm 61 bpm     Heart Rate (Exercise) 79 bpm 84 bpm 86 bpm     Heart Rate (Exit) 73 bpm 77 bpm 67 bpm     Oxygen Saturation (Admit) 94 % 94 % 94 %     Oxygen Saturation (Exercise) 95 % 92 % 95 %     Oxygen Saturation (Exit) 97 % 94 % 94 %     Rating of Perceived Exertion (Exercise) 9 11 11      Perceived Dyspnea (Exercise) 1 2 2      Duration Continue with 30 min of aerobic exercise without signs/symptoms of physical distress. Continue with 30 min of aerobic exercise without signs/symptoms of physical distress. Continue with 30 min of aerobic exercise without signs/symptoms of physical distress.     Intensity Other (comment)  40-80% of HRR THRR unchanged THRR unchanged           Progression   Progression Continue to progress workloads to maintain intensity without signs/symptoms of physical distress. Continue to progress workloads to maintain intensity without signs/symptoms of physical distress. Continue to progress workloads to maintain intensity without signs/symptoms of physical distress.           Resistance  Training   Training Prescription Yes Yes Yes     Weight Red bands Red Bands Red Bands     Reps 10-15 10-15 10-15     Time 10 Minutes 10 Minutes 10 Minutes           NuStep   Level 2 3 4      SPM 70 70 70     Minutes 15 15 15      METs 2 2.9 3.1           Track   Laps 21 23 22      Minutes 15 15 15               Exercise Comments:   Exercise Comments     Row Name 07/10/20 1517           Exercise Comments Pt completed first day of exercise and tolerated well with no complaints or concerns. She was able to do 15 minutes walking the track and 15 minutes on the Nustep with no rest breaks. She also completed resistance training with no issues. Will continue to monitor.                Exercise Goals and Review:   Exercise Goals     Row Name 07/06/20 1523 08/21/20 0950            Exercise Goals   Increase Physical Activity Yes Yes      Intervention Provide advice, education, support and counseling about physical activity/exercise needs.;Develop an individualized exercise prescription for aerobic and resistive training based on initial evaluation findings, risk stratification, comorbidities and participant's personal goals. Provide advice, education, support and counseling about physical activity/exercise needs.;Develop an individualized exercise prescription for aerobic and resistive training based on initial evaluation findings, risk stratification, comorbidities and participant's personal goals.      Expected Outcomes Short Term: Attend rehab on a regular basis to increase amount of physical activity.;Long Term: Add in home exercise to make exercise part of routine and to increase amount of physical activity.;Long Term: Exercising regularly at least 3-5 days a week. Short Term: Attend rehab on a regular basis to increase amount of physical activity.;Long Term: Add in home exercise to make exercise part of routine and to increase amount of physical activity.;Long Term: Exercising regularly at least 3-5 days a week.      Increase Strength and Stamina Yes Yes      Intervention Provide advice, education, support and counseling about physical activity/exercise needs.;Develop an individualized exercise prescription for aerobic and resistive training based on initial evaluation findings, risk stratification, comorbidities and participant's personal goals. Provide advice, education, support and counseling about physical activity/exercise needs.;Develop an individualized exercise prescription for aerobic and resistive training based on initial evaluation findings, risk stratification, comorbidities and participant's personal goals.      Expected Outcomes Short Term: Increase workloads from initial exercise prescription for resistance, speed, and METs.;Short Term: Perform  resistance training exercises routinely during rehab and add in resistance training at home;Long Term: Improve cardiorespiratory fitness, muscular endurance and strength as measured by increased METs and functional capacity (6MWT) Short Term: Increase workloads from initial exercise prescription for resistance, speed, and METs.;Short Term: Perform resistance training exercises routinely during rehab and add in resistance training at home;Long Term: Improve cardiorespiratory fitness, muscular endurance and strength as measured by increased METs and functional capacity (6MWT)      Able to understand and use rate of perceived exertion (RPE) scale Yes Yes      Intervention  Provide education and explanation on how to use RPE scale Provide education and explanation on how to use RPE scale      Expected Outcomes Short Term: Able to use RPE daily in rehab to express subjective intensity level;Long Term:  Able to use RPE to guide intensity level when exercising independently Short Term: Able to use RPE daily in rehab to express subjective intensity level;Long Term:  Able to use RPE to guide intensity level when exercising independently      Able to understand and use Dyspnea scale Yes Yes      Intervention Provide education and explanation on how to use Dyspnea scale Provide education and explanation on how to use Dyspnea scale      Expected Outcomes Short Term: Able to use Dyspnea scale daily in rehab to express subjective sense of shortness of breath during exertion;Long Term: Able to use Dyspnea scale to guide intensity level when exercising independently Short Term: Able to use Dyspnea scale daily in rehab to express subjective sense of shortness of breath during exertion;Long Term: Able to use Dyspnea scale to guide intensity level when exercising independently      Knowledge and understanding of Target Heart Rate Range (THRR) Yes Yes      Intervention Provide education and explanation of THRR including how the  numbers were predicted and where they are located for reference Provide education and explanation of THRR including how the numbers were predicted and where they are located for reference      Expected Outcomes Short Term: Able to state/look up THRR;Short Term: Able to use daily as guideline for intensity in rehab;Long Term: Able to use THRR to govern intensity when exercising independently Short Term: Able to state/look up THRR;Short Term: Able to use daily as guideline for intensity in rehab;Long Term: Able to use THRR to govern intensity when exercising independently      Understanding of Exercise Prescription Yes Yes      Intervention Provide education, explanation, and written materials on patient's individual exercise prescription Provide education, explanation, and written materials on patient's individual exercise prescription      Expected Outcomes Short Term: Able to explain program exercise prescription;Long Term: Able to explain home exercise prescription to exercise independently Short Term: Able to explain program exercise prescription;Long Term: Able to explain home exercise prescription to exercise independently               Exercise Goals Re-Evaluation :  Exercise Goals Re-Evaluation     Row Name 07/23/20 1617 08/21/20 0953           Exercise Goal Re-Evaluation   Exercise Goals Review Increase Physical Activity;Increase Strength and Stamina;Able to understand and use rate of perceived exertion (RPE) scale;Able to understand and use Dyspnea scale;Knowledge and understanding of Target Heart Rate Range (THRR);Understanding of Exercise Prescription Increase Physical Activity;Increase Strength and Stamina;Able to understand and use rate of perceived exertion (RPE) scale;Able to understand and use Dyspnea scale;Knowledge and understanding of Target Heart Rate Range (THRR);Understanding of Exercise Prescription      Comments Deanna Norris has completed 4 exercise sessions and has  tolerated well so far. She has already made workload increases in the short time that she has been in the program. She is exercising at 2.4 METS on the Nustep and is walking 21 laps in 15 minutes. Will continue to monitor and progress as she is able. Deanna Norris has completed 8 exercise sessions and has tolerated well. She has progressed on the Nustep and track. Deanna Norris  has averaged 3.0 METS on the Nustep and walked 26 laps in 15 minutes. She performs the warm up and resistance exercises/cool down standing without limitations. Will continue to monitor and progress as she is able.      Expected Outcomes Through exercise at rehab and home, the patient will decrease shortness of breath with daily activities and feel confident in carrying out an exercise regimn at home. Through exercise at rehab and home, the patient will decrease shortness of breath with daily activities and feel confident in carrying out an exercise regimn at home.               Discharge Exercise Prescription (Final Exercise Prescription Changes):  Exercise Prescription Changes - 08/14/20 1500       Response to Exercise   Blood Pressure (Admit) 146/70    Blood Pressure (Exercise) 124/80    Blood Pressure (Exit) 110/72    Heart Rate (Admit) 61 bpm    Heart Rate (Exercise) 86 bpm    Heart Rate (Exit) 67 bpm    Oxygen Saturation (Admit) 94 %    Oxygen Saturation (Exercise) 95 %    Oxygen Saturation (Exit) 94 %    Rating of Perceived Exertion (Exercise) 11    Perceived Dyspnea (Exercise) 2    Duration Continue with 30 min of aerobic exercise without signs/symptoms of physical distress.    Intensity THRR unchanged      Progression   Progression Continue to progress workloads to maintain intensity without signs/symptoms of physical distress.      Resistance Training   Training Prescription Yes    Weight Red Bands    Reps 10-15    Time 10 Minutes      NuStep   Level 4    SPM 70    Minutes 15    METs 3.1      Track    Laps 22    Minutes 15             Nutrition:  Target Goals: Understanding of nutrition guidelines, daily intake of sodium <1531m, cholesterol <2079m calories 30% from fat and 7% or less from saturated fats, daily to have 5 or more servings of fruits and vegetables.  Biometrics:  Pre Biometrics - 07/06/20 1524       Pre Biometrics   Grip Strength 18 kg              Nutrition Therapy Plan and Nutrition Goals:  Nutrition Therapy & Goals - 07/17/20 1457       Nutrition Therapy   Diet General Healthful      Personal Nutrition Goals   Nutrition Goal Pt to identify food quantities necessary to achieve weight loss of 6-24 lb at graduation from cardiac rehab.    Personal Goal #2 Pt to build a healthy plate including vegetables, fruits, whole grains, and low-fat dairy products in a heart healthy meal plan.      Intervention Plan   Intervention Prescribe, educate and counsel regarding individualized specific dietary modifications aiming towards targeted core components such as weight, hypertension, lipid management, diabetes, heart failure and other comorbidities.;Nutrition handout(s) given to patient.    Expected Outcomes Short Term Goal: A plan has been developed with personal nutrition goals set during dietitian appointment.;Long Term Goal: Adherence to prescribed nutrition plan.             Nutrition Assessments:  MEDIFICTS Score Key: ?70 Need to make dietary changes  40-70 Heart Healthy Diet ? 40 Therapeutic Level  Cholesterol Diet  Flowsheet Row PULMONARY REHAB OTHER RESPIRATORY from 07/17/2020 in [place]  Picture Your Plate Total Score on Admission 68      Picture Your Plate Scores: <37 Unhealthy dietary pattern with much room for improvement. 41-50 Dietary pattern unlikely to meet recommendations for good health and room for improvement. 51-60 More healthful dietary pattern, with some room for improvement.  >60 Healthy  dietary pattern, although there may be some specific behaviors that could be improved.    Nutrition Goals Re-Evaluation:  Nutrition Goals Re-Evaluation     [place] Name 07/17/20 1458 08/16/20 0716           Goals   Current Weight 176 lb (79.8 kg) 181 lb 7 oz (82.3 kg)      Nutrition Goal Pt to identify food quantities necessary to achieve weight loss of 6-24 lb at graduation from cardiac rehab. Pt to identify food quantities necessary to achieve weight loss of 6-24 lb at graduation from cardiac rehab.      Comment -- 4 lb weight gain             Personal Goal #2 Re-Evaluation   Personal Goal #2 Pt to build a healthy plate including vegetables, fruits, whole grains, and low-fat dairy products in a heart healthy meal plan. Pt to build a healthy plate including vegetables, fruits, whole grains, and low-fat dairy products in a heart healthy meal plan.               Nutrition Goals Discharge (Final Nutrition Goals Re-Evaluation):  Nutrition Goals Re-Evaluation - 08/16/20 0716       Goals   Current Weight 181 lb 7 oz (82.3 kg)    Nutrition Goal Pt to identify food quantities necessary to achieve weight loss of 6-24 lb at graduation from cardiac rehab.    Comment 4 lb weight gain      Personal Goal #2 Re-Evaluation   Personal Goal #2 Pt to build a healthy plate including vegetables, fruits, whole grains, and low-fat dairy products in a heart healthy meal plan.             Psychosocial: Target Goals: Acknowledge presence or absence of significant depression and/or stress, maximize coping skills, provide positive support system. Participant is able to verbalize types and ability to use techniques and skills needed for reducing stress and depression.  Initial Review & Psychosocial Screening:  Initial Psych Review & Screening - 07/06/20 1408       Initial Review   Current issues with None Identified      Family Dynamics   Good Support System? Yes   Husband, friends and family      Barriers   Psychosocial barriers to participate in program The patient should benefit from training in stress management and relaxation.      Screening Interventions   Interventions Encouraged to exercise    Expected Outcomes Long Term Goal: Stressors or current issues are controlled or eliminated.;Short Term goal: Identification and review with participant of any Quality of Life or Depression concerns found by scoring the questionnaire.;Long Term goal: The participant improves quality of Life and PHQ9 Scores as seen by post scores and/or verbalization of changes             Quality of Life Scores:  Scores of 19 and below usually indicate a poorer quality of life in these areas.  A difference of  2-3 points is a clinically meaningful difference.  A difference of  2-3 points in the total score of the Quality of Life Index has been associated with significant improvement in overall quality of life, self-image, physical symptoms, and general health in studies assessing change in quality of life.  PHQ-9: Recent Review Flowsheet Data     Depression screen [facility] 2/9 07/06/2020 07/02/2020 06/16/2019 04/08/2018 04/01/2017   Decreased Interest 0 0 0 0 0   Down, Depressed, Hopeless 0 0 0 0 0   PHQ - 2 Score 0 0 0 0 0   Altered sleeping 0 1 0 0 0   Tired, decreased energy 2 2 0 0 0   Change in appetite 0 1 0 0 0   Feeling bad or failure about yourself  0 0 0 0 0   Trouble concentrating 0 0 0 0 0   Moving slowly or fidgety/restless - 0 0 0 0   Suicidal thoughts 0 0 0 0 0   PHQ-9 Score 2 4 0 0 0   Difficult doing work/chores - - Not difficult at all Not difficult at all Not difficult at all      Interpretation of Total Score  Total Score Depression Severity:  1-4 = Minimal depression, 5-9 = Mild depression, 10-14 = Moderate depression, 15-19 = Moderately severe depression, 20-27 = Severe depression   Psychosocial Evaluation and Intervention:  Psychosocial Evaluation - 07/06/20 1530        Psychosocial Evaluation & Interventions   Interventions Encouraged to exercise with the program and follow exercise prescription    Comments Patient does not show any psychosocial barriers to exercise and seems to have a positive outlook.    Expected Outcomes For pt to remain free of psychosocial barriers throughout rehab program and for pt to have continue mental well being    Continue Psychosocial Services  No Follow up required             Psychosocial Re-Evaluation:  Psychosocial Re-Evaluation     [place] Name 07/23/20 1339 08/20/20 1442           Psychosocial Re-Evaluation   Current issues with None Identified None Identified      Comments No psychosocial concerns identified at this time with a positive attitude. No concerns identified      Expected Outcomes For Deanna Norris to continue to be free of psychosocial concerns while participating in pulmonary rehab. For Deanna Norris to continue to be free of psychosocial concerns while participating in pulmonary rehab.      Interventions Encouraged to attend Pulmonary Rehabilitation for the exercise Encouraged to attend Pulmonary Rehabilitation for the exercise      Continue Psychosocial Services  No Follow up required No Follow up required               Psychosocial Discharge (Final Psychosocial Re-Evaluation):  Psychosocial Re-Evaluation - 08/20/20 1442       Psychosocial Re-Evaluation   Current issues with None Identified    Comments No concerns identified    Expected Outcomes For Deanna Norris to continue to be free of psychosocial concerns while participating in pulmonary rehab.    Interventions Encouraged to attend Pulmonary Rehabilitation for the exercise    Continue Psychosocial Services  No Follow up required             Education: Education Goals: Education classes will be provided on a weekly basis, covering required topics. Participant will state understanding/return demonstration of topics presented.  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 Instructor discusses the definition of a risk factor, different risk factors for pulmonary disease, and how the heart and lungs work together.     Nutrition for Pulmonary Patient:  -Group instruction provided by PowerPoint slides, verbal discussion, and written materials to support subject matter. The instructor gives an explanation and review of healthy diet recommendations, which includes a discussion on weight management, recommendations for fruit and vegetable consumption, as well as protein, fluid, caffeine, fiber, sodium, sugar, and alcohol. Tips for eating when patients are short of breath are discussed.   Pursed Lip Breathing:  -Group instruction that is supported by demonstration and informational handouts. Instructor discusses the benefits of pursed lip and diaphragmatic breathing and detailed demonstration on how to preform both.     Oxygen Safety:  -Group instruction provided by PowerPoint, verbal discussion, and written material to support subject matter. There is an overview of "What is Oxygen" and "Why do we need it".  Instructor also reviews how to create a safe environment for oxygen use, the importance of using oxygen as prescribed, and the risks of noncompliance. There is a brief discussion on traveling with oxygen and resources the patient may utilize.   Oxygen Equipment:  -Group instruction provided by [facility] Staff utilizing handouts, written materials, and equipment demonstrations.   Signs and Symptoms:  -Group instruction provided by written  material and verbal discussion to support subject matter. Warning signs and symptoms of infection, stroke, and heart attack are reviewed and when to call the physician/911 reinforced. Tips for preventing the spread of infection discussed.   Advanced Directives:  -Group instruction provided by verbal instruction and written material to support subject matter. Instructor reviews Advanced Directive laws and proper instruction for filling out document.   Pulmonary Video:  -Group video education that reviews the importance of medication and oxygen compliance, exercise, good nutrition, pulmonary hygiene, and pursed lip and diaphragmatic breathing for the pulmonary patient.   Exercise for the Pulmonary Patient:  -Group instruction that is supported by a PowerPoint presentation. Instructor discusses benefits of exercise, core components of exercise, frequency, duration, and intensity of an exercise routine, importance of utilizing pulse oximetry during exercise, safety while exercising, and options of places to exercise outside of rehab.   Flowsheet Row PULMONARY REHAB OTHER RESPIRATORY from 07/26/2020 in [place]  Date 07/19/20  Educator handout       Pulmonary Medications:  -Verbally interactive group education provided by instructor with focus on inhaled medications and proper administration. Flowsheet Row PULMONARY REHAB OTHER RESPIRATORY from 08/16/2020 in [place]  Date 08/09/20  Educator Handout       Anatomy and Physiology of the Respiratory System and Intimacy:  -Group instruction provided by PowerPoint, verbal discussion, and written material to support subject matter. Instructor reviews respiratory cycle and anatomical components of the respiratory system and their functions. Instructor also reviews differences in obstructive and restrictive respiratory diseases with examples of each. Intimacy, Sex, and Sexuality  differences are reviewed with a discussion on how relationships can change when diagnosed with pulmonary disease. Common sexual concerns are reviewed. Flowsheet Row PULMONARY REHAB OTHER RESPIRATORY from 08/16/2020 in [facility]  CARDIAC REHAB  Date 08/16/20  Educator Handout       MD DAY -A group question and answer session with a medical doctor that allows participants to ask questions that relate to their pulmonary disease state.   OTHER EDUCATION -Group or individual verbal, written, or video instructions that support the educational goals of the pulmonary rehab program. [place] from 08/16/2020 in [place]  Date 08/02/20  Educator [place]  Instruction Review Code 1- Verbalizes Understanding       Holiday Eating Survival Tips:  -Group instruction provided by PowerPoint slides, verbal discussion, and written materials to support subject matter. The instructor gives patients tips, tricks, and techniques to help them not only survive but enjoy the holidays despite the onslaught of food that accompanies the holidays.   Knowledge Questionnaire Score:  Knowledge Questionnaire Score - 07/06/20 1537       Knowledge Questionnaire Score   Pre Score 14/18             Core Components/Risk Factors/Patient Goals at Admission:  Personal Goals and Risk Factors at Admission - 07/06/20 1534       Core Components/Risk Factors/Patient Goals on Admission   Expected Outcomes Long Term: Adherence to nutrition and physical activity/exercise program aimed toward attainment of established weight goal;Weight Loss: Understanding of general recommendations for a balanced deficit meal plan, which promotes 1-2 lb weight loss per week and includes a negative energy balance of [phone] kcal/d;Short Term: Continue to assess and modify interventions until short term weight is achieved;Understanding  of distribution of calorie intake throughout the day with the consumption of 4-5 meals/snacks    Improve shortness of breath with ADL's Yes    Intervention Provide education, individualized exercise plan and daily activity instruction to help decrease symptoms of SOB with activities of daily living.    Expected Outcomes Short Term: Improve cardiorespiratory fitness to achieve a reduction of symptoms when performing ADLs;Long Term: Be able to perform more ADLs without symptoms or delay the onset of symptoms             Core Components/Risk Factors/Patient Goals Review:   Goals and Risk Factor Review     Row Name 07/23/20 1340 08/20/20 1443           Core Components/Risk Factors/Patient Goals Review   Personal Goals Review Develop more efficient breathing techniques such as purse lipped breathing and diaphragmatic breathing and practicing self-pacing with activity.;Increase knowledge of respiratory medications and ability to use respiratory devices properly.;Improve shortness of breath with ADL's Increase knowledge of respiratory medications and ability to use respiratory devices properly.;Develop more efficient breathing techniques such as purse lipped breathing and diaphragmatic breathing and practicing self-pacing with activity.;Improve shortness of breath with ADL's      Review Deanna Norris is high functioning for a pulmonary rehab patient.  She has attended 2 weeks of exercise classes and is already walking almost a mile in 15 minutes and 2.4 mets on the nustep.  She is also learning safe and appropriate guideline for exercise. Deanna Norris is exercising at a high level, she is walking almosat a mile in 15 minutes and level 4 of the nustep @ 3 mets.  She has increased her strength and stamina while participating in pulmonary rehab.      Expected Outcomes See admission goals. See addmission.               Core Components/Risk Factors/Patient Goals at Discharge (Final  Review):   Goals and  Risk Factor Review - 08/20/20 1443       Core Components/Risk Factors/Patient Goals Review   Personal Goals Review Increase knowledge of respiratory medications and ability to use respiratory devices properly.;Develop more efficient breathing techniques such as purse lipped breathing and diaphragmatic breathing and practicing self-pacing with activity.;Improve shortness of breath with ADL's    Review Deanna Norris is exercising at a high level, she is walking almosat a mile in 15 minutes and level 4 of the nustep @ 3 mets.  She has increased her strength and stamina while participating in pulmonary rehab.    Expected Outcomes See addmission.             ITP Comments:   Comments: ITP REVIEW Pt is making expected progress toward pulmonary rehab goals after completing 10 sessions. Recommend continued exercise, life style modification, education, and utilization of breathing techniques to increase stamina and strength and decrease shortness of breath with exertion.

## 2020-08-23 ENCOUNTER — Encounter (HOSPITAL_COMMUNITY)
Admission: RE | Admit: 2020-08-23 | Discharge: 2020-08-23 | Disposition: A | Discharge status: Home / Self Care | Payer: Medicare Other | Source: Ambulatory Visit | Attending: Internal Medicine | Admitting: Internal Medicine

## 2020-08-23 ENCOUNTER — Other Ambulatory Visit: Payer: Self-pay

## 2020-08-23 DIAGNOSIS — J849 Interstitial pulmonary disease, unspecified: Secondary | ICD-10-CM | POA: Diagnosis not present

## 2020-08-23 NOTE — Progress Notes (Signed)
Daily Session Note  Patient Details  Name: Deanna Norris MRN: [id number] Date of Birth: [birth year] Referring Provider:   [name] Pulmonary Rehab Walk Test from 07/06/2020 in [place]  Referring Provider Dr. [name]       Encounter Date: 08/23/2020  Check In:  Session Check In - 08/23/20 1418       Check-In   Supervising physician immediately available to respond to emergencies [facility] immediately available    Physician(s) Dr. Mal Misty    Location [facility]    Staff Present Trish Fountain, RN, Quentin Ore, MS, ACSM-CEP, Exercise Physiologist;David Lilyan Punt, MS, ACSM-CEP, CCRP, Exercise Physiologist    Virtual Visit No    Medication changes reported     No    Fall or balance concerns reported    No    Tobacco Cessation No Change    Warm-up and Cool-down Performed as group-led instruction    Resistance Training Performed Yes    VAD Patient? No    PAD/SET Patient? No      Pain Assessment   Currently in Pain? No/denies    Multiple Pain Sites No             Capillary Blood Glucose: No results found for this or any previous visit (from the past 24 hour(s)).    Social History   Tobacco Use  Smoking Status Never  Smokeless Tobacco Never    Goals Met:  Proper associated with RPD/PD & O2 Sat Independence with exercise equipment Exercise tolerated well No report of cardiac concerns or symptoms Strength training completed today  Goals Unmet:  Not Applicable  Comments: Service time is from 1315 to 1430   Dr. [name] is Medical Director for Cardiac Rehab at [facility].

## 2020-08-24 ENCOUNTER — Other Ambulatory Visit: Payer: Self-pay | Admitting: Rheumatology

## 2020-08-24 ENCOUNTER — Other Ambulatory Visit: Payer: Self-pay | Admitting: *Deleted

## 2020-08-24 DIAGNOSIS — Z79899 Other long term (current) drug therapy: Secondary | ICD-10-CM

## 2020-08-24 LAB — CBC WITH DIFFERENTIAL/PLATELET
Absolute Monocytes: 804 cells/uL (ref 200–950)
Basophils Absolute: 98 cells/uL (ref 0–200)
Basophils Relative: 1 %
Eosinophils Absolute: 911 cells/uL — ABNORMAL HIGH (ref 15–500)
Eosinophils Relative: 9.3 %
HCT: 47.4 % — ABNORMAL HIGH (ref 35.0–45.0)
Hemoglobin: 15.7 g/dL — ABNORMAL HIGH (ref 11.7–15.5)
Lymphs Abs: 2666 cells/uL (ref 850–3900)
MCH: 32 pg (ref 27.0–33.0)
MCHC: 33.1 g/dL (ref 32.0–36.0)
MCV: 96.7 fL (ref 80.0–100.0)
MPV: 11.8 fL (ref 7.5–12.5)
Monocytes Relative: 8.2 %
Neutro Abs: 5321 cells/uL (ref 1500–7800)
Neutrophils Relative %: 54.3 %
Platelets: 252 10*3/uL (ref 140–400)
RBC: 4.9 10*6/uL (ref 3.80–5.10)
RDW: 13.2 % (ref 11.0–15.0)
Total Lymphocyte: 27.2 %
WBC: 9.8 10*3/uL (ref 3.8–10.8)

## 2020-08-24 LAB — COMPLETE METABOLIC PANEL WITH GFR
AG Ratio: 1.8 (calc) (ref 1.0–2.5)
ALT: 19 U/L (ref 6–29)
AST: 25 U/L (ref 10–35)
Albumin: 3.9 g/dL (ref 3.6–5.1)
Alkaline phosphatase (APISO): 81 U/L (ref 37–153)
BUN: 14 mg/dL (ref 7–25)
CO2: 28 mmol/L (ref 20–32)
Calcium: 9.1 mg/dL (ref 8.6–10.4)
Chloride: 102 mmol/L (ref 98–110)
Creat: 0.65 mg/dL (ref 0.60–1.00)
Globulin: 2.2 g/dL (calc) (ref 1.9–3.7)
Glucose, Bld: 107 mg/dL — ABNORMAL HIGH (ref 65–99)
Potassium: 4.1 mmol/L (ref 3.5–5.3)
Sodium: 138 mmol/L (ref 135–146)
Total Bilirubin: 0.5 mg/dL (ref 0.2–1.2)
Total Protein: 6.1 g/dL (ref 6.1–8.1)
eGFR: 92 mL/min/{1.73_m2} (ref >=60)

## 2020-08-24 NOTE — Telephone Encounter (Signed)
Refill to be sent after labs result.

## 2020-08-24 NOTE — Telephone Encounter (Signed)
Patient here for labs today. Patient request refill on Imuran, and asks that it be sent into [facility] instead of local pharmacy.

## 2020-08-27 ENCOUNTER — Other Ambulatory Visit: Payer: Self-pay

## 2020-08-27 MED ORDER — AZATHIOPRINE 50 MG PO TABS: 100.0000 mg | ORAL_TABLET | Freq: Every day | ORAL | 0 refills | Status: DC

## 2020-08-27 NOTE — Telephone Encounter (Signed)
Next Visit: 10/23/2020  Last Visit: 07/13/2020  Last Fill: 07/24/2020  DX: Rheumatoid arthritis involving multiple sites with positive rheumatoid factor   Current Dose per office note 07/13/2020: Imuran at 50 mg p.o. daily for 2 weeks and then increase to 200 mg p.o. daily.  She will get labs 2 weeks x 2 and then every 2 months to monitor for drug toxicity.   Labs: 08/24/2020 Absolute eosinophils remain elevated. WBC count WBC.  Hgb and hct remain borderline elevated but stable. Rest of CBC WNL.  Glucose is 107.  Rest of CMP WNL.  Continue on current treatment regimen.   Per last lab note patient has increased to Imuran 100 mg daily.   Okay to refill Imuran?

## 2020-08-27 NOTE — Telephone Encounter (Signed)
A [facility] but patient is completely out of medication. Patient requested a 2 week supply to be sent to the local pharmacy.   Next Visit: 10/23/2020  Last Visit: 07/13/2020  DX: Rheumatoid arthritis involving multiple sites with positive rheumatoid factor  Current Dose per office note on 07/13/2020: start her on Imuran at 50 mg p.o. daily for 2 weeks and then increase to 200 mg p.o. daily.  Labs: 08/24/2020 Absolute eosinophils remain elevated. WBC count WBC.  Hgb and hct remain borderline elevated but stable. Rest of CBC WNL.  Glucose is 107.  Rest of CMP WNL.  Continue on current treatment regimen.   Okay to refill 2 week supply of imuran?

## 2020-08-27 NOTE — Progress Notes (Signed)
Absolute eosinophils remain elevated. WBC count WBC.  Hgb and hct remain borderline elevated but stable. Rest of CBC WNL.  Glucose is 107.  Rest of CMP WNL.  Continue on current treatment regimen.

## 2020-08-28 ENCOUNTER — Encounter (HOSPITAL_COMMUNITY)
Admission: RE | Admit: 2020-08-28 | Discharge: 2020-08-28 | Disposition: A | Discharge status: Home / Self Care | Payer: Medicare Other | Source: Ambulatory Visit | Attending: Internal Medicine | Admitting: Internal Medicine

## 2020-08-28 ENCOUNTER — Telehealth (HOSPITAL_COMMUNITY): Payer: Self-pay | Admitting: *Deleted

## 2020-08-28 NOTE — Telephone Encounter (Signed)
[name] called today and states that she has her granddaughter that she is caring for. Her husband is suppose to get home to take care of her. If he doesn't arrive in time she will not be able to come to class today. Explained that we understood and if she doesn't make it we will see her Thursday.

## 2020-08-30 ENCOUNTER — Encounter (HOSPITAL_COMMUNITY)
Admission: RE | Admit: 2020-08-30 | Discharge: 2020-08-30 | Disposition: A | Discharge status: Home / Self Care | Payer: Medicare Other | Source: Ambulatory Visit | Attending: Internal Medicine | Admitting: Internal Medicine

## 2020-08-30 ENCOUNTER — Other Ambulatory Visit: Payer: Self-pay

## 2020-08-30 DIAGNOSIS — J849 Interstitial pulmonary disease, unspecified: Secondary | ICD-10-CM | POA: Diagnosis not present

## 2020-08-30 NOTE — Progress Notes (Signed)
Daily Session Note  Patient Details  Name: Deanna Norris MRN: [id number] Date of Birth: 06/28/1945 Referring Provider:   [name] Pulmonary Rehab Walk Test from 07/06/2020 in [place]  Referring Provider Dr. [name]       Encounter Date: 08/30/2020  Check In:  Session Check In - 08/30/20 1433       Check-In   Supervising physician immediately available to respond to emergencies [facility] immediately available    Physician(s) Dr. Sloan Leiter    Location [facility]    Staff Present Elmon Else, MS, ACSM-CEP, Exercise Physiologist;Lisa Jani Gravel, MS, ACSM-CEP, Exercise Physiologist    Virtual Visit No    Medication changes reported     No    Fall or balance concerns reported    No    Tobacco Cessation No Change    Warm-up and Cool-down Performed as group-led instruction    Resistance Training Performed Yes    VAD Patient? No      Pain Assessment   Currently in Pain? No/denies    Pain Score 0-No pain    Multiple Pain Sites No             Capillary Blood Glucose: No results found for this or any previous visit (from the past 24 hour(s)).    Social History   Tobacco Use  Smoking Status Never  Smokeless Tobacco Never    Goals Met:  Proper associated with RPD/PD & O2 Sat Exercise tolerated well No report of concerns or symptoms today Strength training completed today  Goals Unmet:  Not Applicable  Comments: Service time is from 7737 to [place]    Dr. [name] is Medical Director for Cardiac Rehab at [facility].

## 2020-09-03 DIAGNOSIS — Z85820 Personal history of malignant melanoma of skin: Secondary | ICD-10-CM | POA: Diagnosis not present

## 2020-09-03 DIAGNOSIS — D1801 Hemangioma of skin and subcutaneous tissue: Secondary | ICD-10-CM | POA: Diagnosis not present

## 2020-09-03 DIAGNOSIS — L812 Freckles: Secondary | ICD-10-CM | POA: Diagnosis not present

## 2020-09-03 DIAGNOSIS — L218 Other seborrheic dermatitis: Secondary | ICD-10-CM | POA: Diagnosis not present

## 2020-09-03 DIAGNOSIS — L821 Other seborrheic keratosis: Secondary | ICD-10-CM | POA: Diagnosis not present

## 2020-09-04 ENCOUNTER — Other Ambulatory Visit: Payer: Self-pay

## 2020-09-04 ENCOUNTER — Encounter (HOSPITAL_COMMUNITY)
Admission: RE | Admit: 2020-09-04 | Discharge: 2020-09-04 | Disposition: A | Discharge status: Home / Self Care | Payer: Medicare Other | Source: Ambulatory Visit | Attending: Internal Medicine | Admitting: Internal Medicine

## 2020-09-04 DIAGNOSIS — J849 Interstitial pulmonary disease, unspecified: Secondary | ICD-10-CM | POA: Diagnosis not present

## 2020-09-04 NOTE — Progress Notes (Signed)
Daily Session Note  Patient Details  Name: Deanna Norris MRN: [id number] Date of Birth: 1945-09-01 Referring Provider:   [name] Pulmonary Rehab Walk Test from 07/06/2020 in [place]  Referring Provider Dr. [name]       Encounter Date: 09/04/2020  Check In:  Session Check In - 09/04/20 1501       Check-In   Supervising physician immediately available to respond to emergencies [facility] immediately available    Physician(s) Dr. Doristine Bosworth    [facility]    Staff Present Seward Carol, MS, ACSM CEP, Exercise Physiologist;Kaylee [name], MS, ACSM-CEP, Exercise Physiologist;Lisa [name], RN;Shereda Graw Leonia Reeves, RN, BSN    Virtual Visit No    Medication changes reported     No    Fall or balance concerns reported    No    Tobacco Cessation No Change    Warm-up and Cool-down Performed as group-led instruction    Resistance Training Performed Yes    VAD Patient? No    PAD/SET Patient? No      Pain Assessment   Currently in Pain? No/denies    Multiple Pain Sites No             Capillary Blood Glucose: No results found for this or any previous visit (from the past 24 hour(s)).    Social History   Tobacco Use  Smoking Status Never  Smokeless Tobacco Never    Goals Met:  Proper associated with RPD/PD & O2 Sat Exercise tolerated well No report of concerns or symptoms today Strength training completed today  Goals Unmet:  Not Applicable  Comments: Service time is from 7672 to 1430    Dr. [name] is Medical Director for Cardiac Rehab at [facility].

## 2020-09-06 ENCOUNTER — Encounter (HOSPITAL_COMMUNITY)
Admission: RE | Admit: 2020-09-06 | Discharge: 2020-09-06 | Disposition: A | Discharge status: Home / Self Care | Payer: Medicare Other | Source: Ambulatory Visit | Attending: Internal Medicine | Admitting: Internal Medicine

## 2020-09-06 ENCOUNTER — Other Ambulatory Visit: Payer: Self-pay

## 2020-09-06 DIAGNOSIS — J849 Interstitial pulmonary disease, unspecified: Secondary | ICD-10-CM | POA: Insufficient documentation

## 2020-09-06 NOTE — Progress Notes (Addendum)
I have reviewed a Home Exercise Prescription with [name] . [name] is currently exercising at home.  The patient walk outside 4 non-rehab days/wk for 30 minutes.  [name] and I discussed how to progress their exercise prescription. 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 I advised her start with 1 day of resistance training and go from there. I also said she could switch out one 1 non-rehab days for a resistance training day. I told [name] I would give her a dumbbell resistance training packet with basic exercised she could start with. The patient stated that their goals were to be healthy, keep up with her grandchildren, and lose weight. The patient stated that they understand the exercise prescription.  We reviewed exercise guidelines, target heart rate during exercise, RPE Scale, weather conditions, endpoints for exercise, warmup and cool down.  Patient is encouraged to come to me with any questions. I will continue to follow up with the patient to assist them with progression and safety.    Sheppard Plumber, MS, ACSM-CEP 09/06/2020 3:11 PM

## 2020-09-06 NOTE — Progress Notes (Signed)
Daily Session Note  Patient Details  Name: MAELIE CHRISWELL MRN: [id number] Date of Birth: 06/16/1945 Referring Provider:   [name] Pulmonary Rehab Walk Test from 07/06/2020 in [place]  Referring Provider Dr. [name]       Encounter Date: 09/06/2020  Check In:  Session Check In - 09/06/20 1453       Check-In   Location [facility]    Staff Present [name], RN, Quentin Ore, MS, ACSM-CEP, Exercise Physiologist;Yehuda Printup [name], RN    Virtual Visit No    Medication changes reported     No    Fall or balance concerns reported    No    Tobacco Cessation No Change    Warm-up and Cool-down Performed as group-led instruction    Resistance Training Performed Yes    VAD Patient? No    PAD/SET Patient? No      Pain Assessment   Currently in Pain? No/denies    Multiple Pain Sites No             Capillary Blood Glucose: No results found for this or any previous visit (from the past 24 hour(s)).    Social History   Tobacco Use  Smoking Status Never  Smokeless Tobacco Never    Goals Met:  Exercise tolerated well No report of concerns or symptoms today Strength training completed today  Goals Unmet:  Not Applicable  Comments: Service time is from 1323 to 78    Dr. [name] is Medical Director for Cardiac Rehab at [facility].

## 2020-09-11 ENCOUNTER — Other Ambulatory Visit: Payer: Self-pay

## 2020-09-11 ENCOUNTER — Encounter (HOSPITAL_COMMUNITY)
Admission: RE | Admit: 2020-09-11 | Discharge: 2020-09-11 | Disposition: A | Discharge status: Home / Self Care | Payer: Medicare Other | Source: Ambulatory Visit | Attending: Internal Medicine | Admitting: Internal Medicine

## 2020-09-11 DIAGNOSIS — J849 Interstitial pulmonary disease, unspecified: Secondary | ICD-10-CM

## 2020-09-11 NOTE — Progress Notes (Signed)
Daily Session Note  Patient Details  Name: Deanna Norris MRN: [id number] Date of Birth: 08/30/1945 Referring Provider:   [name] Pulmonary Rehab Walk Test from 07/06/2020 in [place]  Referring Provider Dr. [name]       Encounter Date: 09/11/2020  Check In:  Session Check In - 09/11/20 1443       Check-In   Supervising physician immediately available to respond to emergencies [facility] immediately available    Physician(s) Dr. Doristine Bosworth    Location [facility]    Staff Present [name], RN, BSN;Lisa [name], Cathleen Fears, MS, ACSM-CEP, Exercise Physiologist    Virtual Visit No    Medication changes reported     No    Fall or balance concerns reported    No    Tobacco Cessation No Change    Warm-up and Cool-down Performed as group-led instruction    Resistance Training Performed Yes    VAD Patient? No    PAD/SET Patient? No      Pain Assessment   Currently in Pain? No/denies    Multiple Pain Sites No             Capillary Blood Glucose: No results found for this or any previous visit (from the past 24 hour(s)).   Exercise Prescription Changes - 09/11/20 1500       Response to Exercise   Blood Pressure (Admit) 128/74    Blood Pressure (Exercise) 112/70    Blood Pressure (Exit) 112/68    Heart Rate (Admit) 64 bpm    Heart Rate (Exercise) 70 bpm    Heart Rate (Exit) 64 bpm    Oxygen Saturation (Admit) 95 %    Oxygen Saturation (Exercise) 90 %    Oxygen Saturation (Exit) 96 %    Rating of Perceived Exertion (Exercise) 9    Perceived Dyspnea (Exercise) 1    Duration Continue with 30 min of aerobic exercise without signs/symptoms of physical distress.    Intensity THRR unchanged      Progression   Progression Continue to progress workloads to maintain intensity without signs/symptoms of physical distress.      Resistance Training   Training Prescription Yes    Weight Red bands     Reps 10-15    Time 10 Minutes      NuStep   Level 4    SPM 80    Minutes 15    METs 2.7      Track   Laps 21    Minutes 15             Social History   Tobacco Use  Smoking Status Never  Smokeless Tobacco Never    Goals Met:  Proper associated with RPD/PD & O2 Sat Using PLB without cueing & demonstrates good technique Exercise tolerated well No report of concerns or symptoms today Strength training completed today  Goals Unmet:  Not Applicable  Comments: Service time is from 1325 to 1433    Dr. [name] is Medical Director for Cardiac Rehab at [facility].

## 2020-09-13 ENCOUNTER — Encounter (HOSPITAL_COMMUNITY)
Admission: RE | Admit: 2020-09-13 | Discharge: 2020-09-13 | Disposition: A | Discharge status: Home / Self Care | Payer: Medicare Other | Source: Ambulatory Visit | Attending: Internal Medicine | Admitting: Internal Medicine

## 2020-09-13 ENCOUNTER — Other Ambulatory Visit: Payer: Self-pay

## 2020-09-13 VITALS — Wt 181.9 lb

## 2020-09-13 DIAGNOSIS — J849 Interstitial pulmonary disease, unspecified: Secondary | ICD-10-CM | POA: Diagnosis not present

## 2020-09-13 NOTE — Progress Notes (Signed)
Daily Session Note  Patient Details  Name: Deanna Norris MRN: [id number] Date of Birth: May 25, 1945 Referring Provider:   [name] Pulmonary Rehab Walk Test from 07/06/2020 in [place]  Referring Provider Dr. [name]       Encounter Date: 09/13/2020  Check In:  Session Check In - 09/13/20 1449       Check-In   Supervising physician immediately available to respond to emergencies [facility] immediately available    Physician(s) Dr. Florene Glen    Location [facility]    Staff Present [name], RN, Quentin Ore, MS, ACSM-CEP, Exercise Physiologist;Lisa [name], RN    Virtual Visit Yes    Medication changes reported     No    Fall or balance concerns reported    No    Tobacco Cessation No Change    Warm-up and Cool-down Not performed (comment)    Resistance Training Performed No    VAD Patient? No    PAD/SET Patient? No      Pain Assessment   Currently in Pain? No/denies    Multiple Pain Sites No             Capillary Blood Glucose: No results found for this or any previous visit (from the past 24 hour(s)).    Social History   Tobacco Use  Smoking Status Never  Smokeless Tobacco Never    Goals Met:  Proper associated with RPD/PD & O2 Sat Independence with exercise equipment Exercise tolerated well No report of concerns or symptoms today  Goals Unmet:  Not Applicable  Comments: Service time is from 1300 to 1322. Pt completed 6 MWT today and graduated the program.     Dr. [name] is Medical Director for Cardiac Rehab at [facility].

## 2020-09-20 NOTE — Progress Notes (Signed)
Discharge Progress Report  Patient Details  Name: Deanna Norris MRN: [id number] Date of Birth: Jul 31, 1945 Referring Provider:   [name] Pulmonary Rehab Walk Test from 07/06/2020 in [place]  Referring Provider Dr. [name]        Number of Visits: 16  Reason for Discharge:  Patient reached a stable level of exercise. Patient independent in their exercise. Patient has met program and personal goals.  Smoking History:  Social History   Tobacco Use  Smoking Status Never  Smokeless Tobacco Never    Diagnosis:  ILD (interstitial lung disease) ([place])  ADL [facility]:  Pulmonary Assessment Scores     Row Name 07/06/20 1538 09/13/20 1527       ADL [facility]SD   ADL Phase Entry Exit    SOB Score total -- 31         CAT Score   CAT Score 12 13         mMRC Score   mMRC Score 2 2             Initial Exercise Prescription:  Initial Exercise Prescription - 07/06/20 1500       Date of Initial Exercise RX and Referring Provider   Date 07/06/20    Referring Provider Dr. [name]    Expected Discharge Date 09/06/20      NuStep   Level 2    SPM 70    Minutes 15      Track   Minutes 15      Prescription Details   Frequency (times per week) 2    Duration Progress to 30 minutes of continuous aerobic without signs/symptoms of physical distress      Intensity   THRR 40-80% of Max Heartrate 58-117    Ratings of Perceived Exertion 11-13    Perceived Dyspnea 0-4      Progression   Progression Continue to progress workloads to maintain intensity without signs/symptoms of physical distress.      Resistance Training   Training Prescription Yes    Weight Red bands    Reps 10-15             Discharge Exercise Prescription (Final Exercise Prescription Changes):  Exercise Prescription Changes - 09/11/20 1500       Response to Exercise   Blood Pressure (Admit) 128/74    Blood Pressure (Exercise) 112/70    Blood  Pressure (Exit) 112/68    Heart Rate (Admit) 64 bpm    Heart Rate (Exercise) 70 bpm    Heart Rate (Exit) 64 bpm    Oxygen Saturation (Admit) 95 %    Oxygen Saturation (Exercise) 90 %    Oxygen Saturation (Exit) 96 %    Rating of Perceived Exertion (Exercise) 9    Perceived Dyspnea (Exercise) 1    Duration Continue with 30 min of aerobic exercise without signs/symptoms of physical distress.    Intensity THRR unchanged      Progression   Progression Continue to progress workloads to maintain intensity without signs/symptoms of physical distress.      Resistance Training   Training Prescription Yes    Weight Red bands    Reps 10-15    Time 10 Minutes      NuStep   Level 4    SPM 80    Minutes 15    METs 2.7      Track   Laps 21    Minutes 15  Functional Capacity:  6 Minute Walk     Row Name 07/06/20 1524 09/13/20 1518       6 Minute Walk   Phase Initial Discharge    Distance 1447 feet 2000 feet    Distance % Change -- 38.22 %    Distance Feet Change -- 553 ft    Walk Time 6 minutes 6 minutes    # of Rest Breaks 0 0    MPH 2.74 3.79    METS 2.77 3.61    RPE 11 11    Perceived Dyspnea  2 1    VO2 Peak 9.68 12.63    Symptoms No No    Resting HR 59 bpm 67 bpm    Resting BP 124/88 116/70    Resting Oxygen Saturation  97 % 95 %    Exercise Oxygen Saturation  during 6 min walk 94 % 90 %    Max Ex. HR 83 bpm 78 bpm    Max Ex. BP 138/78 130/64    2 Minute Post BP 124/70 114/68         Interval HR   1 Minute HR 75 71    2 Minute HR 80 71    3 Minute HR 87 71    4 Minute HR 83 70    5 Minute HR 83 68    6 Minute HR 83 78    2 Minute Post HR 50 68    Interval Heart Rate? Yes Yes         Interval Oxygen   Interval Oxygen? Yes Yes    Baseline Oxygen Saturation % 97 % 95 %    1 Minute Oxygen Saturation % 95 % 96 %    1 Minute Liters of Oxygen 0 L 0 L    2 Minute Oxygen Saturation % 94 % 91 %    2 Minute Liters of Oxygen 0 L 0 L    3 Minute  Oxygen Saturation % 94 % 90 %    3 Minute Liters of Oxygen 0 L 0 L    4 Minute Oxygen Saturation % 95 % 94 %    4 Minute Liters of Oxygen 0 L 0 L    5 Minute Oxygen Saturation % 94 % 92 %    5 Minute Liters of Oxygen 0 L 0 L    6 Minute Oxygen Saturation % 94 % 94 %    6 Minute Liters of Oxygen 0 L 0 L    2 Minute Post Oxygen Saturation % 97 % 98 %    2 Minute Post Liters of Oxygen 0 L 0 L             Psychological, QOL, Others - Outcomes: PHQ 2/9: Depression screen [facility] 2/9 09/13/2020 07/06/2020 07/02/2020 06/16/2019 04/08/2018  Decreased Interest 0 0 0 0 0  Down, Depressed, Hopeless 0 0 0 0 0  PHQ - 2 Score 0 0 0 0 0  Altered sleeping 2 0 1 0 0  Tired, decreased energy _0 0 0  Change in appetite 1 0 1 0 0  Feeling bad or failure about yourself  0 0 0 0 0  Trouble concentrating 0 0 0 0 0  Moving slowly or fidgety/restless - - 0 0 0  Suicidal thoughts 0 0 0 0 0  PHQ-9 Score _1 0 0  Difficult doing work/chores - - - Not difficult at all Not difficult at all  Some recent  data might be hidden    Quality of Life:   Personal Goals: Goals established at orientation with interventions provided to work toward goal.  Personal Goals and Risk Factors at Admission - 07/06/20 1534       Core Components/Risk Factors/Patient Goals on Admission   Expected Outcomes Long Term: Adherence to nutrition and physical activity/exercise program aimed toward attainment of established weight goal;Weight Loss: Understanding of general recommendations for a balanced deficit meal plan, which promotes 1-2 lb weight loss per week and includes a negative energy balance of [phone] kcal/d;Short Term: Continue to assess and modify interventions until short term weight is achieved;Understanding of distribution of calorie intake throughout the day with the consumption of 4-5 meals/snacks    Improve shortness of breath with ADL's Yes    Intervention Provide education, individualized exercise plan and daily  activity instruction to help decrease symptoms of SOB with activities of daily living.    Expected Outcomes Short Term: Improve cardiorespiratory fitness to achieve a reduction of symptoms when performing ADLs;Long Term: Be able to perform more ADLs without symptoms or delay the onset of symptoms              Personal Goals Discharge:  Goals and Risk Factor Review     Row Name 07/23/20 1340 08/20/20 1443           Core Components/Risk Factors/Patient Goals Review   Personal Goals Review Develop more efficient breathing techniques such as purse lipped breathing and diaphragmatic breathing and practicing self-pacing with activity.;Increase knowledge of respiratory medications and ability to use respiratory devices properly.;Improve shortness of breath with ADL's Increase knowledge of respiratory medications and ability to use respiratory devices properly.;Develop more efficient breathing techniques such as purse lipped breathing and diaphragmatic breathing and practicing self-pacing with activity.;Improve shortness of breath with ADL's      Review Deanna Norris is high functioning for a pulmonary rehab patient.  She has attended 2 weeks of exercise classes and is already walking almost a mile in 15 minutes and 2.4 mets on the nustep.  She is also learning safe and appropriate guideline for exercise. Deanna Norris is exercising at a high level, she is walking almosat a mile in 15 minutes and level 4 of the nustep @ 3 mets.  She has increased her strength and stamina while participating in pulmonary rehab.      Expected Outcomes See admission goals. See addmission.               Exercise Goals and Review:  Exercise Goals     Row Name 07/06/20 1523 08/21/20 0950           Exercise Goals   Increase Physical Activity Yes Yes      Intervention Provide advice, education, support and counseling about physical activity/exercise needs.;Develop an individualized exercise prescription for aerobic and  resistive training based on initial evaluation findings, risk stratification, comorbidities and participant's personal goals. Provide advice, education, support and counseling about physical activity/exercise needs.;Develop an individualized exercise prescription for aerobic and resistive training based on initial evaluation findings, risk stratification, comorbidities and participant's personal goals.      Expected Outcomes Short Term: Attend rehab on a regular basis to increase amount of physical activity.;Long Term: Add in home exercise to make exercise part of routine and to increase amount of physical activity.;Long Term: Exercising regularly at least 3-5 days a week. Short Term: Attend rehab on a regular basis to increase amount of physical activity.;Long Term: Add in home exercise to  make exercise part of routine and to increase amount of physical activity.;Long Term: Exercising regularly at least 3-5 days a week.      Increase Strength and Stamina Yes Yes      Intervention Provide advice, education, support and counseling about physical activity/exercise needs.;Develop an individualized exercise prescription for aerobic and resistive training based on initial evaluation findings, risk stratification, comorbidities and participant's personal goals. Provide advice, education, support and counseling about physical activity/exercise needs.;Develop an individualized exercise prescription for aerobic and resistive training based on initial evaluation findings, risk stratification, comorbidities and participant's personal goals.      Expected Outcomes Short Term: Increase workloads from initial exercise prescription for resistance, speed, and METs.;Short Term: Perform resistance training exercises routinely during rehab and add in resistance training at home;Long Term: Improve cardiorespiratory fitness, muscular endurance and strength as measured by increased METs and functional capacity (6MWT) Short Term:  Increase workloads from initial exercise prescription for resistance, speed, and METs.;Short Term: Perform resistance training exercises routinely during rehab and add in resistance training at home;Long Term: Improve cardiorespiratory fitness, muscular endurance and strength as measured by increased METs and functional capacity (6MWT)      Able to understand and use rate of perceived exertion (RPE) scale Yes Yes      Intervention Provide education and explanation on how to use RPE scale Provide education and explanation on how to use RPE scale      Expected Outcomes Short Term: Able to use RPE daily in rehab to express subjective intensity level;Long Term:  Able to use RPE to guide intensity level when exercising independently Short Term: Able to use RPE daily in rehab to express subjective intensity level;Long Term:  Able to use RPE to guide intensity level when exercising independently      Able to understand and use Dyspnea scale Yes Yes      Intervention Provide education and explanation on how to use Dyspnea scale Provide education and explanation on how to use Dyspnea scale      Expected Outcomes Short Term: Able to use Dyspnea scale daily in rehab to express subjective sense of shortness of breath during exertion;Long Term: Able to use Dyspnea scale to guide intensity level when exercising independently Short Term: Able to use Dyspnea scale daily in rehab to express subjective sense of shortness of breath during exertion;Long Term: Able to use Dyspnea scale to guide intensity level when exercising independently      Knowledge and understanding of Target Heart Rate Range (THRR) Yes Yes      Intervention Provide education and explanation of THRR including how the numbers were predicted and where they are located for reference Provide education and explanation of THRR including how the numbers were predicted and where they are located for reference      Expected Outcomes Short Term: Able to state/look  up THRR;Short Term: Able to use daily as guideline for intensity in rehab;Long Term: Able to use THRR to govern intensity when exercising independently Short Term: Able to state/look up THRR;Short Term: Able to use daily as guideline for intensity in rehab;Long Term: Able to use THRR to govern intensity when exercising independently      Understanding of Exercise Prescription Yes Yes      Intervention Provide education, explanation, and written materials on patient's individual exercise prescription Provide education, explanation, and written materials on patient's individual exercise prescription      Expected Outcomes Short Term: Able to explain program exercise prescription;Long Term: Able to explain  home exercise prescription to exercise independently Short Term: Able to explain program exercise prescription;Long Term: Able to explain home exercise prescription to exercise independently               Exercise Goals Re-Evaluation:  Exercise Goals Re-Evaluation     Row Name 07/23/20 1617 08/21/20 0953           Exercise Goal Re-Evaluation   Exercise Goals Review Increase Physical Activity;Increase Strength and Stamina;Able to understand and use rate of perceived exertion (RPE) scale;Able to understand and use Dyspnea scale;Knowledge and understanding of Target Heart Rate Range (THRR);Understanding of Exercise Prescription Increase Physical Activity;Increase Strength and Stamina;Able to understand and use rate of perceived exertion (RPE) scale;Able to understand and use Dyspnea scale;Knowledge and understanding of Target Heart Rate Range (THRR);Understanding of Exercise Prescription      Comments Deanna Norris has completed 4 exercise sessions and has tolerated well so far. She has already made workload increases in the short time that she has been in the program. She is exercising at 2.4 METS on the Nustep and is walking 21 laps in 15 minutes. Will continue to monitor and progress as she is able.  Deanna Norris has completed 8 exercise sessions and has tolerated well. She has progressed on the Nustep and track. Deanna Norris has averaged 3.0 METS on the Nustep and walked 26 laps in 15 minutes. She performs the warm up and resistance exercises/cool down standing without limitations. Will continue to monitor and progress as she is able.      Expected Outcomes Through exercise at rehab and home, the patient will decrease shortness of breath with daily activities and feel confident in carrying out an exercise regimn at home. Through exercise at rehab and home, the patient will decrease shortness of breath with daily activities and feel confident in carrying out an exercise regimn at home.               Nutrition & Weight - Outcomes:  Pre Biometrics - 07/06/20 1524       Pre Biometrics   Grip Strength 18 kg             Post Biometrics - 09/13/20 1523        Post  Biometrics   Weight 82.5 kg    Grip Strength 19.5 kg             Nutrition:  Nutrition Therapy & Goals - 07/17/20 1457       Nutrition Therapy   Diet General Healthful      Personal Nutrition Goals   Nutrition Goal Pt to identify food quantities necessary to achieve weight loss of 6-24 lb at graduation from cardiac rehab.    Personal Goal #2 Pt to build a healthy plate including vegetables, fruits, whole grains, and low-fat dairy products in a heart healthy meal plan.      Intervention Plan   Intervention Prescribe, educate and counsel regarding individualized specific dietary modifications aiming towards targeted core components such as weight, hypertension, lipid management, diabetes, heart failure and other comorbidities.;Nutrition handout(s) given to patient.    Expected Outcomes Short Term Goal: A plan has been developed with personal nutrition goals set during dietitian appointment.;Long Term Goal: Adherence to prescribed nutrition plan.             Nutrition Discharge:   Education Questionnaire  Score:  Knowledge Questionnaire Score - 09/13/20 1527       Knowledge Questionnaire Score   Post Score 15/18  Goals reviewed with patient; copy given to patient.

## 2020-10-08 ENCOUNTER — Other Ambulatory Visit: Payer: Self-pay

## 2020-10-08 DIAGNOSIS — Z79899 Other long term (current) drug therapy: Secondary | ICD-10-CM

## 2020-10-08 NOTE — Telephone Encounter (Signed)
Patient called requesting prescription refill of Orencia to be sent to [facility].

## 2020-10-08 NOTE — Telephone Encounter (Signed)
Next Visit: 10/23/2020   Last Visit: 07/13/2020   Last Fill: 07/13/2020  DX: Rheumatoid arthritis involving multiple sites with positive rheumatoid factor    Current Dose per office note 07/13/2020:Orencia 125 mg sq injections every 7 days  Labs: 08/24/2020 Absolute eosinophils remain elevated. WBC count WBC.  Hgb and hct remain borderline elevated but stable. Rest of CBC WNL.  Glucose is 107.  Rest of CMP WNL.   TB Gold: 03/16/2020 Neg

## 2020-10-09 MED ORDER — ORENCIA 125 MG/ML ~~LOC~~ SOSY: PREFILLED_SYRINGE | SUBCUTANEOUS | 2 refills | Status: DC

## 2020-10-09 NOTE — Progress Notes (Signed)
Office Visit Note  Patient: Deanna Norris             Date of Birth: 08/31/1945           MRN: [id number]             PCP: Abner Greenspan, MD Referring: Tower, [name], MD Visit Date: 10/23/2020 Occupation: _0 @  Subjective:  Discuss ILD   History of Present Illness: Deanna Norris is a 75 y.o. female with history of seropositive rheumatoid arthritis, ILD, osteoporosis, and osteoarthritis.  She is on Orencia 125 mg sq injections once weekly and Imuran 50 mg 2 tablets by mouth daily.  She is tolerating both medications without any side effects.  She continues to experience intermittent discomfort in both shoulders and both knee joints but denies any joint swelling.  She was recently referred to pulmonary rehab by Dr. [name] for management of ILD and has noticed significant improvement in her symptoms as well as awareness of the disease.  She has been walking 30 minutes on a daily basis for exercise and has noticed some increased discomfort in her knee joints.  She noticed improvement in her knee joint pain after undergoing Visco gel injections in April 2022.  She would like to reapply for Visco gel injections for both knees.  She denies any new joint swelling at this time. She has noticed some increased reflux and is concerned that it may worsen her pulmonary disease.  She continues to take Pepcid on a daily basis for symptomatic relief.  She plans on trying to change her diet.  She has ongoing faint rash on bilateral upper extremities and her torso.  She was evaluated by her dermatologist and was diagnosed with eczema.  She was given a prescription for triamcinolone cream which she tried using once or twice but her symptoms have been mild and the rash seems to be improving so she discontinued the use of triamcinolone. Her most recent IV Reclast infusion was on 08/08/2020.  She will be due to update her DEXA in February 2023.    Activities of Daily Living:  Patient reports  morning stiffness for 10 minutes.   Patient Reports nocturnal pain.  Difficulty dressing/grooming: Denies Difficulty climbing stairs: Reports Difficulty getting out of chair: Reports Difficulty using hands for taps, buttons, cutlery, and/or writing: Denies  Review of Systems  Constitutional:  Positive for fatigue.  HENT:  Positive for mouth dryness.   Eyes:  Positive for dryness.  Respiratory:  Positive for shortness of breath.   Cardiovascular:  Negative for swelling in legs/feet.  Gastrointestinal:  Positive for constipation.  Endocrine: Positive for excessive thirst.  Genitourinary:  Negative for difficulty urinating.  Musculoskeletal:  Positive for joint pain, gait problem, joint pain, morning stiffness and muscle tenderness.  Skin:  Positive for rash.  Allergic/Immunologic: Negative for susceptible to infections.  Neurological:  Positive for weakness.  Hematological:  Negative for bruising/bleeding tendency.  Psychiatric/Behavioral:  Positive for sleep disturbance.    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   Primary osteoarthritis of both hands 01/11/2016   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/11/2016   High risk medication use 11/15/2015  Sjogren's syndrome ([place]) 10/31/2015   Chronic constipation 10/23/2015   Rosacea 10/23/2015   Rheumatoid arthritis ([place]) 03/29/2015   Coronary artery calcification seen on CT scan 11/17/2013   Recurrent sinusitis 09/16/2013   Encounter for Medicare annual wellness exam 01/31/2013   Dyspnea on exertion 09/14/2012   Nodule of right lung 09/14/2012   Hyperlipidemia  05/08/2009   Tear film insufficiency 05/08/2009   Asthma 05/08/2009   GERD 05/08/2009   MENOPAUSAL DISORDER 05/08/2009   ARTHRITIS, RHEUMATOID 05/08/2009   Osteoporosis 05/08/2009   SPONDYLOLISTHESIS 05/08/2009   Sleep apnea 05/08/2009    Past Medical History:  Diagnosis Date   Asthma    Constipation    Fibroid    GERD (gastroesophageal reflux disease)    Insomnia    Interstitial lung disease (HCC)    Lumbar vertebral fracture (HCC)    L5-S1   Menopausal symptoms    Osteoporosis    Postmenopausal HRT (hormone replacement therapy)    RA (rheumatoid arthritis) ([place]) 11/2004   methotrexate and Embrel   Rosacea    Sjogren's disease ([place])    Sleep apnea 2008   C-Pap   Vitamin D deficiency     Family History  Problem Relation Age of Onset   Breast cancer Sister 43   Thyroid disease Sister    Cancer Sister 23       Leukemia--CLL   Heart disease Brother    Past Surgical History:  Procedure Laterality Date   ABDOMINAL HYSTERECTOMY  01/1993   partial with AP repair, secondary to fibroids   BREAST SURGERY  1991   breast biopsy   BRONCHIAL WASHINGS  06/07/2020   Procedure: BRONCHIAL WASHINGS;  Surgeon: Brand Males, MD;  Location: [facility];  Service: Endoscopy;;   CYSTOCELE REPAIR  10/2007   with graft   MELANOMA EXCISION Right 01/2012   excision right posterior arm   NASAL SINUS SURGERY  07/1991   VAGINAL DELIVERY     x3   VIDEO BRONCHOSCOPY N/A 06/07/2020   Procedure: VIDEO BRONCHOSCOPY WITHOUT FLUORO;  Surgeon: Brand Males, MD;  Location: [facility];  Service: Endoscopy;  Laterality: N/A;   Social History   Social History Narrative   Not on file   Immunization History  Administered Date(s) Administered   DTaP 04/06/2013   Influenza Split 10/22/2010   Influenza Whole 10/10/2009, 10/20/2011   Influenza, High Dose Seasonal PF 10/14/2016, 10/08/2018, 10/07/2019   Influenza, Seasonal, Injecte, Preservative Fre 10/06/2012   Influenza,inj,Quad PF,6+ Mos  10/30/2014   Influenza,inj,quad, With Preservative 10/12/2017   Influenza-Unspecified 10/26/2013, 10/22/2015   PFIZER(Purple Top)SARS-COV-2 Vaccination 06/02/2019, 08/26/2019   Pneumococcal Conjugate-13 02/13/2014   Pneumococcal Polysaccharide-23 06/12/2010, 03/28/2015   Td 02/07/2003, 03/23/2013   Tdap 03/23/2013   Zoster, Live 05/08/2014     Objective: Vital Signs: BP (!) 144/67 (BP Location: Left Arm, Patient Position: Sitting, Cuff Size: Normal)   Pulse (!) 59   Resp 16   Ht _0  (1.676 m)   Wt 183 lb (83 kg)   LMP 01/06/1993   BMI 29.54 kg/m    Physical Exam Vitals and nursing note reviewed.  Constitutional:      Appearance: She is well-developed.  HENT:     Head: Normocephalic and atraumatic.  Eyes:     Conjunctiva/sclera: Conjunctivae normal.  Pulmonary:     Effort: Pulmonary effort is normal.  Abdominal:     Palpations: Abdomen is soft.  Musculoskeletal:     Cervical back: Normal range of motion.  Skin:    General:  Skin is warm and dry.     Capillary Refill: Capillary refill takes less than 2 seconds.  Neurological:     Mental Status: She is alert and oriented to person, place, and time.  Psychiatric:        Behavior: Behavior normal.     Musculoskeletal Exam: C-spine has good ROM with no discomfort.  Shoulder joints have good ROM with some discomfort bilaterally.  Elbow joints, wrist joints, MCPs, PIPs, and DIPs good ROM with no synovitis.  CMC joint thickening and prominence bilaterally.  PIP and DIP thickening consistent with OA of both hands.  Synovial thickening over MCPs but no tenderness or synovitis noted.  Hip joints have good ROM with no discomfort.  Knee joints have good ROM with no discomfort.  No warmth or effusion of knee joints.  Ankle joints have good ROM with no tenderness or joint swelling.   CDAI Exam: CDAI Score: 0.6  Patient Global: 3 mm; Provider Global: 3 mm Swollen: 0 ; Tender: 0  Joint Exam 10/23/2020   No joint exam has been  documented for this visit   There is currently no information documented on the homunculus. Go to the Rheumatology activity and complete the homunculus joint exam.  Investigation: No additional findings.  Imaging: No results found.  Recent Labs: Lab Results  Component Value Date   WBC 8.6 10/19/2020   HGB 16.5 (H) 10/19/2020   PLT 278 10/19/2020   NA 140 10/19/2020   K 4.6 10/19/2020   CL 103 10/19/2020   CO2 28 10/19/2020   GLUCOSE 119 (H) 10/19/2020   BUN 13 10/19/2020   CREATININE 0.75 10/19/2020   BILITOT 0.5 10/19/2020   ALKPHOS 72 06/18/2020   AST 22 10/19/2020   ALT 17 10/19/2020   PROT 6.3 10/19/2020   ALBUMIN 4.0 06/18/2020   CALCIUM 9.7 10/19/2020   GFRAA 100 06/22/2020   QFTBGOLDPLUS NEGATIVE 03/16/2020    Speciality Comments: Prior therapy: Plaquenil (allergy), Humira and Enbrel (inadequate response)  Reclast: 06/19/16, 06/29/17, 07/08/2018, 07/20/19 DEXA 02/07/19  Procedures:  No procedures performed Allergies: Adalimumab, Diclofenac, Dicyclomine, Doxycycline hyclate, Enbrel [etanercept], Fosamax [alendronate sodium], Methotrexate derivatives, Nickel, Piroxicam, Salagen [pilocarpine hcl], Sulfamethoxazole-trimethoprim, Tramadol hcl, and Plaquenil [hydroxychloroquine]  Humira>>enbrel>>orencia       Assessment / Plan:     Visit Diagnoses: Rheumatoid arthritis involving multiple sites with positive rheumatoid factor (HCC) - +RF, +CCP, ANA-,severe erosive: She has no joint tenderness or synovitis on examination today.  She has not had any signs or symptoms of the wrists and rheumatoid arthritis flare.  She is clinically doing well on Orencia 125 mg sq injections once weekly and Imuran 100 mg daily.  She has been tolerating Imuran without any side effects.  She has not noticed any new or worsening symptoms since switching from methotrexate to Imuran.  She recently completed pulmonary rehab as recommended by Dr. [name] as part of the management for ILD.  She has  noticed improvement in her stamina and has been walking 30 minutes daily for exercise.  She will remain on her current treatment regimen.  She was advised to notify us if she develops increased joint pain or joint swelling.  Discussed that we will continue to follow along with her closely.  She will follow-up in the office in 3 months.  High risk medication use - Orencia 125 mg sq injections every 7 days and Imuran 100 mg daily. (previously on MTX and folic acid). CBC and CMP were updated on 10/19/2020.  Results were  reviewed with the patient today in the office.  She will be due to update lab work in January and every 3 months to monitor for drug toxicity.  Standing orders for CBC and CMP are in place.  TB Gold negative on 03/16/2020 and will continue to be monitored yearly. Discussed the importance of holding Orencia and Imuran if she develops signs or symptoms of an infection and to resume once the infection has completely cleared.  Sicca syndrome ([place]): She has ongoing sicca symptoms which have been manageable overall.  ILD (interstitial lung disease) ([place]) - Evaluated by Dr. [name] and diagnosed with mild ILD.  High-resolution chest CT 03/05/2020 revealed very subtle changes suggestive of ILD redemonstrated with minimal progression compared to prior study.  She recently completed pulmonary rehab as recommended by Dr. [name].  She has noticed improvements in her stamina and has a better understanding of ILD since completing therapy.  She has been walking 30 minutes on a daily basis for exercise she will continue on Imuran 100 mg daily as prescribed.  She was advised to notify us if she develops any new or worsening symptoms.  She will be following up with Dr. [name] on 01/11/2021.  She will return to our office shortly after her visit with Dr. [name].  Primary osteoarthritis of both hands: She has CMC joint thickening and prominence noted bilaterally.  PIP and DIP thickening consistent with  osteoarthritis of both hands noted.  She experiences intermittent pain and stiffness in both hands which has been tolerable.  No inflammation was noted on examination today.  Discussed the importance of joint protection and muscle strengthening.  Primary osteoarthritis of both knees - X-rays of both knees were updated on 03/28/2019 which were consistent with moderate to severe osteoarthritis and severe chondromalacia patella.  She has good range of motion of both knee joints on examination today with no warmth or effusion.  She experiences some discomfort when walking long distances as well as when climbing steps.  She underwent Visco gel injections in both knees in March/April 2022 which provided significant pain relief.  She would like to reapply for Visco gel injections for both knees.  Primary osteoarthritis of both feet: She has not been experiencing any increased discomfort in her feet recently.  She is good range of motion of both ankle joints with no tenderness or inflammation.  No tenderness over MTP joints.  DDD (degenerative disc disease), lumbar: She is not experiencing any increased lower back pain at this time.  No midline spinal tenderness or SI joint tenderness noted.  Age-related osteoporosis without current pathological fracture - DEXA 02/07/2019 Lowest T-score and site measured: Left Femoral Neck -2.9. Her most recent Reclast infusion was on 08/08/2020.  She continues to take a calcium and vitamin D supplement daily.  She has not had any recent falls or fractures.  Her next DEXA will be due in February 2023 so a new order will be placed at her follow-up visit in 3 months.  History of gastroesophageal reflux (GERD): She has been experiencing worsening symptoms of reflux despite taking Pepcid on a daily basis.  Discussed dietary modifications to try.  She was also encouraged to discontinue turmeric to see if this may be contributing to her symptoms.  Other medical conditions are listed as  follows:  History of rosacea: Followed by dermatology  Abnormal SPEP: Multiple myeloma panel was obtained on 10/13/2017 which revealed an apparent normal immunofixation pattern.  History of sleep apnea - She uses a CPAP nightly.  History of hyperlipidemia   Orders: No orders of the defined types were placed in this encounter.  Meds ordered this encounter  Medications   azaTHIOprine (IMURAN) 50 MG tablet    Sig: Take 2 tablets (100 mg total) by mouth daily.    Dispense:  180 tablet    Refill:  0      Follow-Up Instructions: Return in about 3 months (around 01/23/2021) for Rheumatoid arthritis, ILD, Osteoporosis, Osteoarthritis.   Ofilia Neas, PA-C  Note - This record has been created using Dragon software.  Chart creation errors have been sought, but may not always  have been located. Such creation errors do not reflect on  the standard of medical care.

## 2020-10-19 ENCOUNTER — Other Ambulatory Visit: Payer: Self-pay

## 2020-10-19 DIAGNOSIS — Z79899 Other long term (current) drug therapy: Secondary | ICD-10-CM | POA: Diagnosis not present

## 2020-10-20 LAB — COMPLETE METABOLIC PANEL WITH GFR
AG Ratio: 1.7 (calc) (ref 1.0–2.5)
ALT: 17 U/L (ref 6–29)
AST: 22 U/L (ref 10–35)
Albumin: 4 g/dL (ref 3.6–5.1)
Alkaline phosphatase (APISO): 69 U/L (ref 37–153)
BUN: 13 mg/dL (ref 7–25)
CO2: 28 mmol/L (ref 20–32)
Calcium: 9.7 mg/dL (ref 8.6–10.4)
Chloride: 103 mmol/L (ref 98–110)
Creat: 0.75 mg/dL (ref 0.60–1.00)
Globulin: 2.3 g/dL (calc) (ref 1.9–3.7)
Glucose, Bld: 119 mg/dL — ABNORMAL HIGH (ref 65–99)
Potassium: 4.6 mmol/L (ref 3.5–5.3)
Sodium: 140 mmol/L (ref 135–146)
Total Bilirubin: 0.5 mg/dL (ref 0.2–1.2)
Total Protein: 6.3 g/dL (ref 6.1–8.1)
eGFR: 83 mL/min/{1.73_m2} (ref >=60)

## 2020-10-20 LAB — CBC WITH DIFFERENTIAL/PLATELET
Absolute Monocytes: 774 cells/uL (ref 200–950)
Basophils Absolute: 129 cells/uL (ref 0–200)
Basophils Relative: 1.5 %
Eosinophils Absolute: 576 cells/uL — ABNORMAL HIGH (ref 15–500)
Eosinophils Relative: 6.7 %
HCT: 48.9 % — ABNORMAL HIGH (ref 35.0–45.0)
Hemoglobin: 16.5 g/dL — ABNORMAL HIGH (ref 11.7–15.5)
Lymphs Abs: 2683 cells/uL (ref 850–3900)
MCH: 32.2 pg (ref 27.0–33.0)
MCHC: 33.7 g/dL (ref 32.0–36.0)
MCV: 95.3 fL (ref 80.0–100.0)
MPV: 11.1 fL (ref 7.5–12.5)
Monocytes Relative: 9 %
Neutro Abs: 4438 cells/uL (ref 1500–7800)
Neutrophils Relative %: 51.6 %
Platelets: 278 10*3/uL (ref 140–400)
RBC: 5.13 10*6/uL — ABNORMAL HIGH (ref 3.80–5.10)
RDW: 13.4 % (ref 11.0–15.0)
Total Lymphocyte: 31.2 %
WBC: 8.6 10*3/uL (ref 3.8–10.8)

## 2020-10-22 NOTE — Progress Notes (Signed)
Glucose is 119. Rest of CMP WNL.  RBC, hgb, and hct are slightly elevated but stable. Absolute eosinophils are borderline elevated but trending down.  We will continue to monitor lab work closely.

## 2020-10-23 ENCOUNTER — Other Ambulatory Visit: Payer: Self-pay

## 2020-10-23 ENCOUNTER — Encounter: Payer: Self-pay | Admitting: Physician Assistant

## 2020-10-23 ENCOUNTER — Ambulatory Visit (INDEPENDENT_AMBULATORY_CARE_PROVIDER_SITE_OTHER): Payer: Medicare Other | Admitting: Physician Assistant

## 2020-10-23 VITALS — BP 144/67 | HR 59 | Resp 16 | Ht 66.0 in | Wt 183.0 lb

## 2020-10-23 DIAGNOSIS — M5136 Other intervertebral disc degeneration, lumbar region: Secondary | ICD-10-CM

## 2020-10-23 DIAGNOSIS — M19041 Primary osteoarthritis, right hand: Secondary | ICD-10-CM

## 2020-10-23 DIAGNOSIS — M170 Bilateral primary osteoarthritis of knee: Secondary | ICD-10-CM

## 2020-10-23 DIAGNOSIS — M3500 Sicca syndrome, unspecified: Secondary | ICD-10-CM | POA: Diagnosis not present

## 2020-10-23 DIAGNOSIS — M19042 Primary osteoarthritis, left hand: Secondary | ICD-10-CM

## 2020-10-23 DIAGNOSIS — M19071 Primary osteoarthritis, right ankle and foot: Secondary | ICD-10-CM

## 2020-10-23 DIAGNOSIS — M810 Age-related osteoporosis without current pathological fracture: Secondary | ICD-10-CM | POA: Diagnosis not present

## 2020-10-23 DIAGNOSIS — Z872 Personal history of diseases of the skin and subcutaneous tissue: Secondary | ICD-10-CM | POA: Diagnosis not present

## 2020-10-23 DIAGNOSIS — Z79899 Other long term (current) drug therapy: Secondary | ICD-10-CM

## 2020-10-23 DIAGNOSIS — Z8639 Personal history of other endocrine, nutritional and metabolic disease: Secondary | ICD-10-CM

## 2020-10-23 DIAGNOSIS — Z8719 Personal history of other diseases of the digestive system: Secondary | ICD-10-CM

## 2020-10-23 DIAGNOSIS — R778 Other specified abnormalities of plasma proteins: Secondary | ICD-10-CM

## 2020-10-23 DIAGNOSIS — M51369 Other intervertebral disc degeneration, lumbar region without mention of lumbar back pain or lower extremity pain: Secondary | ICD-10-CM

## 2020-10-23 DIAGNOSIS — H524 Presbyopia: Secondary | ICD-10-CM | POA: Diagnosis not present

## 2020-10-23 DIAGNOSIS — H52223 Regular astigmatism, bilateral: Secondary | ICD-10-CM | POA: Diagnosis not present

## 2020-10-23 DIAGNOSIS — Z8669 Personal history of other diseases of the nervous system and sense organs: Secondary | ICD-10-CM

## 2020-10-23 DIAGNOSIS — M0579 Rheumatoid arthritis with rheumatoid factor of multiple sites without organ or systems involvement: Secondary | ICD-10-CM | POA: Diagnosis not present

## 2020-10-23 DIAGNOSIS — H35363 Drusen (degenerative) of macula, bilateral: Secondary | ICD-10-CM | POA: Diagnosis not present

## 2020-10-23 DIAGNOSIS — M19072 Primary osteoarthritis, left ankle and foot: Secondary | ICD-10-CM

## 2020-10-23 DIAGNOSIS — H5203 Hypermetropia, bilateral: Secondary | ICD-10-CM | POA: Diagnosis not present

## 2020-10-23 DIAGNOSIS — H04123 Dry eye syndrome of bilateral lacrimal glands: Secondary | ICD-10-CM | POA: Diagnosis not present

## 2020-10-23 DIAGNOSIS — H25813 Combined forms of age-related cataract, bilateral: Secondary | ICD-10-CM | POA: Diagnosis not present

## 2020-10-23 DIAGNOSIS — J849 Interstitial pulmonary disease, unspecified: Secondary | ICD-10-CM

## 2020-10-23 MED ORDER — AZATHIOPRINE 50 MG PO TABS: 100.0000 mg | ORAL_TABLET | Freq: Every day | ORAL | 0 refills | Status: DC

## 2020-10-23 NOTE — Patient Instructions (Signed)
Standing Labs We placed an order today for your standing lab work.   Please have your standing labs drawn in January and every 3 months   If possible, please have your labs drawn 2 weeks prior to your appointment so that the provider can discuss your results at your appointment.  Please note that you may see your imaging and lab results in MyChart before we have reviewed them. We may be awaiting multiple results to interpret others before contacting you. Please allow our office up to 72 hours to thoroughly review all of the results before contacting the office for clarification of your results.  We have open lab daily: Monday through Thursday from 1:30-4:30 PM and Friday from 1:30-4:00 PM at the office of Dr. Shaili Deveshwar, [facility].   Please be advised, all patients with office appointments requiring lab work will take precedent over walk-in lab work.  If possible, please come for your lab work on Monday and Friday afternoons, as you may experience shorter wait times. The office is located at [address], [address], [place], [place] [address] No appointment is necessary.   Labs are drawn by [facility]. Please bring your co-pay at the time of your lab draw.  You may receive a bill from [facility] for your lab work.  If you wish to have your labs drawn at another location, please call the office 24 hours in advance to send orders.  If you have any questions regarding directions or hours of operation,  please call [phone].   As a reminder, please drink plenty of water prior to coming for your lab work. Thanks!  

## 2020-11-05 ENCOUNTER — Telehealth: Payer: Self-pay | Admitting: Rheumatology

## 2020-11-05 NOTE — Telephone Encounter (Signed)
Opened in error

## 2020-11-07 DIAGNOSIS — G4733 Obstructive sleep apnea (adult) (pediatric): Secondary | ICD-10-CM | POA: Diagnosis not present

## 2020-11-09 ENCOUNTER — Telehealth: Payer: Self-pay | Admitting: Family Medicine

## 2020-11-09 DIAGNOSIS — H9193 Unspecified hearing loss, bilateral: Secondary | ICD-10-CM

## 2020-11-09 DIAGNOSIS — H9319 Tinnitus, unspecified ear: Secondary | ICD-10-CM | POA: Insufficient documentation

## 2020-11-09 DIAGNOSIS — H9313 Tinnitus, bilateral: Secondary | ICD-10-CM

## 2020-11-09 NOTE — Telephone Encounter (Signed)
Dr. Glori Bickers signed the referral info sheet that prints when providers do a referral. I did fax that paper to [facility] as requested but will route to the referral pool so they can still send the official referral to that office

## 2020-11-09 NOTE — Addendum Note (Signed)
Addended by: Loura Pardon A on: 11/09/2020 12:29 PM   Modules accepted: Orders

## 2020-11-09 NOTE — Telephone Encounter (Signed)
Pt called stating that she needs a referral to [facility] for a hearing evaluation ATTN: [facility] fax [phone]. Pt states that it has to be signed by Dr.Tower with her NPI# and name hand printed. Pt states that the cover sheet needs to have the office name and address and phone number and also the pt name and DOB and the reason which is for Tinnitus and Reduce hearing.

## 2020-11-09 NOTE — Telephone Encounter (Signed)
Printout is in IN box  Please add what is needed and fax Thanks

## 2020-11-12 NOTE — Telephone Encounter (Signed)
Called [facility] for Deanna Norris to make sure this has been received. Awaiting call back

## 2020-11-12 NOTE — Telephone Encounter (Signed)
Michelene Heady returning call

## 2020-11-27 ENCOUNTER — Encounter: Payer: Self-pay | Admitting: Family Medicine

## 2020-11-27 ENCOUNTER — Other Ambulatory Visit: Payer: Self-pay

## 2020-11-27 ENCOUNTER — Telehealth (INDEPENDENT_AMBULATORY_CARE_PROVIDER_SITE_OTHER): Payer: Medicare Other | Admitting: Family Medicine

## 2020-11-27 DIAGNOSIS — Z20828 Contact with and (suspected) exposure to other viral communicable diseases: Secondary | ICD-10-CM | POA: Insufficient documentation

## 2020-11-27 MED ORDER — OSELTAMIVIR PHOSPHATE 75 MG PO CAPS: 75.0000 mg | ORAL_CAPSULE | Freq: Every day | ORAL | 0 refills | Status: DC

## 2020-11-27 NOTE — Assessment & Plan Note (Signed)
Exposed to child with influenza about 5 days ago  Feels ok /scant runny nose  Will watch closely for symptoms and update Pt has h/o ILD and RA on immunicomp medications High risk for flu  Is immunized (oct) Px tamiflu 75 mg to take daily for 10 d  If symptomatic would inc to bid (she will let us know) Info given  Adv to keep up fluids and stay rested

## 2020-11-27 NOTE — Progress Notes (Signed)
Virtual Visit via Video Note  I connected with [name] B Faulstich on 11/27/20 at 12:00 PM EST by a video enabled telemedicine application and verified that I am speaking with the correct person using two identifiers.  Location: Patient: home Provider: office   I discussed the limitations of evaluation and management by telemedicine and the availability of in person appointments. The patient expressed understanding and agreed to proceed.  Parties involved in encounter  Patient: Deanna Norris  Provider:  Loura Pardon MD   History of Present Illness: Pt presents with c/o exposure to influenza  She has interstitial lung dz  Daughter was at home -her kids have type A flu Interested in prophylaxis  Was exposed early on - over the weekend   She has a few sniffles   One child with the flu  Other kids were over it   No fever Just sniffles No cough  No more fatigue than usual      Imm status : had flu shot in oct    Budesonide inhaler Azathiioprine 100 mg daily Proair Orencia for RA  (off metx and getting used to this)   Patient Active Problem List   Diagnosis Date Noted   Exposure to the flu 11/27/2020   Tinnitus 11/09/2020   Preoperative respiratory examination 06/07/2020   ILD (interstitial lung disease) ([place])    Hearing loss 07/10/2019   Allergic rhinitis 04/08/2018   Immunocompromised state ([place]) 02/17/2018   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   Primary osteoarthritis of both hands 01/11/2016   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/11/2016   High risk medication use 11/15/2015   Sjogren's syndrome ([place]) 10/31/2015   Chronic constipation 10/23/2015   Rosacea 10/23/2015   Rheumatoid arthritis ([place]) 03/29/2015   Coronary artery calcification seen on CT scan 11/17/2013   Recurrent sinusitis 09/16/2013   Encounter for Medicare annual wellness  exam 01/31/2013   Dyspnea on exertion 09/14/2012   Nodule of right lung 09/14/2012   Hyperlipidemia 05/08/2009   Tear film insufficiency 05/08/2009   Asthma 05/08/2009   GERD 05/08/2009   MENOPAUSAL DISORDER 05/08/2009   ARTHRITIS, RHEUMATOID 05/08/2009   Osteoporosis 05/08/2009   SPONDYLOLISTHESIS 05/08/2009   Sleep apnea 05/08/2009   Past Medical History:  Diagnosis Date   Asthma    Constipation    Fibroid    GERD (gastroesophageal reflux disease)    Insomnia    Interstitial lung disease ([place])    Lumbar vertebral fracture ([facility])    L5-S1   Menopausal symptoms    Osteoporosis    Postmenopausal HRT (hormone replacement therapy)    RA (rheumatoid arthritis) ([place]) 11/2004   methotrexate and Embrel   Rosacea    Sjogren's disease ([place])    Sleep apnea 2008   C-Pap   Vitamin D deficiency    Past Surgical History:  Procedure Laterality Date   ABDOMINAL HYSTERECTOMY  01/1993   partial with AP repair, secondary to fibroids   BREAST SURGERY  1991   breast biopsy   BRONCHIAL WASHINGS  06/07/2020   Procedure: BRONCHIAL WASHINGS;  Surgeon: Brand Males, MD;  Location: [facility];  Service: Endoscopy;;   CYSTOCELE REPAIR  10/2007   with graft   MELANOMA EXCISION Right 01/2012   excision right posterior arm   NASAL SINUS SURGERY  07/1991   VAGINAL DELIVERY     x3   VIDEO BRONCHOSCOPY N/A 06/07/2020   Procedure: VIDEO BRONCHOSCOPY WITHOUT FLUORO;  Surgeon: Brand Males, MD;  Location: [facility];  Service: Endoscopy;  Laterality: N/A;   Social History   Tobacco Use   Smoking status: Never   Smokeless tobacco: Never  Vaping Use   Vaping Use: Never used  Substance Use Topics   Alcohol use: No    Alcohol/week: 0.0 standard drinks   Drug use: No   Family History  Problem Relation Age of Onset   Breast cancer Sister 15   Thyroid disease Sister    Cancer Sister 32       Leukemia--CLL   Heart disease Brother    Allergies  Allergen Reactions   Adalimumab      REACTION: frequent sinus infection   Diclofenac     unk   Dicyclomine     Turn teeth gray   Doxycycline Hyclate Other (See Comments)    "turns teeth gray"   Enbrel [Etanercept]     SINUS INFECTIONS   Fosamax [Alendronate Sodium]     Acid reflux   Methotrexate Derivatives Nausea Only    Oral metotrexate   Nickel    Piroxicam     unk   Salagen [Pilocarpine Hcl] Other (See Comments)    Excessive sweating, headaches   Sulfamethoxazole-Trimethoprim     REACTION: face and hands rash and felt burning sensation   Tramadol Hcl     REACTION: nausea   Plaquenil [Hydroxychloroquine] Rash   Current Outpatient Medications on File Prior to Visit  Medication Sig Dispense Refill   acetaminophen (TYLENOL) 500 MG tablet Take 500-1,000 mg by mouth every 8 (eight) hours as needed for moderate pain.     albuterol (PROAIR HFA) 108 (90 Base) MCG/ACT inhaler Inhale 2 puffs into the lungs every 4 (four) hours as needed for wheezing or shortness of breath. 18 g 3   Ascorbic Acid (VITAMIN C PO) Take 500 mg by mouth daily.     aspirin 81 MG tablet Take 81 mg by mouth daily.     azaTHIOprine (IMURAN) 50 MG tablet Take 2 tablets (100 mg total) by mouth daily. 180 tablet 0   budesonide (PULMICORT) 180 MCG/ACT inhaler Inhale 1 puff into the lungs daily as needed (asthma).     Calcium Carbonate-Vitamin D (CALCIUM 500/D PO) Take 1 tablet by mouth 2 (two) times daily.     Cholecalciferol (VITAMIN D3) 2000 UNITS TABS Take 2,000 Units by mouth daily.     cycloSPORINE (RESTASIS) 0.05 % ophthalmic emulsion Place 1 drop into both eyes 2 (two) times daily.     desonide (DESOWEN) 0.05 % lotion Apply 1 application topically daily as needed (irritation).     diclofenac Sodium (VOLTAREN) 1 % GEL Apply 2-4 grams to affected joint 4 times daily as needed. (Patient taking differently: Apply 2-4 g topically 4 (four) times daily as needed (pain). Apply 2-4 grams to affected joint 4 times daily as needed.) 400 g 2   Estradiol  (VAGIFEM) 10 MCG TABS vaginal tablet Place 1 tablet (10 mcg total) vaginally 2 (two) times a week. Place one tablet (10 mcg) per vagina at bedtime twice a week. 24 tablet 3   famotidine (PEPCID) 40 MG tablet Take 1 tablet (40 mg total) by mouth 2 (two) times daily. 180 tablet 3   fluocinonide (LIDEX) 0.05 % external solution Apply topically.     fluticasone (FLONASE) 50 MCG/ACT nasal spray USE 2 SPRAYS IN EACH NOSTRIL DAILY AS NEEDED FOR RHINITIS OR ALLERGIES (Patient taking differently: Place 2 sprays into both nostrils daily as needed for  allergies. USE 2 SPRAYS IN EACH NOSTRIL DAILY AS NEEDED FOR RHINITIS OR ALLERGIES) 48 g 3   ibuprofen (ADVIL) 200 MG tablet Take 200-400 mg by mouth every 8 (eight) hours as needed (pain).     Magnesium Malate 1250 (141.7 Mg) MG TABS Take 3 tablets by mouth daily.      metroNIDAZOLE (METROCREAM) 0.75 % cream Apply 1 application topically 2 (two) times daily as needed (rosacea).     Multiple Vitamin (MULTI-VITAMINS) TABS Take 1 tablet by mouth daily.     Omega-3 1000 MG CAPS Take 1,000 mg by mouth daily.     ORENCIA 125 MG/ML SOSY INJECT THE CONTENTS OF 1 SYRINGE UNDER THE SKIN EVERY WEEK 4 mL 2   Propylene Glycol (SYSTANE BALANCE) 0.6 % SOLN Place 1 drop into both eyes daily as needed (dry eyes).     triamcinolone cream (KENALOG) 0.1 % SMARTSIG:1 Application Topical 2-3 Times Daily     Zinc 50 MG TABS Take 50 mg by mouth daily.     zoledronic acid (RECLAST) 5 MG/100ML SOLN injection Inject 5 mg into the vein once. Takes yearly     zolpidem (AMBIEN) 10 MG tablet Take 0.5 tablets (5 mg total) by mouth at bedtime as needed for sleep. 30 tablet 2   No current facility-administered medications on file prior to visit.    Review of Systems  Constitutional:  Negative for chills, fever and malaise/fatigue.  HENT:  Negative for congestion, ear pain, sinus pain and sore throat.        Mild runny nose  Eyes:  Negative for blurred vision, discharge and redness.   Respiratory:  Negative for cough, shortness of breath and stridor.   Cardiovascular:  Negative for chest pain, palpitations and leg swelling.  Gastrointestinal:  Negative for abdominal pain, diarrhea, nausea and vomiting.  Musculoskeletal:  Negative for myalgias.  Skin:  Negative for rash.  Neurological:  Negative for dizziness and headaches.   Observations/Objective: Patient appears well, in no distress Weight is baseline  No facial swelling or asymmetry Normal voice-not hoarse and no slurred speech No obvious tremor or mobility impairment Moving neck and UEs normally Able to hear the call well  No cough or shortness of breath during interview  Talkative and mentally sharp with no cognitive changes No skin changes on face or neck , no rash or pallor Affect is normal    Assessment and Plan: Problem List Items Addressed This Visit       Other   Exposure to the flu    Exposed to child with influenza about 5 days ago  Feels ok /scant runny nose  Will watch closely for symptoms and update Pt has h/o ILD and RA on immunicomp medications High risk for flu  Is immunized (oct) Px tamiflu 75 mg to take daily for 10 d  If symptomatic would inc to bid (she will let us know) Info given  Adv to keep up fluids and stay rested         Follow Up Instructions: Try to stay rested and drink fluids Watch for flu symptoms (fever/cough/aches) and let us know  Take tamiflu for prophylaxis 75 mg once daily for 10 days   Wash hands/wear a mask and try to minimize public exposure during the cold and flu season    I discussed the assessment and treatment plan with the patient. The patient was provided an opportunity to ask questions and all were answered. The patient agreed with the plan and demonstrated  an understanding of the instructions.   The patient was advised to call back or seek an in-person evaluation if the symptoms worsen or if the condition fails to improve as  anticipated.     Loura Pardon, MD

## 2020-11-27 NOTE — Patient Instructions (Signed)
Try to stay rested and drink fluids Watch for flu symptoms (fever/cough/aches) and let us know  Take tamiflu for prophylaxis 75 mg once daily for 10 days   Wash hands/wear a mask and try to minimize public exposure during the cold and flu season

## 2020-12-17 NOTE — Telephone Encounter (Signed)
Glga called in stated she receive the referral but it need's a signature electronically or other wise # [phone] and Fax [phone]

## 2020-12-17 NOTE — Telephone Encounter (Signed)
[place] you can print the order and as long as it says "electronically signed by Dr Glori Bickers" then no signature is needed or you can have her sign the Referral/Order. Please fax back to [place] at [phone]

## 2020-12-17 NOTE — Telephone Encounter (Signed)
The referral has been faxed to the [phone] attention Olga///ELEA

## 2020-12-17 NOTE — Telephone Encounter (Signed)
[facility] for a hearing evaluation . Called in requesting to have referral be refax   to the ATTN: [place] fax [phone].

## 2020-12-18 NOTE — Telephone Encounter (Signed)
Dr. Glori Bickers signed referral and I faxed it back to the fax # prev listed

## 2021-01-03 ENCOUNTER — Other Ambulatory Visit: Payer: Self-pay | Admitting: Rheumatology

## 2021-01-03 DIAGNOSIS — Z79899 Other long term (current) drug therapy: Secondary | ICD-10-CM

## 2021-01-03 MED ORDER — ORENCIA 125 MG/ML ~~LOC~~ SOSY: PREFILLED_SYRINGE | SUBCUTANEOUS | 2 refills | Status: DC

## 2021-01-03 NOTE — Telephone Encounter (Signed)
Patinent called the office requesting a refill of Orencia 125 mg[facility]ml [facility]

## 2021-01-03 NOTE — Telephone Encounter (Signed)
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 Rest of CMP WNL.  RBC, hgb, and hct are slightly elevated but stable. Absolute eosinophils are borderline elevated but trending down  TB Gold: 03/16/2020 Neg   Okay to refill Orencia?

## 2021-01-08 ENCOUNTER — Ambulatory Visit (HOSPITAL_COMMUNITY): Payer: Medicare Other | Attending: Cardiovascular Disease

## 2021-01-08 ENCOUNTER — Other Ambulatory Visit: Payer: Self-pay

## 2021-01-08 DIAGNOSIS — R0602 Shortness of breath: Secondary | ICD-10-CM | POA: Insufficient documentation

## 2021-01-08 LAB — ECHOCARDIOGRAM COMPLETE
Area-P 1/2: 3.47 cm2
P 1/2 time: 712 msec
S' Lateral: 3 cm

## 2021-01-10 ENCOUNTER — Ambulatory Visit (INDEPENDENT_AMBULATORY_CARE_PROVIDER_SITE_OTHER): Payer: Medicare Other | Admitting: Internal Medicine

## 2021-01-10 ENCOUNTER — Other Ambulatory Visit: Payer: Self-pay

## 2021-01-10 DIAGNOSIS — J849 Interstitial pulmonary disease, unspecified: Secondary | ICD-10-CM

## 2021-01-10 LAB — PULMONARY FUNCTION TEST
DL/VA % pred: 98 %
DL/VA: 3.96 ml/min/mmHg/L
DLCO cor % pred: 85 %
DLCO cor: 17.4 ml/min/mmHg
DLCO unc % pred: 85 %
DLCO unc: 17.4 ml/min/mmHg
FEF 25-75 Pre: 1.42 L/sec
FEF2575-%Pred-Pre: 80 %
FEV1-%Pred-Pre: 80 %
FEV1-Pre: 1.86 L
FEV1FVC-%Pred-Pre: 100 %
FEV6-%Pred-Pre: 83 %
FEV6-Pre: 2.43 L
FEV6FVC-%Pred-Pre: 104 %
FVC-%Pred-Pre: 80 %
FVC-Pre: 2.47 L
Pre FEV1/FVC ratio: 75 %
Pre FEV6/FVC Ratio: 100 %

## 2021-01-10 NOTE — Progress Notes (Signed)
Spirometry and Dlco done today. 

## 2021-01-10 NOTE — Progress Notes (Signed)
Office Visit Note  Patient: Deanna Norris             Date of Birth: 07/12/1945           MRN: [id number]             PCP: Abner Greenspan, MD Referring: Tower, [name], MD Visit Date: 01/22/2021 Occupation: @GUAROCC @  Subjective:  Increased pain in joints.   History of Present Illness: [name] is a 76 y.o. female with a history of rheumatoid arthritis, ILD and osteoporosis.  She was recently evaluated by Dr. [name].  He felt that her ILD is worsening and she may need Ofev.  Patient states that since she switched from Imuran to methotrexate she has been experiencing increased joint pain and discomfort.  She describes discomfort in her shoulders, hands, knees and her feet.  She discussed joint pain with Dr. [name] who suggested that she can go back to methotrexate as Imuran is not helping her lungs.  Denies any lower back discomfort.  She had been on Reclast on a regular basis for the last 5 years.  Her last infusion was on August 08, 2020.  Last bone density was on February 07, 2019.  She has been taking calcium and vitamin D on a regular basis.  Activities of Daily Living:  Patient reports morning stiffness for 5-10 minutes.   Patient Reports nocturnal pain.  Difficulty dressing/grooming: Denies Difficulty climbing stairs: Reports Difficulty getting out of chair: Reports Difficulty using hands for taps, buttons, cutlery, and/or writing: Reports  Review of Systems  Constitutional:  Positive for fatigue.  HENT:  Positive for mouth dryness. Negative for mouth sores and nose dryness.   Eyes:  Positive for itching and dryness.  Respiratory:  Positive for shortness of breath and difficulty breathing.   Cardiovascular:  Negative for chest pain and palpitations.  Gastrointestinal:  Positive for blood in stool, constipation and heartburn. Negative for diarrhea.  Endocrine: Negative for increased urination.  Genitourinary:  Negative for difficulty urinating.   Musculoskeletal:  Positive for joint pain, joint pain and morning stiffness. Negative for joint swelling, myalgias, muscle tenderness and myalgias.  Skin:  Negative for color change, rash and sensitivity to sunlight.  Allergic/Immunologic: Negative for susceptible to infections.  Neurological:  Positive for weakness. Negative for dizziness, numbness, headaches and memory loss.  Hematological:  Negative for bruising/bleeding tendency.  Psychiatric/Behavioral:  Positive for sleep disturbance. Negative for depressed mood and confusion. The patient is not nervous/anxious.    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   Primary osteoarthritis of both hands 01/11/2016   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/11/2016   High risk medication use 11/15/2015   Sjogren's syndrome ([place]) 10/31/2015   Chronic constipation 10/23/2015   Rosacea 10/23/2015   Rheumatoid arthritis ([place]) 03/29/2015   Coronary artery calcification seen on CT scan 11/17/2013   Recurrent sinusitis 09/16/2013   Encounter for Medicare annual wellness exam 01/31/2013   Dyspnea on exertion 09/14/2012   Nodule of right lung 09/14/2012   Hyperlipidemia 05/08/2009   Tear film insufficiency 05/08/2009   Asthma 05/08/2009  GERD 05/08/2009   MENOPAUSAL DISORDER 05/08/2009   ARTHRITIS, RHEUMATOID 05/08/2009   Osteoporosis 05/08/2009   SPONDYLOLISTHESIS 05/08/2009   Sleep apnea 05/08/2009    Past Medical History:  Diagnosis Date   Asthma    Constipation    Fibroid    GERD (gastroesophageal reflux  disease)    Insomnia    Interstitial lung disease (HCC)    Lumbar vertebral fracture (HCC)    L5-S1   Menopausal symptoms    Osteoporosis    Postmenopausal HRT (hormone replacement therapy)    RA (rheumatoid arthritis) ([place]) 11/2004   methotrexate and Embrel   Rosacea    Sjogren's disease ([place])    Sleep apnea 2008   C-Pap   Vitamin D deficiency     Family History  Problem Relation Age of Onset   Breast cancer Sister 74   Thyroid disease Sister    Cancer Sister 66       Leukemia--CLL   Heart disease Brother    Past Surgical History:  Procedure Laterality Date   ABDOMINAL HYSTERECTOMY  01/1993   partial with AP repair, secondary to fibroids   BREAST SURGERY  1991   breast biopsy   BRONCHIAL WASHINGS  06/07/2020   Procedure: BRONCHIAL WASHINGS;  Surgeon: Brand Males, MD;  Location: [facility];  Service: Endoscopy;;   CYSTOCELE REPAIR  10/2007   with graft   MELANOMA EXCISION Right 01/2012   excision right posterior arm   NASAL SINUS SURGERY  07/1991   VAGINAL DELIVERY     x3   VIDEO BRONCHOSCOPY N/A 06/07/2020   Procedure: VIDEO BRONCHOSCOPY WITHOUT FLUORO;  Surgeon: Brand Males, MD;  Location: [facility];  Service: Endoscopy;  Laterality: N/A;   Social History   Social History Narrative   Not on file   Immunization History  Administered Date(s) Administered   DTaP 04/06/2013   Influenza Split 10/22/2010   Influenza Whole 10/10/2009, 10/20/2011   Influenza, High Dose Seasonal PF 10/14/2016, 10/08/2018, 10/07/2019   Influenza, Seasonal, Injecte, Preservative Fre 10/06/2012   Influenza,inj,Quad PF,6+ Mos 10/30/2014   Influenza,inj,quad, With Preservative 10/12/2017   Influenza-Unspecified 10/26/2013, 10/22/2015, 11/02/2020   PFIZER(Purple Top)SARS-COV-2 Vaccination 06/02/2019, 08/26/2019   Pneumococcal Conjugate-13 02/13/2014   Pneumococcal Polysaccharide-23 06/12/2010, 03/28/2015   Td 02/07/2003, 03/23/2013   Tdap 03/23/2013   Zoster, Live 05/08/2014      Objective: Vital Signs: BP (!) 148/82 (BP Location: Left Arm, Patient Position: Sitting, Cuff Size: Normal)    Pulse 60    Ht 5' 6.5" (1.689 m)    Wt 178 lb 3.2 oz (80.8 kg)    LMP 01/06/1993    BMI 28.33 kg/m    Physical Exam Vitals and nursing note reviewed.  Constitutional:      Appearance: She is well-developed.  HENT:     Head: Normocephalic and atraumatic.  Eyes:     Conjunctiva/sclera: Conjunctivae normal.  Cardiovascular:     Rate and Rhythm: Normal rate and regular rhythm.     Heart sounds: Normal heart sounds.  Pulmonary:     Effort: Pulmonary effort is normal.     Breath sounds: Normal breath sounds.  Abdominal:     General: Bowel sounds are normal.     Palpations: Abdomen is soft.  Musculoskeletal:     Cervical back: Normal range of motion.  Lymphadenopathy:     Cervical: No cervical adenopathy.  Skin:    General: Skin is warm and dry.     Capillary Refill: Capillary refill takes less than 2 seconds.  Neurological:     Mental Status: She is alert and oriented to person, place, and time.  Psychiatric:        Behavior: Behavior normal.     Musculoskeletal Exam: C-spine and shoulder joints with good range of motion.  Elbow joints are in good range of motion.  She had bilateral CMC thickening, bilateral first and second MCP thickening PIP and DIP prominence with no synovitis.  Hip joints and knee joints with good range of motion.  There was no tenderness over ankles or MTPs.  CDAI Exam: CDAI Score: 0.6  Patient Global: 4 mm; Provider Global: 2 mm Swollen: 0 ; Tender: 0  Joint Exam 01/22/2021   No joint exam has been documented for this visit   There is currently no information documented on the homunculus. Go to the Rheumatology activity and complete the homunculus joint exam.  Investigation: No additional findings.  Imaging: CT Chest High Resolution  Result Date: 01/19/2021 CLINICAL DATA:  Interstitial lung disease history of rheumatoid arthritis  EXAM: CT CHEST WITHOUT CONTRAST TECHNIQUE: Multidetector CT imaging of the chest was performed following the standard protocol without intravenous contrast. High resolution imaging of the lungs, as well as inspiratory and expiratory imaging, was performed. RADIATION DOSE REDUCTION: This exam was performed according to the departmental dose-optimization program which includes automated exposure control, adjustment of the mA and/or kV according to patient size and/or use of iterative reconstruction technique. COMPARISON:  03/05/2020, 08/04/2018, 09/20/2013. FINDINGS: Cardiovascular: Aortic atherosclerosis. Normal heart size. Left coronary artery calcifications. No pericardial effusion. Mediastinum/Nodes: No enlarged mediastinal, hilar, or axillary lymph nodes. Mild tracheobronchomalacia on expiratory phase imaging. Thyroid gland and esophagus demonstrate no significant findings. Lungs/Pleura: Unchanged, very mild bibasilar predominant irregular peripheral interstitial opacity and septal thickening without clear evidence of subpleural bronchiolectasis or honeycombing. This is not significantly changed compared to prior examination dated 03/05/2020 and minimally worsened over time on examinations dating back to 09/20/2013. Lobular air trapping on expiratory phase imaging. Unchanged elevation of the right hemidiaphragm. No pleural effusion or pneumothorax. Upper Abdomen: No acute abnormality. Musculoskeletal: No chest wall abnormality. No suspicious osseous lesions identified. IMPRESSION: 1. Very mild bibasilar predominant irregular peripheral interstitial opacity and septal thickening without clear evidence of subpleural bronchiolectasis or honeycombing. This is not significantly changed compared to prior examination dated 03/05/2020 and minimally worsened over time on examinations dating back to 09/20/2013. Findings remain in an early "indeterminate for UIP" pattern of fibrosis per consensus guidelines, differential  considerations including both UIP and NSIP: Diagnosis of Idiopathic Pulmonary Fibrosis: An Official ATS/ERS/JRS/ALAT Clinical Practice Guideline. [place], Iss 5, [phone], Sep 06 2016. 2. Lobular air trapping on expiratory phase imaging, consistent with small airways disease. 3. Mild tracheobronchomalacia on expiratory phase imaging. 4. Coronary artery disease. Aortic Atherosclerosis (ICD10-I70.0). Electronically Signed   By: Delanna Ahmadi M.D.   On: 01/19/2021 20:43   ECHOCARDIOGRAM COMPLETE  Result Date: 01/08/2021    ECHOCARDIOGRAM REPORT   Patient Name:   [name] Date of Exam: 01/08/2021 Medical Rec #:  [id number]          Height:       66.0 in Accession #:    [id number]         Weight:       183.0 lb Date of Birth:  19-Sep-1945         BSA:          1.926 m Patient Age:    13 years  BP:           144/68 mmHg Patient Gender: F                  HR:           62 bpm. Exam Location:  [address] Procedure: 2D Echo, 3D Echo, Cardiac Doppler, Color Doppler and Strain Analysis Indications:    R06.02 Shortness of breath  History:        Patient has prior history of Echocardiogram examinations, most                 recent 12/21/2018. CAD, Signs/Symptoms:Shortness of Breath; Risk                 Factors:Sleep Apnea. Sjogren's syndrome.  Sonographer:    Basilia Jumbo BS, RDCS Referring Phys: Brand Males IMPRESSIONS  1. Left ventricular ejection fraction, by estimation, is 60 to 65%. The left ventricle has normal function. The left ventricle has no regional wall motion abnormalities. Left ventricular diastolic parameters are consistent with Grade I diastolic dysfunction (impaired relaxation). The average left ventricular global longitudinal strain is -24.3 %. The global longitudinal strain is normal.  2. Right ventricular systolic function is normal. The right ventricular size is normal. There is normal pulmonary artery systolic pressure.  3. Left atrial size was mildly  dilated.  4. The mitral valve is normal in structure. Trivial mitral valve regurgitation. No evidence of mitral stenosis.  5. The aortic valve is tricuspid. There is mild calcification of the aortic valve. There is mild thickening of the aortic valve. Aortic valve regurgitation is mild. Aortic valve sclerosis is present, with no evidence of aortic valve stenosis.  6. Aortic dilatation noted. There is mild dilatation of the ascending aorta, measuring 38 mm.  7. The inferior vena cava is normal in size with greater than 50% respiratory variability, suggesting right atrial pressure of 3 mmHg. Comparison(s): 12/21/18 EF 68%. GLS-20.6%. PA pressure 80mmHg. FINDINGS  Left Ventricle: Left ventricular ejection fraction, by estimation, is 60 to 65%. The left ventricle has normal function. The left ventricle has no regional wall motion abnormalities. The average left ventricular global longitudinal strain is -24.3 %. The global longitudinal strain is normal. The left ventricular internal cavity size was normal in size. There is no left ventricular hypertrophy. Left ventricular diastolic parameters are consistent with Grade I diastolic dysfunction (impaired relaxation). Indeterminate filling pressures. Right Ventricle: The right ventricular size is normal. No increase in right ventricular wall thickness. Right ventricular systolic function is normal. There is normal pulmonary artery systolic pressure. The tricuspid regurgitant velocity is 2.62 m/s, and  with an assumed right atrial pressure of 3 mmHg, the estimated right ventricular systolic pressure is 29.5 mmHg. Left Atrium: Left atrial size was mildly dilated. Right Atrium: Right atrial size was normal in size. Pericardium: There is no evidence of pericardial effusion. Mitral Valve: The mitral valve is normal in structure. Trivial mitral valve regurgitation. No evidence of mitral valve stenosis. Tricuspid Valve: The tricuspid valve is normal in structure. Tricuspid valve  regurgitation is mild . No evidence of tricuspid stenosis. Aortic Valve: The aortic valve is tricuspid. There is mild calcification of the aortic valve. There is mild thickening of the aortic valve. Aortic valve regurgitation is mild. Aortic regurgitation PHT measures 712 msec. Aortic valve sclerosis is present,  with no evidence of aortic valve stenosis. Pulmonic Valve: The pulmonic valve was normal in structure. Pulmonic valve regurgitation is not visualized. No evidence of pulmonic stenosis.  Aorta: Aortic dilatation noted. There is mild dilatation of the ascending aorta, measuring 38 mm. Venous: The inferior vena cava is normal in size with greater than 50% respiratory variability, suggesting right atrial pressure of 3 mmHg. IAS/Shunts: No atrial level shunt detected by color flow Doppler.  LEFT VENTRICLE PLAX 2D LVIDd:         4.70 cm   Diastology LVIDs:         3.00 cm   LV e' medial:    8.03 cm/s LV PW:         0.90 cm   LV E/e' medial:  9.5 LV IVS:        0.90 cm   LV e' lateral:   9.00 cm/s LVOT diam:     2.10 cm   LV E/e' lateral: 8.5 LV SV:         92 LV SV Index:   48        2D Longitudinal Strain LVOT Area:     3.46 cm  2D Strain GLS (A2C):   -19.7 %                          2D Strain GLS (A3C):   -30.6 %                          2D Strain GLS (A4C):   -22.5 %                          2D Strain GLS Avg:     -24.3 %                           3D Volume EF:                          3D EF:        61 %                          LV EDV:       145 ml                          LV ESV:       56 ml                          LV SV:        88 ml RIGHT VENTRICLE             IVC RV Basal diam:  3.20 cm     IVC diam: 2.10 cm RV S prime:     16.10 cm/s TAPSE (M-mode): 3.3 cm RVSP:           35.5 mmHg LEFT ATRIUM             Index        RIGHT ATRIUM           Index LA diam:        4.50 cm 2.34 cm/m   RA Pressure: 8.00 mmHg LA Vol (A2C):   61.8 ml 32.09 ml/m  RA Area:     11.70 cm LA Vol (A4C):   57.3 ml 29.76 ml/m  RA  Volume:   23.20 ml  12.05 ml/m LA Biplane Vol: 60.8 ml 31.57 ml/m  AORTIC VALVE LVOT Vmax:   107.00 cm/s LVOT Vmean:  75.300 cm/s LVOT VTI:    0.267 m AI PHT:      712 msec  AORTA Ao Root diam: 3.40 cm Ao Asc diam:  3.80 cm MITRAL VALVE               TRICUSPID VALVE                            TR Peak grad:   27.5 mmHg MV Decel Time: 219 msec    TR Vmax:        262.00 cm/s MV E velocity: 76.45 cm/s  Estimated RAP:  8.00 mmHg MV A velocity: 83.90 cm/s  RVSP:           35.5 mmHg MV E/A ratio:  0.91                            SHUNTS                            Systemic VTI:  0.27 m                            Systemic Diam: 2.10 cm Skeet Latch MD Electronically signed by Skeet Latch MD Signature Date/Time: 01/08/2021/10:08:53 AM    Final     Recent Labs: Lab Results  Component Value Date   WBC 8.6 10/19/2020   HGB 16.5 (H) 10/19/2020   PLT 278 10/19/2020   NA 140 10/19/2020   K 4.6 10/19/2020   CL 103 10/19/2020   CO2 28 10/19/2020   GLUCOSE 119 (H) 10/19/2020   BUN 13 10/19/2020   CREATININE 0.75 10/19/2020   BILITOT 0.5 10/19/2020   ALKPHOS 72 06/18/2020   AST 22 10/19/2020   ALT 17 10/19/2020   PROT 6.3 10/19/2020   ALBUMIN 4.0 06/18/2020   CALCIUM 9.7 10/19/2020   GFRAA 100 06/22/2020   QFTBGOLDPLUS NEGATIVE 03/16/2020    Speciality Comments: Prior therapy: Plaquenil (allergy), Humira and Enbrel (inadequate response)  Reclast: 06/19/16, 06/29/17, 07/08/2018, 07/20/19, 08/08/20 DEXA 02/07/19  Procedures:  No procedures performed Allergies: Adalimumab, Diclofenac, Dicyclomine, Doxycycline hyclate, Enbrel [etanercept], Fosamax [alendronate sodium], Methotrexate derivatives, Nickel, Piroxicam, Salagen [pilocarpine hcl], Sulfamethoxazole-trimethoprim, Tramadol hcl, and Plaquenil [hydroxychloroquine]   Assessment / Plan:     Visit Diagnoses: Rheumatoid arthritis involving multiple sites with positive rheumatoid factor (HCC) - +RF, +CCP, ANA-,severe erosive: Patient had no synovitis on  examination.  She states since she switched from methotrexate to Orencia she has been experiencing increased pain.  I will check sedimentation rate today.  If her sed rate is elevated and she is ongoing pain we can switch her from Imuran to methotrexate.  I reviewed Dr. Elliot Cousin notes and he has no objection to switching from Imuran to methotrexate.  According to him Imuran has not been effective for ILD.  High risk medication use - Orencia 125 mg sq injections every 7 days and Imuran 100 mg daily. (previously on MTX and folic acid). - Plan: CBC with Differential/Platelet, COMPLETE METABOLIC PANEL WITH GFR, Sedimentation rate.  She was advised to hold them.  And Orencia in case she develops an infection and resume after the infection resolves.  Information regarding realization was also placed in the  AVS.  Sicca syndrome (HCC)-over-the-counter products were discussed.  ILD (interstitial lung disease) ([place]) - Evaluated by Dr. [name] and diagnosed with mild ILD.  High-resolution chest CT 03/05/2020.  According to the pulmonary notes her ILD is getting worse and she may need Ofev.  Primary osteoarthritis of both hands-she continues to have discomfort in her hands.  She has underlying osteoarthritis.  No synovitis was noted.  Primary osteoarthritis of both knees - X-rays of both knees were updated on 03/28/2019 which were consistent with moderate to severe osteoarthritis and severe chondromalacia patella.  She has significant stiffness and discomfort in her knee joints.  She requested Visco supplement injections.  We will apply for Visco supplement injections.  Primary osteoarthritis of both feet-she has ongoing pain and discomfort in her feet.  No synovitis was noted.  DDD (degenerative disc disease), lumbar-she denies any discomfort.  Age-related osteoporosis without current pathological fracture - DEXA 02/07/2019 Lowest T-score and site measured: Left Femoral Neck -2.9. Her most recent Reclast infusion  was on 08/08/2020.  She has had preclearance from June 2018 till August 2022 on a yearly basis.  We will discontinue Reclast.  Based on her next DEXA scan will plan for future treatment.  Abnormal SPEP  History of rosacea - Followed by dermatology  History of hyperlipidemia  History of sleep apnea - She uses a CPAP nightly.    History of gastroesophageal reflux (GERD)  Orders: Orders Placed This Encounter  Procedures   DG Bone Density   CBC with Differential/Platelet   COMPLETE METABOLIC PANEL WITH GFR   Sedimentation rate   Meds ordered this encounter  Medications   zolpidem (AMBIEN) 5 MG tablet    Sig: Take 0.5 tablets (2.5 mg total) by mouth at bedtime as needed for sleep.    Dispense:  15 tablet    Refill:  0     Follow-Up Instructions: Return in about 3 months (around 04/22/2021) for Rheumatoid arthritis, ILD, Osteoporosis.   Bo Merino, MD  Note - This record has been created using Editor, commissioning.  Chart creation errors have been sought, but may not always  have been located. Such creation errors do not reflect on  the standard of medical care.

## 2021-01-11 ENCOUNTER — Encounter: Payer: Self-pay | Admitting: Internal Medicine

## 2021-01-11 ENCOUNTER — Ambulatory Visit (INDEPENDENT_AMBULATORY_CARE_PROVIDER_SITE_OTHER): Payer: Medicare Other | Admitting: Internal Medicine

## 2021-01-11 VITALS — BP 120/74 | HR 64 | Temp 98.1°F | Ht 66.0 in | Wt 178.4 lb

## 2021-01-11 DIAGNOSIS — J849 Interstitial pulmonary disease, unspecified: Secondary | ICD-10-CM | POA: Diagnosis not present

## 2021-01-11 DIAGNOSIS — R0602 Shortness of breath: Secondary | ICD-10-CM | POA: Diagnosis not present

## 2021-01-11 NOTE — Progress Notes (Signed)
Subjective:    Patient ID: Deanna Norris, female    DOB: 1945-08-28, 76 y.o.   MRN: [id number]  HPI    OV 11/01/2013  Chief Complaint  Patient presents with   Follow-up    Pt here after cards referral and CPST. Pt c/o dyspnea only with over exertion. Pt denies cough and CP   Follow-up dyspnea evaluation after cardiopulmonary stress testing. She underwent cardiopulmonary stress testing on 10/10/2013  Results show maximal effort but the VO2 max of 108% predicted and a normal anaerobic threshold. Her ventilator parameters were normal. She appropriately reached heart rate max with a normal heart rate reserve at peak exercise. However the O2 pulse which is a surrogate for stroke volume was flat during the second half of exercise. This is classically consistent with mild diastolic dysfunction especially in the context of a normal nuclear medicine cardiac stress test. There was no evidence of exercise induced bronchospasm.  She has coronary artery calcification and she underwent a nuclear medicine cardiac stress test 10/19/2013 that has been reported as normal   OV Oct 2020: Dr Vaughan Browner Chief complaint: Follow up for mild asthma  HPI: 76 year old with history of sarcoidosis, rheumatoid arthritis, sleep apnea Complains of dyspnea on exertion for the past few months.  She has mild symptoms at rest.  Denies any cough, sputum production, fevers, chills Previously evaluated by Dr. [name] in 2015 with high-resolution CT with no evidence of interstitial lung disease. She also had a cardiology evaluation at that time for coronary atherosclerosis with negative stress test  Has history of seropositive rheumatoid arthritis, Sjogren's syndrome.  Maintained on Orencia and methotrexate.  Followed with Dr. Estanislado Pandy. Has sleep apnea for which she is on CPAP for the past 15 years with no issues.  Sees Dr. Maxwell Caul for OSA.  Pets: No pets Occupation: Homemaker Exposures: No known exposures.   No mold, hot tub, Jacuzzi Smoking history: Never smoker Travel history: Generally from [place].  Has been in [place] since 1990 Relevant family history: No significant family history of lung disease   Given Symbicort at last visit.  She has not noticed any difference with this and hence stopped it a month ago. She is wary of using inhaled corticosteroids due to side effects of osteoporosis.     OV 04/04/2020  Subjective:  Patient ID: Deanna Norris, female , DOB: [birth year] , age 76 y.o. , MRN: [id number] , ADDRESS: [place] [address] PCP Tower, [name], MD Patient Care Team: Tower, [name], MD as PCP - Kennon Portela, MD as Consulting Physician (Dermatology) Juanita Craver, MD as Consulting Physician (Gastroenterology) Marica Otter, OD as Consulting Physician (Optometry) Belva Crome, MD as Consulting Physician (Cardiology)  This Provider for this visit: Treatment Team:  Attending Provider: Brand Males, MD    04/04/2020 -   Chief Complaint  Patient presents with   Follow-up    Pt states that she does have complaints of SOB which is worse with activities.     HPI Deanna Norris 76 y.o. -returns for follow-up.  I personally saw her in 2015 for shortness of breath.  High-resolution CT chest at that time was clean.  Then in 2020 she started noticing insidious onset of shortness of breath.  She followed up with Dr. Vaughan Browner.  CT chest showed possible early onset ILD.  Then again because of the pandemic she did not follow-up.  She is now following up with Dr. [name] to establish care  in the ILD center.  She saying she is having worsening shortness of breath with exertion relieved by rest.  Symptom severity is documented below.  She saying is definitely progressive.  There is no associated chest pain.  She is seeing cardiology and apparently she has been cleared.  She is immunosuppressed.  She is been on methotrexate since 2005 2006 then  approximately in 2012 she got switched to injection form of methotrexate following vertigo with the oral form.  She has been on Orencia for 8 years.  She is not on prednisone or Bactrim.  Most recently in February 2022 she had high-resolution CT chest that shows presence of ILD and that is progressive compared to 2 years earlier.  I personally visualized this film and showed it to the patient.  Therefore she is being referred here.  Last pulmonary function test was in 2015.      CT Chest data 03/05/20   Narrative & Impression  CLINICAL DATA:  76 year old female with history of shortness of breath on exertion. History of rheumatoid arthritis.   EXAM: CT CHEST WITHOUT CONTRAST   TECHNIQUE: Multidetector CT imaging of the chest was performed following the standard protocol without intravenous contrast. High resolution imaging of the lungs, as well as inspiratory and expiratory imaging, was performed.   COMPARISON:  High-resolution chest CT 08/04/2018.   FINDINGS: Cardiovascular: Heart size is normal. There is no significant pericardial fluid, thickening or pericardial calcification. There is aortic atherosclerosis, as well as atherosclerosis of the great vessels of the mediastinum and the coronary arteries, including calcified atherosclerotic plaque in the left main, left anterior descending and right coronary arteries.   Mediastinum/Nodes: No pathologically enlarged mediastinal or hilar lymph nodes. Please note that accurate exclusion of hilar adenopathy is limited on noncontrast CT scans. Esophagus is unremarkable in appearance. No axillary lymphadenopathy.   Lungs/Pleura: High-resolution images again demonstrate minimal ground-glass attenuation in the extreme lung bases. Trace amount of septal thickening noted, most evident in the inferior segment of the lingula and lateral aspect of the left lower lobe. No traction bronchiectasis or honeycombing. Inspiratory and expiratory  imaging demonstrates moderate air trapping indicative of small airways disease. No acute consolidative airspace disease. No pleural effusions. No suspicious appearing pulmonary nodules or masses are noted.   Upper Abdomen: Aortic atherosclerosis.   Musculoskeletal: There are no aggressive appearing lytic or blastic lesions noted in the visualized portions of the skeleton.   IMPRESSION: 1. Very subtle changes suggestive of interstitial lung disease redemonstrated, with minimal progression compared to the prior study. Findings are extremely mild and considered indeterminate for usual interstitial pneumonia (UIP) per current ATS guidelines. If there is persistent clinical concern for interstitial lung disease, repeat high-resolution chest CT is recommended in 12 months to assess for temporal changes in the appearance of the lung parenchyma. 2. Moderate air trapping indicative of small airways disease. 3. Aortic atherosclerosis, in addition to left main and 2 vessel coronary artery disease. Assessment for potential risk factor modification, dietary therapy or pharmacologic therapy may be warranted, if clinically indicated.   Aortic Atherosclerosis (ICD10-I70.0).     Electronically Signed   By: Vinnie Langton M.D.   On: 03/06/2020 08:59        OV 07/11/2020  Subjective:  Patient ID: Deanna Norris, female , DOB: Apr 12, 1945 , age 18 y.o. , MRN: [id number] , ADDRESS: [address] [place] [address] PCP Tower, [name], MD Patient Care Team: Abner Greenspan, MD as PCP - General Jarome Matin,  MD as Consulting Physician (Dermatology) Juanita Craver, MD as Consulting Physician (Gastroenterology) Marica Otter, OD as Consulting Physician (Optometry) Belva Crome, MD as Consulting Physician (Cardiology)  This Provider for this visit: Treatment Team:  Attending Provider: Brand Males, MD    07/11/2020 -   Chief Complaint  Patient presents with   Follow-up    Pt  has not used inhalers.    Follow-up shortness of breath in the setting of rheumatoid arthritis and Sjogren's Immunosuppressed in the setting of methotrexate and Orencia History of coronary artery calcification -negative stress test 2015 Echo 2020 -with mild to moderate aortic incompetence Obesity History of prior asthma Early/mild indeterminate for UIP ILD  -2015 no ILD -July 2020 through February 2022 with minimal progression   HPI Deanna Norris 76 y.o. --presents for follow-up.  She is status post bronchoscopy approximately just over a month ago.  The cultures grew Candida it was 75% polys.  We put her on fluconazole she is taking that right now.  No side effects.  She is not noticing any difference in her symptoms.  Suspect it was a contaminant.  No other organisms grown.  She started pulmonary rehabilitation.  His symptom score is unchanged.  She had pulmonary function test shows both FVC and DLCO decline of 5% over 7 years which is 0.7 %/year.  Is a very minimal decline.  Her DLCO currently is normal.  Therefore the ILD is extremely minimal.  Nevertheless she is out of proportion dyspnea and she is very worried about this.  We discussed that dyspnea could be multifactorial based on weight, possible diastolic dysfunction [although echo in 2020 did not specifically reveal this].,  Moderate aortic incompetence, physical deconditioning.  We will have to see how pulmonary rehabilitation works.  She believes asthma is not active she has an albuterol inhaler with her which she does not use and has never felt the need to use it.  There is no fever or chills.     Results for SYNDA, BAGENT (MRN [id number]) as of 07/11/2020 15:46  Ref. Range 06/07/2020 09:30  Monocyte-Macrophage-Serous Fluid Latest Ref Range: 50 - 90 % 8 (L)  Other Cells, Fluid Latest Units: % LINING CELLS PRESENT  Fluid Type-FCT Unknown Bronch Lavag  Color, Fluid Unknown COLORLESS  Total Nucleated Cell Count, Fluid Latest  Ref Range: 0 - 1,000 cu mm 47  Lymphs, Fluid Latest Units: % 16  Eos, Fluid Latest Units: % 2  Appearance, Fluid Latest Ref Range: CLEAR  HAZY (A)  Neutrophil Count, Fluid Latest Ref Range: 0 - 25 % 74 (H)   Fungal result 1 Candida albicans Abnormal     OV 01/11/2021  Subjective:  Patient ID: Deanna Norris, female , DOB: Mar 23, 1945 , age 71 y.o. , MRN: [id number] , ADDRESS: [address] [place] [address] PCP Tower, [name], MD Patient Care Team: Abner Greenspan, MD as PCP - Kennon Portela, MD as Consulting Physician (Dermatology) Juanita Craver, MD as Consulting Physician (Gastroenterology) Marica Otter, OD as Consulting Physician (Optometry) Belva Crome, MD as Consulting Physician (Cardiology)  This Provider for this visit: Treatment Team:  Attending Provider: Brand Males, MD    01/11/2021 -   Chief Complaint  Patient presents with   Follow-up    PFT performed 1/5. Pt states since having a medication change, she has been having joint pain and also states that she has been fatigued more. Also states that she has had some chest discomfort.   Follow-up shortness  of breath in the setting of rheumatoid arthritis and Sjogren's  - ILD -Grade 1 diastolic dysfunction echocardiogram January 2023. Immunosuppressed in the setting of  RA RX  - Orencia  - since 2016 -Methotrexate August 2006 as oral medication and then since 2002 injection methotrexate -> stopped August 2022 -Imuran since August 2022 [due to ILD and rheumatoid arthritis]  -Bronchoscopy June 2022 with 70% polys and Candida.  Status post Diflucan. History of coronary artery calcification -negative stress test 2015  - Echo 2020 -with mild to moderate aortic incompetence Obesity History of prior asthma Early/mild indeterminate for UIP ILD  -2015 no ILD - July 2020 - mild ILD - - FEb 2022 CT - minimal ILD with Air trapping. INdeterminate for UIP Patter - - Jan 2023 - progession in PFT HPI Deanna Norris 76 y.o. -returns for follow-up.  She presents with her husband.  She tells me that she has had a good Christmas.  She had 13 people over in the house and she is able to cook.  She is currently trying to start a low carbohydrate diet and achieve some weight loss.  She states recently that she is having active acid reflux.  She is on Pepcid but this seems to be insufficient.  The low carbohydrate diet is not fully kicked in.  She has never tried PPI.  In the interim she did attend pulmonary rehabilitation and it helped.  Of note back in August 2022 after her bronchoscopy given her ILD and we recommended change of her methotrexate to Imuran.  She is now on Imuran and tolerating it well.  However she feels that her joint pain and rheumatoid arthritis is more active.  It is still not at the level where it is interfering with her quality of life but she can definitely feel it.  She would prefer to go back to methotrexate.  We did discuss the possibility of methotrexate lung toxicity.  Current literature shows the incidence of this is less than 1%.  Typically patients have lymphocytosis.  Her bronchoscopy showed more neutrophilia.  Therefore I did express to her that the evidence that she has methotrexate lung toxicity despite being on it for many years is very low.  Did support the idea that priority would be to control the rheumatoid arthritis joint symptoms  In terms of ILD: She had pulmonary function test and what is concerning here is that her pulmonary function test shows decline compared to July 2022.  She also had an echocardiogram that just shows grade 1 diastolic dysfunction.  We did discuss the possibility that acid reflux be contributing to decline in lung function   SYMPTOM SCALE - ILD 04/04/2020  07/11/2020 Nw in rehab  June 2022 0 BAL > 70% polys and grew candida 01/11/2021   O2 use ra  ra  Shortness of Breath 0 -> 5 scale with 5 being worst (score 6 If unable to do)    At rest 0 0 0   Simple tasks - showers, clothes change, eating, shaving 0 0 0  Household (dishes, doing bed, laundry) _0 Shopping 1 1 0  Walking level at own pace 1 1 0  Walking up Stairs _1 Total (30-36) Dyspnea Score _2 How bad is your cough? 0 0 1  How bad is your fatigue _3 How bad is nausea _4 How bad is vomiting?  0 0 0  How bad is  diarrhea? 0 0 0  How bad is anxiety? 0 0 00  How bad is depression 0 0 0    Simple office walk 185 feet x  3 laps goal with forehead probe 04/04/2020  01/11/2021    O2 used ra ra  Number laps completed 3 3  Comments about pace mod brisk  Resting Pulse Ox/HR 100% and 60/min 99% and 64  Final Pulse Ox/HR 95% and 83/min 96% and 90  Desaturated </= 88% no no  Desaturated <= 3% points yesm 5 Yes, 3  Got Tachycardic >/= 90/min bo yes  Symptoms at end of test Mild dyspena Mild dyspnea  Miscellaneous comments x    CT Chest data  ECHOCARDIOGRAM COMPLETE  Result Date: 01/08/2021    ECHOCARDIOGRAM REPORT   Patient Name:   Deanna Norris Date of Exam: 01/08/2021 Medical Rec #:  [id number]          Height:       66.0 in Accession #:    [id number]         Weight:       183.0 lb Date of Birth:  1945/08/30         BSA:          1.926 m Patient Age:    45 years           BP:           144/68 mmHg Patient Gender: F                  HR:           62 bpm. Exam Location:  [facility] Procedure: 2D Echo, 3D Echo, Cardiac Doppler, Color Doppler and Strain Analysis Indications:    R06.02 Shortness of breath  History:        Patient has prior history of Echocardiogram examinations, most                 recent 12/21/2018. CAD, Signs/Symptoms:Shortness of Breath; Risk                 Factors:Sleep Apnea. Sjogren's syndrome.  Sonographer:    Basilia Jumbo BS, RDCS Referring Phys: Brand Males IMPRESSIONS  1. Left ventricular ejection fraction, by estimation, is 60 to 65%. The left ventricle has normal function. The left ventricle has no regional wall motion  abnormalities. Left ventricular diastolic parameters are consistent with Grade I diastolic dysfunction (impaired relaxation). The average left ventricular global longitudinal strain is -24.3 %. The global longitudinal strain is normal.  2. Right ventricular systolic function is normal. The right ventricular size is normal. There is normal pulmonary artery systolic pressure.  3. Left atrial size was mildly dilated.  4. The mitral valve is normal in structure. Trivial mitral valve regurgitation. No evidence of mitral stenosis.  5. The aortic valve is tricuspid. There is mild calcification of the aortic valve. There is mild thickening of the aortic valve. Aortic valve regurgitation is mild. Aortic valve sclerosis is present, with no evidence of aortic valve stenosis.  6. Aortic dilatation noted. There is mild dilatation of the ascending aorta, measuring 38 mm.  7. The inferior vena cava is normal in size with greater than 50% respiratory variability, suggesting right atrial pressure of 3 mmHg. Comparison(s): 12/21/18 EF 68%. GLS-20.6%. PA pressure 22mHg. FINDINGS  Left Ventricle: Left ventricular ejection fraction, by estimation, is 60 to 65%. The left ventricle has normal function. The left ventricle has no regional wall motion abnormalities. The average  left ventricular global longitudinal strain is -24.3 %. The global longitudinal strain is normal. The left ventricular internal cavity size was normal in size. There is no left ventricular hypertrophy. Left ventricular diastolic parameters are consistent with Grade I diastolic dysfunction (impaired relaxation). Indeterminate filling pressures. Right Ventricle: The right ventricular size is normal. No increase in right ventricular wall thickness. Right ventricular systolic function is normal. There is normal pulmonary artery systolic pressure. The tricuspid regurgitant velocity is 2.62 m/s, and  with an assumed right atrial pressure of 3 mmHg, the estimated right  ventricular systolic pressure is 09.6 mmHg. Left Atrium: Left atrial size was mildly dilated. Right Atrium: Right atrial size was normal in size. Pericardium: There is no evidence of pericardial effusion. Mitral Valve: The mitral valve is normal in structure. Trivial mitral valve regurgitation. No evidence of mitral valve stenosis. Tricuspid Valve: The tricuspid valve is normal in structure. Tricuspid valve regurgitation is mild . No evidence of tricuspid stenosis. Aortic Valve: The aortic valve is tricuspid. There is mild calcification of the aortic valve. There is mild thickening of the aortic valve. Aortic valve regurgitation is mild. Aortic regurgitation PHT measures 712 msec. Aortic valve sclerosis is present,  with no evidence of aortic valve stenosis. Pulmonic Valve: The pulmonic valve was normal in structure. Pulmonic valve regurgitation is not visualized. No evidence of pulmonic stenosis. Aorta: Aortic dilatation noted. There is mild dilatation of the ascending aorta, measuring 38 mm. Venous: The inferior vena cava is normal in size with greater than 50% respiratory variability, suggesting right atrial pressure of 3 mmHg. IAS/Shunts: No atrial level shunt detected by color flow Doppler.  LEFT VENTRICLE PLAX 2D LVIDd:         4.70 cm   Diastology LVIDs:         3.00 cm   LV e' medial:    8.03 cm/s LV PW:         0.90 cm   LV E/e' medial:  9.5 LV IVS:        0.90 cm   LV e' lateral:   9.00 cm/s LVOT diam:     2.10 cm   LV E/e' lateral: 8.5 LV SV:         92 LV SV Index:   48        2D Longitudinal Strain LVOT Area:     3.46 cm  2D Strain GLS (A2C):   -19.7 %                          2D Strain GLS (A3C):   -30.6 %                          2D Strain GLS (A4C):   -22.5 %                          2D Strain GLS Avg:     -24.3 %                           3D Volume EF:                          3D EF:        61 %  LV EDV:       145 ml                          LV ESV:       56 ml                           LV SV:        88 ml RIGHT VENTRICLE             IVC RV Basal diam:  3.20 cm     IVC diam: 2.10 cm RV S prime:     16.10 cm/s TAPSE (M-mode): 3.3 cm RVSP:           35.5 mmHg LEFT ATRIUM             Index        RIGHT ATRIUM           Index LA diam:        4.50 cm 2.34 cm/m   RA Pressure: 8.00 mmHg LA Vol (A2C):   61.8 ml 32.09 ml/m  RA Area:     11.70 cm LA Vol (A4C):   57.3 ml 29.76 ml/m  RA Volume:   23.20 ml  12.05 ml/m LA Biplane Vol: 60.8 ml 31.57 ml/m  AORTIC VALVE LVOT Vmax:   107.00 cm/s LVOT Vmean:  75.300 cm/s LVOT VTI:    0.267 m AI PHT:      712 msec  AORTA Ao Root diam: 3.40 cm Ao Asc diam:  3.80 cm MITRAL VALVE               TRICUSPID VALVE                            TR Peak grad:   27.5 mmHg MV Decel Time: 219 msec    TR Vmax:        262.00 cm/s MV E velocity: 76.45 cm/s  Estimated RAP:  8.00 mmHg MV A velocity: 83.90 cm/s  RVSP:           35.5 mmHg MV E/A ratio:  0.91                            SHUNTS                            Systemic VTI:  0.27 m                            Systemic Diam: 2.10 cm Skeet Latch MD Electronically signed by Skeet Latch MD Signature Date/Time: 01/08/2021/10:08:53 AM    Final       PFT  PFT Results Latest Ref Rng & Units 01/10/2021 07/11/2020 09/01/2013  FVC-Pre L 2.47 2.65 2.78  FVC-Predicted Pre % 80 85 83  FVC-Post L - 2.59 2.86  FVC-Predicted Post % - 83 86  Pre FEV1/FVC % % 75 75 73  Post FEV1/FCV % % - 81 80  FEV1-Pre L 1.86 2.00 2.03  FEV1-Predicted Pre % 80 85 80  FEV1-Post L - 2.11 2.29  DLCO uncorrected ml/min/mmHg 17.40 19.17 20.14  DLCO UNC% % 85 93 74  DLCO corrected ml/min/mmHg 17.40 17.93 -  DLCO COR %Predicted % 85 87 -  DLVA Predicted % 98 99 99  TLC L - 4.66 5.47  TLC % Predicted % - 86 102  RV % Predicted % - 71 101       has a past medical history of Asthma, Constipation, Fibroid, GERD (gastroesophageal reflux disease), Insomnia, Interstitial lung disease ([place]), Lumbar vertebral fracture ([place]), Menopausal  symptoms, Osteoporosis, Postmenopausal HRT (hormone replacement therapy), RA (rheumatoid arthritis) ([place]) (11/2004), Rosacea, Sjogren's disease ([place]), Sleep apnea (2008), and Vitamin D deficiency.   reports that she has never smoked. She has never used smokeless tobacco.  Past Surgical History:  Procedure Laterality Date   ABDOMINAL HYSTERECTOMY  01/1993   partial with AP repair, secondary to fibroids   BREAST SURGERY  1991   breast biopsy   BRONCHIAL WASHINGS  06/07/2020   Procedure: BRONCHIAL WASHINGS;  Surgeon: Brand Males, MD;  Location: [facility];  Service: Endoscopy;;   CYSTOCELE REPAIR  10/2007   with graft   MELANOMA EXCISION Right 01/2012   excision right posterior arm   NASAL SINUS SURGERY  07/1991   VAGINAL DELIVERY     x3   VIDEO BRONCHOSCOPY N/A 06/07/2020   Procedure: VIDEO BRONCHOSCOPY WITHOUT FLUORO;  Surgeon: Brand Males, MD;  Location: [facility];  Service: Endoscopy;  Laterality: N/A;    Allergies  Allergen Reactions   Adalimumab     REACTION: frequent sinus infection   Diclofenac     unk   Dicyclomine     Turn teeth gray   Doxycycline Hyclate Other (See Comments)    "turns teeth gray"   Enbrel [Etanercept]     SINUS INFECTIONS   Fosamax [Alendronate Sodium]     Acid reflux   Methotrexate Derivatives Nausea Only    Oral metotrexate   Nickel    Piroxicam     unk   Salagen [Pilocarpine Hcl] Other (See Comments)    Excessive sweating, headaches   Sulfamethoxazole-Trimethoprim     REACTION: face and hands rash and felt burning sensation   Tramadol Hcl     REACTION: nausea   Plaquenil [Hydroxychloroquine] Rash    Immunization History  Administered Date(s) Administered   DTaP 04/06/2013   Influenza Split 10/22/2010   Influenza Whole 10/10/2009, 10/20/2011   Influenza, High Dose Seasonal PF 10/14/2016, 10/08/2018, 10/07/2019   Influenza, Seasonal, Injecte, Preservative Fre 10/06/2012   Influenza,inj,Quad PF,6+ Mos 10/30/2014    Influenza,inj,quad, With Preservative 10/12/2017   Influenza-Unspecified 10/26/2013, 10/22/2015, 11/02/2020   PFIZER(Purple Top)SARS-COV-2 Vaccination 06/02/2019, 08/26/2019   Pneumococcal Conjugate-13 02/13/2014   Pneumococcal Polysaccharide-23 06/12/2010, 03/28/2015   Td 02/07/2003, 03/23/2013   Tdap 03/23/2013   Zoster, Live 05/08/2014    Family History  Problem Relation Age of Onset   Breast cancer Sister 78   Thyroid disease Sister    Cancer Sister 49       Leukemia--CLL   Heart disease Brother      Current Outpatient Medications:    acetaminophen (TYLENOL) 500 MG tablet, Take 500-1,000 mg by mouth every 8 (eight) hours as needed for moderate pain., Disp: , Rfl:    albuterol (PROAIR HFA) 108 (90 Base) MCG/ACT inhaler, Inhale 2 puffs into the lungs every 4 (four) hours as needed for wheezing or shortness of breath., Disp: 18 g, Rfl: 3   Ascorbic Acid (VITAMIN C PO), Take 500 mg by mouth daily., Disp: , Rfl:    aspirin 81 MG tablet, Take 81 mg by mouth daily., Disp: , Rfl:    azaTHIOprine (IMURAN) 50 MG tablet, Take 2 tablets (100 mg  total) by mouth daily., Disp: 180 tablet, Rfl: 0   budesonide (PULMICORT) 180 MCG/ACT inhaler, Inhale 1 puff into the lungs daily as needed (asthma)., Disp: , Rfl:    Calcium Carbonate-Vitamin D (CALCIUM 500/D PO), Take 1 tablet by mouth 2 (two) times daily., Disp: , Rfl:    Cholecalciferol (VITAMIN D3) 2000 UNITS TABS, Take 2,000 Units by mouth daily., Disp: , Rfl:    cycloSPORINE (RESTASIS) 0.05 % ophthalmic emulsion, Place 1 drop into both eyes 2 (two) times daily., Disp: , Rfl:    desonide (DESOWEN) 0.05 % lotion, Apply 1 application topically daily as needed (irritation)., Disp: , Rfl:    diclofenac Sodium (VOLTAREN) 1 % GEL, Apply 2-4 grams to affected joint 4 times daily as needed. (Patient taking differently: Apply 2-4 g topically 4 (four) times daily as needed (pain). Apply 2-4 grams to affected joint 4 times daily as needed.), Disp: 400 g,  Rfl: 2   Estradiol (VAGIFEM) 10 MCG TABS vaginal tablet, Place 1 tablet (10 mcg total) vaginally 2 (two) times a week. Place one tablet (10 mcg) per vagina at bedtime twice a week., Disp: 24 tablet, Rfl: 3   famotidine (PEPCID) 40 MG tablet, Take 1 tablet (40 mg total) by mouth 2 (two) times daily., Disp: 180 tablet, Rfl: 3   fluocinonide (LIDEX) 0.05 % external solution, Apply topically., Disp: , Rfl:    fluticasone (FLONASE) 50 MCG/ACT nasal spray, USE 2 SPRAYS IN EACH NOSTRIL DAILY AS NEEDED FOR RHINITIS OR ALLERGIES (Patient taking differently: Place 2 sprays into both nostrils daily as needed for allergies. USE 2 SPRAYS IN EACH NOSTRIL DAILY AS NEEDED FOR RHINITIS OR ALLERGIES), Disp: 48 g, Rfl: 3   ibuprofen (ADVIL) 200 MG tablet, Take 200-400 mg by mouth every 8 (eight) hours as needed (pain)., Disp: , Rfl:    Magnesium Malate 1250 (141.7 Mg) MG TABS, Take 3 tablets by mouth daily. , Disp: , Rfl:    Multiple Vitamin (MULTI-VITAMINS) TABS, Take 1 tablet by mouth daily., Disp: , Rfl:    Omega-3 1000 MG CAPS, Take 1,000 mg by mouth daily., Disp: , Rfl:    ORENCIA 125 MG/ML SOSY, INJECT THE CONTENTS OF 1 SYRINGE UNDER THE SKIN EVERY WEEK, Disp: 4 mL, Rfl: 2   Propylene Glycol (SYSTANE BALANCE) 0.6 % SOLN, Place 1 drop into both eyes daily as needed (dry eyes)., Disp: , Rfl:    triamcinolone cream (KENALOG) 0.1 %, SMARTSIG:1 Application Topical 2-3 Times Daily, Disp: , Rfl:    Zinc 50 MG TABS, Take 50 mg by mouth daily., Disp: , Rfl:    zoledronic acid (RECLAST) 5 MG/100ML SOLN injection, Inject 5 mg into the vein once. Takes yearly, Disp: , Rfl:    zolpidem (AMBIEN) 10 MG tablet, Take 0.5 tablets (5 mg total) by mouth at bedtime as needed for sleep., Disp: 30 tablet, Rfl: 2      Objective:   Vitals:   01/11/21 0948  BP: 120/74  Pulse: 64  Temp: 98.1 F (36.7 C)  TempSrc: Oral  SpO2: 99%  Weight: 178 lb 6.4 oz (80.9 kg)  Height: _0  (1.676 m)    Estimated body mass index is 28.79  kg/m as calculated from the following:   Height as of this encounter: _1  (1.676 m).   Weight as of this encounter: 178 lb 6.4 oz (80.9 kg).  _2 @  [facility] Weights   01/11/21 0948  Weight: 178 lb 6.4 oz (80.9 kg)     Physical Exam  General: No distress. obese Neuro: Alert and Oriented x 3. GCS 15. Speech normal Psych: Pleasant Resp:  Barrel Chest - no.  Wheeze - no, Crackles - no, No overt respiratory distress CVS: Normal heart sounds. Murmurs - no Ext: Stigmata of Connective Tissue Disease - no HEENT: Normal upper airway. PEERL +. No post nasal drip        Assessment:       ICD-10-CM   1. ILD (interstitial lung disease) ([place])  J84.9 CT Chest High Resolution    2. Shortness of breath  R06.02          Plan:     Patient Instructions     ICD-10-CM   1. 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 ILD (interstitial lung disease) ([place])  J84.9   3. History of rheumatoid arthritis  Z87.39   4. Immunosuppressed status ([place])  D84.9     You have onset of interstitial lung diease (ILD) in 2020  Now concerning for progression (5-7% 2015 -> July 2022 and another 5-7% July 2022 through JAn 2023)  Echo Jan 2023 is normal but for very mild heart muscle stiffness c/w age, weight  Bronchoscopy with lavage June 07, 2020: does not suggest methotrexate lung toxicity  Glad pulm rehab helped you but seems subjectively RA joints are worse in setting of Orencia and Immuran  Active GERD present   Plan - get HRCT supine and prone - if ILD suggests worsening - will discuss ofev antifibrotic  -If Dr. Estanislado Pandy and you want to switch back ot methotrexate or anther agent for RA instead of immuran I am ok with that esp with concern that immuran might not be helping your RA  -Recommend acid reflux control through low carbohydrate diet and also doing over-the-counter Zegerid    Follow-up   -next few to several weeks in 30 min slot  to discuss CT - video or face to face    ( Level 05 visit: Estb 40-54 min in  visit type: on-site physical face to visit  in total care time and counseling or/and coordination of care by this undersigned MD - Dr Brand Males. This includes one or more of the following on this same day 01/11/2021: pre-charting, chart review, note writing, documentation discussion of test results, diagnostic or treatment recommendations, prognosis, risks and benefits of management options, instructions, education, compliance or risk-factor reduction. It excludes time spent by the [place] or office staff in the care of the patient. Actual time 40 min)   SIGNATURE    Dr. Brand Males, M.D., F.C.C.P,  Pulmonary and Critical Care Medicine Staff Physician, [place] Director - Interstitial Lung Disease  Program  [facility] [place] [facility] [place], [place], [address]  Pager: [phone], If no answer or between  15:00h - 7:00h: call [phone] Telephone: [phone]  5:49 PM 01/11/2021

## 2021-01-11 NOTE — Patient Instructions (Addendum)
ICD-10-CM   1. 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 ILD (interstitial lung disease) ([place])  J84.9   3. History of rheumatoid arthritis  Z87.39   4. Immunosuppressed status ([place])  D84.9     You have onset of interstitial lung diease (ILD) in 2020  Now concerning for progression (5-7% 2015 -> July 2022 and another 5-7% July 2022 through JAn 2023)  Echo Jan 2023 is normal but for very mild heart muscle stiffness c/w age, weight  Bronchoscopy with lavage June 07, 2020: does not suggest methotrexate lung toxicity  Glad pulm rehab helped you but seems subjectively RA joints are worse in setting of Orencia and Immuran  Active GERD present   Plan - get HRCT supine and prone - if ILD suggests worsening - will discuss ofev antifibrotic  -If Dr. Estanislado Pandy and you want to switch back ot methotrexate or anther agent for RA instead of immuran I am ok with that esp with concern that immuran might not be helping your RA  -Recommend acid reflux control through low carbohydrate diet and also doing over-the-counter Zegerid    Follow-up   -next few to several weeks in 30 min slot to discuss CT - video or face to face

## 2021-01-16 ENCOUNTER — Encounter: Payer: Self-pay | Admitting: Family Medicine

## 2021-01-16 DIAGNOSIS — H903 Sensorineural hearing loss, bilateral: Secondary | ICD-10-CM | POA: Diagnosis not present

## 2021-01-18 ENCOUNTER — Other Ambulatory Visit: Payer: Self-pay

## 2021-01-18 ENCOUNTER — Ambulatory Visit (HOSPITAL_COMMUNITY)
Admission: RE | Admit: 2021-01-18 | Discharge: 2021-01-18 | Disposition: A | Discharge status: Home / Self Care | Payer: Medicare Other | Source: Ambulatory Visit | Attending: Internal Medicine | Admitting: Internal Medicine

## 2021-01-18 DIAGNOSIS — J8410 Pulmonary fibrosis, unspecified: Secondary | ICD-10-CM | POA: Diagnosis not present

## 2021-01-18 DIAGNOSIS — I700 Atherosclerosis of aorta: Secondary | ICD-10-CM | POA: Diagnosis not present

## 2021-01-18 DIAGNOSIS — J849 Interstitial pulmonary disease, unspecified: Secondary | ICD-10-CM | POA: Insufficient documentation

## 2021-01-18 DIAGNOSIS — I2510 Atherosclerotic heart disease of native coronary artery without angina pectoris: Secondary | ICD-10-CM | POA: Diagnosis not present

## 2021-01-18 DIAGNOSIS — R918 Other nonspecific abnormal finding of lung field: Secondary | ICD-10-CM | POA: Diagnosis not present

## 2021-01-22 ENCOUNTER — Other Ambulatory Visit: Payer: Self-pay

## 2021-01-22 ENCOUNTER — Encounter: Payer: Self-pay | Admitting: Rheumatology

## 2021-01-22 ENCOUNTER — Telehealth: Payer: Self-pay

## 2021-01-22 ENCOUNTER — Ambulatory Visit (INDEPENDENT_AMBULATORY_CARE_PROVIDER_SITE_OTHER): Payer: Medicare Other | Admitting: Rheumatology

## 2021-01-22 VITALS — BP 148/82 | HR 60 | Ht 66.5 in | Wt 178.2 lb

## 2021-01-22 DIAGNOSIS — M170 Bilateral primary osteoarthritis of knee: Secondary | ICD-10-CM

## 2021-01-22 DIAGNOSIS — Z79899 Other long term (current) drug therapy: Secondary | ICD-10-CM

## 2021-01-22 DIAGNOSIS — M19042 Primary osteoarthritis, left hand: Secondary | ICD-10-CM

## 2021-01-22 DIAGNOSIS — Z872 Personal history of diseases of the skin and subcutaneous tissue: Secondary | ICD-10-CM | POA: Diagnosis not present

## 2021-01-22 DIAGNOSIS — Z8669 Personal history of other diseases of the nervous system and sense organs: Secondary | ICD-10-CM

## 2021-01-22 DIAGNOSIS — M0579 Rheumatoid arthritis with rheumatoid factor of multiple sites without organ or systems involvement: Secondary | ICD-10-CM

## 2021-01-22 DIAGNOSIS — J849 Interstitial pulmonary disease, unspecified: Secondary | ICD-10-CM | POA: Diagnosis not present

## 2021-01-22 DIAGNOSIS — M3500 Sicca syndrome, unspecified: Secondary | ICD-10-CM

## 2021-01-22 DIAGNOSIS — R778 Other specified abnormalities of plasma proteins: Secondary | ICD-10-CM | POA: Diagnosis not present

## 2021-01-22 DIAGNOSIS — Z8639 Personal history of other endocrine, nutritional and metabolic disease: Secondary | ICD-10-CM | POA: Diagnosis not present

## 2021-01-22 DIAGNOSIS — M5136 Other intervertebral disc degeneration, lumbar region: Secondary | ICD-10-CM

## 2021-01-22 DIAGNOSIS — M19072 Primary osteoarthritis, left ankle and foot: Secondary | ICD-10-CM

## 2021-01-22 DIAGNOSIS — M810 Age-related osteoporosis without current pathological fracture: Secondary | ICD-10-CM

## 2021-01-22 DIAGNOSIS — M19041 Primary osteoarthritis, right hand: Secondary | ICD-10-CM

## 2021-01-22 DIAGNOSIS — M19071 Primary osteoarthritis, right ankle and foot: Secondary | ICD-10-CM | POA: Diagnosis not present

## 2021-01-22 DIAGNOSIS — Z8719 Personal history of other diseases of the digestive system: Secondary | ICD-10-CM

## 2021-01-22 DIAGNOSIS — M51369 Other intervertebral disc degeneration, lumbar region without mention of lumbar back pain or lower extremity pain: Secondary | ICD-10-CM

## 2021-01-22 MED ORDER — ZOLPIDEM TARTRATE 5 MG PO TABS: 2.5000 mg | ORAL_TABLET | Freq: Every evening | ORAL | 0 refills | Status: DC | PRN

## 2021-01-22 NOTE — Patient Instructions (Signed)
Standing Labs We placed an order today for your standing lab work.   Please have your standing labs drawn in April and every 3 months  TB Gold will be due in April  If possible, please have your labs drawn 2 weeks prior to your appointment so that the provider can discuss your results at your appointment.  Please note that you may see your imaging and lab results in [place] before we have reviewed them. We may be awaiting multiple results to interpret others before contacting you. Please allow our office up to 72 hours to thoroughly review all of the results before contacting the office for clarification of your results.  We have open lab daily: Monday through Thursday from 1:30-4:30 PM and Friday from 1:30-4:00 PM at the office of Dr. Bo Merino, [facility].   Please be advised, all patients with office appointments requiring lab work will take precedent over walk-in lab work.  If possible, please come for your lab work on Monday and Friday afternoons, as you may experience shorter wait times. The office is located at [address] [place], [place], [place] [address] No appointment is necessary.   Labs are drawn by [facility]. Please bring your co-pay at the time of your lab draw.  You may receive a bill from [facility] for your lab work.  Please note if you are on Hydroxychloroquine and and an order has been placed for a Hydroxychloroquine level, you will need to have it drawn 4 hours or more after your last dose.  If you wish to have your labs drawn at another location, please call the office 24 hours in advance to send orders.  If you have any questions regarding directions or hours of operation,  please call [phone].   As a reminder, please drink plenty of water prior to coming for your lab work. Thanks!   Vaccines You are taking a medication(s) that can suppress your immune system.  The following immunizations are recommended: Flu annually Covid-19  Td/Tdap  (tetanus, diphtheria, pertussis) every 10 years Pneumonia (Prevnar 15 then Pneumovax 23 at least 1 year apart.  Alternatively, can take Prevnar 20 without needing additional dose) Shingrix: 2 doses from 4 weeks to 6 months apart  Please check with your PCP to make sure you are up to date.   If you have signs or symptoms of an infection or start antibiotics: First, call your PCP for workup of your infection. Hold your medication through the infection, until you complete your antibiotics, and until symptoms resolve if you take the following: Injectable medication (Actemra, Benlysta, Cimzia, Cosentyx, Enbrel, Humira, Kevzara, Orencia, Remicade, Simponi, Stelara, Taltz, Tremfya) Methotrexate Leflunomide (Arava) Mycophenolate (Cellcept) Morrie Sheldon, Olumiant, or Rinvoq

## 2021-01-22 NOTE — Telephone Encounter (Signed)
Please apply for bilateral knee visco, per Dr. Deveshwar. Thanks!  

## 2021-01-23 LAB — COMPLETE METABOLIC PANEL WITH GFR
AG Ratio: 1.6 (calc) (ref 1.0–2.5)
ALT: 17 U/L (ref 6–29)
AST: 21 U/L (ref 10–35)
Albumin: 4 g/dL (ref 3.6–5.1)
Alkaline phosphatase (APISO): 84 U/L (ref 37–153)
BUN: 12 mg/dL (ref 7–25)
CO2: 32 mmol/L (ref 20–32)
Calcium: 9.4 mg/dL (ref 8.6–10.4)
Chloride: 106 mmol/L (ref 98–110)
Creat: 0.62 mg/dL (ref 0.60–1.00)
Globulin: 2.5 g/dL (calc) (ref 1.9–3.7)
Glucose, Bld: 89 mg/dL (ref 65–99)
Potassium: 4.7 mmol/L (ref 3.5–5.3)
Sodium: 141 mmol/L (ref 135–146)
Total Bilirubin: 0.5 mg/dL (ref 0.2–1.2)
Total Protein: 6.5 g/dL (ref 6.1–8.1)
eGFR: 93 mL/min/{1.73_m2} (ref >=60)

## 2021-01-23 LAB — CBC WITH DIFFERENTIAL/PLATELET
Absolute Monocytes: 805 cells/uL (ref 200–950)
Basophils Absolute: 149 cells/uL (ref 0–200)
Basophils Relative: 1.8 %
Eosinophils Absolute: 1021 cells/uL — ABNORMAL HIGH (ref 15–500)
Eosinophils Relative: 12.3 %
HCT: 49.2 % — ABNORMAL HIGH (ref 35.0–45.0)
Hemoglobin: 16.4 g/dL — ABNORMAL HIGH (ref 11.7–15.5)
Lymphs Abs: 2357 cells/uL (ref 850–3900)
MCH: 32.5 pg (ref 27.0–33.0)
MCHC: 33.3 g/dL (ref 32.0–36.0)
MCV: 97.6 fL (ref 80.0–100.0)
MPV: 10.8 fL (ref 7.5–12.5)
Monocytes Relative: 9.7 %
Neutro Abs: 3967 cells/uL (ref 1500–7800)
Neutrophils Relative %: 47.8 %
Platelets: 241 10*3/uL (ref 140–400)
RBC: 5.04 10*6/uL (ref 3.80–5.10)
RDW: 13.9 % (ref 11.0–15.0)
Total Lymphocyte: 28.4 %
WBC: 8.3 10*3/uL (ref 3.8–10.8)

## 2021-01-23 LAB — SEDIMENTATION RATE: Sed Rate: 14 mm/h (ref 0–30)

## 2021-01-25 ENCOUNTER — Telehealth: Payer: Self-pay

## 2021-01-25 NOTE — Telephone Encounter (Signed)
VOB submitted for Orthovisc, bilateral knee. BV pending.

## 2021-01-25 NOTE — Telephone Encounter (Signed)
Noted.  Will submit.  

## 2021-01-31 ENCOUNTER — Other Ambulatory Visit: Payer: Self-pay | Admitting: Rheumatology

## 2021-01-31 MED ORDER — AZATHIOPRINE 50 MG PO TABS: 100.0000 mg | ORAL_TABLET | Freq: Every day | ORAL | 0 refills | Status: DC

## 2021-01-31 NOTE — Telephone Encounter (Signed)
Patient called the office requesting a refill of Azathioprine 50 mg to be sent to [facility].

## 2021-01-31 NOTE — Telephone Encounter (Signed)
Next Visit: 04/22/2021  Last Visit: 01/22/2021  Last Fill: 10/23/2020  DX: Rheumatoid arthritis involving multiple sites with positive rheumatoid factor   Current Dose per office note 01/22/2021: Imuran 100 mg daily  Labs: 01/22/2021 Hgb 16.4, Hct 49.2, Eosinophils Absolute 1,021  Okay to refill Imuran?

## 2021-02-01 ENCOUNTER — Telehealth: Payer: Self-pay

## 2021-02-01 NOTE — Telephone Encounter (Signed)
Please call patient to schedule for gel injection appointment.  Thank you.  Approved for Orthovisc series, bilateral knee. [place] Must meet Medicare deductible Secondary plan, [facility] will pick up remaining eligible expenses at 100%. No Co-pay  No PA required

## 2021-02-18 ENCOUNTER — Other Ambulatory Visit: Payer: Self-pay | Admitting: Rheumatology

## 2021-02-18 DIAGNOSIS — Z8669 Personal history of other diseases of the nervous system and sense organs: Secondary | ICD-10-CM

## 2021-02-21 ENCOUNTER — Other Ambulatory Visit: Payer: Self-pay

## 2021-02-21 MED ORDER — ZOLPIDEM TARTRATE 5 MG PO TABS: 2.5000 mg | ORAL_TABLET | Freq: Every evening | ORAL | 0 refills | Status: DC | PRN

## 2021-02-21 NOTE — Telephone Encounter (Signed)
Patient states she has tried Orthovisc in the past and it didn't work.  Patient requested we apply for Euflexxa injections.

## 2021-02-21 NOTE — Telephone Encounter (Signed)
Next Visit: 04/22/2021   Last Visit: 01/22/2021  Last Fill: 01/22/2021  Okay to refill Ambien?

## 2021-02-21 NOTE — Telephone Encounter (Signed)
Patient called requesting prescription refill of Ambien to be sent to [facility].

## 2021-02-26 DIAGNOSIS — Z1231 Encounter for screening mammogram for malignant neoplasm of breast: Secondary | ICD-10-CM | POA: Diagnosis not present

## 2021-02-26 DIAGNOSIS — M8588 Other specified disorders of bone density and structure, other site: Secondary | ICD-10-CM | POA: Diagnosis not present

## 2021-02-26 DIAGNOSIS — M810 Age-related osteoporosis without current pathological fracture: Secondary | ICD-10-CM | POA: Diagnosis not present

## 2021-02-26 LAB — HM MAMMOGRAPHY

## 2021-02-26 LAB — HM DEXA SCAN

## 2021-02-27 ENCOUNTER — Encounter: Payer: Self-pay | Admitting: Obstetrics and Gynecology

## 2021-02-27 ENCOUNTER — Telehealth: Payer: Self-pay | Admitting: *Deleted

## 2021-02-27 NOTE — Telephone Encounter (Signed)
Received DEXA results from [facility] .  Date of Scan: 02/26/2021  Lowest T-score:-3.2  BMD:0.490  Lowest site measured:Right Femoral Neck  DX: Osteoporosis  Significant changes in BMD and site measured (5% and above):-13% AP L1-L3, -6 % Right total femur, 16 % Left total Femur   Current Regimen:Reclast Infusion (last infusion August 2022), Calcium, Vitamin D  Recommendation:Patient will need to start new therapy. Okay to discuss at upcoming appointment.   Reviewed by:Hazel Sams, PA-C  Next Appointment:  04/22/2021  Patient advised of results and recommendation.

## 2021-02-28 ENCOUNTER — Ambulatory Visit (INDEPENDENT_AMBULATORY_CARE_PROVIDER_SITE_OTHER): Payer: Medicare Other | Admitting: Internal Medicine

## 2021-02-28 ENCOUNTER — Encounter: Payer: Self-pay | Admitting: Obstetrics and Gynecology

## 2021-02-28 ENCOUNTER — Other Ambulatory Visit: Payer: Self-pay

## 2021-02-28 ENCOUNTER — Encounter: Payer: Self-pay | Admitting: Internal Medicine

## 2021-02-28 VITALS — BP 126/68 | HR 65 | Temp 97.7°F | Ht 66.5 in | Wt 173.2 lb

## 2021-02-28 DIAGNOSIS — D849 Immunodeficiency, unspecified: Secondary | ICD-10-CM | POA: Diagnosis not present

## 2021-02-28 DIAGNOSIS — M359 Systemic involvement of connective tissue, unspecified: Secondary | ICD-10-CM

## 2021-02-28 DIAGNOSIS — J849 Interstitial pulmonary disease, unspecified: Secondary | ICD-10-CM

## 2021-02-28 DIAGNOSIS — Z8739 Personal history of other diseases of the musculoskeletal system and connective tissue: Secondary | ICD-10-CM | POA: Diagnosis not present

## 2021-02-28 DIAGNOSIS — K219 Gastro-esophageal reflux disease without esophagitis: Secondary | ICD-10-CM

## 2021-02-28 DIAGNOSIS — J8489 Other specified interstitial pulmonary diseases: Secondary | ICD-10-CM | POA: Diagnosis not present

## 2021-02-28 NOTE — Progress Notes (Signed)
Subjective:    Patient ID: Deanna Norris, female    DOB: 1945/10/01, 76 y.o.   MRN: [id number]  HPI    OV 11/01/2013  Chief Complaint  Patient presents with   Follow-up    Pt here after cards referral and CPST. Pt c/o dyspnea only with over exertion. Pt denies cough and CP   Follow-up dyspnea evaluation after cardiopulmonary stress testing. She underwent cardiopulmonary stress testing on 10/10/2013  Results show maximal effort but the VO2 max of 108% predicted and a normal anaerobic threshold. Her ventilator parameters were normal. She appropriately reached heart rate max with a normal heart rate reserve at peak exercise. However the O2 pulse which is a surrogate for stroke volume was flat during the second half of exercise. This is classically consistent with mild diastolic dysfunction especially in the context of a normal nuclear medicine cardiac stress test. There was no evidence of exercise induced bronchospasm.  She has coronary artery calcification and she underwent a nuclear medicine cardiac stress test 10/19/2013 that has been reported as normal   OV Oct 2020: Dr Vaughan Browner Chief complaint: Follow up for mild asthma  HPI: 76 year old with history of sarcoidosis, rheumatoid arthritis, sleep apnea Complains of dyspnea on exertion for the past few months.  She has mild symptoms at rest.  Denies any cough, sputum production, fevers, chills Previously evaluated by Dr. [name] in 2015 with high-resolution CT with no evidence of interstitial lung disease. She also had a cardiology evaluation at that time for coronary atherosclerosis with negative stress test  Has history of seropositive rheumatoid arthritis, Sjogren's syndrome.  Maintained on Orencia and methotrexate.  Followed with Dr. Estanislado Pandy. Has sleep apnea for which she is on CPAP for the past 15 years with no issues.  Sees Dr. Maxwell Caul for OSA.  Pets: No pets Occupation: Homemaker Exposures: No known exposures.   No mold, hot tub, Jacuzzi Smoking history: Never smoker Travel history: Generally from [place].  Has been in [place] since 1990 Relevant family history: No significant family history of lung disease   Given Symbicort at last visit.  She has not noticed any difference with this and hence stopped it a month ago. She is wary of using inhaled corticosteroids due to side effects of osteoporosis.     OV 04/04/2020  Subjective:  Patient ID: Deanna Norris, female , DOB: [birth year] , age 13 y.o. , MRN: [id number] , ADDRESS: [place] [address] PCP Tower, [name], MD Patient Care Team: Tower, [name], MD as PCP - Kennon Portela, MD as Consulting Physician (Dermatology) Juanita Craver, MD as Consulting Physician (Gastroenterology) Marica Otter, OD as Consulting Physician (Optometry) Belva Crome, MD as Consulting Physician (Cardiology)  This Provider for this visit: Treatment Team:  Attending Provider: Brand Males, MD    04/04/2020 -   Chief Complaint  Patient presents with   Follow-up    Pt states that she does have complaints of SOB which is worse with activities.     HPI Deanna Norris 76 y.o. -returns for follow-up.  I personally saw her in 2015 for shortness of breath.  High-resolution CT chest at that time was clean.  Then in 2020 she started noticing insidious onset of shortness of breath.  She followed up with Dr. Vaughan Browner.  CT chest showed possible early onset ILD.  Then again because of the pandemic she did not follow-up.  She is now following up with Dr. [name] to establish care  in the ILD center.  She saying she is having worsening shortness of breath with exertion relieved by rest.  Symptom severity is documented below.  She saying is definitely progressive.  There is no associated chest pain.  She is seeing cardiology and apparently she has been cleared.  She is immunosuppressed.  She is been on methotrexate since 2005 2006 then  approximately in 2012 she got switched to injection form of methotrexate following vertigo with the oral form.  She has been on Orencia for 8 years.  She is not on prednisone or Bactrim.  Most recently in February 2022 she had high-resolution CT chest that shows presence of ILD and that is progressive compared to 2 years earlier.  I personally visualized this film and showed it to the patient.  Therefore she is being referred here.  Last pulmonary function test was in 2015.      CT Chest data 03/05/20   Narrative & Impression  CLINICAL DATA:  76 year old female with history of shortness of breath on exertion. History of rheumatoid arthritis.   EXAM: CT CHEST WITHOUT CONTRAST   TECHNIQUE: Multidetector CT imaging of the chest was performed following the standard protocol without intravenous contrast. High resolution imaging of the lungs, as well as inspiratory and expiratory imaging, was performed.   COMPARISON:  High-resolution chest CT 08/04/2018.   FINDINGS: Cardiovascular: Heart size is normal. There is no significant pericardial fluid, thickening or pericardial calcification. There is aortic atherosclerosis, as well as atherosclerosis of the great vessels of the mediastinum and the coronary arteries, including calcified atherosclerotic plaque in the left main, left anterior descending and right coronary arteries.   Mediastinum/Nodes: No pathologically enlarged mediastinal or hilar lymph nodes. Please note that accurate exclusion of hilar adenopathy is limited on noncontrast CT scans. Esophagus is unremarkable in appearance. No axillary lymphadenopathy.   Lungs/Pleura: High-resolution images again demonstrate minimal ground-glass attenuation in the extreme lung bases. Trace amount of septal thickening noted, most evident in the inferior segment of the lingula and lateral aspect of the left lower lobe. No traction bronchiectasis or honeycombing. Inspiratory and expiratory  imaging demonstrates moderate air trapping indicative of small airways disease. No acute consolidative airspace disease. No pleural effusions. No suspicious appearing pulmonary nodules or masses are noted.   Upper Abdomen: Aortic atherosclerosis.   Musculoskeletal: There are no aggressive appearing lytic or blastic lesions noted in the visualized portions of the skeleton.   IMPRESSION: 1. Very subtle changes suggestive of interstitial lung disease redemonstrated, with minimal progression compared to the prior study. Findings are extremely mild and considered indeterminate for usual interstitial pneumonia (UIP) per current ATS guidelines. If there is persistent clinical concern for interstitial lung disease, repeat high-resolution chest CT is recommended in 12 months to assess for temporal changes in the appearance of the lung parenchyma. 2. Moderate air trapping indicative of small airways disease. 3. Aortic atherosclerosis, in addition to left main and 2 vessel coronary artery disease. Assessment for potential risk factor modification, dietary therapy or pharmacologic therapy may be warranted, if clinically indicated.   Aortic Atherosclerosis (ICD10-I70.0).     Electronically Signed   By: Vinnie Langton M.D.   On: 03/06/2020 08:59        OV 07/11/2020  Subjective:  Patient ID: Deanna Norris, female , DOB: 12/23/1945 , age 56 y.o. , MRN: [id number] , ADDRESS: [address]  [place] [address] PCP Tower, [name], MD Patient Care Team: Abner Greenspan, MD as PCP - General Jarome Matin,  MD as Consulting Physician (Dermatology) Juanita Craver, MD as Consulting Physician (Gastroenterology) Marica Otter, OD as Consulting Physician (Optometry) Belva Crome, MD as Consulting Physician (Cardiology)  This Provider for this visit: Treatment Team:  Attending Provider: Brand Males, MD    07/11/2020 -   Chief Complaint  Patient presents with   Follow-up    Pt  has not used inhalers.    Follow-up shortness of breath in the setting of rheumatoid arthritis and Sjogren's Immunosuppressed in the setting of methotrexate and Orencia History of coronary artery calcification -negative stress test 2015 Echo 2020 -with mild to moderate aortic incompetence Obesity History of prior asthma Early/mild indeterminate for UIP ILD  -2015 no ILD -July 2020 through February 2022 with minimal progression   HPI Deanna Norris 76 y.o. --presents for follow-up.  She is status post bronchoscopy approximately just over a month ago.  The cultures grew Candida it was 75% polys.  We put her on fluconazole she is taking that right now.  No side effects.  She is not noticing any difference in her symptoms.  Suspect it was a contaminant.  No other organisms grown.  She started pulmonary rehabilitation.  His symptom score is unchanged.  She had pulmonary function test shows both FVC and DLCO decline of 5% over 7 years which is 0.7 %/year.  Is a very minimal decline.  Her DLCO currently is normal.  Therefore the ILD is extremely minimal.  Nevertheless she is out of proportion dyspnea and she is very worried about this.  We discussed that dyspnea could be multifactorial based on weight, possible diastolic dysfunction [although echo in 2020 did not specifically reveal this].,  Moderate aortic incompetence, physical deconditioning.  We will have to see how pulmonary rehabilitation works.  She believes asthma is not active she has an albuterol inhaler with her which she does not use and has never felt the need to use it.  There is no fever or chills.     Results for SYNDA, BAGENT (MRN [id number]) as of 07/11/2020 15:46  Ref. Range 06/07/2020 09:30  Monocyte-Macrophage-Serous Fluid Latest Ref Range: 50 - 90 % 8 (L)  Other Cells, Fluid Latest Units: % LINING CELLS PRESENT  Fluid Type-FCT Unknown Bronch Lavag  Color, Fluid Unknown COLORLESS  Total Nucleated Cell Count, Fluid Latest  Ref Range: 0 - 1,000 cu mm 47  Lymphs, Fluid Latest Units: % 16  Eos, Fluid Latest Units: % 2  Appearance, Fluid Latest Ref Range: CLEAR  HAZY (A)  Neutrophil Count, Fluid Latest Ref Range: 0 - 25 % 74 (H)   Fungal result 1 Candida albicans Abnormal     OV 01/11/2021  Subjective:  Patient ID: Deanna Norris, female , DOB: Mar 23, 1945 , age 71 y.o. , MRN: [id number] , ADDRESS: [address] [place] [address] PCP Tower, [name], MD Patient Care Team: Abner Greenspan, MD as PCP - Kennon Portela, MD as Consulting Physician (Dermatology) Juanita Craver, MD as Consulting Physician (Gastroenterology) Marica Otter, OD as Consulting Physician (Optometry) Belva Crome, MD as Consulting Physician (Cardiology)  This Provider for this visit: Treatment Team:  Attending Provider: Brand Males, MD    01/11/2021 -   Chief Complaint  Patient presents with   Follow-up    PFT performed 1/5. Pt states since having a medication change, she has been having joint pain and also states that she has been fatigued more. Also states that she has had some chest discomfort.   Follow-up shortness  of breath in the setting of rheumatoid arthritis and Sjogren's  - ILD -Grade 1 diastolic dysfunction echocardiogram January 2023. Immunosuppressed in the setting of  RA RX  - Orencia  - since 2016 -Methotrexate August 2006 as oral medication and then since 2002 injection methotrexate -> stopped August 2022 -Imuran since August 2022 [due to ILD and rheumatoid arthritis]  -Bronchoscopy June 2022 with 70% polys and Candida.  Status post Diflucan. History of coronary artery calcification -negative stress test 2015  - Echo 2020 -with mild to moderate aortic incompetence Obesity History of prior asthma Early/mild indeterminate for UIP ILD  -2015 no ILD - July 2020 - mild ILD - - FEb 2022 CT - minimal ILD with Air trapping. INdeterminate for UIP Patter - - Jan 2023 - progession in PFT HPI Deanna  B Norris 76 y.o. -returns for follow-up.  She presents with her husband.  She tells me that she has had a good Christmas.  She had 13 people over in the house and she is able to cook.  She is currently trying to start a low carbohydrate diet and achieve some weight loss.  She states recently that she is having active acid reflux.  She is on Pepcid but this seems to be insufficient.  The low carbohydrate diet is not fully kicked in.  She has never tried PPI.  In the interim she did attend pulmonary rehabilitation and it helped.  Of note back in August 2022 after her bronchoscopy given her ILD and we recommended change of her methotrexate to Imuran.  She is now on Imuran and tolerating it well.  However she feels that her joint pain and rheumatoid arthritis is more active.  It is still not at the level where it is interfering with her quality of life but she can definitely feel it.  She would prefer to go back to methotrexate.  We did discuss the possibility of methotrexate lung toxicity.  Current literature shows the incidence of this is less than 1%.  Typically patients have lymphocytosis.  Her bronchoscopy showed more neutrophilia.  Therefore I did express to her that the evidence that she has methotrexate lung toxicity despite being on it for many years is very low.  Did support the idea that priority would be to control the rheumatoid arthritis joint symptoms  In terms of ILD: She had pulmonary function test and what is concerning here is that her pulmonary function test shows decline compared to July 2022.  She also had an echocardiogram that just shows grade 1 diastolic dysfunction.  We did discuss the possibility that acid reflux be contributing to decline in lung function   SYMPTOM SCALE - ILD 04/04/2020  07/11/2020 Nw in rehab  June 2022 0 BAL > 70% polys and grew candida 01/11/2021   O2 use ra  ra  Shortness of Breath 0 -> 5 scale with 5 being worst (score 6 If unable to do)    At rest 0 0 0   Simple tasks - showers, clothes change, eating, shaving 0 0 0  Household (dishes, doing bed, laundry) _0 Shopping 1 1 0  Walking level at own pace 1 1 0  Walking up Stairs _1 Total (30-36) Dyspnea Score _2 How bad is your cough? 0 0 1  How bad is your fatigue _3 How bad is nausea _4 How bad is vomiting?  0 0 0  How bad is  diarrhea? 0 0 0  How bad is anxiety? 0 0 00  How bad is depression 0 0 0    Simple office walk 185 feet x  3 laps goal with forehead probe 04/04/2020  01/11/2021    O2 used ra ra  Number laps completed 3 3  Comments about pace mod brisk  Resting Pulse Ox/HR 100% and 60/min 99% and 64  Final Pulse Ox/HR 95% and 83/min 96% and 90  Desaturated </= 88% no no  Desaturated <= 3% points yesm 5 Yes, 3  Got Tachycardic >/= 90/min bo yes  Symptoms at end of test Mild dyspena Mild dyspnea  Miscellaneous comments x    CT Chest data  ECHOCARDIOGRAM COMPLETE  Result Date: 01/08/2021    ECHOCARDIOGRAM REPORT   Patient Name:   Deanna Norris Date of Exam: 01/08/2021 Medical Rec #:  [id number]          Height:       66.0 in Accession #:    [id number]         Weight:       183.0 lb Date of Birth:  1945/08/30         BSA:          1.926 m Patient Age:    45 years           BP:           144/68 mmHg Patient Gender: F                  HR:           62 bpm. Exam Location:  [facility] Procedure: 2D Echo, 3D Echo, Cardiac Doppler, Color Doppler and Strain Analysis Indications:    R06.02 Shortness of breath  History:        Patient has prior history of Echocardiogram examinations, most                 recent 12/21/2018. CAD, Signs/Symptoms:Shortness of Breath; Risk                 Factors:Sleep Apnea. Sjogren's syndrome.  Sonographer:    Basilia Jumbo BS, RDCS Referring Phys: Brand Males IMPRESSIONS  1. Left ventricular ejection fraction, by estimation, is 60 to 65%. The left ventricle has normal function. The left ventricle has no regional wall motion  abnormalities. Left ventricular diastolic parameters are consistent with Grade I diastolic dysfunction (impaired relaxation). The average left ventricular global longitudinal strain is -24.3 %. The global longitudinal strain is normal.  2. Right ventricular systolic function is normal. The right ventricular size is normal. There is normal pulmonary artery systolic pressure.  3. Left atrial size was mildly dilated.  4. The mitral valve is normal in structure. Trivial mitral valve regurgitation. No evidence of mitral stenosis.  5. The aortic valve is tricuspid. There is mild calcification of the aortic valve. There is mild thickening of the aortic valve. Aortic valve regurgitation is mild. Aortic valve sclerosis is present, with no evidence of aortic valve stenosis.  6. Aortic dilatation noted. There is mild dilatation of the ascending aorta, measuring 38 mm.  7. The inferior vena cava is normal in size with greater than 50% respiratory variability, suggesting right atrial pressure of 3 mmHg. Comparison(s): 12/21/18 EF 68%. GLS-20.6%. PA pressure 22mHg. FINDINGS  Left Ventricle: Left ventricular ejection fraction, by estimation, is 60 to 65%. The left ventricle has normal function. The left ventricle has no regional wall motion abnormalities. The average  left ventricular global longitudinal strain is -24.3 %. The global longitudinal strain is normal. The left ventricular internal cavity size was normal in size. There is no left ventricular hypertrophy. Left ventricular diastolic parameters are consistent with Grade I diastolic dysfunction (impaired relaxation). Indeterminate filling pressures. Right Ventricle: The right ventricular size is normal. No increase in right ventricular wall thickness. Right ventricular systolic function is normal. There is normal pulmonary artery systolic pressure. The tricuspid regurgitant velocity is 2.62 m/s, and  with an assumed right atrial pressure of 3 mmHg, the estimated right  ventricular systolic pressure is 10.6 mmHg. Left Atrium: Left atrial size was mildly dilated. Right Atrium: Right atrial size was normal in size. Pericardium: There is no evidence of pericardial effusion. Mitral Valve: The mitral valve is normal in structure. Trivial mitral valve regurgitation. No evidence of mitral valve stenosis. Tricuspid Valve: The tricuspid valve is normal in structure. Tricuspid valve regurgitation is mild . No evidence of tricuspid stenosis. Aortic Valve: The aortic valve is tricuspid. There is mild calcification of the aortic valve. There is mild thickening of the aortic valve. Aortic valve regurgitation is mild. Aortic regurgitation PHT measures 712 msec. Aortic valve sclerosis is present,  with no evidence of aortic valve stenosis. Pulmonic Valve: The pulmonic valve was normal in structure. Pulmonic valve regurgitation is not visualized. No evidence of pulmonic stenosis. Aorta: Aortic dilatation noted. There is mild dilatation of the ascending aorta, measuring 38 mm. Venous: The inferior vena cava is normal in size with greater than 50% respiratory variability, suggesting right atrial pressure of 3 mmHg. IAS/Shunts: No atrial level shunt detected by color flow Doppler.  LEFT VENTRICLE PLAX 2D LVIDd:         4.70 cm   Diastology LVIDs:         3.00 cm   LV e' medial:    8.03 cm/s LV PW:         0.90 cm   LV E/e' medial:  9.5 LV IVS:        0.90 cm   LV e' lateral:   9.00 cm/s LVOT diam:     2.10 cm   LV E/e' lateral: 8.5 LV SV:         92 LV SV Index:   48        2D Longitudinal Strain LVOT Area:     3.46 cm  2D Strain GLS (A2C):   -19.7 %                          2D Strain GLS (A3C):   -30.6 %                          2D Strain GLS (A4C):   -22.5 %                          2D Strain GLS Avg:     -24.3 %                           3D Volume EF:                          3D EF:        61 %  LV EDV:       145 ml                          LV ESV:       56 ml                           LV SV:        88 ml RIGHT VENTRICLE             IVC RV Basal diam:  3.20 cm     IVC diam: 2.10 cm RV S prime:     16.10 cm/s TAPSE (M-mode): 3.3 cm RVSP:           35.5 mmHg LEFT ATRIUM             Index        RIGHT ATRIUM           Index LA diam:        4.50 cm 2.34 cm/m   RA Pressure: 8.00 mmHg LA Vol (A2C):   61.8 ml 32.09 ml/m  RA Area:     11.70 cm LA Vol (A4C):   57.3 ml 29.76 ml/m  RA Volume:   23.20 ml  12.05 ml/m LA Biplane Vol: 60.8 ml 31.57 ml/m  AORTIC VALVE LVOT Vmax:   107.00 cm/s LVOT Vmean:  75.300 cm/s LVOT VTI:    0.267 m AI PHT:      712 msec  AORTA Ao Root diam: 3.40 cm Ao Asc diam:  3.80 cm MITRAL VALVE               TRICUSPID VALVE                            TR Peak grad:   27.5 mmHg MV Decel Time: 219 msec    TR Vmax:        262.00 cm/s MV E velocity: 76.45 cm/s  Estimated RAP:  8.00 mmHg MV A velocity: 83.90 cm/s  RVSP:           35.5 mmHg MV E/A ratio:  0.91                            SHUNTS                            Systemic VTI:  0.27 m                            Systemic Diam: 2.10 cm Skeet Latch MD Electronically signed by Skeet Latch MD Signature Date/Time: 01/08/2021/10:08:53 AM    Final       PFT  PFT Results Latest Ref Rng & Units 01/10/2021 07/11/2020 09/01/2013  FVC-Pre L 2.47 2.65 2.78  FVC-Predicted Pre % 80 85 83  FVC-Post L - 2.59 2.86  FVC-Predicted Post % - 83 86  Pre FEV1/FVC % % 75 75 73  Post FEV1/FCV % % - 81 80  FEV1-Pre L 1.86 2.00 2.03  FEV1-Predicted Pre % 80 85 80  FEV1-Post L - 2.11 2.29  DLCO uncorrected ml/min/mmHg 17.40 19.17 20.14  DLCO UNC% % 85 93 74  DLCO corrected ml/min/mmHg 17.40 17.93 -  DLCO COR %Predicted % 85 87 -  DLVA Predicted % 98 99 99  TLC L - 4.66 5.47  TLC % Predicted % - 86 102  RV % Predicted % - 71 101       OV 02/28/2021  Subjective:  Patient ID: Deanna Norris, female , DOB: 05/12/1945 , age 28 y.o. , MRN: [id number] , ADDRESS: [place] [address] PCP Tower, [name], MD Patient Care Team: Tower, [name], MD as PCP - Kennon Portela, MD as Consulting Physician (Dermatology) Juanita Craver, MD as Consulting Physician (Gastroenterology) Marica Otter, OD as Consulting Physician (Optometry) Belva Crome, MD as Consulting Physician (Cardiology)  This Provider for this visit: Treatment Team:  Attending Provider: Brand Males, MD    02/28/2021 -   Chief Complaint  Patient presents with   Follow-up    Pt is here to discuss results of recent CT.    Follow-up shortness of breath in the setting of rheumatoid arthritis and Sjogren's  - ILD -Grade 1 diastolic dysfunction echocardiogram January 2023. Immunosuppressed in the setting of  RA RX  - Orencia  - since 2016 -Methotrexate August 2006 as oral medication and then since 2002 injection methotrexate -> stopped August 2022 -Imuran since August 2022 [due to ILD and rheumatoid arthritis]  -Bronchoscopy June 2022 with 70% polys and Candida.  Status post Diflucan. History of coronary artery calcification -negative stress test 2015  - Echo 2020 -with mild to moderate aortic incompetence Obesity History of prior asthma Early/mild indeterminate for UIP ILD  -2015 no ILD - July 2020 - mild ILD - - FEb 2022 CT - minimal ILD with Air trapping. INdeterminate for UIP Patter - - Jan 2023 - progession in PFT but still normal  - FEb 2023 CT scan: Progression since 2015 but stable since February 2022.  Overall burden is very mild  GERD - starte PPPI JAn 2023  HPI Deanna Norris 76 y.o. -returns for follow-up to discuss CT scan and acid reflux..  Last visit there was concern of pulmonary function test was declining though DLCO was normal.  We got a high-resolution CT chest.  Compared to 1 year ago there is no progression.  In fact even on the current CT scan of the chest that I personally visualized and showed it to her the overall disease burden is very low.  She feels stable.  She did take acid  reflux medication Zegerid which she has been taking.  The acid reflux is better controlled.  However she is worried about osteoporosis risk especially after her recent DEXA scan showing some impairment in bone density.  We did discuss about dietary control and also taking H2 blockade.  She is going to try this approach.  Also at last visit she felt the Imuran was not controlling her pain well.  I recommended she talk to Dr. Keturah Barre and consider switching to methotrexate.  However since then her pain control is much better with Orencia and Imuran.  She is content continuing this.  She also said that Dr. Keturah Barre defers some of the decisions on immunomodulatory medications to me.  I told her this was fine.    CT Chest data - HRCT JA [id number]  Narrative & Impression  CLINICAL DATA:  Interstitial lung disease history of rheumatoid arthritis   EXAM: CT CHEST WITHOUT CONTRAST   TECHNIQUE: Multidetector CT imaging of the chest was performed following the standard protocol without intravenous contrast. High resolution imaging of the lungs, as well as inspiratory and expiratory imaging,  was performed.   RADIATION DOSE REDUCTION: This exam was performed according to the departmental dose-optimization program which includes automated exposure control, adjustment of the mA and/or kV according to patient size and/or use of iterative reconstruction technique.   COMPARISON:  03/05/2020, 08/04/2018, 09/20/2013.   FINDINGS: Cardiovascular: Aortic atherosclerosis. Normal heart size. Left coronary artery calcifications. No pericardial effusion.   Mediastinum/Nodes: No enlarged mediastinal, hilar, or axillary lymph nodes. Mild tracheobronchomalacia on expiratory phase imaging. Thyroid gland and esophagus demonstrate no significant findings.   Lungs/Pleura: Unchanged, very mild bibasilar predominant irregular peripheral interstitial opacity and septal thickening without clear evidence of subpleural bronchiolectasis  or honeycombing. This is not significantly changed compared to prior examination dated 03/05/2020 and minimally worsened over time on examinations dating back to 09/20/2013. Lobular air trapping on expiratory phase imaging. Unchanged elevation of the right hemidiaphragm. No pleural effusion or pneumothorax.   Upper Abdomen: No acute abnormality.   Musculoskeletal: No chest wall abnormality. No suspicious osseous lesions identified.   IMPRESSION: 1. Very mild bibasilar predominant irregular peripheral interstitial opacity and septal thickening without clear evidence of subpleural bronchiolectasis or honeycombing. This is not significantly changed compared to prior examination dated 03/05/2020 and minimally worsened over time on examinations dating back to 09/20/2013. Findings remain in an early "indeterminate for UIP" pattern of fibrosis per consensus guidelines, differential considerations including both UIP and NSIP: Diagnosis of Idiopathic Pulmonary Fibrosis: An Official ATS/ERS/JRS/ALAT Clinical Practice Guideline. [place], Iss 5, [phone], Sep 06 2016. 2. Lobular air trapping on expiratory phase imaging, consistent with small airways disease. 3. Mild tracheobronchomalacia on expiratory phase imaging. 4. Coronary artery disease.   Aortic Atherosclerosis (ICD10-I70.0).     Electronically Signed   By: Delanna Ahmadi M.D.   On: 01/19/2021 20:43    No results found.    PFT  PFT Results Latest Ref Rng & Units 01/10/2021 07/11/2020 09/01/2013  FVC-Pre L 2.47 2.65 2.78  FVC-Predicted Pre % 80 85 83  FVC-Post L - 2.59 2.86  FVC-Predicted Post % - 83 86  Pre FEV1/FVC % % 75 75 73  Post FEV1/FCV % % - 81 80  FEV1-Pre L 1.86 2.00 2.03  FEV1-Predicted Pre % 80 85 80  FEV1-Post L - 2.11 2.29  DLCO uncorrected ml/min/mmHg 17.40 19.17 20.14  DLCO UNC% % 85 93 74  DLCO corrected ml/min/mmHg 17.40 17.93 -  DLCO COR %Predicted % 85 87 -  DLVA Predicted %  98 99 99  TLC L - 4.66 5.47  TLC % Predicted % - 86 102  RV % Predicted % - 71 101       has a past medical history of Asthma, Constipation, Fibroid, GERD (gastroesophageal reflux disease), Insomnia, Interstitial lung disease (HCC), Lumbar vertebral fracture (HCC), Menopausal symptoms, Osteoporosis, Postmenopausal HRT (hormone replacement therapy), RA (rheumatoid arthritis) ([place]) (11/2004), Rosacea, Sjogren's disease ([place]), Sleep apnea (2008), and Vitamin D deficiency.   reports that she has never smoked. She has never used smokeless tobacco.  Past Surgical History:  Procedure Laterality Date   ABDOMINAL HYSTERECTOMY  01/1993   partial with AP repair, secondary to fibroids   BREAST SURGERY  1991   breast biopsy   BRONCHIAL WASHINGS  06/07/2020   Procedure: BRONCHIAL WASHINGS;  Surgeon: Brand Males, MD;  Location: [facility];  Service: Endoscopy;;   CYSTOCELE REPAIR  10/2007   with graft   MELANOMA EXCISION Right 01/2012   excision right posterior arm   NASAL SINUS SURGERY  07/1991  VAGINAL DELIVERY     x3   VIDEO BRONCHOSCOPY N/A 06/07/2020   Procedure: VIDEO BRONCHOSCOPY WITHOUT FLUORO;  Surgeon: Brand Males, MD;  Location: [facility];  Service: Endoscopy;  Laterality: N/A;    Allergies  Allergen Reactions   Adalimumab     REACTION: frequent sinus infection   Diclofenac     unk   Dicyclomine     Turn teeth gray   Doxycycline Hyclate Other (See Comments)    "turns teeth gray"   Enbrel [Etanercept]     SINUS INFECTIONS   Fosamax [Alendronate Sodium]     Acid reflux   Methotrexate Derivatives Nausea Only    Oral metotrexate   Nickel    Piroxicam     unk   Salagen [Pilocarpine Hcl] Other (See Comments)    Excessive sweating, headaches   Sulfamethoxazole-Trimethoprim     REACTION: face and hands rash and felt burning sensation   Tramadol Hcl     REACTION: nausea   Plaquenil [Hydroxychloroquine] Rash    Immunization History  Administered Date(s)  Administered   DTaP 04/06/2013   Influenza Split 10/22/2010   Influenza Whole 10/10/2009, 10/20/2011   Influenza, High Dose Seasonal PF 10/14/2016, 10/08/2018, 10/07/2019   Influenza, Seasonal, Injecte, Preservative Fre 10/06/2012   Influenza,inj,Quad PF,6+ Mos 10/30/2014   Influenza,inj,quad, With Preservative 10/12/2017   Influenza-Unspecified 10/26/2013, 10/22/2015, 11/02/2020   PFIZER(Purple Top)SARS-COV-2 Vaccination 06/02/2019, 08/26/2019   Pneumococcal Conjugate-13 02/13/2014   Pneumococcal Polysaccharide-23 06/12/2010, 03/28/2015   Td 02/07/2003, 03/23/2013   Tdap 03/23/2013   Zoster, Live 05/08/2014    Family History  Problem Relation Age of Onset   Breast cancer Sister 69   Thyroid disease Sister    Cancer Sister 65       Leukemia--CLL   Heart disease Brother      Current Outpatient Medications:    acetaminophen (TYLENOL) 500 MG tablet, Take 500-1,000 mg by mouth every 8 (eight) hours as needed for moderate pain., Disp: , Rfl:    albuterol (PROAIR HFA) 108 (90 Base) MCG/ACT inhaler, Inhale 2 puffs into the lungs every 4 (four) hours as needed for wheezing or shortness of breath., Disp: 18 g, Rfl: 3   Ascorbic Acid (VITAMIN C PO), Take 500 mg by mouth daily., Disp: , Rfl:    aspirin 81 MG tablet, Take 81 mg by mouth daily., Disp: , Rfl:    azaTHIOprine (IMURAN) 50 MG tablet, Take 2 tablets (100 mg total) by mouth daily., Disp: 180 tablet, Rfl: 0   budesonide (PULMICORT) 180 MCG/ACT inhaler, Inhale 1 puff into the lungs daily as needed (asthma)., Disp: , Rfl:    Calcium Carbonate-Vitamin D (CALCIUM 500/D PO), Take 1 tablet by mouth 2 (two) times daily., Disp: , Rfl:    Cholecalciferol (VITAMIN D3) 2000 UNITS TABS, Take 2,000 Units by mouth daily., Disp: , Rfl:    cycloSPORINE (RESTASIS) 0.05 % ophthalmic emulsion, Place 1 drop into both eyes 2 (two) times daily., Disp: , Rfl:    desonide (DESOWEN) 0.05 % lotion, Apply 1 application topically daily as needed (irritation).,  Disp: , Rfl:    diclofenac Sodium (VOLTAREN) 1 % GEL, Apply 2-4 grams to affected joint 4 times daily as needed. (Patient taking differently: Apply 2-4 g topically 4 (four) times daily as needed (pain). Apply 2-4 grams to affected joint 4 times daily as needed.), Disp: 400 g, Rfl: 2   Estradiol (VAGIFEM) 10 MCG TABS vaginal tablet, Place 1 tablet (10 mcg total) vaginally 2 (two) times a week. Place  one tablet (10 mcg) per vagina at bedtime twice a week., Disp: 24 tablet, Rfl: 3   fluocinonide (LIDEX) 0.05 % external solution, Apply topically., Disp: , Rfl:    fluticasone (FLONASE) 50 MCG/ACT nasal spray, USE 2 SPRAYS IN EACH NOSTRIL DAILY AS NEEDED FOR RHINITIS OR ALLERGIES (Patient taking differently: Place 2 sprays into both nostrils daily as needed for allergies. USE 2 SPRAYS IN EACH NOSTRIL DAILY AS NEEDED FOR RHINITIS OR ALLERGIES), Disp: 48 g, Rfl: 3   ibuprofen (ADVIL) 200 MG tablet, Take 200-400 mg by mouth every 8 (eight) hours as needed (pain)., Disp: , Rfl:    Magnesium Malate 1250 (141.7 Mg) MG TABS, Take 3 tablets by mouth daily. , Disp: , Rfl:    Multiple Vitamin (MULTI-VITAMINS) TABS, Take 1 tablet by mouth daily., Disp: , Rfl:    Omega-3 1000 MG CAPS, Take 1,000 mg by mouth daily., Disp: , Rfl:    Omeprazole-Sodium Bicarbonate (ZEGERID) 20-1100 MG CAPS capsule, , Disp: , Rfl:    ORENCIA 125 MG/ML SOSY, INJECT THE CONTENTS OF 1 SYRINGE UNDER THE SKIN EVERY WEEK, Disp: 4 mL, Rfl: 2   Propylene Glycol (SYSTANE BALANCE) 0.6 % SOLN, Place 1 drop into both eyes daily as needed (dry eyes)., Disp: , Rfl:    triamcinolone cream (KENALOG) 0.1 %, SMARTSIG:1 Application Topical 2-3 Times Daily, Disp: , Rfl:    Zinc 50 MG TABS, Take 50 mg by mouth daily., Disp: , Rfl:    zoledronic acid (RECLAST) 5 MG/100ML SOLN injection, Inject 5 mg into the vein once. Takes yearly, Disp: , Rfl:    zolpidem (AMBIEN) 5 MG tablet, Take 0.5 tablets (2.5 mg total) by mouth at bedtime as needed for sleep., Disp: 15  tablet, Rfl: 0      Objective:   Vitals:   02/28/21 1330  BP: 126/68  Pulse: 65  Temp: 97.7 F (36.5 C)  TempSrc: Oral  SpO2: 96%  Weight: 173 lb 3.2 oz (78.6 kg)  Height: 5' 6.5" (1.689 m)    Estimated body mass index is 27.54 kg/m as calculated from the following:   Height as of this encounter: 5' 6.5" (1.689 m).   Weight as of this encounter: 173 lb 3.2 oz (78.6 kg).  _0 @  Filed Weights   02/28/21 1330  Weight: 173 lb 3.2 oz (78.6 kg)     Physical Exam  General: No distress. Looks well Neuro: Alert and Oriented x 3. GCS 15. Speech normal Psych: Pleasant Resp:  Barrel Chest - no.  Wheeze - no, Crackles - no, No overt respiratory distress CVS: Normal heart sounds. Murmurs - no Ext: Stigmata of Connective Tissue Disease - no HEENT: Normal upper airway. PEERL +. No post nasal drip        Assessment:       ICD-10-CM   1. ILD (interstitial lung disease) (HCC)  J84.9 Pulmonary function test    2. 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 History of rheumatoid arthritis  Z87.39     4. Immunosuppressed status ([place])  D84.9     5. Gastroesophageal reflux disease, unspecified whether esophagitis present  K21.9          Plan:     Patient Instructions     ICD-10-CM   1. 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 ILD (interstitial lung disease) ([place])  J84.9  3. History of rheumatoid arthritis  Z87.39   4. Immunosuppressed status ([place])  D84.9     You have onset of interstitial lung diease (ILD) in 2020. PFT and CT though suggest progression the overall burden is very mild and in lats 1 year (Feb 2022 -. 2023 feb) no progression on HRCT  Gla  RA joints are improved with Orencia and Immuran and immuran finally kicked  Active GERD - improved with PPI but you have concerns on bone health with PPI   Plan [shared decision making] --Recommend acid reflux  control through low carbohydrate diet and also  H2 blockade  - Can stop Zegerid - do spirometry and dlco in 6-8 months - continue orencia and immuran with monitoring by Dr D -hold off on antifibrotic for interstitial lung disease given overall mild disease burden and side effect profile    Follow-up   -6-8 months after PFT     SIGNATURE    Dr. Brand Males, M.D., F.C.C.P,  Pulmonary and Critical Care Medicine Staff Physician, [place] Director - Interstitial Lung Disease  Program  [facility] [place], [place], [address]  Pager: [phone], If no answer or between  15:00h - 7:00h: call [phone] Telephone: [phone]  2:22 PM 02/28/2021

## 2021-02-28 NOTE — Patient Instructions (Addendum)
ICD-10-CM   1. 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 ILD (interstitial lung disease) ([place])  J84.9   3. History of rheumatoid arthritis  Z87.39   4. Immunosuppressed status ([place])  D84.9     You have onset of interstitial lung diease (ILD) in 2020. PFT and CT though suggest progression the overall burden is very mild and in lats 1 year (Feb 2022 -. 2023 feb) no progression on HRCT  Gla  RA joints are improved with Orencia and Immuran and immuran finally kicked  Active GERD - improved with PPI but you have concerns on bone health with PPI   Plan [shared decision making] --Recommend acid reflux control through low carbohydrate diet and also  H2 blockade  - Can stop Zegerid - do spirometry and dlco in 6-8 months - continue orencia and immuran with monitoring by Dr D -hold off on antifibrotic for interstitial lung disease given overall mild disease burden and side effect profile    Follow-up   -6-8 months after PFT

## 2021-03-07 ENCOUNTER — Encounter: Payer: Self-pay | Admitting: Family Medicine

## 2021-03-13 ENCOUNTER — Other Ambulatory Visit: Payer: Self-pay

## 2021-03-13 MED ORDER — ZOLPIDEM TARTRATE 5 MG PO TABS: 2.5000 mg | ORAL_TABLET | Freq: Every evening | ORAL | 0 refills | Status: DC | PRN

## 2021-03-13 NOTE — Telephone Encounter (Signed)
Patient requested prescription refill of Imuran to be sent to [facility]. ?And ?prescription refill of Ambien to be sent to [facility].   ?

## 2021-03-13 NOTE — Telephone Encounter (Signed)
Patient advised to early to refill Imuran as a 90 day supply was sent to the pharmacy on 01/31/2021. Patient expressed understanding.  ?

## 2021-03-13 NOTE — Telephone Encounter (Signed)
Next Visit: 04/22/2021 ?  ?Last Visit: 01/22/2021 ?  ?Last Fill: 02/21/2021 ?  ?Okay to refill Ambien? ?

## 2021-03-14 ENCOUNTER — Other Ambulatory Visit: Payer: Self-pay

## 2021-03-14 ENCOUNTER — Encounter: Payer: Self-pay | Admitting: Family

## 2021-03-14 ENCOUNTER — Encounter: Payer: Self-pay | Admitting: Family Medicine

## 2021-03-14 ENCOUNTER — Telehealth (INDEPENDENT_AMBULATORY_CARE_PROVIDER_SITE_OTHER): Payer: Medicare Other | Admitting: Family

## 2021-03-14 ENCOUNTER — Other Ambulatory Visit: Payer: Medicare Other

## 2021-03-14 VITALS — Temp 99.3°F | Ht 66.0 in | Wt 170.0 lb

## 2021-03-14 DIAGNOSIS — Z20822 Contact with and (suspected) exposure to covid-19: Secondary | ICD-10-CM | POA: Diagnosis not present

## 2021-03-14 DIAGNOSIS — J029 Acute pharyngitis, unspecified: Secondary | ICD-10-CM

## 2021-03-14 DIAGNOSIS — J069 Acute upper respiratory infection, unspecified: Secondary | ICD-10-CM

## 2021-03-14 LAB — POC COVID19 BINAXNOW: SARS Coronavirus 2 Ag: NEGATIVE

## 2021-03-14 LAB — POCT RAPID STREP A (OFFICE): Rapid Strep A Screen: NEGATIVE

## 2021-03-14 MED ORDER — AMOXICILLIN-POT CLAVULANATE 875-125 MG PO TABS
1.0000 | ORAL_TABLET | Freq: Two times a day (BID) | ORAL | 0 refills | Status: AC
Start: 2021-03-14 — End: 2021-03-24

## 2021-03-14 NOTE — Assessment & Plan Note (Signed)
covid test ordered pending results ?

## 2021-03-14 NOTE — Progress Notes (Signed)
MyChart Video Visit    Virtual Visit via Video Note   This visit type was conducted due to national recommendations for restrictions regarding the COVID-19 Pandemic (e.g. social distancing) in an effort to limit this patient's exposure and mitigate transmission in our community. This patient is at least at moderate risk for complications without adequate follow up. This format is felt to be most appropriate for this patient at this time. Physical exam was limited by quality of the video and audio technology used for the visit. CMA was able to get the patient set up on a video visit.  Patient location: Home. Patient and provider in visit Provider location: Office  I discussed the limitations of evaluation and management by telemedicine and the availability of in person appointments. The patient expressed understanding and agreed to proceed.  Visit Date: 03/14/2021  Today's healthcare provider: Eugenia Pancoast, FNP     Subjective:    Patient ID: Deanna Norris, female    DOB: [birth year], 76 y.o.   MRN: [id number]  Chief Complaint  Patient presents with   Sore Throat    C/o ST, cough, chest congestion, body aches and runny nose.  Started 03/07/21.  Tried Sudafed, Nyquil and ibuprofen.     Sore Throat  Associated symptoms include congestion and coughing. Pertinent negatives include no ear pain (bil ear fullness).  76 y/o with symptoms that started one week ago.  Does have rheumatoid arthritis and ILD, states symptoms seem to be getting a bit worse.  Started with sore throat, that has since progress to a lot of coughing and chest congestion.  99.3 fever for her is higher than normal for her. She is immunocompromised, often with chills. Some sinus pressure some bil ear fullness, some nasal congestion.   Has not tested for covid.   Pt has temporarily d/c her orencia as well as her imuran while she is sick. She will restart per her rheumatologist once feeling better.  Past  Medical History:  Diagnosis Date   Asthma    Constipation    Fibroid    GERD (gastroesophageal reflux disease)    Insomnia    Interstitial lung disease (HCC)    Lumbar vertebral fracture (HCC)    L5-S1   Menopausal symptoms    Osteoporosis    Postmenopausal HRT (hormone replacement therapy)    RA (rheumatoid arthritis) ([place]) 11/2004   methotrexate and Embrel   Rosacea    Sjogren's disease ([place])    Sleep apnea 2008   C-Pap   Vitamin D deficiency     Past Surgical History:  Procedure Laterality Date   ABDOMINAL HYSTERECTOMY  01/1993   partial with AP repair, secondary to fibroids   BREAST SURGERY  1991   breast biopsy   BRONCHIAL WASHINGS  06/07/2020   Procedure: BRONCHIAL WASHINGS;  Surgeon: Brand Males, MD;  [facility];  Service: Endoscopy;;   CYSTOCELE REPAIR  10/2007   with graft   MELANOMA EXCISION Right 01/2012   excision right posterior arm   NASAL SINUS SURGERY  07/1991   VAGINAL DELIVERY     x3   VIDEO BRONCHOSCOPY N/A 06/07/2020   Procedure: VIDEO BRONCHOSCOPY WITHOUT FLUORO;  Surgeon: Brand Males, MD;  [facility];  Service: Endoscopy;  Laterality: N/A;    Family History  Problem Relation Age of Onset   Breast cancer Sister 17   Thyroid disease Sister    Cancer Sister 68       Leukemia--CLL   Heart disease  Brother     Social History   Socioeconomic History   Marital status: Married    Spouse name: Not on file   Number of children: Not on file   Years of education: Not on file   Highest education level: Not on file  Occupational History   Occupation: housewife    Employer: Not Employed  Tobacco Use   Smoking status: Never   Smokeless tobacco: Never  Vaping Use   Vaping Use: Never used  Substance and Sexual Activity   Alcohol use: No    Alcohol/week: 0.0 standard drinks   Drug use: No   Sexual activity: Yes    Partners: Male    Birth control/protection: Surgical    Comment: TAH  Other Topics Concern   Not on  file  Social History Narrative   Not on file   Social Determinants of Health   Financial Resource Strain: Not on file  Food Insecurity: Not on file  Transportation Needs: Not on file  Physical Activity: Not on file  Stress: Not on file  Social Connections: Not on file  Intimate Partner Violence: Not on file    Outpatient Medications Prior to Visit  Medication Sig Dispense Refill   acetaminophen (TYLENOL) 500 MG tablet Take 500-1,000 mg by mouth every 8 (eight) hours as needed for moderate pain.     albuterol (PROAIR HFA) 108 (90 Base) MCG/ACT inhaler Inhale 2 puffs into the lungs every 4 (four) hours as needed for wheezing or shortness of breath. 18 g 3   Ascorbic Acid (VITAMIN C PO) Take 500 mg by mouth daily.     aspirin 81 MG tablet Take 81 mg by mouth daily.     budesonide (PULMICORT) 180 MCG/ACT inhaler Inhale 1 puff into the lungs daily as needed (asthma).     Calcium Carbonate-Vitamin D (CALCIUM 500/D PO) Take 1 tablet by mouth 2 (two) times daily.     Cholecalciferol (VITAMIN D3) 2000 UNITS TABS Take 2,000 Units by mouth daily.     cycloSPORINE (RESTASIS) 0.05 % ophthalmic emulsion Place 1 drop into both eyes 2 (two) times daily.     desonide (DESOWEN) 0.05 % lotion Apply 1 application topically daily as needed (irritation).     diclofenac Sodium (VOLTAREN) 1 % GEL Apply 2-4 grams to affected joint 4 times daily as needed. (Patient taking differently: Apply 2-4 g topically 4 (four) times daily as needed (pain). Apply 2-4 grams to affected joint 4 times daily as needed.) 400 g 2   Estradiol (VAGIFEM) 10 MCG TABS vaginal tablet Place 1 tablet (10 mcg total) vaginally 2 (two) times a week. Place one tablet (10 mcg) per vagina at bedtime twice a week. 24 tablet 3   fluocinonide (LIDEX) 0.05 % external solution Apply topically.     fluticasone (FLONASE) 50 MCG/ACT nasal spray USE 2 SPRAYS IN EACH NOSTRIL DAILY AS NEEDED FOR RHINITIS OR ALLERGIES (Patient taking differently: Place 2  sprays into both nostrils daily as needed for allergies. USE 2 SPRAYS IN EACH NOSTRIL DAILY AS NEEDED FOR RHINITIS OR ALLERGIES) 48 g 3   ibuprofen (ADVIL) 200 MG tablet Take 200-400 mg by mouth every 8 (eight) hours as needed (pain).     Magnesium Malate 1250 (141.7 Mg) MG TABS Take 3 tablets by mouth daily.      Multiple Vitamin (MULTI-VITAMINS) TABS Take 1 tablet by mouth daily.     Omega-3 1000 MG CAPS Take 1,000 mg by mouth daily.     Omeprazole-Sodium Bicarbonate (  ZEGERID) 20-1100 MG CAPS capsule      Propylene Glycol (SYSTANE BALANCE) 0.6 % SOLN Place 1 drop into both eyes daily as needed (dry eyes).     triamcinolone cream (KENALOG) 0.1 % SMARTSIG:1 Application Topical 2-3 Times Daily     Zinc 50 MG TABS Take 50 mg by mouth daily.     zoledronic acid (RECLAST) 5 MG/100ML SOLN injection Inject 5 mg into the vein once. Takes yearly     zolpidem (AMBIEN) 5 MG tablet Take 0.5 tablets (2.5 mg total) by mouth at bedtime as needed for sleep. 15 tablet 0   azaTHIOprine (IMURAN) 50 MG tablet Take 2 tablets (100 mg total) by mouth daily. (Patient not taking: Reported on 03/14/2021) 180 tablet 0   ORENCIA 125 MG/ML SOSY INJECT THE CONTENTS OF 1 SYRINGE UNDER THE SKIN EVERY WEEK (Patient not taking: Reported on 03/14/2021) 4 mL 2   No facility-administered medications prior to visit.    Allergies  Allergen Reactions   Adalimumab     REACTION: frequent sinus infection   Diclofenac     unk   Dicyclomine     Turn teeth gray   Doxycycline Hyclate Other (See Comments)    "turns teeth gray"   Enbrel [Etanercept]     SINUS INFECTIONS   Fosamax [Alendronate Sodium]     Acid reflux   Methotrexate Derivatives Nausea Only    Oral metotrexate   Nickel    Piroxicam     unk   Salagen [Pilocarpine Hcl] Other (See Comments)    Excessive sweating, headaches   Sulfamethoxazole-Trimethoprim     REACTION: face and hands rash and felt burning sensation   Tramadol Hcl     REACTION: nausea   Plaquenil  [Hydroxychloroquine] Rash    Review of Systems  Constitutional:  Positive for chills. Negative for fever (low grade for her).  HENT:  Positive for congestion and sore throat. Negative for ear pain (bil ear fullness).        Nasal congestion   Respiratory:  Positive for cough and sputum production (with chest congestion).   Cardiovascular:  Negative for chest pain.  All other systems reviewed and are negative.     Objective:    Physical Exam Constitutional:      General: She is not in acute distress.    Appearance: Normal appearance. She is not ill-appearing.  Pulmonary:     Effort: Pulmonary effort is normal.  Neurological:     General: No focal deficit present.     Mental Status: She is alert and oriented to person, place, and time.  Psychiatric:        Mood and Affect: Mood normal.        Behavior: Behavior normal.        Thought Content: Thought content normal.    Temp 99.3 F (37.4 C)    Ht '5\' 6"'$  (1.676 m)    Wt 170 lb (77.1 kg)    LMP 01/06/1993    BMI 27.44 kg/m  Wt Readings from Last 3 Encounters:  03/14/21 170 lb (77.1 kg)  02/28/21 173 lb 3.2 oz (78.6 kg)  01/22/21 178 lb 3.2 oz (80.8 kg)       Assessment & Plan:   Problem List Items Addressed This Visit       Respiratory   Upper respiratory infection, acute - Primary    Take antibiotic as prescribed. Increase oral fluids. Pt to f/u if sx worsen and or fail to improve in 2-3  days. If severe /moderate start of sob go to er/urgent care over weekend. increase oral fluids      Relevant Medications   amoxicillin-clavulanate (AUGMENTIN) 875-125 MG tablet     Other   Contact with and (suspected) exposure to covid-19    covid test ordered pending results      Relevant Orders   POC COVID-19 BinaxNow   Sore throat    Testing for strep pending results      Relevant Orders   POC COVID-19 BinaxNow   POCT rapid strep A    I am having [name] B. Clock start on amoxicillin-clavulanate. I am also  having her maintain her aspirin, Vitamin D3, Magnesium Malate, desonide, Calcium Carbonate-Vitamin D (CALCIUM 500/D PO), ibuprofen, zoledronic acid, Omega-3, Multi-Vitamins, acetaminophen, budesonide, albuterol, diclofenac Sodium, cycloSPORINE, fluticasone, Ascorbic Acid (VITAMIN C PO), Zinc, Estradiol, Systane Balance, fluocinonide, triamcinolone cream, Orencia, azaTHIOprine, Omeprazole-Sodium Bicarbonate, and zolpidem.  Meds ordered this encounter  Medications   amoxicillin-clavulanate (AUGMENTIN) 875-125 MG tablet    Sig: Take 1 tablet by mouth 2 (two) times daily for 10 days.    Dispense:  20 tablet    Refill:  0    Order Specific Question:   Supervising Provider    Answer:   BEDSOLE, AMY E [[id number]]    I discussed the assessment and treatment plan with the patient. The patient was provided an opportunity to ask questions and all were answered. The patient agreed with the plan and demonstrated an understanding of the instructions.   The patient was advised to call back or seek an in-person evaluation if the symptoms worsen or if the condition fails to improve as anticipated.  I provided 18 minutes of face-to-face time during this encounter.   Eugenia Pancoast, [place] [facility] [place] [phone] (phone) [phone] (fax)  [place]

## 2021-03-14 NOTE — Telephone Encounter (Signed)
VOB submitted for Euflexxa, bilateral knees BV pending 

## 2021-03-14 NOTE — Assessment & Plan Note (Signed)
Take antibiotic as prescribed. Increase oral fluids. Pt to f/u if sx worsen and or fail to improve in 2-3 days. ?If severe /moderate start of sob go to er/urgent care over weekend. ?increase oral fluids ?

## 2021-03-14 NOTE — Assessment & Plan Note (Signed)
Testing for strep pending results ?

## 2021-03-17 ENCOUNTER — Ambulatory Visit
Admission: EM | Admit: 2021-03-17 | Discharge: 2021-03-17 | Disposition: A | Discharge status: Home / Self Care | Payer: Medicare Other | Attending: Internal Medicine | Admitting: Internal Medicine

## 2021-03-17 ENCOUNTER — Ambulatory Visit (INDEPENDENT_AMBULATORY_CARE_PROVIDER_SITE_OTHER): Payer: Medicare Other

## 2021-03-17 ENCOUNTER — Encounter: Payer: Self-pay | Admitting: Emergency Medicine

## 2021-03-17 DIAGNOSIS — R052 Subacute cough: Secondary | ICD-10-CM | POA: Diagnosis not present

## 2021-03-17 DIAGNOSIS — R059 Cough, unspecified: Secondary | ICD-10-CM

## 2021-03-17 DIAGNOSIS — R053 Chronic cough: Secondary | ICD-10-CM

## 2021-03-17 DIAGNOSIS — R918 Other nonspecific abnormal finding of lung field: Secondary | ICD-10-CM | POA: Diagnosis not present

## 2021-03-17 MED ORDER — ALBUTEROL SULFATE HFA 108 (90 BASE) MCG/ACT IN AERS
1.0000 | INHALATION_SPRAY | Freq: Once | RESPIRATORY_TRACT | Status: AC
  Administered 2021-03-17: 1 via RESPIRATORY_TRACT

## 2021-03-17 MED ORDER — BENZONATATE 100 MG PO CAPS: 100.0000 mg | ORAL_CAPSULE | Freq: Three times a day (TID) | ORAL | 0 refills | Status: DC | PRN

## 2021-03-17 MED ORDER — PREDNISONE 20 MG PO TABS: 40.0000 mg | ORAL_TABLET | Freq: Every day | ORAL | 0 refills | Status: AC

## 2021-03-17 NOTE — ED Triage Notes (Signed)
Symptoms began 1.5 weeks ago. Cough, URI symptoms. Was prescribed augmentin, feels better since taking it. "Feels irritated in my chest." The inhaler she's been using is expired.  ? ?Hx of asthma, interstitial lung disease. ?

## 2021-03-17 NOTE — ED Provider Notes (Signed)
Deanna Norris    CSN: [id number] Arrival date & time: 03/17/21  0840      History   Chief Complaint No chief complaint on file.   HPI Deanna Norris is a 76 y.o. female.   Patient presents with 1.5-week history of cough and nasal congestion.  Patient was seen by PCP on 03/14/2021 with a video visit and was prescribed Augmentin antibiotic.  She had a negative COVID and strep test at that time.  She reports that she has has some improvement with antibiotic but that she feels like it is moving down to her chest.  She does have history of interstitial lung disease and asthma.  She has been using albuterol inhaler but reports that it has been expired so is not providing much use.  She has also been taking Delsym with minimal improvement.  Denies any shortness of breath or fever.  Denies sore throat, ear pain, nausea, vomiting, diarrhea, abdominal pain.    Past Medical History:  Diagnosis Date   Asthma    Constipation    Fibroid    GERD (gastroesophageal reflux disease)    Insomnia    Interstitial lung disease (HCC)    Lumbar vertebral fracture (HCC)    L5-S1   Menopausal symptoms    Osteoporosis    Postmenopausal HRT (hormone replacement therapy)    RA (rheumatoid arthritis) (HCC) 11/2004   methotrexate and Embrel   Rosacea    Sjogren's disease (HCC)    Sleep apnea 2008   C-Pap   Vitamin D deficiency     Patient Active Problem List   Diagnosis Date Noted   Contact with and (suspected) exposure to covid-19 03/14/2021   Sore throat 03/14/2021   Upper respiratory infection, acute 03/14/2021   Tinnitus 11/09/2020   Preoperative respiratory examination 06/07/2020   ILD (interstitial lung disease) (HCC)    Hearing loss 07/10/2019   Allergic rhinitis 04/08/2018   Immunocompromised state (HCC) 02/17/2018   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    Primary osteoarthritis of both hands 01/11/2016   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/11/2016   High risk medication use 11/15/2015   Sjogren's syndrome (HCC) 10/31/2015   Chronic constipation 10/23/2015   Rosacea 10/23/2015   Rheumatoid arthritis (HCC) 03/29/2015   Coronary artery calcification seen on CT scan 11/17/2013   Recurrent sinusitis 09/16/2013   Encounter for Medicare annual wellness exam 01/31/2013   Dyspnea on exertion 09/14/2012   Nodule of right lung 09/14/2012   Hyperlipidemia 05/08/2009   Tear film insufficiency 05/08/2009   Asthma 05/08/2009   GERD 05/08/2009   MENOPAUSAL DISORDER 05/08/2009   ARTHRITIS, RHEUMATOID 05/08/2009   Osteoporosis 05/08/2009   SPONDYLOLISTHESIS 05/08/2009   Sleep apnea 05/08/2009    Past Surgical History:  Procedure Laterality Date   ABDOMINAL HYSTERECTOMY  01/1993   partial with AP repair, secondary to fibroids   BREAST SURGERY  1991   breast biopsy   BRONCHIAL WASHINGS  06/07/2020   Procedure: BRONCHIAL WASHINGS;  Surgeon: Kalman Shan, MD;  Location: [facility];  Service: Endoscopy;;   CYSTOCELE REPAIR  10/2007   with graft   MELANOMA EXCISION Right 01/2012   excision right posterior arm   NASAL SINUS SURGERY  07/1991   VAGINAL DELIVERY     x3   VIDEO BRONCHOSCOPY N/A 06/07/2020   Procedure: VIDEO BRONCHOSCOPY WITHOUT FLUORO;  Surgeon: Kalman Shan, MD;  Location: [facility];  Service: Endoscopy;  Laterality: N/A;    OB History     Gravida  3   Para  3   Term  3   Preterm  0   AB  0   Living  3      SAB  0   IAB  0   Ectopic  0   Multiple  0   Live Births  3            Home Medications    Prior to Admission medications   Medication Sig Start Date End Date Taking? Authorizing Provider  benzonatate (TESSALON) 100 MG capsule Take 1 capsule (100 mg total) by mouth every 8 (eight) hours as needed for cough. 03/17/21  Yes Carinna Newhart, Rolly Salter E, FNP  predniSONE (DELTASONE) 20 MG tablet Take 2  tablets (40 mg total) by mouth daily for 5 days. 03/17/21 03/22/21 Yes Shaunak Kreis, Acie Fredrickson, FNP  acetaminophen (TYLENOL) 500 MG tablet Take 500-1,000 mg by mouth every 8 (eight) hours as needed for moderate pain.    [provider]  albuterol (PROAIR HFA) 108 (90 Base) MCG/ACT inhaler Inhale 2 puffs into the lungs every 4 (four) hours as needed for wheezing or shortness of breath. 01/06/19   Tower, Audrie Gallus, MD  amoxicillin-clavulanate (AUGMENTIN) 875-125 MG tablet Take 1 tablet by mouth 2 (two) times daily for 10 days. 03/14/21 03/24/21  Mort Sawyers, FNP  Ascorbic Acid (VITAMIN C PO) Take 500 mg by mouth daily.    [provider]  aspirin 81 MG tablet Take 81 mg by mouth daily.    [provider]  azaTHIOprine (IMURAN) 50 MG tablet Take 2 tablets (100 mg total) by mouth daily. Patient not taking: Reported on 03/14/2021 01/31/21   Gearldine Bienenstock, PA-C  budesonide (PULMICORT) 180 MCG/ACT inhaler Inhale 1 puff into the lungs daily as needed (asthma).    [provider]  Calcium Carbonate-Vitamin D (CALCIUM 500/D PO) Take 1 tablet by mouth 2 (two) times daily.    [provider]  Cholecalciferol (VITAMIN D3) 2000 UNITS TABS Take 2,000 Units by mouth daily.    [provider]  cycloSPORINE (RESTASIS) 0.05 % ophthalmic emulsion Place 1 drop into both eyes 2 (two) times daily.    [provider]  desonide (DESOWEN) 0.05 % lotion Apply 1 application topically daily as needed (irritation). 01/14/16   [provider]  diclofenac Sodium (VOLTAREN) 1 % GEL Apply 2-4 grams to affected joint 4 times daily as needed. Patient taking differently: Apply 2-4 g topically 4 (four) times daily as needed (pain). Apply 2-4 grams to affected joint 4 times daily as needed. 04/22/19   Gearldine Bienenstock, PA-C  Estradiol (VAGIFEM) 10 MCG TABS vaginal tablet Place 1 tablet (10 mcg total) vaginally 2 (two) times a week. Place one tablet (10 mcg) per vagina at bedtime twice a  week. 04/02/20   Patton Salles, MD  fluocinonide (LIDEX) 0.05 % external solution Apply topically. 09/03/20   [provider]  fluticasone (FLONASE) 50 MCG/ACT nasal spray USE 2 SPRAYS IN EACH NOSTRIL DAILY AS NEEDED FOR RHINITIS OR ALLERGIES Patient taking differently: Place 2 sprays into both nostrils daily as needed for allergies. USE 2 SPRAYS IN EACH NOSTRIL DAILY AS NEEDED FOR RHINITIS OR ALLERGIES 06/22/19   Tower, Audrie Gallus, MD  ibuprofen (ADVIL) 200 MG tablet Take 200-400 mg by mouth every 8 (eight) hours as needed (pain).    [provider]  Magnesium Malate 1250 (141.7 Mg) MG TABS  Take 3 tablets by mouth daily.     [provider]  Multiple Vitamin (MULTI-VITAMINS) TABS Take 1 tablet by mouth daily.    [provider]  Omega-3 1000 MG CAPS Take 1,000 mg by mouth daily.    [provider]  Omeprazole-Sodium Bicarbonate (ZEGERID) 20-1100 MG CAPS capsule  01/09/21   [provider]  ORENCIA 125 MG/ML SOSY INJECT THE CONTENTS OF 1 SYRINGE UNDER THE SKIN EVERY WEEK Patient not taking: Reported on 03/14/2021 01/03/21   Gearldine Bienenstock, PA-C  Propylene Glycol (SYSTANE BALANCE) 0.6 % SOLN Place 1 drop into both eyes daily as needed (dry eyes).    [provider]  triamcinolone cream (KENALOG) 0.1 % SMARTSIG:1 Application Topical 2-3 Times Daily 09/03/20   [provider]  Zinc 50 MG TABS Take 50 mg by mouth daily. 01/07/19   [provider]  zoledronic acid (RECLAST) 5 MG/100ML SOLN injection Inject 5 mg into the vein once. Takes yearly    [provider]  zolpidem (AMBIEN) 5 MG tablet Take 0.5 tablets (2.5 mg total) by mouth at bedtime as needed for sleep. 03/13/21   Pollyann Savoy, MD    Family History Family History  Problem Relation Age of Onset   Breast cancer Sister 32   Thyroid disease Sister    Cancer Sister 74       Leukemia--CLL   Heart disease Brother     Social History Social History    Tobacco Use   Smoking status: Never   Smokeless tobacco: Never  Vaping Use   Vaping Use: Never used  Substance Use Topics   Alcohol use: No    Alcohol/week: 0.0 standard drinks   Drug use: No     Allergies   Adalimumab, Diclofenac, Dicyclomine, Doxycycline hyclate, Enbrel [etanercept], Fosamax [alendronate sodium], Methotrexate derivatives, Nickel, Piroxicam, Salagen [pilocarpine hcl], Sulfamethoxazole-trimethoprim, Tramadol hcl, and Plaquenil [hydroxychloroquine]   Review of Systems Review of Systems Per HPI  Physical Exam Triage Vital Signs ED Triage Vitals  Enc Vitals Group     BP 03/17/21 0858 133/87     Pulse Rate 03/17/21 0858 67     Resp 03/17/21 0858 18     Temp 03/17/21 0858 98 F (36.7 C)     Temp Source 03/17/21 0858 Oral     SpO2 03/17/21 0858 91 %     Weight --      Height --      Head Circumference --      Peak Flow --      Pain Score 03/17/21 0857 0     Pain Loc --      Pain Edu? --      Excl. in GC? --    No data found.  Updated Vital Signs BP 133/87 (BP Location: Left Arm)   Pulse 67   Temp 98 F (36.7 C) (Oral)   Resp 18   LMP 01/06/1993   SpO2 97%   Visual Acuity Right Eye Distance:   Left Eye Distance:   Bilateral Distance:    Right Eye Near:   Left Eye Near:    Bilateral Near:     Physical Exam Constitutional:      General: She is not in acute distress.    Appearance: Normal appearance. She is not toxic-appearing or diaphoretic.  HENT:     Head: Normocephalic and atraumatic.     Right Ear: Tympanic membrane and ear canal normal.     Left Ear: Tympanic membrane and ear  canal normal.     Nose: Congestion present.     Mouth/Throat:     Mouth: Mucous membranes are moist.     Pharynx: No posterior oropharyngeal erythema.  Eyes:     Extraocular Movements: Extraocular movements intact.     Conjunctiva/sclera: Conjunctivae normal.     Pupils: Pupils are equal, round, and reactive to light.  Cardiovascular:     Rate and  Rhythm: Normal rate and regular rhythm.     Pulses: Normal pulses.     Heart sounds: Normal heart sounds.  Pulmonary:     Effort: Pulmonary effort is normal. No respiratory distress.     Breath sounds: Normal breath sounds. No stridor. No wheezing, rhonchi or rales.  Abdominal:     General: Abdomen is flat. Bowel sounds are normal.     Palpations: Abdomen is soft.  Musculoskeletal:        General: Normal range of motion.     Cervical back: Normal range of motion.  Skin:    General: Skin is warm and dry.  Neurological:     General: No focal deficit present.     Mental Status: She is alert and oriented to person, place, and time. Mental status is at baseline.  Psychiatric:        Mood and Affect: Mood normal.        Behavior: Behavior normal.     UC Treatments / Results  Labs (all labs ordered are listed, but only abnormal results are displayed) Labs Reviewed - No data to display  EKG   Radiology DG Chest 2 View  Result Date: 03/17/2021 CLINICAL DATA:  Cough. EXAM: CHEST - 2 VIEW COMPARISON:  01/18/2021 chest CT and prior studies FINDINGS: The cardiomediastinal silhouette is unremarkable. Mild interstitial opacities are again noted. There is no evidence of focal airspace disease, pulmonary edema, suspicious pulmonary nodule/mass, pleural effusion, or pneumothorax. No acute bony abnormalities are identified. IMPRESSION: 1. No evidence of acute cardiopulmonary disease. 2. Mild chronic interstitial opacities. Electronically Signed   By: Harmon Pier M.D.   On: 03/17/2021 09:21    Procedures Procedures (including critical Norris time)  Medications Ordered in UC Medications  albuterol (VENTOLIN HFA) 108 (90 Base) MCG/ACT inhaler 1 puff (1 puff Inhalation Given 03/17/21 0954)    Initial Impression / Assessment and Plan / UC Course  I have reviewed the triage vital signs and the nursing notes.  Pertinent labs & imaging results that were available during my Norris of the patient were  reviewed by me and considered in my medical decision making (see chart for details).     Chest x-ray was negative for any acute cardiopulmonary process.  It did show chronic changes that were stable.  Will prescribe prednisone steroid to decrease inflammation in chest as I feel that this will be helpful for patient.  Patient reports that she has taken this before and has tolerated well.  Benzonatate prescribed to take as needed for cough.  Do not think that any additional antibiotics are needed at this time.  Oxygen is stable and no signs of respiratory compromise.  Discussed strict return and ER precautions.  Patient verbalized understanding and was agreeable with plan. Final Clinical Impressions(s) / UC Diagnoses   Final diagnoses:  Persistent cough     Discharge Instructions      Your chest x-ray was normal.  You have been prescribed 2 medications to alleviate symptoms.  Please follow-up if symptoms persist or worsen.     ED Prescriptions  Medication Sig Dispense Auth. Provider   predniSONE (DELTASONE) 20 MG tablet Take 2 tablets (40 mg total) by mouth daily for 5 days. 10 tablet [place], Oyens E, [place]   benzonatate (TESSALON) 100 MG capsule Take 1 capsule (100 mg total) by mouth every 8 (eight) hours as needed for cough. 21 capsule [place], Acie Fredrickson, [place]      PDMP not reviewed this encounter.   Gustavus Bryant, [place] 03/17/21 [phone]

## 2021-03-17 NOTE — Discharge Instructions (Signed)
Your chest x-ray was normal.  You have been prescribed 2 medications to alleviate symptoms.  Please follow-up if symptoms persist or worsen. ?

## 2021-03-25 ENCOUNTER — Ambulatory Visit (INDEPENDENT_AMBULATORY_CARE_PROVIDER_SITE_OTHER): Payer: Medicare Other | Admitting: Family Medicine

## 2021-03-25 VITALS — Ht 66.5 in | Wt 173.0 lb

## 2021-03-25 DIAGNOSIS — Z0000 Encounter for general adult medical examination without abnormal findings: Secondary | ICD-10-CM

## 2021-03-25 NOTE — Telephone Encounter (Signed)
?  Approved for Euflexxa series, bilateral knees ?[employer] [place] ?Deductible has been met for Medicare ?Secondary plan, [employer] will cover 20% medicare coinsurance ?No Co-pay ?No PA required ? ?

## 2021-03-25 NOTE — Patient Instructions (Signed)
Deanna Norris , ?Thank you for taking time to come for your Medicare Wellness Visit. I appreciate your ongoing commitment to your health goals. Please review the following plan we discussed and let me know if I can assist you in the future.  ? ?Screening recommendations/referrals: ?Colonoscopy: Done 01/12/2013 Repeat in 10 years ? ?Mammogram: Done 02/28/2021 Repeat annually ? ?Bone Density: Done 02/27/2021 Repeat every 2 years ? ?Recommended yearly ophthalmology/optometry visit for glaucoma screening and checkup ?Recommended yearly dental visit for hygiene and checkup ? ?Vaccinations: ?Influenza vaccine: Done 11/02/2020 Repeat annually ? ?Pneumococcal vaccine: Done 02/13/2014 and 03/28/2015 ?Tdap vaccine: Done 03/23/2013 Repeat in 10 years ? ?Shingles vaccine: Discussed.    ?Covid-19:Done 06/02/2019 and 08/26/2019. ? ?Advanced directives: Please bring a copy of your health care power of attorney and living will to the office to be added to your chart at your convenience. ? ? ?Conditions/risks identified: KEEP UP THE GOOD WORK!! ? ?Next appointment: Follow up in one year for your annual wellness visit 2024. ? ? ?Preventive Care 42 Years and Older, Female ?Preventive care refers to lifestyle choices and visits with your health care provider that can promote health and wellness. ?What does preventive care include? ?A yearly physical exam. This is also called an annual well check. ?Dental exams once or twice a year. ?Routine eye exams. Ask your health care provider how often you should have your eyes checked. ?Personal lifestyle choices, including: ?Daily care of your teeth and gums. ?Regular physical activity. ?Eating a healthy diet. ?Avoiding tobacco and drug use. ?Limiting alcohol use. ?Practicing safe sex. ?Taking low-dose aspirin every day. ?Taking vitamin and mineral supplements as recommended by your health care provider. ?What happens during an annual well check? ?The services and screenings done by your health care  provider during your annual well check will depend on your age, overall health, lifestyle risk factors, and family history of disease. ?Counseling  ?Your health care provider may ask you questions about your: ?Alcohol use. ?Tobacco use. ?Drug use. ?Emotional well-being. ?Home and relationship well-being. ?Sexual activity. ?Eating habits. ?History of falls. ?Memory and ability to understand (cognition). ?Work and work Statistician. ?Reproductive health. ?Screening  ?You may have the following tests or measurements: ?Height, weight, and BMI. ?Blood pressure. ?Lipid and cholesterol levels. These may be checked every 5 years, or more frequently if you are over 2 years old. ?Skin check. ?Lung cancer screening. You may have this screening every year starting at age 68 if you have a 30-pack-year history of smoking and currently smoke or have quit within the past 15 years. ?Fecal occult blood test (FOBT) of the stool. You may have this test every year starting at age 50. ?Flexible sigmoidoscopy or colonoscopy. You may have a sigmoidoscopy every 5 years or a colonoscopy every 10 years starting at age 20. ?Hepatitis C blood test. ?Hepatitis B blood test. ?Sexually transmitted disease (STD) testing. ?Diabetes screening. This is done by checking your blood sugar (glucose) after you have not eaten for a while (fasting). You may have this done every 1-3 years. ?Bone density scan. This is done to screen for osteoporosis. You may have this done starting at age 36. ?Mammogram. This may be done every 1-2 years. Talk to your health care provider about how often you should have regular mammograms. ?Talk with your health care provider about your test results, treatment options, and if necessary, the need for more tests. ?Vaccines  ?Your health care provider may recommend certain vaccines, such as: ?Influenza vaccine. This is recommended  every year. ?Tetanus, diphtheria, and acellular pertussis (Tdap, Td) vaccine. You may need a Td  booster every 10 years. ?Zoster vaccine. You may need this after age 51. ?Pneumococcal 13-valent conjugate (PCV13) vaccine. One dose is recommended after age 34. ?Pneumococcal polysaccharide (PPSV23) vaccine. One dose is recommended after age 38. ?Talk to your health care provider about which screenings and vaccines you need and how often you need them. ?This information is not intended to replace advice given to you by your health care provider. Make sure you discuss any questions you have with your health care provider. ?Document Released: 01/19/2015 Document Revised: 09/12/2015 Document Reviewed: 10/24/2014 ?Elsevier Interactive Patient Education ? 2017 [place]. ? ?Fall Prevention in the Home ?Falls can cause injuries. They can happen to people of all ages. There are many things you can do to make your home safe and to help prevent falls. ?What can I do on the outside of my home? ?Regularly fix the edges of walkways and driveways and fix any cracks. ?Remove anything that might make you trip as you walk through a door, such as a raised step or threshold. ?Trim any bushes or trees on the path to your home. ?Use bright outdoor lighting. ?Clear any walking paths of anything that might make someone trip, such as rocks or tools. ?Regularly check to see if handrails are loose or broken. Make sure that both sides of any steps have handrails. ?Any raised decks and porches should have guardrails on the edges. ?Have any leaves, snow, or ice cleared regularly. ?Use sand or salt on walking paths during winter. ?Clean up any spills in your garage right away. This includes oil or grease spills. ?What can I do in the bathroom? ?Use night lights. ?Install grab bars by the toilet and in the tub and shower. Do not use towel bars as grab bars. ?Use non-skid mats or decals in the tub or shower. ?If you need to sit down in the shower, use a plastic, non-slip stool. ?Keep the floor dry. Clean up any water that spills on the floor  as soon as it happens. ?Remove soap buildup in the tub or shower regularly. ?Attach bath mats securely with double-sided non-slip rug tape. ?Do not have throw rugs and other things on the floor that can make you trip. ?What can I do in the bedroom? ?Use night lights. ?Make sure that you have a light by your bed that is easy to reach. ?Do not use any sheets or blankets that are too big for your bed. They should not hang down onto the floor. ?Have a firm chair that has side arms. You can use this for support while you get dressed. ?Do not have throw rugs and other things on the floor that can make you trip. ?What can I do in the kitchen? ?Clean up any spills right away. ?Avoid walking on wet floors. ?Keep items that you use a lot in easy-to-reach places. ?If you need to reach something above you, use a strong step stool that has a grab bar. ?Keep electrical cords out of the way. ?Do not use floor polish or wax that makes floors slippery. If you must use wax, use non-skid floor wax. ?Do not have throw rugs and other things on the floor that can make you trip. ?What can I do with my stairs? ?Do not leave any items on the stairs. ?Make sure that there are handrails on both sides of the stairs and use them. Fix handrails that are broken  or loose. Make sure that handrails are as long as the stairways. ?Check any carpeting to make sure that it is firmly attached to the stairs. Fix any carpet that is loose or worn. ?Avoid having throw rugs at the top or bottom of the stairs. If you do have throw rugs, attach them to the floor with carpet tape. ?Make sure that you have a light switch at the top of the stairs and the bottom of the stairs. If you do not have them, ask someone to add them for you. ?What else can I do to help prevent falls? ?Wear shoes that: ?Do not have high heels. ?Have rubber bottoms. ?Are comfortable and fit you well. ?Are closed at the toe. Do not wear sandals. ?If you use a stepladder: ?Make sure that it is  fully opened. Do not climb a closed stepladder. ?Make sure that both sides of the stepladder are locked into place. ?Ask someone to hold it for you, if possible. ?Clearly mark and make sure that you can see: ?An

## 2021-03-25 NOTE — Progress Notes (Signed)
? ?Subjective:  ? [name] is a 76 y.o. female who presents for Medicare Annual (Subsequent) preventive examination. ? ?Virtual Visit via Telephone Note ? ?I connected with  [name] on 03/25/21 at  9:00 AM EDT by telephone and verified that I am speaking with the correct person using two identifiers. ? ?Location: ?Patient: HOME ?Provider: [facility] ?Persons participating in the virtual visit: patient/Nurse Health Advisor ?  ?I discussed the limitations, risks, security and privacy concerns of performing an evaluation and management service by telephone and the availability of in person appointments. The patient expressed understanding and agreed to proceed. ? ?Interactive audio and video telecommunications were attempted between this nurse and patient, however failed, due to patient having technical difficulties OR patient did not have access to video capability.  We continued and completed visit with audio only. ? ?Some vital signs may be absent or patient reported.  ? ?Chriss Driver, LPN ? ?Review of Systems    ? ?Cardiac Risk Factors include: advanced age (>101mn, >>107women);dyslipidemia;sedentary lifestyle;Other (see comment), Risk factor comments: RA, SJOGRENS SYNDROME, LUNG DZ. ? ?   ?Objective:  ?  ?Today's Vitals  ? 03/25/21 0900 03/25/21 0906  ?Weight: 173 lb (78.5 kg)   ?Height: 5' 6.5" (1.689 m)   ?PainSc:  0-No pain  ? ?Body mass index is 27.5 kg/m?. ? ?Advanced Directives 03/25/2021 06/07/2020 06/16/2019 04/08/2018 02/15/2018 04/01/2017 03/31/2016  ?Does Patient Have a Medical Advance Directive? Yes Yes Yes Yes Yes Yes Yes  ?Type of AParamedicof AD[place]Living will HBokeeliaLiving will HDurbinLiving will HPort Washington NorthLiving will HQuonochontaugLiving will;Out of facility DNR (pink MOST or yellow form) HNorth [place]Living will HVarnaLiving will   ?Does patient want to make changes to medical advance directive? - - - - - - -  ?Copy of [place] Lakein Chart? No - copy requested No - copy requested No - copy requested No - copy requested No - copy requested No - copy requested No - copy requested  ? ? ?Current Medications (verified) ?Outpatient Encounter Medications as of 03/25/2021  ?Medication Sig  ? acetaminophen (TYLENOL) 500 MG tablet Take 500-1,000 mg by mouth every 8 (eight) hours as needed for moderate pain.  ? albuterol (PROAIR HFA) 108 (90 Base) MCG/ACT inhaler Inhale 2 puffs into the lungs every 4 (four) hours as needed for wheezing or shortness of breath.  ? Ascorbic Acid (VITAMIN C PO) Take 500 mg by mouth daily.  ? aspirin 81 MG tablet Take 81 mg by mouth daily.  ? azaTHIOprine (IMURAN) 50 MG tablet Take 2 tablets (100 mg total) by mouth daily.  ? budesonide (PULMICORT) 180 MCG/ACT inhaler Inhale 1 puff into the lungs daily as needed (asthma).  ? Calcium Carbonate-Vitamin D (CALCIUM 500/D PO) Take 1 tablet by mouth 2 (two) times daily.  ? Cholecalciferol (VITAMIN D3) 2000 UNITS TABS Take 2,000 Units by mouth daily.  ? cycloSPORINE (RESTASIS) 0.05 % ophthalmic emulsion Place 1 drop into both eyes 2 (two) times daily.  ? desonide (DESOWEN) 0.05 % lotion Apply 1 application topically daily as needed (irritation).  ? diclofenac Sodium (VOLTAREN) 1 % GEL Apply 2-4 grams to affected joint 4 times daily as needed. (Patient taking differently: Apply 2-4 g topically 4 (four) times daily as needed (pain). Apply 2-4 grams to affected joint 4 times daily as needed.)  ? Estradiol (VAGIFEM) 10 MCG TABS  vaginal tablet Place 1 tablet (10 mcg total) vaginally 2 (two) times a week. Place one tablet (10 mcg) per vagina at bedtime twice a week.  ? fluocinonide (LIDEX) 0.05 % external solution Apply topically.  ? fluticasone (FLONASE) 50 MCG/ACT nasal spray USE 2 SPRAYS IN EACH NOSTRIL DAILY AS NEEDED FOR RHINITIS OR ALLERGIES (Patient taking  differently: Place 2 sprays into both nostrils daily as needed for allergies. USE 2 SPRAYS IN EACH NOSTRIL DAILY AS NEEDED FOR RHINITIS OR ALLERGIES)  ? ibuprofen (ADVIL) 200 MG tablet Take 200-400 mg by mouth every 8 (eight) hours as needed (pain).  ? Magnesium Malate 1250 (141.7 Mg) MG TABS Take 3 tablets by mouth daily.   ? Multiple Vitamin (MULTI-VITAMINS) TABS Take 1 tablet by mouth daily.  ? Omega-3 1000 MG CAPS Take 1,000 mg by mouth daily.  ? Omeprazole-Sodium Bicarbonate (ZEGERID) 20-1100 MG CAPS capsule   ? ORENCIA 125 MG/ML SOSY INJECT THE CONTENTS OF 1 SYRINGE UNDER THE SKIN EVERY WEEK  ? Propylene Glycol (SYSTANE BALANCE) 0.6 % SOLN Place 1 drop into both eyes daily as needed (dry eyes).  ? triamcinolone cream (KENALOG) 0.1 % SMARTSIG:1 Application Topical 2-3 Times Daily  ? Zinc 50 MG TABS Take 50 mg by mouth daily.  ? zoledronic acid (RECLAST) 5 MG/100ML SOLN injection Inject 5 mg into the vein once. Takes yearly  ? zolpidem (AMBIEN) 5 MG tablet Take 0.5 tablets (2.5 mg total) by mouth at bedtime as needed for sleep.  ? [EXPIRED] amoxicillin-clavulanate (AUGMENTIN) 875-125 MG tablet Take 1 tablet by mouth 2 (two) times daily for 10 days.  ? [EXPIRED] predniSONE (DELTASONE) 20 MG tablet Take 2 tablets (40 mg total) by mouth daily for 5 days.  ? [DISCONTINUED] benzonatate (TESSALON) 100 MG capsule Take 1 capsule (100 mg total) by mouth every 8 (eight) hours as needed for cough.  ? ?No facility-administered encounter medications on file as of 03/25/2021.  ? ? ?Allergies (verified) ?Adalimumab, Diclofenac, Dicyclomine, Doxycycline hyclate, Enbrel [etanercept], Fosamax [alendronate sodium], Methotrexate derivatives, Nickel, Piroxicam, Salagen [pilocarpine hcl], Sulfamethoxazole-trimethoprim, Tramadol hcl, and Plaquenil [hydroxychloroquine]  ? ?History: ?Past Medical History:  ?Diagnosis Date  ? Asthma   ? Constipation   ? Fibroid   ? GERD (gastroesophageal reflux disease)   ? Insomnia   ? Interstitial lung  disease ([place])   ? Lumbar vertebral fracture ([facility])   ? L5-S1  ? Menopausal symptoms   ? Osteoporosis   ? Postmenopausal HRT (hormone replacement therapy)   ? RA (rheumatoid arthritis) ([place]) 11/2004  ? methotrexate and Embrel  ? Rosacea   ? Sjogren's disease ([place])   ? Sleep apnea 2008  ? C-Pap  ? Vitamin D deficiency   ? ?Past Surgical History:  ?Procedure Laterality Date  ? ABDOMINAL HYSTERECTOMY  01/1993  ? partial with AP repair, secondary to fibroids  ? BREAST SURGERY  1991  ? breast biopsy  ? BRONCHIAL WASHINGS  06/07/2020  ? Procedure: BRONCHIAL WASHINGS;  Surgeon: Brand Males, MD;  Location: [facility];  Service: Endoscopy;;  ? CYSTOCELE REPAIR  10/2007  ? with graft  ? MELANOMA EXCISION Right 01/2012  ? excision right posterior arm  ? NASAL SINUS SURGERY  07/1991  ? VAGINAL DELIVERY    ? x3  ? VIDEO BRONCHOSCOPY N/A 06/07/2020  ? Procedure: VIDEO BRONCHOSCOPY WITHOUT FLUORO;  Surgeon: Brand Males, MD;  Location: [facility];  Service: Endoscopy;  Laterality: N/A;  ? ?Family History  ?Problem Relation Age of Onset  ? Breast cancer Sister 15  ?  Thyroid disease Sister   ? Cancer Sister 71  ?     Leukemia--CLL  ? Heart disease Brother   ? ?Social History  ? ?Socioeconomic History  ? Marital status: Married  ?  Spouse name: Not on file  ? Number of children: Not on file  ? Years of education: Not on file  ? Highest education level: Not on file  ?Occupational History  ? Occupation: housewife  ?  Employer: Not Employed  ?Tobacco Use  ? Smoking status: Never  ? Smokeless tobacco: Never  ?Vaping Use  ? Vaping Use: Never used  ?Substance and Sexual Activity  ? Alcohol use: No  ?  Alcohol/week: 0.0 standard drinks  ? Drug use: No  ? Sexual activity: Yes  ?  Partners: Male  ?  Birth control/protection: Surgical  ?  Comment: TAH  ?Other Topics Concern  ? Not on file  ?Social History Narrative  ? Not on file  ? ?Social Determinants of Health  ? ?Financial Resource Strain: Low Risk   ? Difficulty of Paying Living  Expenses: Not hard at all  ?Food Insecurity: No Food Insecurity  ? Worried About Charity fundraiser in the Last Year: Never true  ? Ran Out of Food in the Last Year: Never true  ?Transportation Needs: No Transp

## 2021-03-26 NOTE — Telephone Encounter (Signed)
Please call to schedule visco injections. ? ?Approved for Euflexxa, bilateral knees ?[place] ?Covered at 100% of the allowable amount ?Deductible does not apply.  ?No Co-pay ?No pre-certifications ?

## 2021-04-02 NOTE — Progress Notes (Signed)
76 y.o. [id number] Married Caucasian female here for annual breast and pelvic exam.   ? ?We prescribe Vagifem for vaginal atrophy and follow her osteoporosis. ? ?Patient treated with antibiotics for bronchitis 1 month ago and has treated herself with OTC yeast medication twice. She still feels vaginal irritation. ?No odor today.  ? ?No urinary incontinence or difficulty voiding.  ? ?Some abdominal bloating.  ?Has constipation.  ?Magnesium helps this.  ? ?PCP: Loura Pardon, MD   ? ?Patient's last menstrual period was 01/06/1993.     ?  ?    ?Sexually active: No.  ?The current method of family planning is status post hysterectomy.    ?Exercising: Yes.     Walking when able ?Smoker:  no ? ?Health Maintenance: ?Pap:  10-18-14 Neg  ?History of abnormal Pap:  no ?MMG:  02-26-21 Neg/BiRads1 ?Colonoscopy:  01-12-13 normal;next 01/2023 ?BMD:   02-27-21  Result :Osteoporosis--recently stopped Reclast.  Dr. Bennie Dallas following.  ?TDaP:  03-13-13 ?Gardasil:   no ?HIV:no ?Hep C:no ?Screening Labs:  PCP ? ? reports that she has never smoked. She has never used smokeless tobacco. She reports that she does not drink alcohol and does not use drugs. ? ?Past Medical History:  ?Diagnosis Date  ? Asthma   ? Constipation   ? Fibroid   ? GERD (gastroesophageal reflux disease)   ? Insomnia   ? Interstitial lung disease ([place])   ? Lumbar vertebral fracture ([facility])   ? L5-S1  ? Menopausal symptoms   ? Osteoporosis   ? Postmenopausal HRT (hormone replacement therapy)   ? RA (rheumatoid arthritis) ([place]) 11/2004  ? methotrexate and Embrel  ? Rosacea   ? Sjogren's disease ([place])   ? Sleep apnea 2008  ? C-Pap  ? Vitamin D deficiency   ? ? ?Past Surgical History:  ?Procedure Laterality Date  ? ABDOMINAL HYSTERECTOMY  01/1993  ? partial with AP repair, secondary to fibroids  ? BREAST SURGERY  1991  ? breast biopsy  ? BRONCHIAL WASHINGS  06/07/2020  ? Procedure: BRONCHIAL WASHINGS;  Surgeon: Brand Males, MD;  [facility]: [facility];  Service: Endoscopy;;  ?  CYSTOCELE REPAIR  10/2007  ? with graft  ? MELANOMA EXCISION Right 01/2012  ? excision right posterior arm  ? NASAL SINUS SURGERY  07/1991  ? VAGINAL DELIVERY    ? x3  ? VIDEO BRONCHOSCOPY N/A 06/07/2020  ? Procedure: VIDEO BRONCHOSCOPY WITHOUT FLUORO;  Surgeon: Brand Males, MD;  Location: [facility];  Service: Endoscopy;  Laterality: N/A;  ? ? ?Current Outpatient Medications  ?Medication Sig Dispense Refill  ? acetaminophen (TYLENOL) 500 MG tablet Take 500-1,000 mg by mouth every 8 (eight) hours as needed for moderate pain.    ? albuterol (PROAIR HFA) 108 (90 Base) MCG/ACT inhaler Inhale 2 puffs into the lungs every 4 (four) hours as needed for wheezing or shortness of breath. 18 g 3  ? Ascorbic Acid (VITAMIN C PO) Take 500 mg by mouth daily.    ? aspirin 81 MG tablet Take 81 mg by mouth daily.    ? azaTHIOprine (IMURAN) 50 MG tablet Take 2 tablets (100 mg total) by mouth daily. 180 tablet 0  ? budesonide (PULMICORT) 180 MCG/ACT inhaler Inhale 1 puff into the lungs daily as needed (asthma).    ? Calcium Carbonate-Vitamin D (CALCIUM 500/D PO) Take 1 tablet by mouth 2 (two) times daily.    ? Cholecalciferol (VITAMIN D3) 2000 UNITS TABS Take 2,000 Units by mouth daily.    ? cycloSPORINE (  RESTASIS) 0.05 % ophthalmic emulsion Place 1 drop into both eyes 2 (two) times daily.    ? desonide (DESOWEN) 0.05 % lotion Apply 1 application topically daily as needed (irritation).    ? diclofenac Sodium (VOLTAREN) 1 % GEL Apply 2-4 grams to affected joint 4 times daily as needed. (Patient taking differently: Apply 2-4 g topically 4 (four) times daily as needed (pain). Apply 2-4 grams to affected joint 4 times daily as needed.) 400 g 2  ? Estradiol (VAGIFEM) 10 MCG TABS vaginal tablet Place 1 tablet (10 mcg total) vaginally 2 (two) times a week. Place one tablet (10 mcg) per vagina at bedtime twice a week. 24 tablet 3  ? fluticasone (FLONASE) 50 MCG/ACT nasal spray USE 2 SPRAYS IN EACH NOSTRIL DAILY AS NEEDED FOR RHINITIS OR  ALLERGIES (Patient taking differently: Place 2 sprays into both nostrils daily as needed for allergies. USE 2 SPRAYS IN EACH NOSTRIL DAILY AS NEEDED FOR RHINITIS OR ALLERGIES) 48 g 3  ? ibuprofen (ADVIL) 200 MG tablet Take 200-400 mg by mouth every 8 (eight) hours as needed (pain).    ? Magnesium Malate 1250 (141.7 Mg) MG TABS Take 3 tablets by mouth daily.     ? Multiple Vitamin (MULTI-VITAMINS) TABS Take 1 tablet by mouth daily.    ? Omega-3 1000 MG CAPS Take 1,000 mg by mouth daily.    ? Omeprazole-Sodium Bicarbonate (ZEGERID) 20-1100 MG CAPS capsule     ? ORENCIA 125 MG/ML SOSY INJECT THE CONTENTS OF 1 SYRINGE UNDER THE SKIN EVERY WEEK 4 mL 2  ? Propylene Glycol (SYSTANE BALANCE) 0.6 % SOLN Place 1 drop into both eyes daily as needed (dry eyes).    ? Zinc 50 MG TABS Take 50 mg by mouth daily.    ? zolpidem (AMBIEN) 5 MG tablet Take 0.5 tablets (2.5 mg total) by mouth at bedtime as needed for sleep. 15 tablet 0  ? ?No current facility-administered medications for this visit.  ? ? ?Family History  ?Problem Relation Age of Onset  ? Breast cancer Sister 48  ? Thyroid disease Sister   ? Cancer Sister 65  ?     Leukemia--CLL  ? Heart disease Brother   ? ? ?Review of Systems  ?Genitourinary:  Positive for vaginal discharge (vaginal irritation).  ?All other systems reviewed and are negative. ? ?Exam:   ?BP 132/76   Pulse (!) 54   Ht '5\' 6"'$  (1.676 m)   Wt 179 lb (81.2 kg)   LMP 01/06/1993   SpO2 99%   BMI 28.89 kg/m?     ?General appearance: alert, cooperative and appears stated age ?Head: normocephalic, without obvious abnormality, atraumatic ?Neck: no adenopathy, supple, symmetrical, trachea midline and thyroid normal to inspection and palpation ?Lungs: clear to auscultation bilaterally ?Breasts: normal appearance, no masses or tenderness, No nipple retraction or dimpling, No nipple discharge or bleeding, No axillary adenopathy ?Heart: regular rate and rhythm ?Abdomen: soft, non-tender; no masses, no  organomegaly ?Extremities: extremities normal, atraumatic, no cyanosis or edema ?Skin: skin color, texture, turgor normal. No rashes or lesions ?Lymph nodes: cervical, supraclavicular, and axillary nodes normal. ?Neurologic: grossly normal ? ?Pelvic: External genitalia:  no lesions ?             No abnormal inguinal nodes palpated. ?             Urethra:  normal appearing urethra with no masses, tenderness or lesions ?  Bartholins and Skenes: normal    ?             Vagina: normal appearing vagina with normal color and discharge, no lesions.  First to second degree cystocele. Mesh graft palpable.  No erosion noted.  ?             Cervix: absent ?             Pap taken: no ?Bimanual Exam:  Uterus:  absent ?             Adnexa: no mass, fullness, tenderness ?             Rectal exam: yes.  Confirms. ?             Anus:  normal sphincter tone, no lesions ? ?Chaperone was present for exam:  yes. ? ?Assessment:   ?Well woman visit with gynecologic exam. ?Status post TAH/A and P repair - fibroid, prolapse.  ?Abdominal bloating.  ?Vaginal atrophy.  ?Vaginal irritation.  ?Second degree cystocele. ?Osteoporosis.  Off Reclast.  Hx prior Actonel use.  ?GERD. ?Constipation. Chronic.  ?Rheumatoid arthritis.  Joint pain.  ?Sjogren's.  ?FH breast cancer - sister. Genetic testing negative. ? ?Plan: ?Mammogram screening discussed. ?Self breast awareness reviewed. ?Pap and HR HPV as above. ?Guidelines for Calcium, Vitamin D, regular exercise program including cardiovascular and weight bearing exercise. ?Vaginitis Nuswab sent.  ?Refill of Vagifem.  I discussed potential effect on breast cancer.  ?Return for pelvic ultrasound. ?Follow up annually and prn.  ? ?After visit summary provided.  ? ?25 min  total time was spent for this patient encounter, including preparation, face-to-face counseling with the patient, coordination of care, and documentation of the encounter. ? ? ? ? ? ? ?

## 2021-04-03 ENCOUNTER — Ambulatory Visit (INDEPENDENT_AMBULATORY_CARE_PROVIDER_SITE_OTHER): Payer: Medicare Other | Admitting: Obstetrics and Gynecology

## 2021-04-03 ENCOUNTER — Other Ambulatory Visit (HOSPITAL_COMMUNITY)
Admission: RE | Admit: 2021-04-03 | Discharge: 2021-04-03 | Disposition: A | Discharge status: Home / Self Care | Payer: Medicare Other | Source: Ambulatory Visit | Attending: Obstetrics and Gynecology | Admitting: Obstetrics and Gynecology

## 2021-04-03 ENCOUNTER — Encounter: Payer: Self-pay | Admitting: Obstetrics and Gynecology

## 2021-04-03 ENCOUNTER — Other Ambulatory Visit: Payer: Self-pay

## 2021-04-03 VITALS — BP 132/76 | HR 54 | Ht 66.0 in | Wt 179.0 lb

## 2021-04-03 DIAGNOSIS — R140 Abdominal distension (gaseous): Secondary | ICD-10-CM

## 2021-04-03 DIAGNOSIS — Z01419 Encounter for gynecological examination (general) (routine) without abnormal findings: Secondary | ICD-10-CM

## 2021-04-03 DIAGNOSIS — N898 Other specified noninflammatory disorders of vagina: Secondary | ICD-10-CM | POA: Diagnosis not present

## 2021-04-03 DIAGNOSIS — N952 Postmenopausal atrophic vaginitis: Secondary | ICD-10-CM | POA: Diagnosis not present

## 2021-04-03 MED ORDER — ESTRADIOL 10 MCG VA TABS: 1.0000 | ORAL_TABLET | VAGINAL | 3 refills | Status: DC

## 2021-04-03 NOTE — Patient Instructions (Signed)

## 2021-04-04 LAB — CERVICOVAGINAL ANCILLARY ONLY
Bacterial Vaginitis (gardnerella): NEGATIVE
Candida Glabrata: NEGATIVE
Candida Vaginitis: NEGATIVE
Comment: NEGATIVE
Comment: NEGATIVE
Comment: NEGATIVE
Comment: NEGATIVE
Trichomonas: NEGATIVE

## 2021-04-08 ENCOUNTER — Ambulatory Visit (INDEPENDENT_AMBULATORY_CARE_PROVIDER_SITE_OTHER): Payer: Medicare Other | Admitting: Physician Assistant

## 2021-04-08 DIAGNOSIS — Z79899 Other long term (current) drug therapy: Secondary | ICD-10-CM | POA: Diagnosis not present

## 2021-04-08 DIAGNOSIS — Z111 Encounter for screening for respiratory tuberculosis: Secondary | ICD-10-CM | POA: Diagnosis not present

## 2021-04-08 DIAGNOSIS — M170 Bilateral primary osteoarthritis of knee: Secondary | ICD-10-CM | POA: Diagnosis not present

## 2021-04-08 MED ORDER — LIDOCAINE HCL 1 % IJ SOLN
1.5000 mL | INTRAMUSCULAR | Status: AC | PRN
  Administered 2021-04-08: 1.5 mL

## 2021-04-08 MED ORDER — SODIUM HYALURONATE (VISCOSUP) 20 MG/2ML IX SOSY
20.0000 mg | PREFILLED_SYRINGE | INTRA_ARTICULAR | Status: AC | PRN
  Administered 2021-04-08: 20 mg via INTRA_ARTICULAR

## 2021-04-08 NOTE — Progress Notes (Signed)
? ?  Procedure Note ? ?Patient: Deanna Norris             ?Date of Birth: 16-Nov-1945           ?MRN: [id number]             ?Visit Date: 04/08/2021 ? ?Procedures: ?Visit Diagnoses:  ?1. Primary osteoarthritis of both knees   ?2. High risk medication use   ?3. Screening for tuberculosis   ? ?Euflexxa #1 Bilateral knee joint injections  ?Large Joint Inj: bilateral knee on 04/08/2021 1:03 PM ?Indications: pain ?Details: 27 G 1.5 in needle, medial approach ? ?Arthrogram: No ? ?Medications (Right): 1.5 mL lidocaine 1 %; 20 mg Sodium Hyaluronate 20 MG/2ML ?Aspirate (Right): 0 mL ?Medications (Left): 1.5 mL lidocaine 1 %; 20 mg Sodium Hyaluronate 20 MG/2ML ?Aspirate (Left): 0 mL ?Outcome: tolerated well, no immediate complications ?Procedure, treatment alternatives, risks and benefits explained, specific risks discussed. Consent was given by the patient. Immediately prior to procedure a time out was called to verify the correct patient, procedure, equipment, support staff and site/side marked as required. Patient was prepped and draped in the usual sterile fashion.  ? ? ? ?Patient tolerated the procedure well.  Aftercare was discussed.  ?Hazel Sams, PA-C  ? ?

## 2021-04-09 DIAGNOSIS — G4733 Obstructive sleep apnea (adult) (pediatric): Secondary | ICD-10-CM | POA: Diagnosis not present

## 2021-04-09 NOTE — Progress Notes (Signed)
? ?Office Visit Note ? ?Patient: [name]             ?Date of Birth: 1945/09/28           ?MRN: [id number]             ?PCP: Abner Greenspan, MD ?Referring: Tower, [name], MD ?Visit Date: 04/22/2021 ?Occupation: '@GUAROCC'$ @ ? ?Subjective:  ?Discuss DEXA  ? ?History of Present Illness: [name] is a 76 y.o. female with history of seropositive rheumatoid arthritis, ILD, osteoarthritis, and osteoporosis.  She remains on Orencia 125 mg sq injections every 7 days and Imuran 100 mg daily.  She is tolerating both medications without any side effects and has not missed any doses recently.  She denies any recent infections.  She states that she did have a bout of bronchitis and had to hold Orencia and Imuran for 3 weeks and during that time had some increased pain in both hands and her rib cage which has since resolved since resuming therapy.  She denies any other signs or symptoms of a rheumatoid arthritis flare.  She states that her knee joint pain and stiffness have started to improve since undergoing Visco gel injection series.  She will be having her third Euflexxa injection in both knees today.  She denies any knee joint swelling at this time. She denies any new or worsening pulmonary symptoms.  She was evaluated by Dr. [name] on 02/28/2021 and the office visit note was reviewed today in the office.  Of note she was switched from Pepcid to Zegerid for management of reflux.  She has noticed increased breakthrough symptoms especially in the evening since starting on cigarette.  She plans on resuming Pepcid twice daily as previously prescribed.   ?She presents today to discuss recent DEXA results as well as treatment options.  She has had 5 Reclast IV infusions in the past.  She has not had any recent falls or fractures.  She continues to take a calcium and vitamin D supplement on a daily basis. ? ? ?Activities of Daily Living:  ?Patient reports morning stiffness for 5-10 minutes.   ?Patient Denies  nocturnal pain.  ?Difficulty dressing/grooming: Denies ?Difficulty climbing stairs: Reports ?Difficulty getting out of chair: Reports ?Difficulty using hands for taps, buttons, cutlery, and/or writing: Denies ? ?Review of Systems  ?Constitutional:  Positive for fatigue.  ?HENT:  Positive for mouth dryness. Negative for mouth sores and nose dryness.   ?Eyes:  Positive for pain, itching and dryness. Negative for visual disturbance.  ?Respiratory:  Positive for shortness of breath. Negative for cough, hemoptysis and difficulty breathing.   ?Cardiovascular:  Negative for chest pain, palpitations, hypertension and swelling in legs/feet.  ?Gastrointestinal:  Positive for constipation. Negative for blood in stool and diarrhea.  ?Endocrine: Negative for increased urination.  ?Genitourinary:  Negative for difficulty urinating and painful urination.  ?Musculoskeletal:  Positive for joint pain, joint pain and morning stiffness. Negative for joint swelling, myalgias, muscle weakness, muscle tenderness and myalgias.  ?Skin:  Positive for rash. Negative for color change, pallor, hair loss, nodules/bumps, skin tightness, ulcers and sensitivity to sunlight.  ?Allergic/Immunologic: Positive for susceptible to infections.  ?Neurological:  Negative for dizziness, numbness, headaches, memory loss and weakness.  ?Hematological:  Negative for bruising/bleeding tendency and swollen glands.  ?Psychiatric/Behavioral:  Negative for depressed mood, confusion and sleep disturbance. The patient is not nervous/anxious.   ? ?PMFS History:  ?Patient Active Problem List  ? Diagnosis Date Noted  ? Contact with  and (suspected) exposure to covid-19 03/14/2021  ? Sore throat 03/14/2021  ? Upper respiratory infection, acute 03/14/2021  ? Tinnitus 11/09/2020  ? Preoperative respiratory examination 06/07/2020  ? ILD (interstitial lung disease) ([place])   ? Hearing loss 07/10/2019  ? Allergic rhinitis 04/08/2018  ? Immunocompromised state ([place]) 02/17/2018  ?  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  ? Primary osteoarthritis of both hands 01/11/2016  ? 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/11/2016  ? High risk medication use 11/15/2015  ? Sjogren's syndrome ([place]) 10/31/2015  ? Chronic constipation 10/23/2015  ? Rosacea 10/23/2015  ? Rheumatoid arthritis ([place]) 03/29/2015  ? Coronary artery calcification seen on CT scan 11/17/2013  ? Recurrent sinusitis 09/16/2013  ? Encounter for Medicare annual wellness exam 01/31/2013  ? Dyspnea on exertion 09/14/2012  ? Nodule of right lung 09/14/2012  ? Hyperlipidemia 05/08/2009  ? Tear film insufficiency 05/08/2009  ? Asthma 05/08/2009  ? GERD 05/08/2009  ? MENOPAUSAL DISORDER 05/08/2009  ? ARTHRITIS, RHEUMATOID 05/08/2009  ? Osteoporosis 05/08/2009  ? SPONDYLOLISTHESIS 05/08/2009  ? Sleep apnea 05/08/2009  ?  ?Past Medical History:  ?Diagnosis Date  ? Asthma   ? Constipation   ? Fibroid   ? GERD (gastroesophageal reflux disease)   ? Insomnia   ? Interstitial lung disease ([place])   ? Lumbar vertebral fracture ([facility])   ? L5-S1  ? Menopausal symptoms   ? Osteoporosis   ? Postmenopausal HRT (hormone replacement therapy)   ? RA (rheumatoid arthritis) ([place]) 11/2004  ? methotrexate and Embrel  ? Rosacea   ? Sjogren's disease ([place])   ? Sleep apnea 2008  ? C-Pap  ? Vitamin D deficiency   ?  ?Family History  ?Problem Relation Age of Onset  ? Breast cancer Sister 20  ? Thyroid disease Sister   ? Cancer Sister 34  ?     Leukemia--CLL  ? Heart disease Brother   ? ?Past Surgical History:  ?Procedure Laterality Date  ? ABDOMINAL HYSTERECTOMY  01/1993  ? partial with AP repair, secondary to fibroids  ? BREAST SURGERY  1991  ? breast biopsy  ? BRONCHIAL WASHINGS  06/07/2020  ? Procedure: BRONCHIAL WASHINGS;  Surgeon: Brand Males, MD;  Location: [facility];  Service: Endoscopy;;  ? CYSTOCELE REPAIR  10/2007  ? with graft  ? MELANOMA EXCISION Right 01/2012  ? excision right posterior arm  ? NASAL SINUS SURGERY   07/1991  ? VAGINAL DELIVERY    ? x3  ? VIDEO BRONCHOSCOPY N/A 06/07/2020  ? Procedure: VIDEO BRONCHOSCOPY WITHOUT FLUORO;  Surgeon: Brand Males, MD;  Location: [facility];  Service: Endoscopy;  Laterality: N/A;  ? ?Social History  ? ?Social History Narrative  ? Not on file  ? ?Immunization History  ?Administered Date(s) Administered  ? DTaP 04/06/2013  ? Influenza Split 10/22/2010  ? Influenza Whole 10/10/2009, 10/20/2011  ? Influenza, High Dose Seasonal PF 10/14/2016, 10/08/2018, 10/07/2019  ? Influenza, Seasonal, Injecte, Preservative Fre 10/06/2012  ? Influenza,inj,Quad PF,6+ Mos 10/30/2014  ? Influenza,inj,quad, With Preservative 10/12/2017  ? Influenza-Unspecified 10/26/2013, 10/22/2015, 11/02/2020  ? PFIZER(Purple Top)SARS-COV-2 Vaccination 06/02/2019, 08/26/2019  ? Pneumococcal Conjugate-13 02/13/2014  ? Pneumococcal Polysaccharide-23 06/12/2010, 03/28/2015  ? Td 02/07/2003, 03/23/2013  ? Tdap 03/23/2013  ? Zoster, Live 05/08/2014  ?  ? ?Objective: ?Vital Signs: BP 126/77 (BP Location: Left Arm, Patient Position: Sitting, Cuff Size: Normal)   Pulse 61   Ht '5\' 6"'$  (1.676 m)   Wt 176 lb (79.8 kg)   LMP 01/06/1993  BMI 28.41 kg/m?   ? ?Physical Exam ?Vitals and nursing note reviewed.  ?Constitutional:   ?   Appearance: She is well-developed.  ?HENT:  ?   Head: Normocephalic and atraumatic.  ?Eyes:  ?   Conjunctiva/sclera: Conjunctivae normal.  ?Cardiovascular:  ?   Rate and Rhythm: Normal rate and regular rhythm.  ?   Heart sounds: Normal heart sounds.  ?Pulmonary:  ?   Effort: Pulmonary effort is normal.  ?   Breath sounds: Normal breath sounds.  ?Abdominal:  ?   General: Bowel sounds are normal.  ?   Palpations: Abdomen is soft.  ?Musculoskeletal:  ?   Cervical back: Normal range of motion.  ?Skin: ?   General: Skin is warm and dry.  ?   Capillary Refill: Capillary refill takes less than 2 seconds.  ?Neurological:  ?   Mental Status: She is alert and oriented to person, place, and time.   ?Psychiatric:     ?   Behavior: Behavior normal.  ?  ? ?Musculoskeletal Exam: C-spine, thoracic spine, lumbar spine have good range of motion.  Shoulder joints, elbow joints, wrist joints, MCPs, PIPs, DIPs have good range

## 2021-04-09 NOTE — Progress Notes (Signed)
CMP WNL

## 2021-04-09 NOTE — Progress Notes (Signed)
Hgb and hct are borderline elevated but are trending down.   ?Absolute eosinophils remain elevated but have trended down.

## 2021-04-11 LAB — COMPLETE METABOLIC PANEL WITH GFR
AG Ratio: 1.6 (calc) (ref 1.0–2.5)
ALT: 19 U/L (ref 6–29)
AST: 22 U/L (ref 10–35)
Albumin: 3.8 g/dL (ref 3.6–5.1)
Alkaline phosphatase (APISO): 76 U/L (ref 37–153)
BUN: 16 mg/dL (ref 7–25)
CO2: 29 mmol/L (ref 20–32)
Calcium: 9.5 mg/dL (ref 8.6–10.4)
Chloride: 105 mmol/L (ref 98–110)
Creat: 0.69 mg/dL (ref 0.60–1.00)
Globulin: 2.4 g/dL (calc) (ref 1.9–3.7)
Glucose, Bld: 92 mg/dL (ref 65–99)
Potassium: 4.6 mmol/L (ref 3.5–5.3)
Sodium: 142 mmol/L (ref 135–146)
Total Bilirubin: 0.5 mg/dL (ref 0.2–1.2)
Total Protein: 6.2 g/dL (ref 6.1–8.1)
eGFR: 90 mL/min/{1.73_m2} (ref >=60)

## 2021-04-11 LAB — CBC WITH DIFFERENTIAL/PLATELET
Absolute Monocytes: 914 cells/uL (ref 200–950)
Basophils Absolute: 116 cells/uL (ref 0–200)
Basophils Relative: 1.1 %
Eosinophils Absolute: 809 cells/uL — ABNORMAL HIGH (ref 15–500)
Eosinophils Relative: 7.7 %
HCT: 46.5 % — ABNORMAL HIGH (ref 35.0–45.0)
Hemoglobin: 15.7 g/dL — ABNORMAL HIGH (ref 11.7–15.5)
Lymphs Abs: 2877 cells/uL (ref 850–3900)
MCH: 33 pg (ref 27.0–33.0)
MCHC: 33.8 g/dL (ref 32.0–36.0)
MCV: 97.7 fL (ref 80.0–100.0)
MPV: 11.2 fL (ref 7.5–12.5)
Monocytes Relative: 8.7 %
Neutro Abs: 5786 cells/uL (ref 1500–7800)
Neutrophils Relative %: 55.1 %
Platelets: 224 10*3/uL (ref 140–400)
RBC: 4.76 10*6/uL (ref 3.80–5.10)
RDW: 13.5 % (ref 11.0–15.0)
Total Lymphocyte: 27.4 %
WBC: 10.5 10*3/uL (ref 3.8–10.8)

## 2021-04-11 LAB — QUANTIFERON-TB GOLD PLUS
Mitogen-NIL: >10 IU/mL
NIL: 0.04 IU/mL
QuantiFERON-TB Gold Plus: NEGATIVE
TB1-NIL: 0.01 IU/mL
TB2-NIL: 0.01 IU/mL

## 2021-04-11 NOTE — Progress Notes (Signed)
TB gold negative

## 2021-04-15 ENCOUNTER — Ambulatory Visit (INDEPENDENT_AMBULATORY_CARE_PROVIDER_SITE_OTHER): Payer: Medicare Other | Admitting: Physician Assistant

## 2021-04-15 ENCOUNTER — Other Ambulatory Visit: Payer: Self-pay | Admitting: Rheumatology

## 2021-04-15 DIAGNOSIS — Z79899 Other long term (current) drug therapy: Secondary | ICD-10-CM

## 2021-04-15 DIAGNOSIS — M170 Bilateral primary osteoarthritis of knee: Secondary | ICD-10-CM

## 2021-04-15 MED ORDER — LIDOCAINE HCL 1 % IJ SOLN
1.5000 mL | INTRAMUSCULAR | Status: AC | PRN
  Administered 2021-04-15: 1.5 mL via INTRA_ARTICULAR

## 2021-04-15 MED ORDER — SODIUM HYALURONATE (VISCOSUP) 20 MG/2ML IX SOSY
20.0000 mg | PREFILLED_SYRINGE | INTRA_ARTICULAR | Status: AC | PRN
  Administered 2021-04-15: 20 mg via INTRA_ARTICULAR

## 2021-04-15 MED ORDER — ORENCIA 125 MG/ML ~~LOC~~ SOSY: PREFILLED_SYRINGE | SUBCUTANEOUS | 0 refills | Status: DC

## 2021-04-15 NOTE — Progress Notes (Signed)
? ?  Procedure Note ? ?Patient: [name]             ?Date of Birth: 1945/01/27           ?MRN: [id number]             ?Visit Date: 04/15/2021 ? ?Procedures: ?Visit Diagnoses:  ?1. Primary osteoarthritis of both knees   ? ? ?Euflexxa #2 bilateral knees, B/B ?Large Joint Inj: bilateral knee on 04/15/2021 11:24 AM ?Indications: pain ?Details: 27 G 1.5 in needle, medial approach ? ?Arthrogram: No ? ?Medications (Right): 1.5 mL lidocaine 1 %; 20 mg Sodium Hyaluronate 20 MG/2ML ?Aspirate (Right): 0 mL ?Medications (Left): 1.5 mL lidocaine 1 %; 20 mg Sodium Hyaluronate 20 MG/2ML ?Aspirate (Left): 0 mL ?Outcome: tolerated well, no immediate complications ?Procedure, treatment alternatives, risks and benefits explained, specific risks discussed. Consent was given by the patient. Immediately prior to procedure a time out was called to verify the correct patient, procedure, equipment, support staff and site/side marked as required. Patient was prepped and draped in the usual sterile fashion.  ? ? ? ?Patient tolerated the procedure well.  Aftercare was discussed.  ?Hazel Sams, PA-C  ?

## 2021-04-15 NOTE — Telephone Encounter (Signed)
Next Visit: 04/22/2021 ? ?Last Visit: 01/22/2021 ? ?Last Fill: 01/03/2021 ? ?[id number] arthritis involving multiple sites with positive rheumatoid factor ? ?Current Dose per office note on 01/22/2021: Orencia 125 mg sq injections every 7 days ? ?Labs: 04/08/2021 Hgb and hct are borderline elevated but are trending down.   ?Absolute eosinophils remain elevated but have trended down. CMP WNL ? ?TB Gold: 04/08/2021 negative   ? ?Okay to refill orencia?  ?

## 2021-04-15 NOTE — Telephone Encounter (Signed)
Patient called the office requesting a refill of Orencia '125mg'$ /ml be sent to [facility].  ?

## 2021-04-22 ENCOUNTER — Encounter: Payer: Self-pay | Admitting: Physician Assistant

## 2021-04-22 ENCOUNTER — Ambulatory Visit (INDEPENDENT_AMBULATORY_CARE_PROVIDER_SITE_OTHER): Payer: Medicare Other | Admitting: Physician Assistant

## 2021-04-22 ENCOUNTER — Telehealth: Payer: Self-pay

## 2021-04-22 VITALS — BP 126/77 | HR 61 | Ht 66.0 in | Wt 176.0 lb

## 2021-04-22 DIAGNOSIS — R778 Other specified abnormalities of plasma proteins: Secondary | ICD-10-CM | POA: Diagnosis not present

## 2021-04-22 DIAGNOSIS — M19071 Primary osteoarthritis, right ankle and foot: Secondary | ICD-10-CM

## 2021-04-22 DIAGNOSIS — M5136 Other intervertebral disc degeneration, lumbar region: Secondary | ICD-10-CM | POA: Diagnosis not present

## 2021-04-22 DIAGNOSIS — M0579 Rheumatoid arthritis with rheumatoid factor of multiple sites without organ or systems involvement: Secondary | ICD-10-CM

## 2021-04-22 DIAGNOSIS — Z872 Personal history of diseases of the skin and subcutaneous tissue: Secondary | ICD-10-CM

## 2021-04-22 DIAGNOSIS — M19072 Primary osteoarthritis, left ankle and foot: Secondary | ICD-10-CM

## 2021-04-22 DIAGNOSIS — M810 Age-related osteoporosis without current pathological fracture: Secondary | ICD-10-CM | POA: Diagnosis not present

## 2021-04-22 DIAGNOSIS — J849 Interstitial pulmonary disease, unspecified: Secondary | ICD-10-CM

## 2021-04-22 DIAGNOSIS — M3500 Sicca syndrome, unspecified: Secondary | ICD-10-CM | POA: Diagnosis not present

## 2021-04-22 DIAGNOSIS — M170 Bilateral primary osteoarthritis of knee: Secondary | ICD-10-CM | POA: Diagnosis not present

## 2021-04-22 DIAGNOSIS — Z8669 Personal history of other diseases of the nervous system and sense organs: Secondary | ICD-10-CM

## 2021-04-22 DIAGNOSIS — Z8639 Personal history of other endocrine, nutritional and metabolic disease: Secondary | ICD-10-CM

## 2021-04-22 DIAGNOSIS — Z8719 Personal history of other diseases of the digestive system: Secondary | ICD-10-CM

## 2021-04-22 DIAGNOSIS — M19042 Primary osteoarthritis, left hand: Secondary | ICD-10-CM

## 2021-04-22 DIAGNOSIS — Z79899 Other long term (current) drug therapy: Secondary | ICD-10-CM

## 2021-04-22 DIAGNOSIS — M19041 Primary osteoarthritis, right hand: Secondary | ICD-10-CM | POA: Diagnosis not present

## 2021-04-22 MED ORDER — AZATHIOPRINE 50 MG PO TABS: 100.0000 mg | ORAL_TABLET | Freq: Every day | ORAL | 0 refills | Status: DC

## 2021-04-22 MED ORDER — SODIUM HYALURONATE (VISCOSUP) 20 MG/2ML IX SOSY
20.0000 mg | PREFILLED_SYRINGE | INTRA_ARTICULAR | Status: AC | PRN
  Administered 2021-04-22: 20 mg via INTRA_ARTICULAR

## 2021-04-22 MED ORDER — LIDOCAINE HCL 1 % IJ SOLN
1.5000 mL | INTRAMUSCULAR | Status: AC | PRN
  Administered 2021-04-22: 1.5 mL via INTRA_ARTICULAR

## 2021-04-22 MED ORDER — ZOLPIDEM TARTRATE 5 MG PO TABS: 2.5000 mg | ORAL_TABLET | Freq: Every evening | ORAL | 0 refills | Status: DC | PRN

## 2021-04-22 NOTE — Progress Notes (Signed)
? ?  Procedure Note ? ?Patient: Deanna Norris             ?Date of Birth: 1945-11-06           ?MRN: [id number]             ?Visit Date: 04/22/2021 ? ?Procedures: ?Visit Diagnoses:  ?1. Rheumatoid arthritis involving multiple sites with positive rheumatoid factor ([place])   ?2. High risk medication use   ?3. 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 ILD (interstitial lung disease) ([place])   ?5. Primary osteoarthritis of both hands   ?6. Primary osteoarthritis of both knees   ?7. Primary osteoarthritis of both feet   ?8. DDD (degenerative disc disease), lumbar   ?9. Age-related osteoporosis without current pathological fracture   ?10. Abnormal SPEP   ?11. History of gastroesophageal reflux (GERD)   ?12. History of rosacea   ?13. History of hyperlipidemia   ?14. History of sleep apnea   ? ?Euflexa #3 bilateral knees, B/B ?Large Joint Inj: bilateral knee on 04/22/2021 1:16 PM ?Indications: pain ?Details: 27 G 1.5 in needle, medial approach ? ?Arthrogram: No ? ?Medications (Right): 1.5 mL lidocaine 1 %; 20 mg Sodium Hyaluronate 20 MG/2ML ?Aspirate (Right): 0 mL ?Medications (Left): 1.5 mL lidocaine 1 %; 20 mg Sodium Hyaluronate 20 MG/2ML ?Aspirate (Left): 0 mL ?Outcome: tolerated well, no immediate complications ?Procedure, treatment alternatives, risks and benefits explained, specific risks discussed. Consent was given by the patient. Immediately prior to procedure a time out was called to verify the correct patient, procedure, equipment, support staff and site/side marked as required. Patient was prepped and draped in the usual sterile fashion.  ? ? ?Patient tolerated the procedure well.  Aftercare was discussed.  ?Hazel Sams, PA-C  ? ?

## 2021-04-22 NOTE — Telephone Encounter (Signed)
Please apply for prolia per Hazel Sams, PA-C. Thanks!  ? ? ?

## 2021-04-23 DIAGNOSIS — H524 Presbyopia: Secondary | ICD-10-CM | POA: Diagnosis not present

## 2021-04-23 DIAGNOSIS — H04123 Dry eye syndrome of bilateral lacrimal glands: Secondary | ICD-10-CM | POA: Diagnosis not present

## 2021-04-23 DIAGNOSIS — H52223 Regular astigmatism, bilateral: Secondary | ICD-10-CM | POA: Diagnosis not present

## 2021-04-23 DIAGNOSIS — H2513 Age-related nuclear cataract, bilateral: Secondary | ICD-10-CM | POA: Diagnosis not present

## 2021-04-23 DIAGNOSIS — H5203 Hypermetropia, bilateral: Secondary | ICD-10-CM | POA: Diagnosis not present

## 2021-04-26 ENCOUNTER — Other Ambulatory Visit (HOSPITAL_COMMUNITY): Payer: Self-pay

## 2021-04-30 ENCOUNTER — Other Ambulatory Visit (HOSPITAL_COMMUNITY): Payer: Self-pay

## 2021-04-30 ENCOUNTER — Telehealth: Payer: Self-pay

## 2021-04-30 NOTE — Telephone Encounter (Signed)
Patient has coverage through Medicare [employer] which will cover 80% of coinsurance (once patient has met the $233 deductible), and Tricare will pick up the remaining 20%.  ?

## 2021-04-30 NOTE — Progress Notes (Signed)
GYNECOLOGY  VISIT ?  ?HPI: ?76 y.o.   Married  Caucasian  female   ?[id number] with Patient's last menstrual period was 01/06/1993.   ?here for pelvic ultrasound for abdominal bloating.  ? ?Had a hysterectomy in her 23s done for fibroids.  Ovaries remain.  ?States she has regretted at times that she still has her ovaries.  ? ?Has chronic constipation.  ? ?GYNECOLOGIC HISTORY: ?Patient's last menstrual period was 01/06/1993. ?Contraception:  Hyst ?Menopausal hormone therapy:  Vagifem  ?Last mammogram:   02-26-21 Neg/[facility]Rads1 ?Last pap smear:  10-18-14 Neg  ?       ?OB History   ? ? Gravida  ?3  ? Para  ?3  ? Term  ?3  ? Preterm  ?0  ? AB  ?0  ? Living  ?3  ?  ? ? SAB  ?0  ? IAB  ?0  ? Ectopic  ?0  ? Multiple  ?0  ? Live Births  ?3  ?   ?  ?  ?    ? ?Patient Active Problem List  ? Diagnosis Date Noted  ? Contact with and (suspected) exposure to covid-19 03/14/2021  ? Sore throat 03/14/2021  ? Upper respiratory infection, acute 03/14/2021  ? Tinnitus 11/09/2020  ? Preoperative respiratory examination 06/07/2020  ? ILD (interstitial lung disease) ([place])   ? Hearing loss 07/10/2019  ? Allergic rhinitis 04/08/2018  ? Immunocompromised state ([place]) 02/17/2018  ? 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  ? Primary osteoarthritis of both hands 01/11/2016  ? 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/11/2016  ? High risk medication use 11/15/2015  ? Sjogren's syndrome ([place]) 10/31/2015  ? Chronic constipation 10/23/2015  ? Rosacea 10/23/2015  ? Rheumatoid arthritis ([place]) 03/29/2015  ? Coronary artery calcification seen on CT scan 11/17/2013  ? Recurrent sinusitis 09/16/2013  ? Encounter for Medicare annual wellness exam 01/31/2013  ? Dyspnea on exertion 09/14/2012  ? Nodule of right lung 09/14/2012  ? Hyperlipidemia 05/08/2009  ? Tear film insufficiency 05/08/2009  ? Asthma 05/08/2009  ? GERD 05/08/2009  ? MENOPAUSAL DISORDER 05/08/2009  ? ARTHRITIS, RHEUMATOID 05/08/2009  ? Osteoporosis 05/08/2009  ?  SPONDYLOLISTHESIS 05/08/2009  ? Sleep apnea 05/08/2009  ? ? ?Past Medical History:  ?Diagnosis Date  ? Asthma   ? Constipation   ? Fibroid   ? GERD (gastroesophageal reflux disease)   ? Insomnia   ? Interstitial lung disease ([place])   ? Lumbar vertebral fracture ([facility])   ? L5-S1  ? Menopausal symptoms   ? Osteoporosis   ? Postmenopausal HRT (hormone replacement therapy)   ? RA (rheumatoid arthritis) ([place]) 11/2004  ? methotrexate and Embrel  ? Rosacea   ? Sjogren's disease ([place])   ? Sleep apnea 2008  ? C-Pap  ? Vitamin D deficiency   ? ? ?Past Surgical History:  ?Procedure Laterality Date  ? ABDOMINAL HYSTERECTOMY  01/1993  ? partial with AP repair, secondary to fibroids  ? BREAST SURGERY  1991  ? breast biopsy  ? BRONCHIAL WASHINGS  06/07/2020  ? Procedure: BRONCHIAL WASHINGS;  Surgeon: Brand Males, MD;  [facility]: [facility];  Service: Endoscopy;;  ? CYSTOCELE REPAIR  10/2007  ? with graft  ? MELANOMA EXCISION Right 01/2012  ? excision right posterior arm  ? NASAL SINUS SURGERY  07/1991  ? VAGINAL DELIVERY    ? x3  ? VIDEO BRONCHOSCOPY N/A 06/07/2020  ? Procedure: VIDEO BRONCHOSCOPY WITHOUT FLUORO;  Surgeon: Brand Males, MD;  Location: [facility];  Service: Endoscopy;  Laterality: N/A;  ? ? ?Current Outpatient Medications  ?Medication Sig Dispense Refill  ? acetaminophen (TYLENOL) 500 MG tablet Take 500-1,000 mg by mouth every 8 (eight) hours as needed for moderate pain.    ? albuterol (PROAIR HFA) 108 (90 Base) MCG/ACT inhaler Inhale 2 puffs into the lungs every 4 (four) hours as needed for wheezing or shortness of breath. 18 g 3  ? Ascorbic Acid (VITAMIN C PO) Take 500 mg by mouth daily.    ? aspirin 81 MG tablet Take 81 mg by mouth daily.    ? azaTHIOprine (IMURAN) 50 MG tablet Take 2 tablets (100 mg total) by mouth daily. 180 tablet 0  ? budesonide (PULMICORT) 180 MCG/ACT inhaler Inhale 1 puff into the lungs daily as needed (asthma).    ? Calcium Carbonate-Vitamin D (CALCIUM 500/D PO) Take 1 tablet by  mouth 2 (two) times daily.    ? Cholecalciferol (VITAMIN D3) 2000 UNITS TABS Take 2,000 Units by mouth daily.    ? cycloSPORINE (RESTASIS) 0.05 % ophthalmic emulsion Place 1 drop into both eyes 2 (two) times daily.    ? desonide (DESOWEN) 0.05 % lotion Apply 1 application topically daily as needed (irritation).    ? diclofenac Sodium (VOLTAREN) 1 % GEL Apply 2-4 grams to affected joint 4 times daily as needed. (Patient taking differently: Apply 2-4 g topically 4 (four) times daily as needed (pain). Apply 2-4 grams to affected joint 4 times daily as needed.) 400 g 2  ? Estradiol (VAGIFEM) 10 MCG TABS vaginal tablet Place 1 tablet (10 mcg total) vaginally 2 (two) times a week. Place one tablet (10 mcg) per vagina at bedtime twice a week. 24 tablet 3  ? Famotidine (PEPCID PO) Take by mouth 2 (two) times daily.    ? fluticasone (FLONASE) 50 MCG/ACT nasal spray USE 2 SPRAYS IN EACH NOSTRIL DAILY AS NEEDED FOR RHINITIS OR ALLERGIES (Patient taking differently: Place 2 sprays into both nostrils daily as needed for allergies. USE 2 SPRAYS IN EACH NOSTRIL DAILY AS NEEDED FOR RHINITIS OR ALLERGIES) 48 g 3  ? ibuprofen (ADVIL) 200 MG tablet Take 200-400 mg by mouth every 8 (eight) hours as needed (pain).    ? Magnesium Malate 1250 (141.7 Mg) MG TABS Take 3 tablets by mouth daily.     ? Multiple Vitamin (MULTI-VITAMINS) TABS Take 1 tablet by mouth daily.    ? Omega-3 1000 MG CAPS Take 1,000 mg by mouth daily.    ? Omeprazole-Sodium Bicarbonate (ZEGERID) 20-1100 MG CAPS capsule     ? ORENCIA 125 MG/ML SOSY INJECT THE CONTENTS OF 1 SYRINGE UNDER THE SKIN EVERY WEEK 12 mL 0  ? Propylene Glycol (SYSTANE BALANCE) 0.6 % SOLN Place 1 drop into both eyes daily as needed (dry eyes).    ? Zinc 50 MG TABS Take 50 mg by mouth daily.    ? zolpidem (AMBIEN) 5 MG tablet Take 0.5 tablets (2.5 mg total) by mouth at bedtime as needed for sleep. 15 tablet 0  ? ?No current facility-administered medications for this visit.  ?  ? ?ALLERGIES:  Adalimumab, Diclofenac, Dicyclomine, Doxycycline hyclate, Enbrel [etanercept], Fosamax [alendronate sodium], Methotrexate derivatives, Nickel, Piroxicam, Salagen [pilocarpine hcl], Sulfamethoxazole-trimethoprim, Tramadol hcl, and Plaquenil [hydroxychloroquine] ? ?Family History  ?Problem Relation Age of Onset  ? Breast cancer Sister 28  ? Thyroid disease Sister   ? Cancer Sister 53  ?     Leukemia--CLL  ? Heart disease Brother   ? ? ?Social History  ? ?  Socioeconomic History  ? Marital status: Married  ?  Spouse name: Not on file  ? Number of children: Not on file  ? Years of education: Not on file  ? Highest education level: Not on file  ?Occupational History  ? Occupation: housewife  ?  Employer: Not Employed  ?Tobacco Use  ? Smoking status: Never  ?  Passive exposure: Never  ? Smokeless tobacco: Never  ?Vaping Use  ? Vaping Use: Never used  ?Substance and Sexual Activity  ? Alcohol use: No  ?  Alcohol/week: 0.0 standard drinks  ? Drug use: No  ? Sexual activity: Not Currently  ?  Partners: Male  ?  Birth control/protection: Surgical  ?  Comment: TAH--<than 20 first sexual intercourse  ?Other Topics Concern  ? Not on file  ?Social History Narrative  ? Not on file  ? ?Social Determinants of Health  ? ?Financial Resource Strain: Low Risk   ? Difficulty of Paying Living Expenses: Not hard at all  ?Food Insecurity: No Food Insecurity  ? Worried About Charity fundraiser in the Last Year: Never true  ? Ran Out of Food in the Last Year: Never true  ?Transportation Needs: No Transportation Needs  ? Lack of Transportation (Medical): No  ? Lack of Transportation (Non-Medical): No  ?Physical Activity: Insufficiently Active  ? Days of Exercise per Week: 4 days  ? Minutes of Exercise per Session: 30 min  ?Stress: No Stress Concern Present  ? Feeling of Stress : Not at all  ?Social Connections: Socially Integrated  ? Frequency of Communication with Friends and Family: More than three times a week  ? Frequency of Social  Gatherings with Friends and Family: More than three times a week  ? Attends Religious Services: More than 4 times per year  ? Active Member of Clubs or Organizations: Yes  ? Attends Archivist Meetings: More t

## 2021-04-30 NOTE — Telephone Encounter (Signed)
Submitted a Prior Authorization request to [employer] for [employer] via CoverMyMeds (Key: [id number]). Received notification that medication is a plan exclusion and is not covered by Tricare. Will attempt to contact plan to ascertain coverage through medical benefits. ?

## 2021-05-03 NOTE — Telephone Encounter (Signed)
Patient can start Prolia in August 2023. ? ?Her last Reclast infusion was 08/08/20 ? ?Knox Saliva, PharmD, MPH, BCPS ?Clinical Pharmacist (Rheumatology and Pulmonology) ? ?

## 2021-05-07 ENCOUNTER — Ambulatory Visit (INDEPENDENT_AMBULATORY_CARE_PROVIDER_SITE_OTHER): Payer: Medicare Other

## 2021-05-07 ENCOUNTER — Encounter: Payer: Self-pay | Admitting: Obstetrics and Gynecology

## 2021-05-07 ENCOUNTER — Ambulatory Visit (INDEPENDENT_AMBULATORY_CARE_PROVIDER_SITE_OTHER): Payer: Medicare Other | Admitting: Obstetrics and Gynecology

## 2021-05-07 ENCOUNTER — Other Ambulatory Visit: Payer: Self-pay | Admitting: Obstetrics and Gynecology

## 2021-05-07 VITALS — BP 126/80 | Ht 66.0 in | Wt 179.0 lb

## 2021-05-07 DIAGNOSIS — R140 Abdominal distension (gaseous): Secondary | ICD-10-CM

## 2021-05-07 DIAGNOSIS — R1030 Lower abdominal pain, unspecified: Secondary | ICD-10-CM | POA: Diagnosis not present

## 2021-05-07 DIAGNOSIS — Z719 Counseling, unspecified: Secondary | ICD-10-CM

## 2021-05-13 DIAGNOSIS — L404 Guttate psoriasis: Secondary | ICD-10-CM | POA: Diagnosis not present

## 2021-05-13 DIAGNOSIS — B9689 Other specified bacterial agents as the cause of diseases classified elsewhere: Secondary | ICD-10-CM | POA: Diagnosis not present

## 2021-05-21 ENCOUNTER — Other Ambulatory Visit: Payer: Self-pay | Admitting: *Deleted

## 2021-05-21 MED ORDER — ZOLPIDEM TARTRATE 5 MG PO TABS: 2.5000 mg | ORAL_TABLET | Freq: Every evening | ORAL | 0 refills | Status: DC | PRN

## 2021-05-21 NOTE — Telephone Encounter (Signed)
Patient contacted the office requesting a refill on Ambien. Patient states she also recently saw her dermatologist who advised her that she has psoriasis. Patient states that she keeps having areas break out and would like to know if she may need her medications changed. Patient is on Orencia and Imuran. Patient states she will call and have the dermatology office send her last office note for review.  ? ?Next Visit: 09/27/2021 ? ?Last Visit: 04/22/2021 ? ?Last Fill: 04/22/2021 ? ?Current Dose per office note on 04/22/2021: not discussed ? ?Okay to refill Ambien?   ?

## 2021-05-21 NOTE — Telephone Encounter (Signed)
Attempted to contact patient and left message for patient to call the office.  

## 2021-05-21 NOTE — Telephone Encounter (Signed)
Patient advised Deanna Norris is indicated in the treatment of psoriasis.  Advised patient if her psoriasis is not controlled with the topical agents then we can discuss changing her medication.  Patient advised if she would like to discuss other medications then we can make her an appointment to do so. Patient states she will give it a couple of weeks to see if it improves.  ?

## 2021-05-21 NOTE — Telephone Encounter (Signed)
Deanna Norris is indicated in the treatment of psoriasis.  If her psoriasis is not controlled with the topical agents then we can discuss changing her medication.  If she wishes to make an earlier appointment we can schedule.

## 2021-06-11 ENCOUNTER — Other Ambulatory Visit: Payer: Self-pay | Admitting: Rheumatology

## 2021-06-11 DIAGNOSIS — Z79899 Other long term (current) drug therapy: Secondary | ICD-10-CM

## 2021-06-11 MED ORDER — ORENCIA 125 MG/ML ~~LOC~~ SOSY: PREFILLED_SYRINGE | SUBCUTANEOUS | 0 refills | Status: DC

## 2021-06-11 NOTE — Telephone Encounter (Signed)
Patient called the office requesting a refill of Orencia '125mg'$  be sent to [facility].

## 2021-06-11 NOTE — Telephone Encounter (Signed)
Next Visit: 09/27/2021  Last Visit: 04/22/2021  Last Fill: 04/15/2021   DX: Rheumatoid arthritis involving multiple sites with positive rheumatoid factor   Current Dose per office note 04/22/2021: Orencia 125 mg sq injections every 7 days   Labs: 04/08/2021 Hgb and hct are borderline elevated but are trending down.  Absolute eosinophils remain elevated but have trended down. CMP WNL  TB Gold: 04/08/2021 Neg    Okay to refill Orencia?

## 2021-06-20 ENCOUNTER — Other Ambulatory Visit: Payer: Self-pay | Admitting: Rheumatology

## 2021-06-20 MED ORDER — ZOLPIDEM TARTRATE 5 MG PO TABS: 2.5000 mg | ORAL_TABLET | Freq: Every evening | ORAL | 0 refills | Status: DC | PRN

## 2021-06-20 NOTE — Telephone Encounter (Signed)
Next Visit: 09/27/2021   Last Visit: 04/22/2021   Last Fill: 05/21/2021   Current Dose per office note on 04/22/2021: not discussed   Okay to refill Ambien?

## 2021-06-20 NOTE — Telephone Encounter (Signed)
Patient called the office requesting a refill of Ambien '5mg'$  be sent to [facility].

## 2021-07-02 ENCOUNTER — Telehealth: Payer: Self-pay | Admitting: Family Medicine

## 2021-07-02 DIAGNOSIS — E7800 Pure hypercholesterolemia, unspecified: Secondary | ICD-10-CM

## 2021-07-02 DIAGNOSIS — M0579 Rheumatoid arthritis with rheumatoid factor of multiple sites without organ or systems involvement: Secondary | ICD-10-CM

## 2021-07-02 DIAGNOSIS — M810 Age-related osteoporosis without current pathological fracture: Secondary | ICD-10-CM

## 2021-07-03 ENCOUNTER — Other Ambulatory Visit (INDEPENDENT_AMBULATORY_CARE_PROVIDER_SITE_OTHER): Payer: Medicare Other

## 2021-07-03 ENCOUNTER — Encounter: Payer: Medicare Other | Admitting: Family Medicine

## 2021-07-03 DIAGNOSIS — E7800 Pure hypercholesterolemia, unspecified: Secondary | ICD-10-CM

## 2021-07-03 DIAGNOSIS — M0579 Rheumatoid arthritis with rheumatoid factor of multiple sites without organ or systems involvement: Secondary | ICD-10-CM

## 2021-07-03 DIAGNOSIS — M810 Age-related osteoporosis without current pathological fracture: Secondary | ICD-10-CM | POA: Diagnosis not present

## 2021-07-03 LAB — COMPREHENSIVE METABOLIC PANEL
ALT: 19 U/L (ref 0–35)
AST: 23 U/L (ref 0–37)
Albumin: 3.8 g/dL (ref 3.5–5.2)
Alkaline Phosphatase: 70 U/L (ref 39–117)
BUN: 11 mg/dL (ref 6–23)
CO2: 26 mEq/L (ref 19–32)
Calcium: 8.5 mg/dL (ref 8.4–10.5)
Chloride: 106 mEq/L (ref 96–112)
Creatinine, Ser: 0.7 mg/dL (ref 0.40–1.20)
GFR: 84.43 mL/min (ref >60.00)
Glucose, Bld: 94 mg/dL (ref 70–99)
Potassium: 4.1 mEq/L (ref 3.5–5.1)
Sodium: 140 mEq/L (ref 135–145)
Total Bilirubin: 0.6 mg/dL (ref 0.2–1.2)
Total Protein: 6 g/dL (ref 6.0–8.3)

## 2021-07-03 LAB — CBC WITH DIFFERENTIAL/PLATELET
Basophils Absolute: 0.1 10*3/uL (ref 0.0–0.1)
Basophils Relative: 1.9 % (ref 0.0–3.0)
Eosinophils Absolute: 0.5 10*3/uL (ref 0.0–0.7)
Eosinophils Relative: 10.1 % — ABNORMAL HIGH (ref 0.0–5.0)
HCT: 43.8 % (ref 36.0–46.0)
Hemoglobin: 14.9 g/dL (ref 12.0–15.0)
Lymphocytes Relative: 29.7 % (ref 12.0–46.0)
Lymphs Abs: 1.5 10*3/uL (ref 0.7–4.0)
MCHC: 34.1 g/dL (ref 30.0–36.0)
MCV: 99.2 fl (ref 78.0–100.0)
Monocytes Absolute: 0.7 10*3/uL (ref 0.1–1.0)
Monocytes Relative: 13.6 % — ABNORMAL HIGH (ref 3.0–12.0)
Neutro Abs: 2.2 10*3/uL (ref 1.4–7.7)
Neutrophils Relative %: 44.7 % (ref 43.0–77.0)
Platelets: 206 10*3/uL (ref 150.0–400.0)
RBC: 4.42 Mil/uL (ref 3.87–5.11)
RDW: 15.4 % (ref 11.5–15.5)
WBC: 5 10*3/uL (ref 4.0–10.5)

## 2021-07-03 LAB — LIPID PANEL
Cholesterol: 204 mg/dL — ABNORMAL HIGH (ref 0–200)
HDL: 58.9 mg/dL (ref >39.00)
LDL Cholesterol: 120 mg/dL — ABNORMAL HIGH (ref 0–99)
NonHDL: 145.24
Total CHOL/HDL Ratio: 3
Triglycerides: 128 mg/dL (ref 0.0–149.0)
VLDL: 25.6 mg/dL (ref 0.0–40.0)

## 2021-07-03 LAB — VITAMIN D 25 HYDROXY (VIT D DEFICIENCY, FRACTURES): VITD: 68.98 ng/mL (ref 30.00–100.00)

## 2021-07-03 LAB — TSH: TSH: 0.91 u[IU]/mL (ref 0.35–5.50)

## 2021-07-10 ENCOUNTER — Encounter: Payer: Medicare Other | Admitting: Family Medicine

## 2021-07-15 ENCOUNTER — Ambulatory Visit (INDEPENDENT_AMBULATORY_CARE_PROVIDER_SITE_OTHER): Payer: Medicare Other | Admitting: Family Medicine

## 2021-07-15 ENCOUNTER — Telehealth: Payer: Self-pay

## 2021-07-15 DIAGNOSIS — M0579 Rheumatoid arthritis with rheumatoid factor of multiple sites without organ or systems involvement: Secondary | ICD-10-CM

## 2021-07-15 MED ORDER — FAMOTIDINE 20 MG PO TABS: 20.0000 mg | ORAL_TABLET | Freq: Two times a day (BID) | ORAL | 3 refills | Status: AC

## 2021-07-15 NOTE — Telephone Encounter (Signed)
Needs famotidine sent to [facility].

## 2021-07-15 NOTE — Telephone Encounter (Signed)
Rx sent electronically.  

## 2021-07-17 NOTE — Progress Notes (Signed)
I would probably send to Amy front office and see if she can cancel the appointment out.  Appt cancelled

## 2021-07-19 ENCOUNTER — Telehealth (INDEPENDENT_AMBULATORY_CARE_PROVIDER_SITE_OTHER): Payer: Medicare Other | Admitting: Family Medicine

## 2021-07-19 ENCOUNTER — Encounter: Payer: Self-pay | Admitting: Family Medicine

## 2021-07-19 VITALS — Ht 66.0 in | Wt 171.0 lb

## 2021-07-19 DIAGNOSIS — M0579 Rheumatoid arthritis with rheumatoid factor of multiple sites without organ or systems involvement: Secondary | ICD-10-CM

## 2021-07-19 DIAGNOSIS — R61 Generalized hyperhidrosis: Secondary | ICD-10-CM

## 2021-07-19 DIAGNOSIS — E7800 Pure hypercholesterolemia, unspecified: Secondary | ICD-10-CM

## 2021-07-19 DIAGNOSIS — L409 Psoriasis, unspecified: Secondary | ICD-10-CM | POA: Insufficient documentation

## 2021-07-19 NOTE — Progress Notes (Unsigned)
   Subjective:    Patient ID: Deanna Norris, female    DOB: 11/24/1945, 76 y.o.   MRN: [id number]  HPI Pt presented for f/u of chronic health problems and review labs and for a rash on arms and leg   BP Readings from Last 3 Encounters:  05/07/21 126/80  04/22/21 126/77  04/03/21 132/76   Pulse Readings from Last 3 Encounters:  04/22/21 61  04/03/21 (!) 54  03/17/21 67   Feeling fine   Will be moving to [facility] Will live 2 doors down from her kids and grand kids   Has started sweating more lately  Hot weather is worse   No fever    She is seen by gyn Followed by rheum and pulm with h/o ILD     RA and sjogren's- takes orencia and mtx and folic acid in the past   Has developed psoriasis on arms and legs  She went to see Dr Jarome Matin -derm  Now all over legs and arms  Gave her triamcinolone  Did not use a lot- has stopped it    Hyperlipidemia  Lab Results  Component Value Date   CHOL 204 (H) 07/03/2021   CHOL 215 (H) 06/18/2020   CHOL 216 (H) 06/16/2019   Lab Results  Component Value Date   HDL 58.90 07/03/2021   HDL 55.60 06/18/2020   HDL 59.50 06/16/2019   Lab Results  Component Value Date   LDLCALC 120 (H) 07/03/2021   LDLCALC 129 (H) 06/18/2020   LDLCALC 141 (H) 06/16/2019   Lab Results  Component Value Date   TRIG 128.0 07/03/2021   TRIG 151.0 (H) 06/18/2020   TRIG 78.0 06/16/2019   Lab Results  Component Value Date   CHOLHDL 3 07/03/2021   CHOLHDL 4 06/18/2020   CHOLHDL 4 06/16/2019   Lab Results  Component Value Date   LDLDIRECT 144.2 02/02/2013   LDLDIRECT 137.3 02/02/2012  Diet controlled  LDL is down  Trig are down     Other labs Lab Results  Component Value Date   CREATININE 0.70 07/03/2021   BUN 11 07/03/2021   NA 140 07/03/2021   K 4.1 07/03/2021   CL 106 07/03/2021   CO2 26 07/03/2021   Lab Results  Component Value Date   ALT 19 07/03/2021   AST 23 07/03/2021   ALKPHOS 70 07/03/2021   BILITOT 0.6  07/03/2021   Lab Results  Component Value Date   WBC 5.0 07/03/2021   HGB 14.9 07/03/2021   HCT 43.8 07/03/2021   MCV 99.2 07/03/2021   PLT 206.0 07/03/2021   Glucose 94 Lab Results  Component Value Date   TSH 0.91 07/03/2021        Review of Systems     Objective:   Physical Exam        Assessment & Plan:

## 2021-07-19 NOTE — Assessment & Plan Note (Signed)
Continues rheumatology care Now some psoriasis also More sweats lately, unsure if this could be medication related

## 2021-07-19 NOTE — Assessment & Plan Note (Signed)
Disc goals for lipids and reasons to control them Rev last labs with pt Rev low sat fat diet in detail Improved with dec LDL to 120 and dec trig  Enc her to keep up the good work

## 2021-07-19 NOTE — Assessment & Plan Note (Signed)
In setting of RA Seeing Dr Jarome Matin  Has triamcinolone cream prn-does not always use due to lack of itching Plans to address with rheumatology

## 2021-07-19 NOTE — Assessment & Plan Note (Signed)
More than usual/hot natured Unsure if due to climate or possibly RA or medication  Feels ok/no other symptoms  Enc to increase fluids Start checking temp intermittently  Check in with rheumatology if it continues and f/u here  Labs are reassuring today

## 2021-07-21 NOTE — Progress Notes (Signed)
Virtual Visit via Video Note  I connected with Deanna Norris on 07/21/21 at  4:00 PM EDT by a video enabled telemedicine application and verified that I am speaking with the correct person using two identifiers.  Location: Patient: home Provider: office   I discussed the limitations of evaluation and management by telemedicine and the availability of in person appointments. The patient expressed understanding and agreed to proceed.  History of Present Illness:  Pt presented for f/u of chronic health problems and review labs and for a rash on arms and leg   BP Readings from Last 3 Encounters:  05/07/21 126/80  04/22/21 126/77  04/03/21 132/76   Pulse Readings from Last 3 Encounters:  04/22/21 61  04/03/21 (!) 54  03/17/21 67   Feeling fine   Will be moving to [facility] Will live 2 doors down from her kids and grand kids   Has started sweating more lately  Hot weather is worse   No fever    She is seen by gyn Followed by rheum and pulm with h/o ILD   RA and sjogren's- takes orencia and mtx and folic acid in the past   Has developed psoriasis on arms and legs  She went to see Dr Jarome Matin -derm  Now all over legs and arms  Gave her triamcinolone  Did not use a lot- has stopped it    Hyperlipidemia  Lab Results  Component Value Date   CHOL 204 (H) 07/03/2021   CHOL 215 (H) 06/18/2020   CHOL 216 (H) 06/16/2019   Lab Results  Component Value Date   HDL 58.90 07/03/2021   HDL 55.60 06/18/2020   HDL 59.50 06/16/2019   Lab Results  Component Value Date   LDLCALC 120 (H) 07/03/2021   LDLCALC 129 (H) 06/18/2020   LDLCALC 141 (H) 06/16/2019   Lab Results  Component Value Date   TRIG 128.0 07/03/2021   TRIG 151.0 (H) 06/18/2020   TRIG 78.0 06/16/2019   Lab Results  Component Value Date   CHOLHDL 3 07/03/2021   CHOLHDL 4 06/18/2020   CHOLHDL 4 06/16/2019   Lab Results  Component Value Date   LDLDIRECT 144.2 02/02/2013   LDLDIRECT 137.3  02/02/2012  Diet controlled  LDL is down  Trig are down     Other labs Lab Results  Component Value Date   CREATININE 0.70 07/03/2021   BUN 11 07/03/2021   NA 140 07/03/2021   K 4.1 07/03/2021   CL 106 07/03/2021   CO2 26 07/03/2021   Lab Results  Component Value Date   ALT 19 07/03/2021   AST 23 07/03/2021   ALKPHOS 70 07/03/2021   BILITOT 0.6 07/03/2021   Lab Results  Component Value Date   WBC 5.0 07/03/2021   HGB 14.9 07/03/2021   HCT 43.8 07/03/2021   MCV 99.2 07/03/2021   PLT 206.0 07/03/2021   Glucose 94 Lab Results  Component Value Date   TSH 0.91 07/03/2021      Patient Active Problem List   Diagnosis Date Noted   Psoriasis 07/19/2021   Sweating increase 07/19/2021   Contact with and (suspected) exposure to covid-19 03/14/2021   Sore throat 03/14/2021   Tinnitus 11/09/2020   Preoperative respiratory examination 06/07/2020   ILD (interstitial lung disease) ([place])    Hearing loss 07/10/2019   Allergic rhinitis 04/08/2018   Immunocompromised state ([place]) 02/17/2018   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   Primary osteoarthritis of  both hands 01/11/2016   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/11/2016   High risk medication use 11/15/2015   Sjogren's syndrome ([place]) 10/31/2015   Chronic constipation 10/23/2015   Rosacea 10/23/2015   Rheumatoid arthritis ([place]) 03/29/2015   Coronary artery calcification seen on CT scan 11/17/2013   Encounter for Medicare annual wellness exam 01/31/2013   Dyspnea on exertion 09/14/2012   Nodule of right lung 09/14/2012   Hyperlipidemia 05/08/2009   Tear film insufficiency 05/08/2009   Asthma 05/08/2009   GERD 05/08/2009   MENOPAUSAL DISORDER 05/08/2009   ARTHRITIS, RHEUMATOID 05/08/2009   Osteoporosis 05/08/2009   SPONDYLOLISTHESIS 05/08/2009   Sleep apnea 05/08/2009   Past Medical History:  Diagnosis Date   Asthma    Constipation    Fibroid    GERD (gastroesophageal  reflux disease)    Insomnia    Interstitial lung disease (HCC)    Lumbar vertebral fracture (HCC)    L5-S1   Menopausal symptoms    Osteoporosis    Postmenopausal HRT (hormone replacement therapy)    RA (rheumatoid arthritis) ([place]) 11/2004   methotrexate and Embrel   Rosacea    Sjogren's disease ([place])    Sleep apnea 2008   C-Pap   Vitamin D deficiency    Past Surgical History:  Procedure Laterality Date   ABDOMINAL HYSTERECTOMY  01/1993   partial with AP repair, secondary to fibroids   BREAST SURGERY  1991   breast biopsy   BRONCHIAL WASHINGS  06/07/2020   Procedure: BRONCHIAL WASHINGS;  Surgeon: Brand Males, MD;  Location: [facility];  Service: Endoscopy;;   CYSTOCELE REPAIR  10/2007   with graft   MELANOMA EXCISION Right 01/2012   excision right posterior arm   NASAL SINUS SURGERY  07/1991   VAGINAL DELIVERY     x3   VIDEO BRONCHOSCOPY N/A 06/07/2020   Procedure: VIDEO BRONCHOSCOPY WITHOUT FLUORO;  Surgeon: Brand Males, MD;  Location: [facility];  Service: Endoscopy;  Laterality: N/A;   Social History   Tobacco Use   Smoking status: Never    Passive exposure: Never   Smokeless tobacco: Never  Vaping Use   Vaping Use: Never used  Substance Use Topics   Alcohol use: No    Alcohol/week: 0.0 standard drinks of alcohol   Drug use: No   Family History  Problem Relation Age of Onset   Breast cancer Sister 42   Thyroid disease Sister    Cancer Sister 63       Leukemia--CLL   Heart disease Brother    Allergies  Allergen Reactions   Adalimumab     REACTION: frequent sinus infection   Diclofenac     unk   Dicyclomine     Turn teeth gray   Doxycycline Hyclate Other (See Comments)    "turns teeth gray"   Enbrel [Etanercept]     SINUS INFECTIONS   Fosamax [Alendronate Sodium]     Acid reflux   Methotrexate Derivatives Nausea Only    Oral metotrexate   Nickel    Piroxicam     unk   Salagen [Pilocarpine Hcl] Other (See Comments)    Excessive  sweating, headaches   Sulfamethoxazole-Trimethoprim     REACTION: face and hands rash and felt burning sensation   Tramadol Hcl     REACTION: nausea   Plaquenil [Hydroxychloroquine] Rash   Current Outpatient Medications on File Prior to Visit  Medication Sig Dispense Refill   albuterol (PROAIR HFA) 108 (90 Base) MCG/ACT inhaler Inhale 2 puffs into  the lungs every 4 (four) hours as needed for wheezing or shortness of breath. 18 g 3   Ascorbic Acid (VITAMIN C PO) Take 500 mg by mouth daily.     aspirin 81 MG tablet Take 81 mg by mouth daily.     azaTHIOprine (IMURAN) 50 MG tablet Take 2 tablets (100 mg total) by mouth daily. 180 tablet 0   budesonide (PULMICORT) 180 MCG/ACT inhaler Inhale 1 puff into the lungs daily as needed (asthma).     Calcium Carbonate-Vitamin D (CALCIUM 500/D PO) Take 1 tablet by mouth 2 (two) times daily.     Cholecalciferol (VITAMIN D3) 2000 UNITS TABS Take 2,000 Units by mouth daily.     desonide (DESOWEN) 0.05 % lotion Apply 1 application topically daily as needed (irritation).     Estradiol (VAGIFEM) 10 MCG TABS vaginal tablet Place 1 tablet (10 mcg total) vaginally 2 (two) times a week. Place one tablet (10 mcg) per vagina at bedtime twice a week. 24 tablet 3   famotidine (PEPCID) 20 MG tablet Take 1 tablet (20 mg total) by mouth 2 (two) times daily. 180 tablet 3   ibuprofen (ADVIL) 200 MG tablet Take 200-400 mg by mouth every 8 (eight) hours as needed (pain).     Magnesium Malate 1250 (141.7 Mg) MG TABS Take 3 tablets by mouth daily.      Multiple Vitamin (MULTI-VITAMINS) TABS Take 1 tablet by mouth daily.     Omega-3 1000 MG CAPS Take 1,000 mg by mouth daily.     Propylene Glycol (SYSTANE BALANCE) 0.6 % SOLN Place 1 drop into both eyes daily as needed (dry eyes).     Zinc 50 MG TABS Take 50 mg by mouth daily.     zolpidem (AMBIEN) 5 MG tablet Take 0.5 tablets (2.5 mg total) by mouth at bedtime as needed for sleep. 15 tablet 0   diclofenac Sodium (VOLTAREN) 1 %  GEL Apply 2-4 grams to affected joint 4 times daily as needed. (Patient not taking: Reported on 07/19/2021) 400 g 2   Omeprazole-Sodium Bicarbonate (ZEGERID) 20-1100 MG CAPS capsule  (Patient not taking: Reported on 07/19/2021)     ORENCIA 125 MG/ML SOSY INJECT THE CONTENTS OF 1 SYRINGE UNDER THE SKIN EVERY WEEK 12 mL 0   No current facility-administered medications on file prior to visit.   Review of Systems  Constitutional:  Negative for chills, fever and malaise/fatigue.  HENT:  Negative for congestion, ear pain, sinus pain and sore throat.   Eyes:  Negative for blurred vision, discharge and redness.  Respiratory:  Positive for shortness of breath. Negative for cough and stridor.        Sob at times from ILD  Cardiovascular:  Negative for chest pain, palpitations and leg swelling.  Gastrointestinal:  Negative for abdominal pain, diarrhea, nausea and vomiting.  Musculoskeletal:  Positive for back pain and joint pain. Negative for myalgias.  Skin:  Negative for rash.  Neurological:  Negative for dizziness and headaches.    Observations/Objective: Patient appears well, in no distress Weight is baseline  No facial swelling or asymmetry Normal voice-not hoarse and no slurred speech No obvious tremor or mobility impairment Moving neck and UEs normally Able to hear the call well  No cough or shortness of breath during interview  Talkative and mentally sharp with no cognitive changes Small -2-3 cm areas of scale and erythema on forearms resembling psoriasis  Affect is normal    Assessment and Plan: Problem List Items Addressed This Visit  Musculoskeletal and Integument   Rheumatoid arthritis ([place]) - Primary (Chronic)    Continues rheumatology care Now some psoriasis also More sweats lately, unsure if this could be medication related       Psoriasis    In setting of RA Seeing Dr Jarome Matin  Has triamcinolone cream prn-does not always use due to lack of itching Plans to  address with rheumatology        Other   Hyperlipidemia    Disc goals for lipids and reasons to control them Rev last labs with pt Rev low sat fat diet in detail Improved with dec LDL to 120 and dec trig  Enc her to keep up the good work      Sweating increase    More than usual/hot natured Unsure if due to climate or possibly RA or medication  Feels ok/no other symptoms  Enc to increase fluids Start checking temp intermittently  Check in with rheumatology if it continues and f/u here  Labs are reassuring today        Follow Up Instructions:   discuss your increase in sweating with the rheumatologist See dermatology for psoriasis if it worsens Take care of yourself    I discussed the assessment and treatment plan with the patient. The patient was provided an opportunity to ask questions and all were answered. The patient agreed with the plan and demonstrated an understanding of the instructions.   The patient was advised to call back or seek an in-person evaluation if the symptoms worsen or if the condition fails to improve as anticipated.     Loura Pardon, MD

## 2021-07-22 ENCOUNTER — Other Ambulatory Visit: Payer: Self-pay | Admitting: Rheumatology

## 2021-07-22 MED ORDER — ZOLPIDEM TARTRATE 5 MG PO TABS
2.5000 mg | ORAL_TABLET | Freq: Every evening | ORAL | 0 refills | Status: DC | PRN
Start: 2021-07-22 — End: 2021-08-19

## 2021-07-22 NOTE — Telephone Encounter (Signed)
Patient called the office requesting a refill of Ambien '5mg'$  to be sent to [facility].

## 2021-07-22 NOTE — Telephone Encounter (Signed)
Next Visit: 09/27/2021   Last Visit: 04/22/2021   Last Fill: 06/20/2021   Current Dose per office note on 04/22/2021: not discussed   Okay to refill Ambien?

## 2021-08-05 ENCOUNTER — Other Ambulatory Visit (HOSPITAL_COMMUNITY): Payer: Self-pay

## 2021-08-08 NOTE — Telephone Encounter (Signed)
Spoke with patient regarding transitioning to [facility]. She states she has found rheumatology in [name] but she has not yet decided if she wants to transfer her care from [place] to [place]. She also sees Dr. [name] for ILD and is reluctant to leave his specialty care.  She has a condo in [place] and will be going back and forth for the next couple of years at least to help with her grandson and has yet to decide. She states she'd like to further discuss at [place] in September with Dr. Estanislado Pandy.  Knox Saliva, PharmD, MPH, BCPS, CPP Clinical Pharmacist (Rheumatology and Pulmonology)

## 2021-08-14 ENCOUNTER — Other Ambulatory Visit: Payer: Self-pay | Admitting: Rheumatology

## 2021-08-14 DIAGNOSIS — Z79899 Other long term (current) drug therapy: Secondary | ICD-10-CM

## 2021-08-14 MED ORDER — AZATHIOPRINE 50 MG PO TABS
100.0000 mg | ORAL_TABLET | Freq: Every day | ORAL | 0 refills | Status: DC
Start: 2021-08-14 — End: 2021-11-06

## 2021-08-14 MED ORDER — ORENCIA 125 MG/ML ~~LOC~~ SOSY: PREFILLED_SYRINGE | SUBCUTANEOUS | 2 refills | Status: DC

## 2021-08-14 NOTE — Telephone Encounter (Signed)
Next Visit: 09/27/2021  Last Visit: 04/22/2021  DX: Rheumatoid arthritis involving multiple sites with positive rheumatoid factor   Current Dose per office note 04/22/2021: Orencia 125 mg sq injections every 7 days. Imuran 100 mg daily  Labs: 07/03/2021 Monocytes Relative 13.6, Eosinophils Relative 10.1  TB Gold: 04/08/2021   Okay to refill Orencia and Imuran?

## 2021-08-14 NOTE — Telephone Encounter (Signed)
Patient called requesting prescription refills of Orencia and Azathioprine to be sent to [facility].

## 2021-08-19 ENCOUNTER — Other Ambulatory Visit: Payer: Self-pay | Admitting: Rheumatology

## 2021-08-19 MED ORDER — ZOLPIDEM TARTRATE 5 MG PO TABS: 2.5000 mg | ORAL_TABLET | Freq: Every evening | ORAL | 0 refills | Status: DC | PRN

## 2021-08-19 NOTE — Telephone Encounter (Signed)
Next Visit: 09/27/2021   Last Visit: 04/22/2021   Last Fill: 07/22/2021   Current Dose per office note on 04/22/2021: not discussed   Okay to refill Ambien?

## 2021-08-19 NOTE — Telephone Encounter (Signed)
Patient called requesting prescription refill of Zolpidem to be sent to [facility].

## 2021-09-03 DIAGNOSIS — D1801 Hemangioma of skin and subcutaneous tissue: Secondary | ICD-10-CM | POA: Diagnosis not present

## 2021-09-03 DIAGNOSIS — D2262 Melanocytic nevi of left upper limb, including shoulder: Secondary | ICD-10-CM | POA: Diagnosis not present

## 2021-09-03 DIAGNOSIS — L814 Other melanin hyperpigmentation: Secondary | ICD-10-CM | POA: Diagnosis not present

## 2021-09-03 DIAGNOSIS — L821 Other seborrheic keratosis: Secondary | ICD-10-CM | POA: Diagnosis not present

## 2021-09-03 DIAGNOSIS — L400 Psoriasis vulgaris: Secondary | ICD-10-CM | POA: Diagnosis not present

## 2021-09-03 DIAGNOSIS — D225 Melanocytic nevi of trunk: Secondary | ICD-10-CM | POA: Diagnosis not present

## 2021-09-03 DIAGNOSIS — Z85820 Personal history of malignant melanoma of skin: Secondary | ICD-10-CM | POA: Diagnosis not present

## 2021-09-03 DIAGNOSIS — D2261 Melanocytic nevi of right upper limb, including shoulder: Secondary | ICD-10-CM | POA: Diagnosis not present

## 2021-09-12 ENCOUNTER — Telehealth (INDEPENDENT_AMBULATORY_CARE_PROVIDER_SITE_OTHER): Payer: Medicare Other | Admitting: Family Medicine

## 2021-09-12 ENCOUNTER — Encounter: Payer: Self-pay | Admitting: Family Medicine

## 2021-09-12 DIAGNOSIS — J0100 Acute maxillary sinusitis, unspecified: Secondary | ICD-10-CM

## 2021-09-12 MED ORDER — AMOXICILLIN-POT CLAVULANATE 875-125 MG PO TABS: 1.0000 | ORAL_TABLET | Freq: Two times a day (BID) | ORAL | 0 refills | Status: DC

## 2021-09-12 NOTE — Assessment & Plan Note (Signed)
tx with augmentin -has tolerated well  Maxillary/ L side with tenderness (cleared by dentist is not a dental problem) Fluids /rest  Nasal saline if needed  Follow up if not improving or worse  ER precautions rev

## 2021-09-12 NOTE — Progress Notes (Signed)
Virtual Visit via Video Note  I connected with Deanna Norris on 09/12/21 at 12:00 PM EDT by a video enabled telemedicine application and verified that I am speaking with the correct person using two identifiers.  Location: Patient: home (lives in [place]) Provider: office    I discussed the limitations of evaluation and management by telemedicine and the availability of in person appointments. The patient expressed understanding and agreed to proceed.  Video failed today and visit was done by phone   Parties involved in encounter  Patient: Deanna Norris   Provider:  Loura Pardon MD   History of Present Illness:  Pt presents with sinus symptoms  She has a h/o RA and immunocomp status   A week ago she started to have some pain in L face and in the roof of her mouth  No dental problems (usually dental xrays are ok) She is easy to get sinus infections (even without a cold)   Her cpap mask hurt her face last night -noted tenderness  No fever  (sweating more)  Not congested  No colored nasal discharge  Thinks her face looked a little full on the L side yesterday-puffy   Some sensitivity of tooth on L - for years and had it checked last week and it was fine    Pain is worse below eye than above  Little headache yesterday  No ST  Some fatigue   Ears feel more irritated-had to take out hearing aides  No cough   Otc : ibuprofen   Patient Active Problem List   Diagnosis Date Noted   Psoriasis 07/19/2021   Sweating increase 07/19/2021   Contact with and (suspected) exposure to covid-19 03/14/2021   Sore throat 03/14/2021   Tinnitus 11/09/2020   Preoperative respiratory examination 06/07/2020   ILD (interstitial lung disease) ([place])    Hearing loss 07/10/2019   Allergic rhinitis 04/08/2018   Immunocompromised state ([place]) 02/17/2018   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   Primary osteoarthritis of both hands 01/11/2016   Primary  osteoarthritis of both knees 01/11/2016   High risk medication use 11/15/2015   Sjogren's syndrome ([place]) 10/31/2015   Chronic constipation 10/23/2015   Rosacea 10/23/2015   Rheumatoid arthritis ([place]) 03/29/2015   Coronary artery calcification seen on CT scan 11/17/2013   Encounter for Medicare annual wellness exam 01/31/2013   Dyspnea on exertion 09/14/2012   Nodule of right lung 09/14/2012   Acute sinusitis 02/12/2011   Hyperlipidemia 05/08/2009   Tear film insufficiency 05/08/2009   Asthma 05/08/2009   GERD 05/08/2009   MENOPAUSAL DISORDER 05/08/2009   ARTHRITIS, RHEUMATOID 05/08/2009   Osteoporosis 05/08/2009   SPONDYLOLISTHESIS 05/08/2009   Sleep apnea 05/08/2009   Past Medical History:  Diagnosis Date   Asthma    Constipation    Fibroid    GERD (gastroesophageal reflux disease)    Insomnia    Interstitial lung disease ([place])    Lumbar vertebral fracture ([facility])    L5-S1   Menopausal symptoms    Osteoporosis    Postmenopausal HRT (hormone replacement therapy)    RA (rheumatoid arthritis) ([place]) 11/2004   methotrexate and Embrel   Rosacea    Sjogren's disease ([place])    Sleep apnea 2008   C-Pap   Vitamin D deficiency    Past Surgical History:  Procedure Laterality Date   ABDOMINAL HYSTERECTOMY  01/1993   partial with AP repair, secondary to fibroids   BREAST SURGERY  1991   breast biopsy  BRONCHIAL WASHINGS  06/07/2020   Procedure: BRONCHIAL WASHINGS;  Surgeon: Brand Males, MD;  Location: [facility];  Service: Endoscopy;;   CYSTOCELE REPAIR  10/2007   with graft   MELANOMA EXCISION Right 01/2012   excision right posterior arm   NASAL SINUS SURGERY  07/1991   VAGINAL DELIVERY     x3   VIDEO BRONCHOSCOPY N/A 06/07/2020   Procedure: VIDEO BRONCHOSCOPY WITHOUT FLUORO;  Surgeon: Brand Males, MD;  Location: [facility];  Service: Endoscopy;  Laterality: N/A;   Social History   Tobacco Use   Smoking status: Never    Passive exposure: Never   Smokeless  tobacco: Never  Vaping Use   Vaping Use: Never used  Substance Use Topics   Alcohol use: No    Alcohol/week: 0.0 standard drinks of alcohol   Drug use: No   Family History  Problem Relation Age of Onset   Breast cancer Sister 45   Thyroid disease Sister    Cancer Sister 63       Leukemia--CLL   Heart disease Brother    Allergies  Allergen Reactions   Adalimumab     REACTION: frequent sinus infection   Diclofenac     unk   Dicyclomine     Turn teeth gray   Doxycycline Hyclate Other (See Comments)    "turns teeth gray"   Enbrel [Etanercept]     SINUS INFECTIONS   Fosamax [Alendronate Sodium]     Acid reflux   Methotrexate Derivatives Nausea Only    Oral metotrexate   Nickel    Piroxicam     unk   Salagen [Pilocarpine Hcl] Other (See Comments)    Excessive sweating, headaches   Sulfamethoxazole-Trimethoprim     REACTION: face and hands rash and felt burning sensation   Tramadol Hcl     REACTION: nausea   Plaquenil [Hydroxychloroquine] Rash   Current Outpatient Medications on File Prior to Visit  Medication Sig Dispense Refill   albuterol (PROAIR HFA) 108 (90 Base) MCG/ACT inhaler Inhale 2 puffs into the lungs every 4 (four) hours as needed for wheezing or shortness of breath. 18 g 3   Ascorbic Acid (VITAMIN C PO) Take 500 mg by mouth daily.     aspirin 81 MG tablet Take 81 mg by mouth daily.     azaTHIOprine (IMURAN) 50 MG tablet Take 2 tablets (100 mg total) by mouth daily. 180 tablet 0   budesonide (PULMICORT) 180 MCG/ACT inhaler Inhale 1 puff into the lungs daily as needed (asthma).     Calcium Carbonate-Vitamin D (CALCIUM 500/D PO) Take 1 tablet by mouth 2 (two) times daily.     Cholecalciferol (VITAMIN D3) 2000 UNITS TABS Take 2,000 Units by mouth daily.     desonide (DESOWEN) 0.05 % lotion Apply 1 application topically daily as needed (irritation).     diclofenac Sodium (VOLTAREN) 1 % GEL Apply 2-4 grams to affected joint 4 times daily as needed. 400 g 2    Estradiol (VAGIFEM) 10 MCG TABS vaginal tablet Place 1 tablet (10 mcg total) vaginally 2 (two) times a week. Place one tablet (10 mcg) per vagina at bedtime twice a week. 24 tablet 3   famotidine (PEPCID) 20 MG tablet Take 1 tablet (20 mg total) by mouth 2 (two) times daily. 180 tablet 3   ibuprofen (ADVIL) 200 MG tablet Take 200-400 mg by mouth every 8 (eight) hours as needed (pain).     Magnesium Malate 1250 (141.7 Mg) MG TABS Take 3 tablets  by mouth daily.      Multiple Vitamin (MULTI-VITAMINS) TABS Take 1 tablet by mouth daily.     Omega-3 1000 MG CAPS Take 1,000 mg by mouth daily.     Omeprazole-Sodium Bicarbonate (ZEGERID) 20-1100 MG CAPS capsule      ORENCIA 125 MG/ML SOSY INJECT THE CONTENTS OF 1 SYRINGE UNDER THE SKIN EVERY WEEK 4 mL 2   Propylene Glycol (SYSTANE BALANCE) 0.6 % SOLN Place 1 drop into both eyes daily as needed (dry eyes).     Zinc 50 MG TABS Take 50 mg by mouth daily.     zolpidem (AMBIEN) 5 MG tablet Take 0.5 tablets (2.5 mg total) by mouth at bedtime as needed for sleep. 15 tablet 0   No current facility-administered medications on file prior to visit.   Review of Systems  Constitutional:  Positive for malaise/fatigue. Negative for chills and fever.  HENT:  Positive for sinus pain. Negative for congestion, ear pain and sore throat.   Eyes:  Negative for blurred vision, discharge and redness.  Respiratory:  Negative for cough, shortness of breath and stridor.   Cardiovascular:  Negative for chest pain, palpitations and leg swelling.  Gastrointestinal:  Negative for abdominal pain, diarrhea, nausea and vomiting.  Musculoskeletal:  Negative for myalgias.  Skin:  Negative for rash.  Neurological:  Positive for headaches. Negative for dizziness.     Observations/Objective: Pt sounds well Not in distress Nl cognition/good historian  No audible cough or sob Pt notes her cheek is sore under L eye with self palpation  Nl mood   Assessment and Plan:   Follow Up  Instructions: Problem List Items Addressed This Visit       Respiratory   Acute sinusitis - Primary    tx with augmentin -has tolerated well  Maxillary/ L side with tenderness (cleared by dentist is not a dental problem) Fluids /rest  Nasal saline if needed  Follow up if not improving or worse  ER precautions rev        Relevant Medications   amoxicillin-clavulanate (AUGMENTIN) 875-125 MG tablet      I discussed the assessment and treatment plan with the patient. The patient was provided an opportunity to ask questions and all were answered. The patient agreed with the plan and demonstrated an understanding of the instructions.   The patient was advised to call back or seek an in-person evaluation if the symptoms worsen or if the condition fails to improve as anticipated.  I provided 17 minutes of non-face-to-face time during this encounter.   Loura Pardon, MD

## 2021-09-13 NOTE — Progress Notes (Deleted)
Office Visit Note  Patient: PHOENICIA PIRIE             Date of Birth: [birth year]           MRN: [id number]             PCP: Abner Greenspan, MD Referring: Tower, [name], MD Visit Date: 09/27/2021 Occupation: '@GUAROCC'$ @  Subjective:  No chief complaint on file.   History of Present Illness: [name] is a 76 y.o. female ***   Activities of Daily Living:  Patient reports morning stiffness for *** {minute/hour:19697}.   Patient {ACTIONS;DENIES/REPORTS:21021675::"Denies"} nocturnal pain.  Difficulty dressing/grooming: {ACTIONS;DENIES/REPORTS:21021675::"Denies"} Difficulty climbing stairs: {ACTIONS;DENIES/REPORTS:21021675::"Denies"} Difficulty getting out of chair: {ACTIONS;DENIES/REPORTS:21021675::"Denies"} Difficulty using hands for taps, buttons, cutlery, and/or writing: {ACTIONS;DENIES/REPORTS:21021675::"Denies"}  No Rheumatology ROS completed.   PMFS History:  Patient Active Problem List   Diagnosis Date Noted  . Psoriasis 07/19/2021  . Sweating increase 07/19/2021  . Contact with and (suspected) exposure to covid-19 03/14/2021  . Sore throat 03/14/2021  . Tinnitus 11/09/2020  . Preoperative respiratory examination 06/07/2020  . ILD (interstitial lung disease) ([place])   . Hearing loss 07/10/2019  . Allergic rhinitis 04/08/2018  . Immunocompromised state ([place]) 02/17/2018  . 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 Primary osteoarthritis of both hands 01/11/2016  . 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/11/2016  . High risk medication use 11/15/2015  . Sjogren's syndrome ([place]) 10/31/2015  . Chronic constipation 10/23/2015  . Rosacea 10/23/2015  . Rheumatoid arthritis ([place]) 03/29/2015  . Coronary artery calcification seen on CT scan 11/17/2013  . Encounter for Medicare annual wellness exam 01/31/2013  . Dyspnea on exertion 09/14/2012  . Nodule of right lung 09/14/2012  . Acute sinusitis 02/12/2011  . Hyperlipidemia  05/08/2009  . Tear film insufficiency 05/08/2009  . Asthma 05/08/2009  . GERD 05/08/2009  . MENOPAUSAL DISORDER 05/08/2009  . ARTHRITIS, RHEUMATOID 05/08/2009  . Osteoporosis 05/08/2009  . SPONDYLOLISTHESIS 05/08/2009  . Sleep apnea 05/08/2009    Past Medical History:  Diagnosis Date  . Asthma   . Constipation   . Fibroid   . GERD (gastroesophageal reflux disease)   . Insomnia   . Interstitial lung disease ([place])   . Lumbar vertebral fracture ([facility])    L5-S1  . Menopausal symptoms   . Osteoporosis   . Postmenopausal HRT (hormone replacement therapy)   . RA (rheumatoid arthritis) ([place]) 11/2004   methotrexate and Embrel  . Rosacea   . Sjogren's disease ([place])   . Sleep apnea 2008   C-Pap  . Vitamin D deficiency     Family History  Problem Relation Age of Onset  . Breast cancer Sister 40  . Thyroid disease Sister   . Cancer Sister 21       Leukemia--CLL  . Heart disease Brother    Past Surgical History:  Procedure Laterality Date  . ABDOMINAL HYSTERECTOMY  01/1993   partial with AP repair, secondary to fibroids  . BREAST SURGERY  1991   breast biopsy  . BRONCHIAL WASHINGS  06/07/2020   Procedure: BRONCHIAL WASHINGS;  Surgeon: Brand Males, MD;  Location: [facility];  Service: Endoscopy;;  . CYSTOCELE REPAIR  10/2007   with graft  . MELANOMA EXCISION Right 01/2012   excision right posterior arm  . NASAL SINUS SURGERY  07/1991  . VAGINAL DELIVERY     x3  . VIDEO BRONCHOSCOPY N/A 06/07/2020   Procedure: VIDEO BRONCHOSCOPY WITHOUT FLUORO;  Surgeon: Brand Males, MD;  Location:  [facility];  Service: Endoscopy;  Laterality: N/A;   Social History   Social History Narrative  . Not on file   Immunization History  Administered Date(s) Administered  . DTaP 04/06/2013  . Influenza Split 10/22/2010  . Influenza Whole 10/10/2009, 10/20/2011  . Influenza, High Dose Seasonal PF 10/14/2016, 10/08/2018, 10/07/2019  . Influenza, Seasonal, Injecte, Preservative Fre  10/06/2012  . Influenza,inj,Quad PF,6+ Mos 10/30/2014  . Influenza,inj,quad, With Preservative 10/12/2017  . Influenza-Unspecified 10/26/2013, 10/22/2015, 11/02/2020  . PFIZER(Purple Top)SARS-COV-2 Vaccination 06/02/2019, 08/26/2019  . Pneumococcal Conjugate-13 02/13/2014  . Pneumococcal Polysaccharide-23 06/12/2010, 03/28/2015  . Td 02/07/2003, 03/23/2013  . Tdap 03/23/2013  . Zoster, Live 05/08/2014     Objective: Vital Signs: LMP 01/06/1993    Physical Exam   Musculoskeletal Exam: ***  CDAI Exam: CDAI Score: -- Patient Global: --; Provider Global: -- Swollen: --; Tender: -- Joint Exam 09/27/2021   No joint exam has been documented for this visit   There is currently no information documented on the homunculus. Go to the Rheumatology activity and complete the homunculus joint exam.  Investigation: No additional findings.  Imaging: No results found.  Recent Labs: Lab Results  Component Value Date   WBC 5.0 07/03/2021   HGB 14.9 07/03/2021   PLT 206.0 07/03/2021   NA 140 07/03/2021   K 4.1 07/03/2021   CL 106 07/03/2021   CO2 26 07/03/2021   GLUCOSE 94 07/03/2021   BUN 11 07/03/2021   CREATININE 0.70 07/03/2021   BILITOT 0.6 07/03/2021   ALKPHOS 70 07/03/2021   AST 23 07/03/2021   ALT 19 07/03/2021   PROT 6.0 07/03/2021   ALBUMIN 3.8 07/03/2021   CALCIUM 8.5 07/03/2021   GFRAA 100 06/22/2020   QFTBGOLDPLUS NEGATIVE 04/08/2021    Speciality Comments: Prior therapy: Plaquenil (allergy), Humira and Enbrel (inadequate response)  Reclast: 06/19/16, 06/29/17, 07/08/2018, 07/20/19, 08/08/20 DEXA 02/07/19  Procedures:  No procedures performed Allergies: Adalimumab, Diclofenac, Dicyclomine, Doxycycline hyclate, Enbrel [etanercept], Fosamax [alendronate sodium], Methotrexate derivatives, Nickel, Piroxicam, Salagen [pilocarpine hcl], Sulfamethoxazole-trimethoprim, Tramadol hcl, and Plaquenil [hydroxychloroquine]   Assessment / Plan:     Visit Diagnoses: Rheumatoid  arthritis involving multiple sites with positive rheumatoid factor ([place])  High risk medication use  Sicca syndrome (HCC)  ILD (interstitial lung disease) ([place])  Primary osteoarthritis of both hands  Primary osteoarthritis of both knees  Primary osteoarthritis of both feet  DDD (degenerative disc disease), lumbar  Age-related osteoporosis without current pathological fracture  Abnormal SPEP  History of gastroesophageal reflux (GERD)  History of rosacea  History of hyperlipidemia  History of sleep apnea  Orders: No orders of the defined types were placed in this encounter.  No orders of the defined types were placed in this encounter.   Face-to-face time spent with patient was *** minutes. Greater than 50% of time was spent in counseling and coordination of care.  Follow-Up Instructions: No follow-ups on file.   Ofilia Neas, PA-C  Note - This record has been created using Dragon software.  Chart creation errors have been sought, but may not always  have been located. Such creation errors do not reflect on  the standard of medical care.

## 2021-09-16 ENCOUNTER — Other Ambulatory Visit: Payer: Self-pay | Admitting: *Deleted

## 2021-09-16 DIAGNOSIS — Z79899 Other long term (current) drug therapy: Secondary | ICD-10-CM

## 2021-09-16 NOTE — Progress Notes (Signed)
Office Visit Note  Patient: Deanna Norris             Date of Birth: Oct 31, 1945           MRN: [id number]             PCP: Deanna Greenspan, MD Referring: Tower, [name], MD Visit Date: 09/25/2021 Occupation: '@GUAROCC'$ @  Subjective:  Medication monitoring   History of Present Illness: [name] is a 76 y.o. female with history of seropositive rheumatoid arthritis, ILD, osteoporosis, and osteoarthritis.  Patient remains on Orencia 125 mg sq injections every 7 days and Imuran 100 mg daily.  She continues to tolerate Orencia and Imuran without any side effects and has not had any injection site reactions with Orencia.  She continues to find combination therapy to be effective at managing her rheumatoid arthritis.  She denies any recent rheumatoid arthritis flares.  She denies any increased joint stiffness or joint swelling.  She is due for her Orencia dose today.  She states that she has been under the care of dermatology due to small patches of psoriasis scattered on her upper and lower extremities.  She has been using triamcinolone cream topically as needed to alleviate the scaling and itch. She denies any new or worsening pulmonary symptoms.  She states she is due to follow-up with Deanna Norris but is considering establishing care with a pulmonologist in [place] where she has moved.  She will also be establishing care with a new rheumatologist in [facility] Deanna Norris.  She requested a referral to be placed today.  In the meantime she would like to remain under our care. For dry mouth she has been using sugar-free lotions and for dry eyes she continues to use Systane eyedrops.  Patient reports that she will be having a dental bridge placed in October 2023.  She is unsure the timing of when she will be able to initiate Prolia and is also unsure if she would like her future rheumatologist for [place] to take over the management of Prolia. She denies any recent or recurrent infections.         Activities of Daily Living:  Patient reports morning stiffness for 5 minutes.   Patient Reports nocturnal pain.  Difficulty dressing/grooming: Denies Difficulty climbing stairs: Reports Difficulty getting out of chair: Denies Difficulty using hands for taps, buttons, cutlery, and/or writing: Denies  Review of Systems  Constitutional:  Positive for fatigue.  HENT:  Positive for mouth dryness. Negative for mouth sores.   Eyes:  Positive for dryness.  Respiratory:  Positive for shortness of breath.   Cardiovascular:  Negative for chest pain and palpitations.  Gastrointestinal:  Positive for constipation. Negative for blood in stool and diarrhea.  Endocrine: Negative for increased urination.  Genitourinary:  Negative for involuntary urination.  Musculoskeletal:  Positive for joint pain, joint pain, myalgias, muscle weakness, morning stiffness and myalgias. Negative for gait problem, joint swelling and muscle tenderness.  Skin:  Positive for rash. Negative for color change, hair loss and sensitivity to sunlight.  Allergic/Immunologic: Positive for susceptible to infections.  Neurological:  Negative for dizziness and headaches.  Hematological:  Negative for swollen glands.  Psychiatric/Behavioral:  Positive for sleep disturbance. Negative for depressed mood. The patient is not nervous/anxious.     PMFS History:  Patient Active Problem List   Diagnosis Date Noted  . Psoriasis 07/19/2021  . Sweating increase 07/19/2021  . Contact with and (suspected) exposure to covid-19 03/14/2021  . Sore  throat 03/14/2021  . Tinnitus 11/09/2020  . Preoperative respiratory examination 06/07/2020  . ILD (interstitial lung disease) ([place])   . Hearing loss 07/10/2019  . Allergic rhinitis 04/08/2018  . Immunocompromised state ([place]) 02/17/2018  . 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 Primary osteoarthritis of both hands 01/11/2016  . Primary  osteoarthritis of both knees 01/11/2016  . High risk medication use 11/15/2015  . Sjogren's syndrome ([place]) 10/31/2015  . Chronic constipation 10/23/2015  . Rosacea 10/23/2015  . Rheumatoid arthritis ([place]) 03/29/2015  . Coronary artery calcification seen on CT scan 11/17/2013  . Encounter for Medicare annual wellness exam 01/31/2013  . Dyspnea on exertion 09/14/2012  . Nodule of right lung 09/14/2012  . Acute sinusitis 02/12/2011  . Hyperlipidemia 05/08/2009  . Tear film insufficiency 05/08/2009  . Asthma 05/08/2009  . GERD 05/08/2009  . MENOPAUSAL DISORDER 05/08/2009  . ARTHRITIS, RHEUMATOID 05/08/2009  . Osteoporosis 05/08/2009  . SPONDYLOLISTHESIS 05/08/2009  . Sleep apnea 05/08/2009    Past Medical History:  Diagnosis Date  . Asthma   . Constipation   . Fibroid   . GERD (gastroesophageal reflux disease)   . Insomnia   . Interstitial lung disease ([place])   . Lumbar vertebral fracture (HCC)    L5-S1  . Menopausal symptoms   . Osteoporosis   . Postmenopausal HRT (hormone replacement therapy)   . Psoriasis   . RA (rheumatoid arthritis) ([place]) 11/2004   methotrexate and Embrel  . Rosacea   . Sjogren's disease ([place])   . Sleep apnea 2008   C-Pap  . Vitamin D deficiency     Family History  Problem Relation Age of Onset  . Breast cancer Sister 31  . Thyroid disease Sister   . Cancer Sister 34       Leukemia--CLL  . Heart disease Brother    Past Surgical History:  Procedure Laterality Date  . ABDOMINAL HYSTERECTOMY  01/06/1993   partial with AP repair, secondary to fibroids  . BREAST SURGERY  01/06/1989   breast biopsy  . BRONCHIAL WASHINGS  06/07/2020   Procedure: BRONCHIAL WASHINGS;  Surgeon: Deanna Males, MD;  Location: [facility];  Service: Endoscopy;;  . CYSTOCELE REPAIR  10/07/2007   with graft  . MELANOMA EXCISION Right 01/07/2012   excision right posterior arm  . MOUTH SURGERY  2022   oral surgery  . NASAL SINUS SURGERY  07/07/1991  . VAGINAL  DELIVERY     x3  . VIDEO BRONCHOSCOPY N/A 06/07/2020   Procedure: VIDEO BRONCHOSCOPY WITHOUT FLUORO;  Surgeon: Deanna Males, MD;  Location: [facility];  Service: Endoscopy;  Laterality: N/A;   Social History   Social History Narrative  . Not on file   Immunization History  Administered Date(s) Administered  . DTaP 04/06/2013  . Influenza Split 10/22/2010  . Influenza Whole 10/10/2009, 10/20/2011  . Influenza, High Dose Seasonal PF 10/14/2016, 10/08/2018, 10/07/2019  . Influenza, Seasonal, Injecte, Preservative Fre 10/06/2012  . Influenza,inj,Quad PF,6+ Mos 10/30/2014  . Influenza,inj,quad, With Preservative 10/12/2017  . Influenza-Unspecified 10/26/2013, 10/22/2015, 11/02/2020  . PFIZER(Purple Top)SARS-COV-2 Vaccination 06/02/2019, 08/26/2019  . Pneumococcal Conjugate-13 02/13/2014  . Pneumococcal Polysaccharide-23 06/12/2010, 03/28/2015  . Td 02/07/2003, 03/23/2013  . Tdap 03/23/2013  . Zoster, Live 05/08/2014     Objective: Vital Signs: BP (!) 144/78 (BP Location: Left Arm, Patient Position: Sitting, Cuff Size: Normal)   Pulse 60   Resp 15   Ht '5\' 6"'$  (1.676 m)   Wt 173 lb (78.5 kg)  LMP 01/06/1993   BMI 27.92 kg/m    Physical Exam Vitals and nursing note reviewed.  Constitutional:      Appearance: She is well-developed.  HENT:     Head: Normocephalic and atraumatic.  Eyes:     Conjunctiva/sclera: Conjunctivae normal.  Cardiovascular:     Rate and Rhythm: Normal rate and regular rhythm.     Heart sounds: Normal heart sounds.  Pulmonary:     Effort: Pulmonary effort is normal.     Breath sounds: Normal breath sounds.  Abdominal:     General: Bowel sounds are normal.     Palpations: Abdomen is soft.  Musculoskeletal:     Cervical back: Normal range of motion.  Skin:    General: Skin is warm and dry.     Capillary Refill: Capillary refill takes less than 2 seconds.  Neurological:     Mental Status: She is alert and oriented to person, place, and  time.  Psychiatric:        Behavior: Behavior normal.     Musculoskeletal Exam: C-spine, thoracic spine, lumbar spine have good range of motion.  Shoulder joints, elbow joints, wrist joints, MCPs, PIPs, DIPs have good range of motion with no synovitis.  Complete fist formation bilaterally.  Synovial thickening of MCP joints but no tenderness or synovitis.  PIP and DIP thickening consistent with osteoarthritic changes.  Hip joints have good range of motion with no groin pain.  Knee joints have good range of motion with no warmth or effusion.  Ankle joints have good range of motion with no tenderness or synovitis.  PIP and DIP thickening consistent with osteoarthritis of both feet.  Dorsal spurs noted bilaterally.  CDAI Exam: CDAI Score: 0.4  Patient Global: 2 mm; Provider Global: 2 mm Swollen: 0 ; Tender: 0  Joint Exam 09/25/2021   No joint exam has been documented for this visit   There is currently no information documented on the homunculus. Go to the Rheumatology activity and complete the homunculus joint exam.  Investigation: No additional findings.  Imaging: No results found.  Recent Labs: Lab Results  Component Value Date   WBC 7.8 09/16/2021   HGB 15.8 (H) 09/16/2021   PLT 248 09/16/2021   NA 141 09/16/2021   K 4.2 09/16/2021   CL 105 09/16/2021   CO2 25 09/16/2021   GLUCOSE 99 09/16/2021   BUN 11 09/16/2021   CREATININE 0.66 09/16/2021   BILITOT 0.3 09/16/2021   ALKPHOS 70 07/03/2021   AST 20 09/16/2021   ALT 16 09/16/2021   PROT 6.6 09/16/2021   ALBUMIN 3.8 07/03/2021   CALCIUM 9.8 09/16/2021   GFRAA 100 06/22/2020   QFTBGOLDPLUS NEGATIVE 04/08/2021    Speciality Comments: Prior therapy: Plaquenil (allergy), Humira and Enbrel (inadequate response)  Reclast: 06/19/16, 06/29/17, 07/08/2018, 07/20/19, 08/08/20 DEXA 02/07/19  Procedures:  No procedures performed Allergies: Adalimumab, Diclofenac, Dicyclomine, Doxycycline hyclate, Enbrel [etanercept], Fosamax  [alendronate sodium], Methotrexate derivatives, Nickel, Piroxicam, Salagen [pilocarpine hcl], Sulfamethoxazole-trimethoprim, Tramadol hcl, and Plaquenil [hydroxychloroquine]      Assessment / Plan:     Visit Diagnoses: Rheumatoid arthritis involving multiple sites with positive rheumatoid factor (HCC) - +RF, +CCP, ANA-,severe erosive: She has no joint tenderness or synovitis on examination today.  She has not had any recent rheumatoid arthritis flares.  She has synovial thickening over MCP joints but no active inflammation noted.  Overall her rheumatoid arthritis remains well controlled on Orencia 125 mg sq injections every week and Imuran 100 mg daily.  She  is tolerating both medications without any side effects or injection site reactions with Orencia.  She has not had any recent or recurrent infections on combination therapy. The patient has moved to [place], [place] and plans on establishing care with a new rheumatologist and likely a new pulmonologist.  A referral to Deanna Norris was placed today to establish care.  In the meantime she will remain on Orencia and Imuran as prescribed by [place].  She was advised to notify us if she develops signs or symptoms of a flare.  She will follow up in 5 months.  - Plan: Ambulatory referral to Rheumatology  High risk medication use - Orencia 125 mg sq injections every 7 days and Imuran 100 mg daily. (previously on MTX and folic acid).  CBC and CMP updated on 09/16/2021.  Results were reviewed with the patient today in the office.  Patient will be due to update lab work in December and every 3 months to monitor for drug toxicity.  Standing orders for CBC and CMP remain in place. TB Gold negative on 04/08/2021.  TB Gold will be monitored yearly. She has not had any recent or recurrent infections.  Discussed the importance of holding Orencia and Imuran if she develops signs or symptoms of an infection and to resume once the infection is completely cleared. Planning on annual  flu shot in October 2023.  She is up to date on prevnar and pneumovax.  She does not plan on getting the COVID booster at this time. Encouraged to get Shingrix vaccination series in the future.  Sicca syndrome [facility]): She continues to have chronic sicca symptoms which have been tolerable.  She has been using Systane eyedrops and sugar-free lotions on a daily basis for symptomatic relief.  Discussed the importance of seeing her ophthalmologist on a yearly basis as well as her dentist every 6 months.  ILD (interstitial lung disease) ([place]) - Evaluated by Deanna Norris and diagnosed with mild ILD.  Diagnosed with ILD in 2020. Updated high-resolution chest CT on 01/18/2021:  Very mild bibasilar predominant irregular peripheral interstitial opacity and septal thickening without clear evidence of subpleural bronchiectasis or honeycombing.  No significant change compared to prior exam on 03/05/2020.  Chest x-ray 03/17/2021: Mild chronic interstitial opacities.  Completed pulmonary rehab.  She has been trying to walk on a regular basis for exercise. Mild disease burden-does not require antifibrotic therapy at this time.   Primary osteoarthritis of both hands: She has PIP and DIP thickening consistent with osteoarthritis of both hands.  No tenderness or inflammation noted.  Complete fist formation bilaterally.  Discussed the importance of joint protection and muscle strengthening.  Primary osteoarthritis of both knees - X-rays of both knees were updated on 03/28/2019 which were consistent with moderate to severe osteoarthritis and severe chondromalacia patella.  She underwent Visco gel injections in April 2023 which provided significant relief.  On examination today she has no warmth or effusion.  She has been walking several days a week for exercise.  She will notify us when and if she should like [place] to reapply for visco gel injections for her knees.   Primary osteoarthritis of both feet: She is not experiencing  any discomfort in her feet at this time.  Dorsal spurs noted bilaterally.  Both ankle joints have good range of motion with no tenderness or synovitis.  DDD (degenerative disc disease), lumbar: She is not experiencing any increased discomfort in her lower back at this time.  She has no midline spinal  tenderness or symptoms of radiculopathy.  Age-related osteoporosis without current pathological fracture - DEXA updated on 02/26/2021: RFN BMD 0.490 with T score -3.2.  Statistically significant decrease in BMD of the AP spine and right hip.  Statistically significant increase in BMD of the left hip.  -13% change in BMD of the AP spine since DEXA on 02/07/19 (LFN 2.9 at that time).  Previous therapy: Reclast on a yearly basis from 2018-2022. Last Reclast IV infusion was in August 2022.   Plan was to proceed with Prolia.  Patient will be undergoing a dental bridge placement in October 2023.  She will require clearance by her dentist prior to initiating Prolia. Vitamin D 68.98 on 07/03/21. She is taking a calcium and vitamin D supplement daily.    Other medical conditions are listed as follows:   Abnormal SPEP  History of gastroesophageal reflux (GERD)  History of rosacea  History of hyperlipidemia  History of sleep apnea - She uses a CPAP nightly.    Orders: Orders Placed This Encounter  Procedures  . Ambulatory referral to Rheumatology   Meds ordered this encounter  Medications  . DISCONTD: zolpidem (AMBIEN) 5 MG tablet    Sig: Take 0.5 tablets (2.5 mg total) by mouth at bedtime as needed for sleep.    Dispense:  15 tablet    Refill:  0  . zolpidem (AMBIEN) 5 MG tablet    Sig: Take 0.5 tablets (2.5 mg total) by mouth at bedtime as needed for sleep.    Dispense:  15 tablet    Refill:  0     Follow-Up Instructions: Return in about 5 months (around 02/25/2022) for Rheumatoid arthritis, ILD, Osteoporosis, Osteoarthritis.   Ofilia Neas, PA-C  Note - This record has been created using  Dragon software.  Chart creation errors have been sought, but may not always  have been located. Such creation errors do not reflect on  the standard of medical care.

## 2021-09-17 LAB — CBC WITH DIFFERENTIAL/PLATELET
Absolute Monocytes: 842 cells/uL (ref 200–950)
Basophils Absolute: 109 cells/uL (ref 0–200)
Basophils Relative: 1.4 %
Eosinophils Absolute: 608 cells/uL — ABNORMAL HIGH (ref 15–500)
Eosinophils Relative: 7.8 %
HCT: 45.7 % — ABNORMAL HIGH (ref 35.0–45.0)
Hemoglobin: 15.8 g/dL — ABNORMAL HIGH (ref 11.7–15.5)
Lymphs Abs: 2215 cells/uL (ref 850–3900)
MCH: 33.8 pg — ABNORMAL HIGH (ref 27.0–33.0)
MCHC: 34.6 g/dL (ref 32.0–36.0)
MCV: 97.6 fL (ref 80.0–100.0)
MPV: 10.6 fL (ref 7.5–12.5)
Monocytes Relative: 10.8 %
Neutro Abs: 4025 cells/uL (ref 1500–7800)
Neutrophils Relative %: 51.6 %
Platelets: 248 10*3/uL (ref 140–400)
RBC: 4.68 10*6/uL (ref 3.80–5.10)
RDW: 13.7 % (ref 11.0–15.0)
Total Lymphocyte: 28.4 %
WBC: 7.8 10*3/uL (ref 3.8–10.8)

## 2021-09-17 LAB — COMPLETE METABOLIC PANEL WITH GFR
AG Ratio: 1.5 (calc) (ref 1.0–2.5)
ALT: 16 U/L (ref 6–29)
AST: 20 U/L (ref 10–35)
Albumin: 4 g/dL (ref 3.6–5.1)
Alkaline phosphatase (APISO): 78 U/L (ref 37–153)
BUN: 11 mg/dL (ref 7–25)
CO2: 25 mmol/L (ref 20–32)
Calcium: 9.8 mg/dL (ref 8.6–10.4)
Chloride: 105 mmol/L (ref 98–110)
Creat: 0.66 mg/dL (ref 0.60–1.00)
Globulin: 2.6 g/dL (calc) (ref 1.9–3.7)
Glucose, Bld: 99 mg/dL (ref 65–139)
Potassium: 4.2 mmol/L (ref 3.5–5.3)
Sodium: 141 mmol/L (ref 135–146)
Total Bilirubin: 0.3 mg/dL (ref 0.2–1.2)
Total Protein: 6.6 g/dL (ref 6.1–8.1)
eGFR: 91 mL/min/{1.73_m2} (ref >=60)

## 2021-09-17 NOTE — Progress Notes (Signed)
CMP WNL. CBC stable.

## 2021-09-25 ENCOUNTER — Ambulatory Visit: Payer: Medicare Other | Attending: Rheumatology | Admitting: Physician Assistant

## 2021-09-25 ENCOUNTER — Encounter: Payer: Self-pay | Admitting: Physician Assistant

## 2021-09-25 VITALS — BP 144/78 | HR 60 | Resp 15 | Ht 66.0 in | Wt 173.0 lb

## 2021-09-25 DIAGNOSIS — J849 Interstitial pulmonary disease, unspecified: Secondary | ICD-10-CM | POA: Diagnosis present

## 2021-09-25 DIAGNOSIS — Z8639 Personal history of other endocrine, nutritional and metabolic disease: Secondary | ICD-10-CM | POA: Diagnosis present

## 2021-09-25 DIAGNOSIS — Z8719 Personal history of other diseases of the digestive system: Secondary | ICD-10-CM | POA: Diagnosis present

## 2021-09-25 DIAGNOSIS — M170 Bilateral primary osteoarthritis of knee: Secondary | ICD-10-CM

## 2021-09-25 DIAGNOSIS — M0579 Rheumatoid arthritis with rheumatoid factor of multiple sites without organ or systems involvement: Secondary | ICD-10-CM | POA: Diagnosis present

## 2021-09-25 DIAGNOSIS — M19071 Primary osteoarthritis, right ankle and foot: Secondary | ICD-10-CM | POA: Diagnosis present

## 2021-09-25 DIAGNOSIS — M810 Age-related osteoporosis without current pathological fracture: Secondary | ICD-10-CM | POA: Diagnosis present

## 2021-09-25 DIAGNOSIS — M5136 Other intervertebral disc degeneration, lumbar region: Secondary | ICD-10-CM | POA: Diagnosis present

## 2021-09-25 DIAGNOSIS — Z872 Personal history of diseases of the skin and subcutaneous tissue: Secondary | ICD-10-CM | POA: Diagnosis present

## 2021-09-25 DIAGNOSIS — Z8669 Personal history of other diseases of the nervous system and sense organs: Secondary | ICD-10-CM

## 2021-09-25 DIAGNOSIS — M19072 Primary osteoarthritis, left ankle and foot: Secondary | ICD-10-CM

## 2021-09-25 DIAGNOSIS — M3500 Sicca syndrome, unspecified: Secondary | ICD-10-CM | POA: Diagnosis present

## 2021-09-25 DIAGNOSIS — R778 Other specified abnormalities of plasma proteins: Secondary | ICD-10-CM | POA: Diagnosis present

## 2021-09-25 DIAGNOSIS — M19042 Primary osteoarthritis, left hand: Secondary | ICD-10-CM

## 2021-09-25 DIAGNOSIS — M19041 Primary osteoarthritis, right hand: Secondary | ICD-10-CM | POA: Diagnosis present

## 2021-09-25 DIAGNOSIS — Z79899 Other long term (current) drug therapy: Secondary | ICD-10-CM | POA: Diagnosis present

## 2021-09-25 MED ORDER — ZOLPIDEM TARTRATE 5 MG PO TABS
2.5000 mg | ORAL_TABLET | Freq: Every evening | ORAL | 0 refills | Status: DC | PRN
Start: 2021-09-25 — End: 2021-09-25

## 2021-09-25 MED ORDER — ZOLPIDEM TARTRATE 5 MG PO TABS: 2.5000 mg | ORAL_TABLET | Freq: Every evening | ORAL | 0 refills | Status: DC | PRN

## 2021-09-25 NOTE — Patient Instructions (Signed)
Standing Labs We placed an order today for your standing lab work.   Please have your standing labs drawn in December and every 3 months   If possible, please have your labs drawn 2 weeks prior to your appointment so that the provider can discuss your results at your appointment.  Please note that you may see your imaging and lab results in [place] before we have reviewed them. We may be awaiting multiple results to interpret others before contacting you. Please allow our office up to 72 hours to thoroughly review all of the results before contacting the office for clarification of your results.  We currently have open lab daily: Monday through Thursday from 1:30 PM-4:30 PM and Friday from 1:30 PM- 4:00 PM If possible, please come for your lab work on Monday, Thursday or Friday afternoons, as you may experience shorter wait times.   Effective November 06, 2021 the new lab hours will change to: Monday through Thursday from 1:30 PM-5:00 PM and Friday from 8:30 AM-12:00 PM If possible, please come for your lab work on Monday and Thursday afternoons, as you may experience shorter wait times.  Please be advised, all patients with office appointments requiring lab work will take precedent over walk-in lab work.    The office is located at [address] [place], [place], [place] [address] No appointment is necessary.   Labs are drawn by [facility]. Please bring your co-pay at the time of your lab draw.  You may receive a bill from [facility] for your lab work.  Please note if you are on Hydroxychloroquine and and an order has been placed for a Hydroxychloroquine level, you will need to have it drawn 4 hours or more after your last dose.  If you wish to have your labs drawn at another location, please call the office 24 hours in advance to send orders.  If you have any questions regarding directions or hours of operation,  please call [phone].   As a reminder, please drink plenty of water prior  to coming for your lab work. Thanks!

## 2021-09-27 ENCOUNTER — Ambulatory Visit: Payer: Medicare Other | Admitting: Rheumatology

## 2021-09-27 DIAGNOSIS — Z872 Personal history of diseases of the skin and subcutaneous tissue: Secondary | ICD-10-CM

## 2021-09-27 DIAGNOSIS — M0579 Rheumatoid arthritis with rheumatoid factor of multiple sites without organ or systems involvement: Secondary | ICD-10-CM

## 2021-09-27 DIAGNOSIS — Z79899 Other long term (current) drug therapy: Secondary | ICD-10-CM

## 2021-09-27 DIAGNOSIS — M3500 Sicca syndrome, unspecified: Secondary | ICD-10-CM

## 2021-09-27 DIAGNOSIS — R778 Other specified abnormalities of plasma proteins: Secondary | ICD-10-CM

## 2021-09-27 DIAGNOSIS — M5136 Other intervertebral disc degeneration, lumbar region: Secondary | ICD-10-CM

## 2021-09-27 DIAGNOSIS — M19041 Primary osteoarthritis, right hand: Secondary | ICD-10-CM

## 2021-09-27 DIAGNOSIS — J849 Interstitial pulmonary disease, unspecified: Secondary | ICD-10-CM

## 2021-09-27 DIAGNOSIS — M810 Age-related osteoporosis without current pathological fracture: Secondary | ICD-10-CM

## 2021-09-27 DIAGNOSIS — Z8669 Personal history of other diseases of the nervous system and sense organs: Secondary | ICD-10-CM

## 2021-09-27 DIAGNOSIS — M170 Bilateral primary osteoarthritis of knee: Secondary | ICD-10-CM

## 2021-09-27 DIAGNOSIS — Z8719 Personal history of other diseases of the digestive system: Secondary | ICD-10-CM

## 2021-09-27 DIAGNOSIS — Z8639 Personal history of other endocrine, nutritional and metabolic disease: Secondary | ICD-10-CM

## 2021-09-27 DIAGNOSIS — M19071 Primary osteoarthritis, right ankle and foot: Secondary | ICD-10-CM

## 2021-10-25 ENCOUNTER — Telehealth: Payer: Self-pay

## 2021-10-25 NOTE — Telephone Encounter (Signed)
Dr. Posey Pronto did accept patient, referral closed.

## 2021-10-25 NOTE — Telephone Encounter (Signed)
Patient called stating that she had talked to [facility] about having records sent to a Dr. Posey Pronto. Patient would like a call back.

## 2021-10-30 ENCOUNTER — Telehealth: Payer: Self-pay

## 2021-10-30 NOTE — Telephone Encounter (Signed)
Duplicate/error

## 2021-10-30 NOTE — Telephone Encounter (Signed)
Patient called stating that her records had been forwarded to a Dr. Payton Emerald office had previously said that they were not seeing new patients but since the patient's daughter is a patient of Dr. Posey Pronto he has agreed to see her. Patient states that she has also tried to get in to see a Dr. Clementeen Graham at [facility], so we might be getting a records request from him should Dr. Serita Grit office not accept her.

## 2021-11-06 ENCOUNTER — Other Ambulatory Visit: Payer: Self-pay | Admitting: Physician Assistant

## 2021-11-06 DIAGNOSIS — Z79899 Other long term (current) drug therapy: Secondary | ICD-10-CM

## 2021-11-06 NOTE — Telephone Encounter (Signed)
Next Visit: 03/05/2022  Last Visit: 09/25/2021  Last Fill: 09/25/2021  Current Dose per office note on 09/25/2021: not discussed  Okay to refill Ambien?

## 2021-11-06 NOTE — Telephone Encounter (Signed)
Next Visit: 03/05/2022  Last Visit: 09/25/2021  Last Fill: 08/14/2021  [id number] arthritis involving multiple sites with positive rheumatoid factor   Current Dose per office note 09/25/2021: Orencia 125 mg sq injections every 7 days and Imuran 100 mg daily.   Labs: 09/06/2021 CMP WNL. CBC stable.   TB Gold: 04/08/2021 Neg    Okay to refill Orencia and Imuran?

## 2021-12-03 ENCOUNTER — Ambulatory Visit
Admission: EM | Admit: 2021-12-03 | Discharge: 2021-12-03 | Disposition: A | Discharge status: Home / Self Care | Payer: Medicare Other | Attending: Emergency Medicine | Admitting: Emergency Medicine

## 2021-12-03 DIAGNOSIS — U071 COVID-19: Secondary | ICD-10-CM | POA: Diagnosis not present

## 2021-12-03 MED ORDER — PAXLOVID (150/100) 10 X 150 MG & 10 X 100MG PO TBPK: 3.0000 | ORAL_TABLET | Freq: Two times a day (BID) | ORAL | 0 refills | Status: AC

## 2021-12-03 NOTE — Discharge Instructions (Addendum)
Please see the enclosed information about COVID-19, I hope you find this helpful.  Absolutely recommend that you begin taking Paxlovid, the antiviral medication specifically for COVID-19.  With your history of interstitial lung disease, you are at high risk for hospitalization due to the side effects of COVID-19.  Paxlovid has been proven to reduce your risk.  Please seek emergency medical attention if you begin to feel significantly short of breath, becomes confused or the people around you notice that you are mentally altered.  Thank you for visiting urgent care today.

## 2021-12-03 NOTE — ED Provider Notes (Signed)
[facility]    CSN: [id number] Arrival date & time: 12/03/21  1354    HISTORY   Chief Complaint  Patient presents with   Cough   HPI Deanna Norris is a pleasant, 76 y.o. female who presents to urgent care today. Patient reports positive home COVID-19 test yesterday evening.  Patient states she has been taking ibuprofen for fever.  Patient also complains of cough and body aches for the past 2 days.  Patient has elevated blood pressure on arrival with O2 sat of 93%, vital signs are otherwise normal.  GFR was last checked on November 19, 2021, was greater than 60.  The history is provided by the patient.   Past Medical History:  Diagnosis Date   Asthma    Constipation    Fibroid    GERD (gastroesophageal reflux disease)    Insomnia    Interstitial lung disease (HCC)    Lumbar vertebral fracture (HCC)    L5-S1   Menopausal symptoms    Osteoporosis    Postmenopausal HRT (hormone replacement therapy)    Psoriasis    RA (rheumatoid arthritis) ([place]) 11/2004   methotrexate and Embrel   Rosacea    Sjogren's disease ([place])    Sleep apnea 2008   C-Pap   Vitamin D deficiency    Patient Active Problem List   Diagnosis Date Noted   Psoriasis 07/19/2021   Sweating increase 07/19/2021   Contact with and (suspected) exposure to covid-19 03/14/2021   Sore throat 03/14/2021   Tinnitus 11/09/2020   Preoperative respiratory examination 06/07/2020   ILD (interstitial lung disease) ([place])    Hearing loss 07/10/2019   Allergic rhinitis 04/08/2018   Immunocompromised state ([place]) 02/17/2018   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   Primary osteoarthritis of both hands 01/11/2016   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/11/2016   High risk medication use 11/15/2015   Sjogren's syndrome ([place]) 10/31/2015   Chronic constipation 10/23/2015   Rosacea 10/23/2015   Rheumatoid arthritis ([place]) 03/29/2015   Coronary artery calcification  seen on CT scan 11/17/2013   Encounter for Medicare annual wellness exam 01/31/2013   Dyspnea on exertion 09/14/2012   Nodule of right lung 09/14/2012   Acute sinusitis 02/12/2011   Hyperlipidemia 05/08/2009   Tear film insufficiency 05/08/2009   Asthma 05/08/2009   GERD 05/08/2009   MENOPAUSAL DISORDER 05/08/2009   ARTHRITIS, RHEUMATOID 05/08/2009   Osteoporosis 05/08/2009   SPONDYLOLISTHESIS 05/08/2009   Sleep apnea 05/08/2009   Past Surgical History:  Procedure Laterality Date   ABDOMINAL HYSTERECTOMY  01/06/1993   partial with AP repair, secondary to fibroids   BREAST SURGERY  01/06/1989   breast biopsy   BRONCHIAL WASHINGS  06/07/2020   Procedure: BRONCHIAL WASHINGS;  Surgeon: Brand Males, MD;  [facility];  Service: Endoscopy;;   CYSTOCELE REPAIR  10/07/2007   with graft   MELANOMA EXCISION Right 01/07/2012   excision right posterior arm   MOUTH SURGERY  2022   oral surgery   NASAL SINUS SURGERY  07/07/1991   VAGINAL DELIVERY     x3   VIDEO BRONCHOSCOPY N/A 06/07/2020   Procedure: VIDEO BRONCHOSCOPY WITHOUT FLUORO;  Surgeon: Brand Males, MD;  [facility];  Service: Endoscopy;  Laterality: N/A;   OB History     Gravida  3   Para  3   Term  3   Preterm  0   AB  0   Living  3  SAB  0   IAB  0   Ectopic  0   Multiple  0   Live Births  3          Home Medications    Prior to Admission medications   Medication Sig Start Date End Date Taking? Authorizing Provider  albuterol (PROAIR HFA) 108 (90 Base) MCG/ACT inhaler Inhale 2 puffs into the lungs every 4 (four) hours as needed for wheezing or shortness of breath. 01/06/19   Tower, [name], MD  Ascorbic Acid (VITAMIN C PO) Take 500 mg by mouth daily.    [provider]  aspirin 81 MG tablet Take 81 mg by mouth daily.    [provider]  azaTHIOprine (IMURAN) 50 MG tablet TAKE 2 TABLETS (100 MG) DAILY 11/06/21   Deveshwar, Abel Presto, MD   budesonide (PULMICORT) 180 MCG/ACT inhaler Inhale 1 puff into the lungs daily as needed (asthma).    [provider]  Calcium Carbonate-Vitamin D (CALCIUM 500/D PO) Take 1 tablet by mouth 2 (two) times daily.    [provider]  Cholecalciferol (VITAMIN D3) 2000 UNITS TABS Take 2,000 Units by mouth daily.    [provider]  desonide (DESOWEN) 0.05 % lotion Apply 1 application topically daily as needed (irritation). 01/14/16   [provider]  diclofenac Sodium (VOLTAREN) 1 % GEL Apply 2-4 grams to affected joint 4 times daily as needed. 04/22/19   Ofilia Neas, PA-C  Estradiol (VAGIFEM) 10 MCG TABS vaginal tablet Place 1 tablet (10 mcg total) vaginally 2 (two) times a week. Place one tablet (10 mcg) per vagina at bedtime twice a week. 04/04/21   Nunzio Cobbs, MD  famotidine (PEPCID) 20 MG tablet Take 1 tablet (20 mg total) by mouth 2 (two) times daily. 07/15/21   Venia Carbon, MD  ibuprofen (ADVIL) 200 MG tablet Take 200-400 mg by mouth every 8 (eight) hours as needed (pain).    [provider]  Magnesium Malate 1250 (141.7 Mg) MG TABS Take 3 tablets by mouth daily.     [provider]  Multiple Vitamin (MULTI-VITAMINS) TABS Take 1 tablet by mouth daily.    [provider]  Omega-3 1000 MG CAPS Take 1,000 mg by mouth daily.    [provider]  Omeprazole-Sodium Bicarbonate (ZEGERID) 20-1100 MG CAPS capsule as needed. 01/09/21   [provider]  ORENCIA 125 MG/ML SOSY INJECT THE CONTENTS 1 SYRINGE UNDER THE SKIN EVERY WEEK 11/06/21   Bo Merino, MD  Propylene Glycol (SYSTANE BALANCE) 0.6 % SOLN Place 1 drop into both eyes daily as needed (dry eyes).    [provider]  triamcinolone cream (KENALOG) 0.1 % as needed. 05/06/21   [provider]  Zinc 50 MG TABS Take 50 mg by mouth daily. 01/07/19   [provider]  zolpidem (AMBIEN) 5 MG tablet TAKE ONE-HALF TABLET BY MOUTH AT  BEDTIME AS NEEDED FOR SLEEP 11/06/21   Bo Merino, MD    Family History Family History  Problem Relation Age of Onset   Breast cancer Sister 39   Thyroid disease Sister    Cancer Sister 45       Leukemia--CLL   Heart disease Brother    Social History Social History   Tobacco Use   Smoking status: Never    Passive exposure: Never   Smokeless tobacco: Never  Vaping Use   Vaping Use: Never used  Substance Use Topics   Alcohol use: No  Alcohol/week: 0.0 standard drinks of alcohol   Drug use: No   Allergies   Adalimumab, Diclofenac, Dicyclomine, Doxycycline hyclate, Enbrel [etanercept], Fosamax [alendronate sodium], Methotrexate derivatives, Nickel, Piroxicam, Salagen [pilocarpine hcl], Sulfamethoxazole-trimethoprim, Tramadol hcl, and Plaquenil [hydroxychloroquine]  Review of Systems Review of Systems Pertinent findings revealed after performing a 14 point review of systems has been noted in the history of present illness.  Physical Exam Triage Vital Signs ED Triage Vitals  Enc Vitals Group     BP 11/02/20 0827 (!) 147/82     Pulse Rate 11/02/20 0827 72     Resp 11/02/20 0827 18     Temp 11/02/20 0827 98.3 F (36.8 C)     Temp Source 11/02/20 0827 Oral     SpO2 11/02/20 0827 98 %     Weight --      Height --      Head Circumference --      Peak Flow --      Pain Score 11/02/20 0826 5     Pain Loc --      Pain Edu? --      Excl. in [place]? --   No data found.  Updated Vital Signs BP (!) 154/70 (BP Location: Left Arm)   Pulse 67   Temp 98.3 F (36.8 C) (Oral)   Resp 16   LMP 01/06/1993   SpO2 93%   Physical Exam Vitals and nursing note reviewed.  Constitutional:      General: She is not in acute distress.    Appearance: Normal appearance. She is not ill-appearing.  HENT:     Head: Normocephalic and atraumatic.     Salivary Glands: Right salivary gland is not diffusely enlarged or tender. Left salivary gland is not diffusely enlarged or tender.      Right Ear: Tympanic membrane, ear canal and external ear normal. No drainage. No middle ear effusion. There is no impacted cerumen. Tympanic membrane is not erythematous or bulging.     Left Ear: Tympanic membrane, ear canal and external ear normal. No drainage.  No middle ear effusion. There is no impacted cerumen. Tympanic membrane is not erythematous or bulging.     Nose: Nose normal. No nasal deformity, septal deviation, mucosal edema, congestion or rhinorrhea.     Right Turbinates: Not enlarged, swollen or pale.     Left Turbinates: Not enlarged, swollen or pale.     Right Sinus: No maxillary sinus tenderness or frontal sinus tenderness.     Left Sinus: No maxillary sinus tenderness or frontal sinus tenderness.     Mouth/Throat:     Lips: Pink. No lesions.     Mouth: Mucous membranes are moist. No oral lesions.     Pharynx: Oropharynx is clear. Uvula midline. No posterior oropharyngeal erythema or uvula swelling.     Tonsils: No tonsillar exudate. 0 on the right. 0 on the left.  Eyes:     General: Lids are normal.        Right eye: No discharge.        Left eye: No discharge.     Extraocular Movements: Extraocular movements intact.     Conjunctiva/sclera: Conjunctivae normal.     Right eye: Right conjunctiva is not injected.     Left eye: Left conjunctiva is not injected.  Neck:     Trachea: Trachea and phonation normal.  Cardiovascular:     Rate and Rhythm: Normal rate and regular rhythm.     Pulses: Normal pulses.  Heart sounds: Normal heart sounds. No murmur heard.    No friction rub. No gallop.  Pulmonary:     Effort: Pulmonary effort is normal. No accessory muscle usage, prolonged expiration or respiratory distress.     Breath sounds: Normal breath sounds. No stridor, decreased air movement or transmitted upper airway sounds. No decreased breath sounds, wheezing, rhonchi or rales.  Chest:     Chest wall: No tenderness.  Musculoskeletal:        General: Normal range of  motion.     Cervical back: Normal range of motion and neck supple. Normal range of motion.  Lymphadenopathy:     Cervical: No cervical adenopathy.  Skin:    General: Skin is warm and dry.     Findings: No erythema or rash.  Neurological:     General: No focal deficit present.     Mental Status: She is alert and oriented to person, place, and time.  Psychiatric:        Mood and Affect: Mood normal.        Behavior: Behavior normal.     Visual Acuity Right Eye Distance:   Left Eye Distance:   Bilateral Distance:    Right Eye Near:   Left Eye Near:    Bilateral Near:     UC Couse / Diagnostics / Procedures:     Radiology No results found.  Procedures Procedures (including critical care time) EKG  Pending results:  Labs Reviewed - No data to display  Medications Ordered in UC: Medications - No data to display  UC Diagnoses / Final Clinical Impressions(s)   I have reviewed the triage vital signs and the nursing notes.  Pertinent labs & imaging results that were available during my care of the patient were reviewed by me and considered in my medical decision making (see chart for details).    Final diagnoses:  CNOBS-96   Patient provided with a prescription for Paxlovid, last GFR was greater than 60.  Patient education provided.  Emergency precautions advised.  ED Prescriptions     Medication Sig Dispense Auth. Provider   nirmatrelvir & ritonavir (PAXLOVID, 150/100,) 10 x 150 MG & 10 x '100MG'$  TBPK Take 3 tablets by mouth 2 (two) times daily for 5 days. 30 tablet Lynden Oxford Scales, PA-C      PDMP not reviewed this encounter.  Disposition Upon Discharge:  Condition: stable for discharge home Home: take medications as prescribed; routine discharge instructions as discussed; follow up as advised.  Patient presented with an acute illness with associated systemic symptoms and significant discomfort requiring urgent management. In my opinion, this is a condition  that a prudent lay person (someone who possesses an average knowledge of health and medicine) may potentially expect to result in complications if not addressed urgently such as respiratory distress, impairment of bodily function or dysfunction of bodily organs.   Routine symptom specific, illness specific and/or disease specific instructions were discussed with the patient and/or caregiver at length.   As such, the patient has been evaluated and assessed, work-up was performed and treatment was provided in alignment with urgent care protocols and evidence based medicine.  Patient/parent/caregiver has been advised that the patient may require follow up for further testing and treatment if the symptoms continue in spite of treatment, as clinically indicated and appropriate.  If the patient was tested for COVID-19, Influenza and/or RSV, then the patient/parent/guardian was advised to isolate at home pending the results of his/her diagnostic coronavirus test and potentially  longer if they're positive. I have also advised pt that if his/her COVID-19 test returns positive, it's recommended to self-isolate for at least 10 days after symptoms first appeared AND until fever-free for 24 hours without fever reducer AND other symptoms have improved or resolved. Discussed self-isolation recommendations as well as instructions for household member/close contacts as per the [facility] and [place] DHHS, and also gave patient the [place] packet with this information.  Patient/parent/caregiver has been advised to return to the [facility] or PCP in 3-5 days if no better; to PCP or the Emergency Department if Deanna signs and symptoms develop, or if the current signs or symptoms continue to change or worsen for further workup, evaluation and treatment as clinically indicated and appropriate  The patient will follow up with their current PCP if and as advised. If the patient does not currently have a PCP we will assist them in obtaining one.   The  patient may need specialty follow up if the symptoms continue, in spite of conservative treatment and management, for further workup, evaluation, consultation and treatment as clinically indicated and appropriate.  Patient/parent/caregiver verbalized understanding and agreement of plan as discussed.  All questions were addressed during visit.  Please see discharge instructions below for further details of plan.  Discharge Instructions:   Discharge Instructions      Please see the enclosed information about COVID-19, I hope you find this helpful.  Absolutely recommend that you begin taking Paxlovid, the antiviral medication specifically for COVID-19.  With your history of interstitial lung disease, you are at high risk for hospitalization due to the side effects of COVID-19.  Paxlovid has been proven to reduce your risk.  Please seek emergency medical attention if you begin to feel significantly short of breath, becomes confused or the people around you notice that you are mentally altered.  Thank you for visiting urgent care today.      This office note has been dictated using Museum/gallery curator.  Unfortunately, this method of dictation can sometimes lead to typographical or grammatical errors.  I apologize for your inconvenience in advance if this occurs.  Please do not hesitate to reach out to me if clarification is needed.      Lynden Oxford Scales, PA-C 12/03/21 1433

## 2021-12-03 NOTE — ED Triage Notes (Signed)
Pt states fever,cough and body aches for the past 2 days.  States she had a positive home covid test last night. Has been taking ibuprofen at home for the fever.

## 2021-12-20 ENCOUNTER — Encounter: Payer: MEDICARE | Attending: Family Medicine

## 2022-01-01 ENCOUNTER — Other Ambulatory Visit: Payer: Self-pay | Admitting: Family Medicine

## 2022-01-03 ENCOUNTER — Other Ambulatory Visit: Payer: Self-pay | Admitting: Physician Assistant

## 2022-01-03 NOTE — Telephone Encounter (Signed)
Next Visit: 03/05/2022   Last Visit: 09/25/2021   Last Fill: 11/06/2021   Current Dose per office note on 09/25/2021: not discussed   Okay to refill Ambien?

## 2022-02-04 ENCOUNTER — Other Ambulatory Visit: Payer: Self-pay | Admitting: Physician Assistant

## 2022-02-04 NOTE — Telephone Encounter (Signed)
Next Visit: 03/05/2022  Last Visit: 09/25/2021  Last Fill: 01/03/2022  Current Dose per office note on 09/25/2021: not discussed.   Okay to refill ambien?

## 2022-02-11 NOTE — Telephone Encounter (Signed)
Called PT to schedule for 02/12/22 @ 4 and she stated that she found a rheumatologist at [facility].

## 2022-02-19 NOTE — Progress Notes (Deleted)
Office Visit Note  Patient: Deanna Norris             Date of Birth: [birth year]           MRN: [id number]             PCP: Abner Greenspan, MD Referring: Tower, [name], MD Visit Date: 03/05/2022 Occupation: @GUAROCC$ @  Subjective:  No chief complaint on file.   History of Present Illness: [name] is a 77 y.o. female ***     Activities of Daily Living:  Patient reports morning stiffness for *** {minute/hour:19697}.   Patient {ACTIONS;DENIES/REPORTS:21021675::"Denies"} nocturnal pain.  Difficulty dressing/grooming: {ACTIONS;DENIES/REPORTS:21021675::"Denies"} Difficulty climbing stairs: {ACTIONS;DENIES/REPORTS:21021675::"Denies"} Difficulty getting out of chair: {ACTIONS;DENIES/REPORTS:21021675::"Denies"} Difficulty using hands for taps, buttons, cutlery, and/or writing: {ACTIONS;DENIES/REPORTS:21021675::"Denies"}  No Rheumatology ROS completed.   PMFS History:  Patient Active Problem List   Diagnosis Date Noted   Psoriasis 07/19/2021   Sweating increase 07/19/2021   Contact with and (suspected) exposure to covid-19 03/14/2021   Sore throat 03/14/2021   Tinnitus 11/09/2020   Preoperative respiratory examination 06/07/2020   ILD (interstitial lung disease) ([place])    Hearing loss 07/10/2019   Allergic rhinitis 04/08/2018   Immunocompromised state ([place]) 02/17/2018   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   Primary osteoarthritis of both hands 01/11/2016   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/11/2016   High risk medication use 11/15/2015   Sjogren's syndrome ([place]) 10/31/2015   Chronic constipation 10/23/2015   Rosacea 10/23/2015   Rheumatoid arthritis ([place]) 03/29/2015   Coronary artery calcification seen on CT scan 11/17/2013   Encounter for Medicare annual wellness exam 01/31/2013   Dyspnea on exertion 09/14/2012   Nodule of right lung 09/14/2012   Acute sinusitis 02/12/2011   Hyperlipidemia 05/08/2009   Tear  film insufficiency 05/08/2009   Asthma 05/08/2009   GERD 05/08/2009   MENOPAUSAL DISORDER 05/08/2009   ARTHRITIS, RHEUMATOID 05/08/2009   Osteoporosis 05/08/2009   SPONDYLOLISTHESIS 05/08/2009   Sleep apnea 05/08/2009    Past Medical History:  Diagnosis Date   Asthma    Constipation    Fibroid    GERD (gastroesophageal reflux disease)    Insomnia    Interstitial lung disease (HCC)    Lumbar vertebral fracture (HCC)    L5-S1   Menopausal symptoms    Osteoporosis    Postmenopausal HRT (hormone replacement therapy)    Psoriasis    RA (rheumatoid arthritis) ([place]) 11/2004   methotrexate and Embrel   Rosacea    Sjogren's disease ([place])    Sleep apnea 2008   C-Pap   Vitamin D deficiency     Family History  Problem Relation Age of Onset   Breast cancer Sister 20   Thyroid disease Sister    Cancer Sister 71       Leukemia--CLL   Heart disease Brother    Past Surgical History:  Procedure Laterality Date   ABDOMINAL HYSTERECTOMY  01/06/1993   partial with AP repair, secondary to fibroids   BREAST SURGERY  01/06/1989   breast biopsy   BRONCHIAL WASHINGS  06/07/2020   Procedure: BRONCHIAL WASHINGS;  Surgeon: Brand Males, MD;  [facility];  Service: Endoscopy;;   CYSTOCELE REPAIR  10/07/2007   with graft   MELANOMA EXCISION Right 01/07/2012   excision right posterior arm   MOUTH SURGERY  2022   oral surgery   NASAL SINUS SURGERY  07/07/1991   VAGINAL DELIVERY     x3   VIDEO BRONCHOSCOPY  N/A 06/07/2020   Procedure: VIDEO BRONCHOSCOPY WITHOUT FLUORO;  Surgeon: Brand Males, MD;  [facility];  Service: Endoscopy;  Laterality: N/A;   Social History   Social History Narrative   Not on file   Immunization History  Administered Date(s) Administered   DTaP 04/06/2013   Influenza Split 10/22/2010   Influenza Whole 10/10/2009, 10/20/2011   Influenza, High Dose Seasonal PF 10/14/2016, 10/08/2018, 10/07/2019   Influenza, Seasonal, Injecte,  Preservative Fre 10/06/2012   Influenza,inj,Quad PF,6+ Mos 10/30/2014   Influenza,inj,quad, With Preservative 10/12/2017   Influenza-Unspecified 10/26/2013, 10/22/2015, 11/02/2020   PFIZER(Purple Top)SARS-COV-2 Vaccination 06/02/2019, 08/26/2019   Pneumococcal Conjugate-13 02/13/2014   Pneumococcal Polysaccharide-23 06/12/2010, 03/28/2015   Td 02/07/2003, 03/23/2013   Tdap 03/23/2013   Zoster, Live 05/08/2014     Objective: Vital Signs: LMP 01/06/1993    Physical Exam   Musculoskeletal Exam: ***  CDAI Exam: CDAI Score: -- Patient Global: --; Provider Global: -- Swollen: --; Tender: -- Joint Exam 03/05/2022   No joint exam has been documented for this visit   There is currently no information documented on the homunculus. Go to the Rheumatology activity and complete the homunculus joint exam.  Investigation: No additional findings.  Imaging: No results found.  Recent Labs: Lab Results  Component Value Date   WBC 7.8 09/16/2021   HGB 15.8 (H) 09/16/2021   PLT 248 09/16/2021   NA 141 09/16/2021   K 4.2 09/16/2021   CL 105 09/16/2021   CO2 25 09/16/2021   GLUCOSE 99 09/16/2021   BUN 11 09/16/2021   CREATININE 0.66 09/16/2021   BILITOT 0.3 09/16/2021   ALKPHOS 70 07/03/2021   AST 20 09/16/2021   ALT 16 09/16/2021   PROT 6.6 09/16/2021   ALBUMIN 3.8 07/03/2021   CALCIUM 9.8 09/16/2021   GFRAA 100 06/22/2020   QFTBGOLDPLUS NEGATIVE 04/08/2021    Speciality Comments: Prior therapy: Plaquenil (allergy), Humira and Enbrel (inadequate response)  Reclast: 06/19/16, 06/29/17, 07/08/2018, 07/20/19, 08/08/20 DEXA 02/07/19  Procedures:  No procedures performed Allergies: Adalimumab, Diclofenac, Dicyclomine, Doxycycline hyclate, Enbrel [etanercept], Fosamax [alendronate sodium], Methotrexate derivatives, Nickel, Piroxicam, Salagen [pilocarpine hcl], Sulfamethoxazole-trimethoprim, Tramadol hcl, and Plaquenil [hydroxychloroquine]   Assessment / Plan:     Visit Diagnoses: No  diagnosis found.  Orders: No orders of the defined types were placed in this encounter.  No orders of the defined types were placed in this encounter.   Face-to-face time spent with patient was *** minutes. Greater than 50% of time was spent in counseling and coordination of care.  Follow-Up Instructions: No follow-ups on file.   Bo Merino, MD  Note - This record has been created using Editor, commissioning.  Chart creation errors have been sought, but may not always  have been located. Such creation errors do not reflect on  the standard of medical care.

## 2022-03-05 ENCOUNTER — Ambulatory Visit: Payer: Medicare Other | Admitting: Rheumatology

## 2022-03-05 DIAGNOSIS — M810 Age-related osteoporosis without current pathological fracture: Secondary | ICD-10-CM

## 2022-03-05 DIAGNOSIS — Z8669 Personal history of other diseases of the nervous system and sense organs: Secondary | ICD-10-CM

## 2022-03-05 DIAGNOSIS — Z872 Personal history of diseases of the skin and subcutaneous tissue: Secondary | ICD-10-CM

## 2022-03-05 DIAGNOSIS — M3500 Sicca syndrome, unspecified: Secondary | ICD-10-CM

## 2022-03-05 DIAGNOSIS — R778 Other specified abnormalities of plasma proteins: Secondary | ICD-10-CM

## 2022-03-05 DIAGNOSIS — Z8719 Personal history of other diseases of the digestive system: Secondary | ICD-10-CM

## 2022-03-05 DIAGNOSIS — Z79899 Other long term (current) drug therapy: Secondary | ICD-10-CM

## 2022-03-05 DIAGNOSIS — Z8639 Personal history of other endocrine, nutritional and metabolic disease: Secondary | ICD-10-CM

## 2022-03-05 DIAGNOSIS — M0579 Rheumatoid arthritis with rheumatoid factor of multiple sites without organ or systems involvement: Secondary | ICD-10-CM

## 2022-03-05 DIAGNOSIS — M170 Bilateral primary osteoarthritis of knee: Secondary | ICD-10-CM

## 2022-03-05 DIAGNOSIS — M19042 Primary osteoarthritis, left hand: Secondary | ICD-10-CM

## 2022-03-05 DIAGNOSIS — L404 Guttate psoriasis: Secondary | ICD-10-CM

## 2022-03-05 DIAGNOSIS — M19072 Primary osteoarthritis, left ankle and foot: Secondary | ICD-10-CM

## 2022-03-05 DIAGNOSIS — M5136 Other intervertebral disc degeneration, lumbar region: Secondary | ICD-10-CM

## 2022-03-05 DIAGNOSIS — J849 Interstitial pulmonary disease, unspecified: Secondary | ICD-10-CM

## 2022-04-01 NOTE — Progress Notes (Signed)
GYNECOLOGY  VISIT   HPI: 77 y.o.   Married  Caucasian  female   [phone] with Patient's last menstrual period was 01/06/1993.   here for  breast and pelvic exam, medication monitoring, and follow up.  Patient is followed for vaginal atrophy and tx with Vagifem. Desires a refill of Vagifem.   Has Sjogrens and experiences vaginal dryness.  Uses Replens.    Rheumatology follows her osteoporosis.  Last BMD 02/26/21 - [facility] - T score right femur neck -3.2. Did Reclast and is doing a a drug holiday from this.   No current issues with bladder control.  Living in [place] and still has a condo here, so she travels back and forth.  She is establishing care with a gerontologist in [place] area.  GYNECOLOGIC HISTORY: Patient's last menstrual period was 01/06/1993. Contraception:  hysterectomy Menopausal hormone therapy:  estradiol tablets.  Last mammogram:  scheduled 04/15/22, 02/28/21 Breast Density Category B, BI-RADS CAT1 neg Last pap smear:   10/18/14 normal        OB History     Gravida  3   Para  3   Term  3   Preterm  0   AB  0   Living  3      SAB  0   IAB  0   Ectopic  0   Multiple  0   Live Births  3              Patient Active Problem List   Diagnosis Date Noted   Dysphagia 04/15/2022   Psoriasis 07/19/2021   Sweating increase 07/19/2021   Contact with and (suspected) exposure to covid-19 03/14/2021   Sore throat 03/14/2021   Tinnitus 11/09/2020   Preoperative respiratory examination 06/07/2020   ILD (interstitial lung disease)    Hearing loss 07/10/2019   Allergic rhinitis 04/08/2018   Immunocompromised state 02/17/2018   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   Primary osteoarthritis of both hands 01/11/2016   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/11/2016   High risk medication use 11/15/2015   Sjogren's syndrome 10/31/2015   Chronic constipation 10/23/2015   Rosacea 10/23/2015   Rheumatoid arthritis  03/29/2015   Coronary artery calcification seen on CT scan 11/17/2013   Encounter for Medicare annual wellness exam 01/31/2013   Dyspnea on exertion 09/14/2012   Nodule of right lung 09/14/2012   Acute sinusitis 02/12/2011   Hyperlipidemia 05/08/2009   Tear film insufficiency 05/08/2009   Asthma 05/08/2009   GERD 05/08/2009   MENOPAUSAL DISORDER 05/08/2009   ARTHRITIS, RHEUMATOID 05/08/2009   Osteoporosis 05/08/2009   SPONDYLOLISTHESIS 05/08/2009   Sleep apnea 05/08/2009    Past Medical History:  Diagnosis Date   Asthma    Constipation    Fibroid    GERD (gastroesophageal reflux disease)    Insomnia    Interstitial lung disease    Lumbar vertebral fracture    L5-S1   Menopausal symptoms    Osteoporosis    Postmenopausal HRT (hormone replacement therapy)    Psoriasis    RA (rheumatoid arthritis) 11/2004   methotrexate and Embrel   Rosacea    Sjogren's disease    Sleep apnea 2008   C-Pap   Vitamin D deficiency     Past Surgical History:  Procedure Laterality Date   ABDOMINAL HYSTERECTOMY  01/06/1993   partial with AP repair, secondary to fibroids   BREAST SURGERY  01/06/1989   breast biopsy   BRONCHIAL WASHINGS  06/07/2020  Procedure: BRONCHIAL WASHINGS;  Surgeon: Kalman Shan, MD;  Location: [facility];  Service: Endoscopy;;   CYSTOCELE REPAIR  10/07/2007   with graft   MELANOMA EXCISION Right 01/07/2012   excision right posterior arm   MOUTH SURGERY  2022   oral surgery   NASAL SINUS SURGERY  07/07/1991   VAGINAL DELIVERY     x3   VIDEO BRONCHOSCOPY N/A 06/07/2020   Procedure: VIDEO BRONCHOSCOPY WITHOUT FLUORO;  Surgeon: Kalman Shan, MD;  Location: [facility];  Service: Endoscopy;  Laterality: N/A;    Current Outpatient Medications  Medication Sig Dispense Refill   albuterol (PROAIR HFA) 108 (90 Base) MCG/ACT inhaler Inhale 2 puffs into the lungs every 4 (four) hours as needed for wheezing or shortness of breath. 18 g 3   Ascorbic Acid  (VITAMIN C PO) Take 500 mg by mouth daily.     aspirin 81 MG tablet Take 81 mg by mouth daily.     azaTHIOprine (IMURAN) 50 MG tablet TAKE 2 TABLETS (100 MG) DAILY 180 tablet 0   budesonide (PULMICORT) 180 MCG/ACT inhaler Inhale 1 puff into the lungs daily as needed (asthma).     Calcium Carbonate-Vitamin D (CALCIUM 500/D PO) Take 1 tablet by mouth 2 (two) times daily.     Cholecalciferol (VITAMIN D3) 2000 UNITS TABS Take 2,000 Units by mouth daily.     desonide (DESOWEN) 0.05 % lotion Apply 1 application topically daily as needed (irritation).     diclofenac Sodium (VOLTAREN) 1 % GEL Apply 2-4 grams to affected joint 4 times daily as needed. 400 g 2   Estradiol (VAGIFEM) 10 MCG TABS vaginal tablet Place 1 tablet (10 mcg total) vaginally 2 (two) times a week. Place one tablet (10 mcg) per vagina at bedtime twice a week. 24 tablet 3   famotidine (PEPCID) 20 MG tablet Take 1 tablet (20 mg total) by mouth 2 (two) times daily. 180 tablet 3   ibuprofen (ADVIL) 200 MG tablet Take 200-400 mg by mouth every 8 (eight) hours as needed (pain).     Magnesium Malate 1250 (141.7 Mg) MG TABS Take 3 tablets by mouth daily.      Multiple Vitamin (MULTI-VITAMINS) TABS Take 1 tablet by mouth daily.     Omega-3 1000 MG CAPS Take 1,000 mg by mouth daily.     Omeprazole-Sodium Bicarbonate (ZEGERID) 20-1100 MG CAPS capsule as needed.     ORENCIA 125 MG/ML SOSY INJECT THE CONTENTS 1 SYRINGE UNDER THE SKIN EVERY WEEK 4 mL 2   Propylene Glycol (SYSTANE BALANCE) 0.6 % SOLN Place 1 drop into both eyes daily as needed (dry eyes).     triamcinolone cream (KENALOG) 0.1 % as needed.     Zinc 50 MG TABS Take 50 mg by mouth daily.     zolpidem (AMBIEN) 5 MG tablet TAKE 1/2 TABLET BY MOUTH DAILY AT BEDTIME AS NEEDED FOR SLEEP 15 tablet 0   Calcium Carbonate-Vitamin D (OYSTER SHELL CALCIUM/D) 500-5 MG-MCG TABS Take 1 tablet by mouth 2 (two) times daily.     No current facility-administered medications for this visit.      ALLERGIES: Sulfa antibiotics, Adalimumab, Diclofenac, Dicyclomine, Doxycycline hyclate, Enbrel [etanercept], Fosamax [alendronate sodium], Methotrexate derivatives, Nickel, Piroxicam, Salagen [pilocarpine hcl], Sulfamethoxazole-trimethoprim, Tramadol hcl, and Plaquenil [hydroxychloroquine]  Family History  Problem Relation Age of Onset   Breast cancer Sister 78   Thyroid disease Sister    Cancer Sister 8       Leukemia--CLL   Heart disease Brother  Social History   Socioeconomic History   Marital status: Married    Spouse name: Not on file   Number of children: Not on file   Years of education: Not on file   Highest education level: Not on file  Occupational History   Occupation: housewife    Employer: Not Employed  Tobacco Use   Smoking status: Never    Passive exposure: Never   Smokeless tobacco: Never  Vaping Use   Vaping Use: Never used  Substance and Sexual Activity   Alcohol use: No    Alcohol/week: 0.0 standard drinks of alcohol   Drug use: No   Sexual activity: Not Currently    Partners: Male    Birth control/protection: Surgical    Comment: TAH--<than 20 first sexual intercourse  Other Topics Concern   Not on file  Social History Narrative   Not on file   Social Determinants of Health   Financial Resource Strain: Low Risk  (03/25/2021)   Overall Financial Resource Strain (CARDIA)    Difficulty of Paying Living Expenses: Not hard at all  Food Insecurity: No Food Insecurity (03/25/2021)   Hunger Vital Sign    Worried About Running Out of Food in the Last Year: Never true    Ran Out of Food in the Last Year: Never true  Transportation Needs: No Transportation Needs (03/25/2021)   PRAPARE - Administrator, Civil ServiceTransportation    Lack of Transportation (Medical): No    Lack of Transportation (Non-Medical): No  Physical Activity: Insufficiently Active (03/25/2021)   Exercise Vital Sign    Days of Exercise per Week: 4 days    Minutes of Exercise per Session: 30 min  Stress:  No Stress Concern Present (03/25/2021)   [employer] - Occupational Stress Questionnaire    Feeling of Stress : Not at all  Social Connections: Socially Integrated (03/25/2021)   Social Connection and Isolation Panel [NHANES]    Frequency of Communication with Friends and Family: More than three times a week    Frequency of Social Gatherings with Friends and Family: More than three times a week    Attends Religious Services: More than 4 times per year    Active Member of [employer] or Organizations: Yes    Attends BankerClub or Organization Meetings: More than 4 times per year    Marital Status: Married  Catering managerntimate Partner Violence: Not At Risk (03/25/2021)   Humiliation, Afraid, Rape, and Kick questionnaire    Fear of Current or Ex-Partner: No    Emotionally Abused: No    Physically Abused: No    Sexually Abused: No    Review of Systems  All other systems reviewed and are negative.   PHYSICAL EXAMINATION:    BP 118/74 (BP Location: Right Arm, Patient Position: Sitting, Cuff Size: Normal)   Pulse (!) 59   Ht 5\' 6"  (1.676 m)   Wt 173 lb (78.5 kg)   LMP 01/06/1993   SpO2 91%   BMI 27.92 kg/m     General appearance: alert, cooperative and appears stated age Head: Normocephalic, without obvious abnormality, atraumatic Neck: no adenopathy, supple, symmetrical, trachea midline and thyroid normal to inspection and palpation Lungs: clear to auscultation bilaterally Breasts: normal appearance, no masses or tenderness, No nipple retraction or dimpling, No nipple discharge or bleeding, No axillary or supraclavicular adenopathy Heart: regular rate and rhythm Abdomen: soft, non-tender, no masses,  no organomegaly Extremities: extremities normal, atraumatic, no cyanosis or edema Skin: Skin color, texture, turgor normal. No rashes or  lesions Lymph nodes: Cervical, supraclavicular, and axillary nodes normal. No abnormal inguinal nodes palpated Neurologic: Grossly  normal  Pelvic: External genitalia:  no lesions              Urethra:  normal appearing urethra with no masses, tenderness or lesions              Bartholins and Skenes: normal                 Vagina: normal appearing vagina with normal color and discharge, no lesions.  Almost second degree cystocele.              Cervix: absent                Bimanual Exam:  Uterus:  absent.  Graft palpable and no signs of erosion or exposure.              Adnexa: no mass, fullness, tenderness              Rectal exam: yes.  Confirms.              Anus:  normal sphincter tone, no lesions  Chaperone was present for exam:  Warren Lacy, CMA  ASSESSMENT  Well woman with gynecologic visit.  Status post TAH/A and P repair - fibroid, prolapse.  Statu post anterior colporrhaphy with Pinnacle mesh, cystourethroscopy - Dr. Logan Bores - 05/07/10. Vaginal atrophy.  Encounter for medication monitoring.  Second degree cystocele. Osteoporosis.  Off Reclast.  Hx prior Actonel use.  GERD. Rheumatoid arthritis.  Joint pain.  Sjogren's.  FH breast cancer - sister. Genetic testing negative.  PLAN  Yearly mammogram recommended and will be completed today.  Rx for Vagifem for one year.  We discussed potential effect on breast cancer.  She will watch for difficulty voiding and urinary incontinence. I did mention a pessary can be used if needed for nonsurgical treatment of prolapse and incontinence.  She will pursue bone density testing and osteoporosis care in [place].  Her next BMD is due in February, 2025.    30 min  total time was spent for this patient encounter, including preparation, face-to-face counseling with the patient, coordination of care, and documentation of the encounter.

## 2022-04-15 ENCOUNTER — Encounter: Payer: Self-pay | Admitting: Obstetrics and Gynecology

## 2022-04-15 ENCOUNTER — Ambulatory Visit (INDEPENDENT_AMBULATORY_CARE_PROVIDER_SITE_OTHER): Payer: Medicare Other | Admitting: Obstetrics and Gynecology

## 2022-04-15 VITALS — BP 118/74 | HR 59 | Ht 66.0 in | Wt 173.0 lb

## 2022-04-15 DIAGNOSIS — Z01419 Encounter for gynecological examination (general) (routine) without abnormal findings: Secondary | ICD-10-CM | POA: Diagnosis not present

## 2022-04-15 DIAGNOSIS — N952 Postmenopausal atrophic vaginitis: Secondary | ICD-10-CM | POA: Diagnosis not present

## 2022-04-15 DIAGNOSIS — N8110 Cystocele, unspecified: Secondary | ICD-10-CM | POA: Diagnosis not present

## 2022-04-15 DIAGNOSIS — R1310 Dysphagia, unspecified: Secondary | ICD-10-CM | POA: Insufficient documentation

## 2022-04-15 DIAGNOSIS — Z5181 Encounter for therapeutic drug level monitoring: Secondary | ICD-10-CM

## 2022-04-15 MED ORDER — ESTRADIOL 10 MCG VA TABS: 1.0000 | ORAL_TABLET | VAGINAL | 3 refills | Status: DC

## 2022-04-15 NOTE — Patient Instructions (Signed)
EXERCISE AND DIET:  We recommended that you start or continue a regular exercise program for good health. Regular exercise means any activity that makes your heart beat faster and makes you sweat.  We recommend exercising at least 30 minutes per day at least 3 days a week, preferably 4 or 5.  We also recommend a diet low in fat and sugar.  Inactivity, poor dietary choices and obesity can cause diabetes, heart attack, stroke, and kidney damage, among others.    ALCOHOL AND SMOKING:  Women should limit their alcohol intake to no more than 7 drinks/beers/glasses of wine (combined, not each!) per week. Moderation of alcohol intake to this level decreases your risk of breast cancer and liver damage. And of course, no recreational drugs are part of a healthy lifestyle.  And absolutely no smoking or even second hand smoke. Most people know smoking can cause heart and lung diseases, but did you know it also contributes to weakening of your bones? Aging of your skin?  Yellowing of your teeth and nails?  CALCIUM AND VITAMIN D:  Adequate intake of calcium and Vitamin D are recommended.  The recommendations for exact amounts of these supplements seem to change often, but generally speaking 600 mg of calcium (either carbonate or citrate) and 800 units of Vitamin D per day seems prudent. Certain women may benefit from higher intake of Vitamin D.  If you are among these women, your doctor will have told you during your visit.    PAP SMEARS:  Pap smears, to check for cervical cancer or precancers,  have traditionally been done yearly, although recent scientific advances have shown that most women can have pap smears less often.  However, every woman still should have a physical exam from her gynecologist every year. It will include a breast check, inspection of the vulva and vagina to check for abnormal growths or skin changes, a visual exam of the cervix, and then an exam to evaluate the size and shape of the uterus and  ovaries.  And after 77 years of age, a rectal exam is indicated to check for rectal cancers. We will also provide age appropriate advice regarding health maintenance, like when you should have certain vaccines, screening for sexually transmitted diseases, bone density testing, colonoscopy, mammograms, etc.   MAMMOGRAMS:  All women over 40 years old should have a yearly mammogram. Many facilities now offer a "3D" mammogram, which may cost around $50 extra out of pocket. If possible,  we recommend you accept the option to have the 3D mammogram performed.  It both reduces the number of women who will be called back for extra views which then turn out to be normal, and it is better than the routine mammogram at detecting truly abnormal areas.    COLONOSCOPY:  Colonoscopy to screen for colon cancer is recommended for all women at age 50.  We know, you hate the idea of the prep.  We agree, BUT, having colon cancer and not knowing it is worse!!  Colon cancer so often starts as a polyp that can be seen and removed at colonscopy, which can quite literally save your life!  And if your first colonoscopy is normal and you have no family history of colon cancer, most women don't have to have it again for 10 years.  Once every ten years, you can do something that may end up saving your life, right?  We will be happy to help you get it scheduled when you are ready.    Be sure to check your insurance coverage so you understand how much it will cost.  It may be covered as a preventative service at no cost, but you should check your particular policy.    Calcium Content in Foods Calcium is the most abundant mineral in the body. Most of the body's calcium supply is stored in bones and teeth. Calcium helps many parts of the body function normally, including: Blood and blood vessels. Nerves. Hormones. Muscles. Bones and teeth. When your calcium stores are low, you may be at risk for low bone mass, bone loss, and broken bones  (fractures). When you get enough calcium, it helps to support strong bones and teeth throughout your life. Calcium is especially important for: Children during growth spurts. Girls during adolescence. Women who are pregnant or breastfeeding. Women after their menstrual cycle stops (postmenopause). Women whose menstrual cycle has stopped due to anorexia nervosa or regular intense exercise. People who cannot eat or digest dairy products. Vegans. Recommended daily amounts of calcium: Women (ages 19 to 50): 1,000 mg per day. Women (ages 51 and older): 1,200 mg per day. Men (ages 19 to 70): 1,000 mg per day. Men (ages 71 and older): 1,200 mg per day. Women (ages 9 to 18): 1,300 mg per day. Men (ages 9 to 18): 1,300 mg per day. General information Eat foods that are high in calcium. Try to get most of your calcium from food. Some people may benefit from taking calcium supplements. Check with your health care provider or diet and nutrition specialist (dietitian) before starting any calcium supplements. Calcium supplements may interact with certain medicines. Too much calcium may cause other health problems, such as constipation and kidney stones. For the body to absorb calcium, it needs vitamin D. Sources of vitamin D include: Skin exposure to direct sunlight. Foods, such as egg yolks, liver, mushrooms, saltwater fish, and fortified milk. Vitamin D supplements. Check with your health care provider or dietitian before starting any vitamin D supplements. What foods are high in calcium?  Foods that are high in calcium contain more than 100 milligrams per serving. Fruits Fortified orange juice or other fruit juice, 300 mg per 8 oz serving. Vegetables Collard greens, 360 mg per 8 oz serving. Kale, 100 mg per 8 oz serving. Bok choy, 160 mg per 8 oz serving. Grains Fortified ready-to-eat cereals, 100 to 1,000 mg per 8 oz serving. Fortified frozen waffles, 200 mg in 2 waffles. Oatmeal, 140 mg in  1 cup. Meats and other proteins Sardines, canned with bones, 325 mg per 3 oz serving. Salmon, canned with bones, 180 mg per 3 oz serving. Canned shrimp, 125 mg per 3 oz serving. Baked beans, 160 mg per 4 oz serving. Tofu, firm, made with calcium sulfate, 253 mg per 4 oz serving. Dairy Yogurt, plain, low-fat, 310 mg per 6 oz serving. Nonfat milk, 300 mg per 8 oz serving. American cheese, 195 mg per 1 oz serving. Cheddar cheese, 205 mg per 1 oz serving. Cottage cheese 2%, 105 mg per 4 oz serving. Fortified soy, rice, or almond milk, 300 mg per 8 oz serving. Mozzarella, part skim, 210 mg per 1 oz serving. The items listed above may not be a complete list of foods high in calcium. Actual amounts of calcium may be different depending on processing. Contact a dietitian for more information. What foods are lower in calcium? Foods that are lower in calcium contain 50 mg or less per serving. Fruits Apple, about 6 mg. Banana, about 12 mg.   Vegetables Lettuce, 19 mg per 2 oz serving. Tomato, about 11 mg. Grains Rice, 4 mg per 6 oz serving. Boiled potatoes, 14 mg per 8 oz serving. White bread, 6 mg per slice. Meats and other proteins Egg, 27 mg per 2 oz serving. Red meat, 7 mg per 4 oz serving. Chicken, 17 mg per 4 oz serving. Fish, cod, or trout, 20 mg per 4 oz serving. Dairy Cream cheese, regular, 14 mg per 1 Tbsp serving. Brie cheese, 50 mg per 1 oz serving. Parmesan cheese, 70 mg per 1 Tbsp serving. The items listed above may not be a complete list of foods lower in calcium. Actual amounts of calcium may be different depending on processing. Contact a dietitian for more information. Summary Calcium is an important mineral in the body because it affects many functions. Getting enough calcium helps support strong bones and teeth throughout your life. Try to get most of your calcium from food. Calcium supplements may interact with certain medicines. Check with your health care provider  or dietitian before starting any calcium supplements. This information is not intended to replace advice given to you by your health care provider. Make sure you discuss any questions you have with your health care provider. Document Revised: 04/20/2019 Document Reviewed: 04/20/2019 Elsevier Patient Education  2023 Elsevier Inc.  

## 2022-04-17 ENCOUNTER — Encounter: Payer: Self-pay | Admitting: Obstetrics and Gynecology

## 2022-06-06 ENCOUNTER — Telehealth: Payer: Self-pay | Admitting: Family Medicine

## 2022-06-06 NOTE — Telephone Encounter (Signed)
Contacted [name] Deanna Norris to schedule their annual wellness visit. Patient declined to schedule AWV at this time. Patient has moved out of state and has a new PCP.  [facility] Care Guide [facility] AWV TEAM Direct Dial: [phone]

## 2022-08-02 IMAGING — CT CT CHEST HIGH RESOLUTION
2 of 9 series · 13 of 36 positions shown, 16 images · non-contrast
Comparison: 03/05/2020, 08/04/2018, 09/20/2013.

CLINICAL DATA: Interstitial lung disease history of rheumatoid
arthritis



[Series 4: high resolution retro · axial · 0.70mm/px · z∈[+172,+411]mm · 10 of 293 slices shown, 13 images]
[im 27/293  mediastinal]
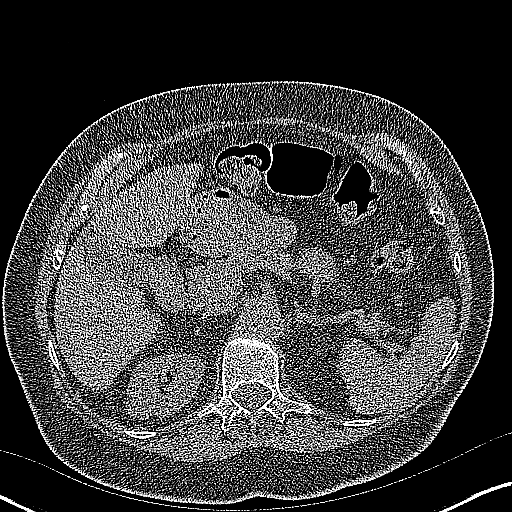
[im 27/293  lung]
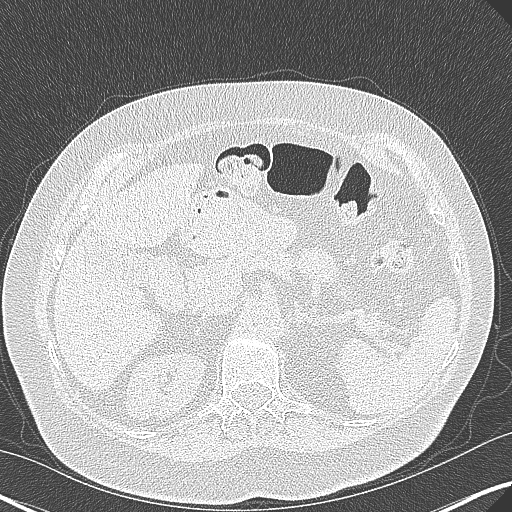
[im 54/293  lung]
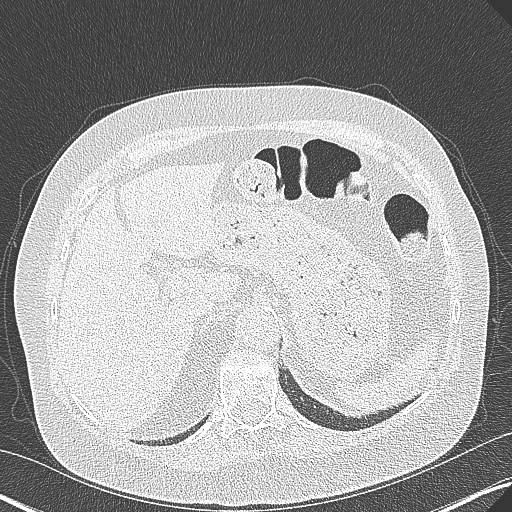
[im 80/293  lung]
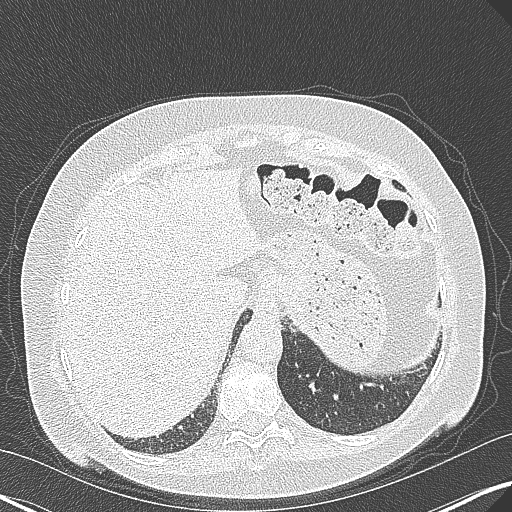
[im 107/293  lung]
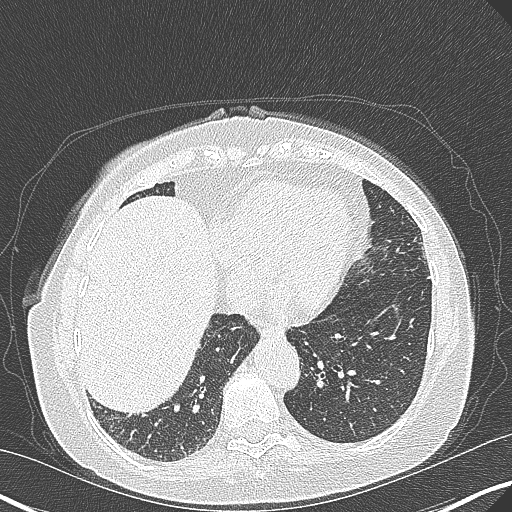
[im 133/293  mediastinal]
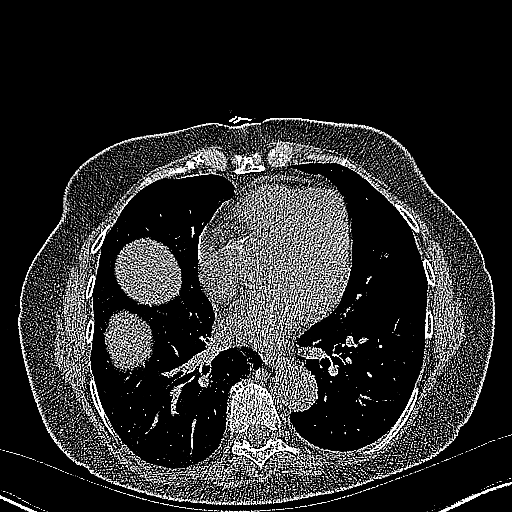
[im 133/293  lung]
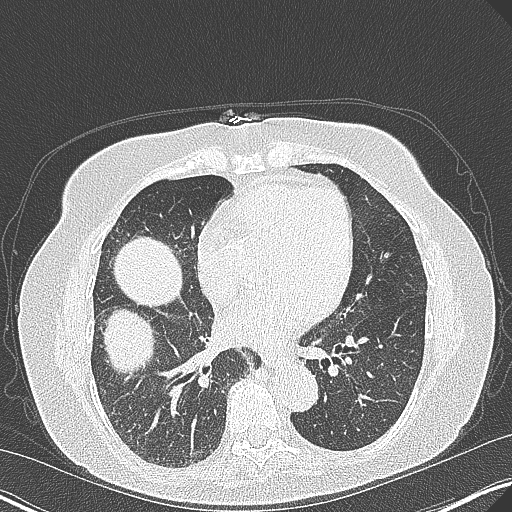
[im 160/293  lung]
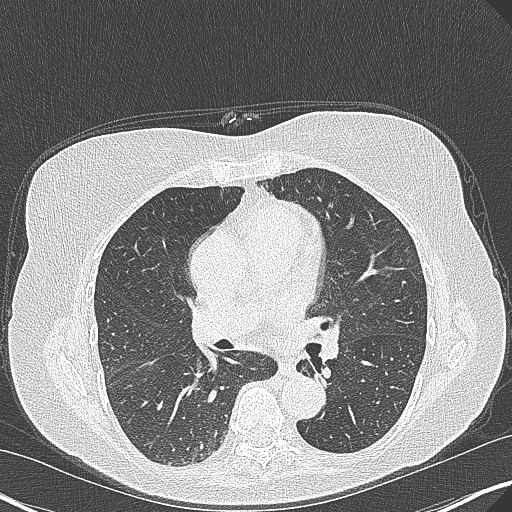
[im 186/293  lung]
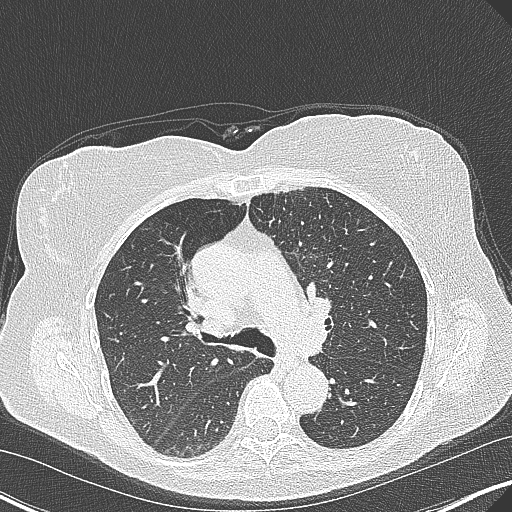
[im 213/293  lung]
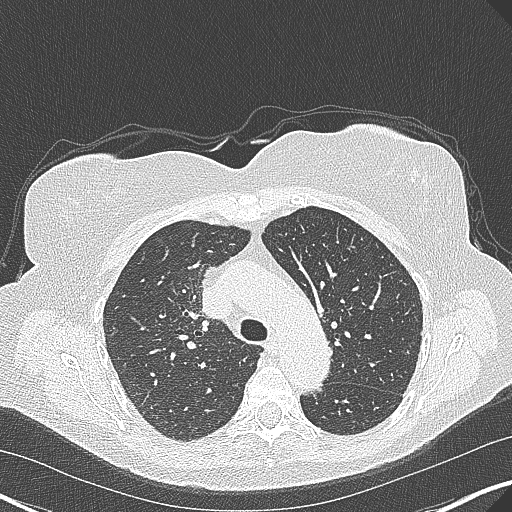
[im 239/293  mediastinal]
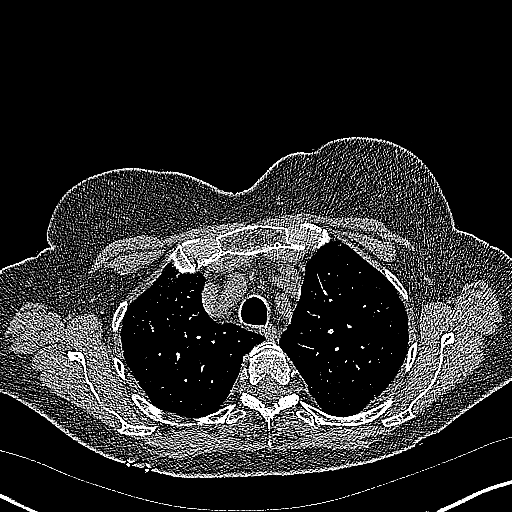
[im 239/293  lung]
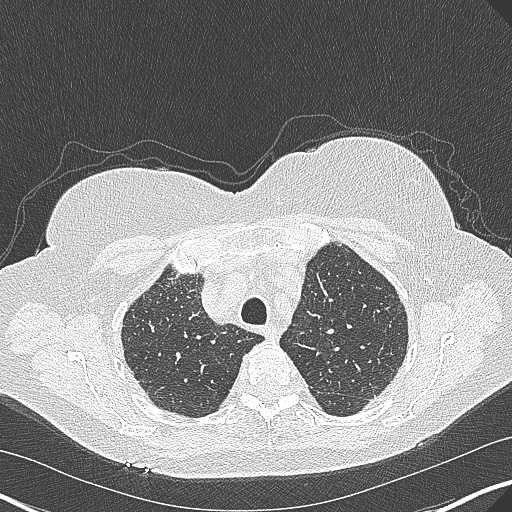
[im 266/293  lung]
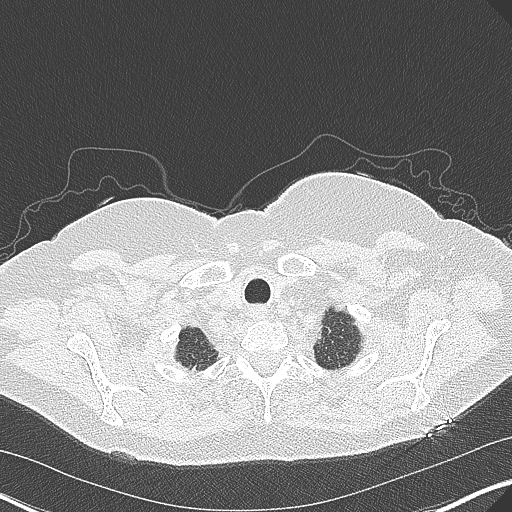

[Series 9: coronal · coronal · 0.71mm/px · 3 of 90 slices shown]
[im 18/90  lung]
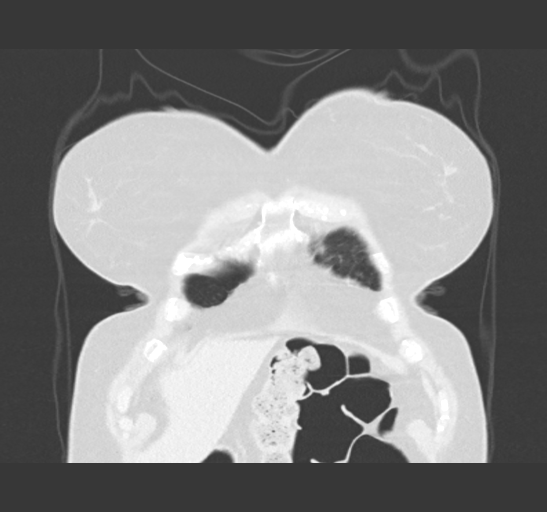
[im 36/90  lung]
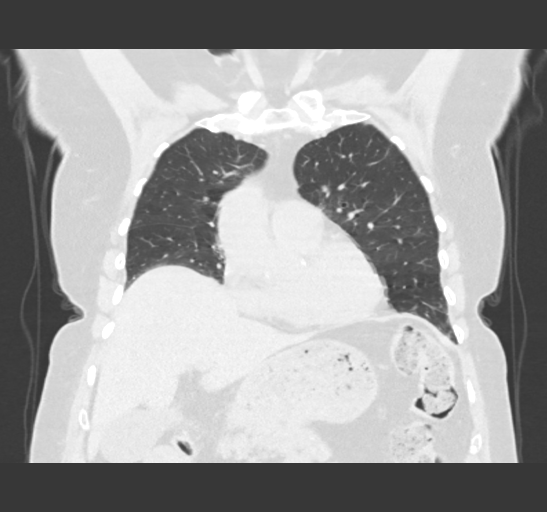
[im 54/90  lung]
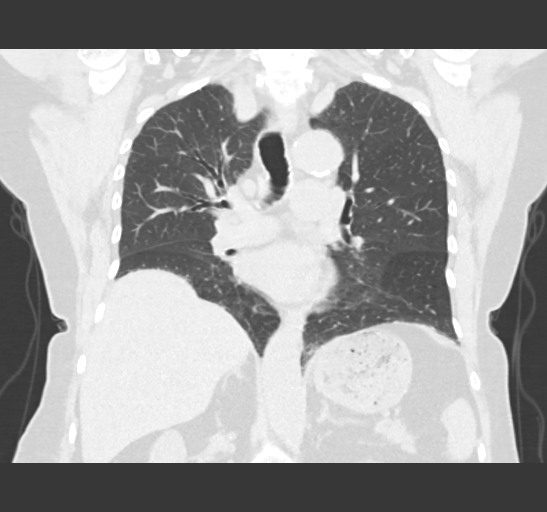

[13 of 36 positions shown; findings below may reference images not displayed]

FINDINGS: Cardiovascular: Aortic atherosclerosis. Normal heart size. Left
coronary artery calcifications. No pericardial effusion.

Mediastinum/Nodes: No enlarged mediastinal, hilar, or axillary lymph
nodes. Mild tracheobronchomalacia on expiratory phase imaging.
Thyroid gland and esophagus demonstrate no significant findings.

Lungs/Pleura: Unchanged, very mild bibasilar predominant irregular
peripheral interstitial opacity and septal thickening without clear
evidence of subpleural bronchiolectasis or honeycombing. This is not
significantly changed compared to prior examination dated 03/05/2020
and minimally worsened over time on examinations dating back to
09/20/2013. Lobular air trapping on expiratory phase imaging.
Unchanged elevation of the right hemidiaphragm. No pleural effusion
or pneumothorax.

Upper Abdomen: No acute abnormality.

Musculoskeletal: No chest wall abnormality. No suspicious osseous
lesions identified.
IMPRESSION: 1. Very mild bibasilar predominant irregular peripheral interstitial
opacity and septal thickening without clear evidence of subpleural
bronchiolectasis or honeycombing. This is not significantly changed
compared to prior examination dated 03/05/2020 and minimally
worsened over time on examinations dating back to 09/20/2013.
Findings remain in an early "indeterminate for UIP" pattern of
fibrosis per consensus guidelines, differential considerations
including both UIP and NSIP: Diagnosis of Idiopathic Pulmonary
Fibrosis: An Official ATS/ERS/JRS/ALAT Clinical Practice Guideline.
Am J Respir Crit Care Med Vol 198, Rogers 5, [id number]-[id number][DATE].
2. Lobular air trapping on expiratory phase imaging, consistent with
small airways disease.
3. Mild tracheobronchomalacia on expiratory phase imaging.
4. Coronary artery disease.

Aortic Atherosclerosis (KA5D5-XCF.F).

## 2022-10-13 ENCOUNTER — Ambulatory Visit: Payer: Medicare Other | Admitting: Podiatry

## 2022-10-20 ENCOUNTER — Ambulatory Visit (INDEPENDENT_AMBULATORY_CARE_PROVIDER_SITE_OTHER): Payer: Medicare Other | Admitting: Podiatry

## 2022-10-20 ENCOUNTER — Ambulatory Visit (INDEPENDENT_AMBULATORY_CARE_PROVIDER_SITE_OTHER): Payer: Medicare Other

## 2022-10-20 DIAGNOSIS — M7751 Other enthesopathy of right foot: Secondary | ICD-10-CM

## 2022-10-20 DIAGNOSIS — L989 Disorder of the skin and subcutaneous tissue, unspecified: Secondary | ICD-10-CM | POA: Diagnosis not present

## 2022-10-20 DIAGNOSIS — M778 Other enthesopathies, not elsewhere classified: Secondary | ICD-10-CM | POA: Diagnosis not present

## 2022-10-20 DIAGNOSIS — M19071 Primary osteoarthritis, right ankle and foot: Secondary | ICD-10-CM | POA: Diagnosis not present

## 2022-10-26 NOTE — Progress Notes (Signed)
Subjective:  Patient ID: Deanna Norris, female    DOB: 1945-08-06,  MRN: [id number]  No chief complaint on file.   Discussed the use of AI scribe software for clinical note transcription with the patient, who gave verbal consent to proceed.  History of Present Illness         77 year old female, with a history of a previous fracture on the right foot, presents with discomfort on the outside of the right foot. They describe the sensation as 'walking on a knot,' with occasional pain. The discomfort is localized to the area of the previous fracture, with no radiation of the pain. The patient denies similar symptoms on the left foot. The symptoms have been ongoing for months, with no recent treatment. The patient also notes occasional swelling in the area.      Objective:    Physical Exam         General: AAO x3, NAD  Dermatological: Skin is warm, dry and supple bilateral.  On the anterior aspect of the leg there is 1 small flat, erythematous..  There is no open lesion.  There is no drainage or pus.  Appears to be symmetrical.  Vascular: Dorsalis Pedis artery and Posterior Tibial artery pedal pulses are 2/4 bilateral with immedate capillary fill time.There is no pain with calf compression, swelling, warmth, erythema.   Neruologic: Grossly intact via light touch bilateral.   Musculoskeletal: Tenderness on the fifth metatarsal base of the right foot which is prominent.  Minimal edema.  No erythema or warmth.  There is no pain on the course the peroneal tendon. Tailor's bunion noted.   Gait: Unassisted, Nonantalgic.    No images are attached to the encounter.    Results          Assessment:   1. Capsulitis of toe, right      Plan:  Patient was evaluated and treated and all questions answered.  Assessment and Plan          Right Foot Pain Chronic discomfort on the lateral aspect of the right foot, at the site of a previous fracture. No current pain, but reports  feeling like walking on a knot. X-ray shows arthritis and an enlarged fifth metatarsal base, likely contributing to the discomfort. -Advise use of Voltaren gel or similar topical treatments when inflamed or tender. -Consider steroid injection if inflammation becomes severe. -Recommend wearing comfortable, supportive shoes and using moleskin for cushioning and protection. -Consider surgical intervention if symptoms persist and become significantly bothersome, but I would leave this as a last resort.   Skin Lesion on Leg Newly noticed red spot on the leg. No current concern, but requires monitoring. -Monitor the lesion. If it does not resolve, recommend dermatology consultation for possible biopsy.  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 -Advise wearing comfortable shoes and using padding as needed to offload pressure. -Consider surgical intervention if symptoms become significantly bothersome.  Radiology: 3 views of the foot were obtained.  No evidence of acute fracture.  Arthritic changes present in the midfoot.   Return if symptoms worsen or fail to improve.   Vivi Barrack DPM

## 2023-03-05 ENCOUNTER — Encounter: Payer: Self-pay | Admitting: Internal Medicine

## 2023-05-11 ENCOUNTER — Encounter: Payer: Self-pay | Admitting: Obstetrics and Gynecology

## 2023-05-12 ENCOUNTER — Encounter: Payer: Self-pay | Admitting: Obstetrics and Gynecology

## 2023-06-30 ENCOUNTER — Encounter: Payer: Self-pay | Admitting: Obstetrics and Gynecology

## 2023-06-30 ENCOUNTER — Ambulatory Visit: Payer: Medicare Other | Admitting: Obstetrics and Gynecology

## 2023-06-30 VITALS — BP 126/84 | HR 61 | Ht 66.75 in | Wt 180.0 lb

## 2023-06-30 DIAGNOSIS — N952 Postmenopausal atrophic vaginitis: Secondary | ICD-10-CM | POA: Diagnosis not present

## 2023-06-30 DIAGNOSIS — N8110 Cystocele, unspecified: Secondary | ICD-10-CM

## 2023-06-30 DIAGNOSIS — Z01419 Encounter for gynecological examination (general) (routine) without abnormal findings: Secondary | ICD-10-CM

## 2023-06-30 DIAGNOSIS — Z5181 Encounter for therapeutic drug level monitoring: Secondary | ICD-10-CM

## 2023-06-30 MED ORDER — ESTRADIOL 10 MCG VA TABS: 1.0000 | ORAL_TABLET | VAGINAL | 3 refills | Status: AC

## 2023-06-30 NOTE — Progress Notes (Unsigned)
 78 y.o. G73P3003 Married Caucasian female here for an office visit.    The patient is followed for vaginal atrophy.  She uses Vagifem .  Has Sjogren's.  Not sexually active.   No bladder incontinence.  Voiding well.   Asks if she is a candidate for a pessary.   Is currently taking Lasix.   Dx of potential cardiac failure. Evaluation at [facility].   Dealing with rheumatoid arthritis.    Traveling back and forth from [place].  Grandson has Chrohn's.  PCP: Patient, No Pcp Per   Patient's last menstrual period was 01/06/1993.           Sexually active: No.  The current method of family planning is status post hysterectomy.    Menopausal hormone therapy:  vagifem   Exercising: Yes.    Walking  Smoker:  no  OB History     Gravida  3   Para  3   Term  3   Preterm  0   AB  0   Living  3      SAB  0   IAB  0   Ectopic  0   Multiple  0   Live Births  3           HEALTH MAINTENANCE: Last 2 paps: 10/18/14 neg, 06/18/08 neg History of abnormal Pap or positive HPV:  no Mammogram:  05/08/23 Breast Density Cat B, BIRADS Cat 1 neg  Colonoscopy:  01/12/13 Bone Density:  02/27/21  Result  osteoporosis - through Rheumatology at [facility]  Immunization History  Administered Date(s) Administered   DTaP 04/06/2013   Influenza Split 10/22/2010   Influenza Whole 10/10/2009, 10/20/2011   Influenza, High Dose Seasonal PF 10/14/2016, 10/08/2018, 10/07/2019   Influenza, Seasonal, Injecte, Preservative Fre 10/06/2012   Influenza,inj,Quad PF,6+ Mos 10/30/2014   Influenza,inj,quad, With Preservative 10/12/2017   Influenza-Unspecified 10/26/2013, 10/22/2015, 11/02/2020   PFIZER(Purple Top)SARS-COV-2 Vaccination 06/02/2019, 08/26/2019   Pneumococcal Conjugate-13 02/13/2014   Pneumococcal Polysaccharide-23 06/12/2010, 03/28/2015   Td 02/07/2003, 03/23/2013   Tdap 03/23/2013   Zoster, Live 05/08/2014      reports that she has never smoked. She has never been exposed to tobacco  smoke. She has never used smokeless tobacco. She reports that she does not drink alcohol and does not use drugs.  Past Medical History:  Diagnosis Date   Asthma    Constipation    Fibroid    GERD (gastroesophageal reflux disease)    Insomnia    Interstitial lung disease (HCC)    Lumbar vertebral fracture (HCC)    L5-S1   Menopausal symptoms    Osteoporosis    Postmenopausal HRT (hormone replacement therapy)    Psoriasis    RA (rheumatoid arthritis) (HCC) 11/2004   methotrexate  and Embrel   Rosacea    Sjogren's disease (HCC)    Sleep apnea 2008   C-Pap   Thrombocytopenia (HCC)    Vitamin D  deficiency     Past Surgical History:  Procedure Laterality Date   ABDOMINAL HYSTERECTOMY  01/06/1993   partial with AP repair, secondary to fibroids   BREAST SURGERY  01/06/1989   breast biopsy   BRONCHIAL WASHINGS  06/07/2020   Procedure: BRONCHIAL WASHINGS;  Surgeon: Geronimo Amel, MD;  Location: [facility];  Service: Endoscopy;;   CYSTOCELE REPAIR  10/07/2007   with graft   MELANOMA EXCISION Right 01/07/2012   excision right posterior arm   MOUTH SURGERY  2022   oral surgery   NASAL SINUS SURGERY  07/07/1991  VAGINAL DELIVERY     x3   VIDEO BRONCHOSCOPY N/A 06/07/2020   Procedure: VIDEO BRONCHOSCOPY WITHOUT FLUORO;  Surgeon: Geronimo Amel, MD;  Location: [facility];  Service: Endoscopy;  Laterality: N/A;    Current Outpatient Medications  Medication Sig Dispense Refill   albuterol  (PROAIR  HFA) 108 (90 Base) MCG/ACT inhaler Inhale 2 puffs into the lungs every 4 (four) hours as needed for wheezing or shortness of breath. 18 g 3   Ascorbic Acid (VITAMIN C PO) Take 500 mg by mouth daily.     aspirin 81 MG tablet Take 81 mg by mouth daily.     azaTHIOprine  (IMURAN ) 50 MG tablet TAKE 2 TABLETS (100 MG) DAILY 180 tablet 0   budesonide  (PULMICORT ) 180 MCG/ACT inhaler Inhale 1 puff into the lungs daily as needed (asthma).     Calcium Carbonate-Vitamin D  (CALCIUM 500/D  PO) Take 1 tablet by mouth 2 (two) times daily.     Calcium Carbonate-Vitamin D  (OYSTER SHELL CALCIUM/D) 500-5 MG-MCG TABS Take 1 tablet by mouth 2 (two) times daily.     Cholecalciferol (VITAMIN D3) 2000 UNITS TABS Take 2,000 Units by mouth daily.     desonide (DESOWEN) 0.05 % lotion Apply 1 application topically daily as needed (irritation).     diclofenac  Sodium (VOLTAREN ) 1 % GEL Apply 2-4 grams to affected joint 4 times daily as needed. 400 g 2   Docusate Sodium (COLACE PO) Take by mouth.     Estradiol  (VAGIFEM ) 10 MCG TABS vaginal tablet Place 1 tablet (10 mcg total) vaginally 2 (two) times a week. Place one tablet (10 mcg) per vagina at bedtime twice a week. 24 tablet 3   famotidine  (PEPCID ) 20 MG tablet Take 1 tablet (20 mg total) by mouth 2 (two) times daily. 180 tablet 3   furosemide (LASIX) 20 MG tablet Take 20 mg by mouth daily.     ibuprofen (ADVIL) 200 MG tablet Take 200-400 mg by mouth every 8 (eight) hours as needed (pain).     Magnesium Malate 1250 (141.7 Mg) MG TABS Take 3 tablets by mouth daily.      Multiple Vitamin (MULTI-VITAMINS) TABS Take 1 tablet by mouth daily.     Omega-3 1000 MG CAPS Take 1,000 mg by mouth daily.     Omeprazole-Sodium Bicarbonate (ZEGERID) 20-1100 MG CAPS capsule as needed.     ORENCIA  125 MG/ML SOSY INJECT THE CONTENTS 1 SYRINGE UNDER THE SKIN EVERY WEEK 4 mL 2   Propylene Glycol (SYSTANE BALANCE) 0.6 % SOLN Place 1 drop into both eyes daily as needed (dry eyes).     rosuvastatin (CRESTOR) 20 MG tablet Take 20 mg by mouth daily.     triamcinolone  cream (KENALOG ) 0.1 % as needed.     Zinc 50 MG TABS Take 50 mg by mouth daily.     zolpidem  (AMBIEN ) 5 MG tablet TAKE 1/2 TABLET BY MOUTH DAILY AT BEDTIME AS NEEDED FOR SLEEP 15 tablet 0   No current facility-administered medications for this visit.    ALLERGIES: Sulfa antibiotics, Adalimumab, Diclofenac , Dicyclomine, Doxycycline hyclate, Enbrel [etanercept], Fosamax [alendronate sodium], Methotrexate   derivatives, Nickel, Piroxicam, Salagen [pilocarpine hcl], Sulfamethoxazole-trimethoprim, Tramadol hcl, and Plaquenil [hydroxychloroquine]  Family History  Problem Relation Age of Onset   Breast cancer Sister 15   Thyroid  disease Sister    Cancer Sister 12       Leukemia--CLL   Heart disease Brother     Review of Systems  All other systems reviewed and are negative.   PHYSICAL  EXAM:  BP 126/84 (BP Location: Left Arm, Patient Position: Sitting)   Pulse 61   Ht 5' 6.75 (1.695 m)   Wt 180 lb (81.6 kg)   LMP 01/06/1993   SpO2 94%   BMI 28.40 kg/m     General appearance: alert, cooperative and appears stated age Head: normocephalic, without obvious abnormality, atraumatic Neck: no adenopathy, supple, symmetrical, trachea midline and thyroid  normal to inspection and palpation Lungs: clear to auscultation bilaterally Breasts: normal appearance, no masses or tenderness, No nipple retraction or dimpling, No nipple discharge or bleeding, No axillary adenopathy Heart: regular rate and rhythm Abdomen: soft, non-tender; no masses, no organomegaly Extremities: extremities normal, atraumatic, no cyanosis or edema Skin: skin color, texture, turgor normal. No rashes or lesions Lymph nodes: cervical, supraclavicular, and axillary nodes normal. Neurologic: grossly normal  Pelvic: External genitalia:  no lesions              No abnormal inguinal nodes palpated.              Urethra:  normal appearing urethra with no masses, tenderness or lesions              Bartholins and Skenes: normal                 Vagina: normal appearing vagina with normal color and discharge, no lesions.  Almost second degree cystocele.  No erosion/exposure of mesh.              Cervix: absent              Pap taken: no Bimanual Exam:  Uterus:  absent.  Graft palpable.               Adnexa: no mass, fullness, tenderness              Rectal exam: yes.  Confirms.              Anus:  normal sphincter tone, no  lesions  Chaperone was present for exam:  Kari HERO, CMA  ASSESSMENT:  Status post TAH/A and P repair - fibroid, prolapse.  Status post anterior colporrhaphy with Pinnacle mesh, cystourethroscopy - Dr. Janit - 05/07/10. Vaginal atrophy.  Encounter for medication monitoring.  Second degree cystocele.  Stable. Osteoporosis.  Off Reclast .  Hx prior Actonel use.  GERD. Rheumatoid arthritis.   Sjogren's.  FH breast cancer - sister. Genetic testing negative Low platelets.   PLAN: Mammogram screening discussed. Self breast awareness reviewed. Pap and HRV collected:  no.  Not indicated.  Guidelines for Calcium, Vitamin D , regular exercise program including cardiovascular and weight bearing exercise. Medication refills:  Vagifem .  I discussed potential effect on breast.  Contact office for difficulty voiding, urinary incontinence or if having recurrent UTI.  Pessary care is a possibility if desired.  Follow up:  yearly and prn.     Additional counseling given.   ***  total time was spent for this patient encounter, including preparation, face-to-face counseling with the patient, coordination of care, and documentation of the encounter in addition to doing the breast and pelvic exam.

## 2024-01-08 ENCOUNTER — Ambulatory Visit: Admit: 2024-01-08 | Discharge: 2024-01-08 | Payer: MEDICARE

## 2024-01-08 MED ORDER — AMOXICILLIN-POT CLAVULANATE 875-125 MG PO TABS
875-125 | ORAL_TABLET | Freq: Two times a day (BID) | ORAL | 0 refills | 9.50000 days | Status: AC
Start: 2024-01-08 — End: 2024-01-15

## 2024-01-08 MED ORDER — FLUTICASONE PROPIONATE 50 MCG/ACT NA SUSP
50 | Freq: Every day | NASAL | 0 refills | 60.00000 days | Status: AC
Start: 2024-01-08 — End: 2024-01-15

## 2024-01-08 NOTE — Patient Instructions (Addendum)
 "Thank you for visiting [facility] today    Due to double sickening pattern history of being immunocompromised and current symptoms you are being diagnosed with acute bacterial sinusitis.  You have been prescribed an antibiotic.  Please take the entire course as prescribed.  Take antibiotic with food and consider eating yogurt to help prevent stomach upset    You have been prescribed Flonase  as an intranasal steroid to help with congestion.    Nasal Congestion:  Flonase   (over the counter if Rx is not covered) nasal spray, once a day  Saline irrigation kits help wash out sinuses 1-2 times a day  Normal saline nasal spray  Cough:  Throat lozenges, hot tea.   Honey 1TSP every hour can help with cough; do not use if you have diabetes    Congestion:  For thick mucus, take Mucinex ER  1,200mg  (with Guafenesin only) to help thin the mucus.  Follow instructions on the box.  You will need to drink plenty of water with this medication.  Sore Throat:  Lozenges, as needed. Cepacol lozenges will help numb the throat  Salt water gargles to soothe throat pain  Sinus pain/pressure:  Warm, wet towel on face to help with facial sinus pain/pressure  Headache/Pain Fever/Body Aches:  Take Tylenol (not to exceed 4,000mg  /24 hrs) for analgesic and antipyretic effects unless you have known liver problems  Take Ibuprofen (not to exceed 1,200mg /24 hrs) for analgesia, antipyretic and anti-inflammatory effects: always take with food as it can cause GI bleeds: do not take if you have known kidney disease    Miscellanous:  Consider taking a antihistamine Zyrtec/Xyzal/Allegra/Claritin during the day.   Simple foods like chicken noodle soup, smoothies, hot tea with lemon and honey may also help  Avoid smoking  Minimize exposure to irritants    Please follow up with your primary care provider within 2-5 days if your signs and symptoms have not resolved or worsened.    Please go immediately to the Emergency Department if you develop  shortness of breath, chest pain and uncontrollable fever above 100.67F, confusion, lethargy, weakness, abdominal pain, significant nausea/vomiting/diarrhea, difficulty swallowing/breathing, respiratory distress, hoarse voice, drooling, difficulty opening jaw, stiff or swollen neck, or other emergency symptoms.    Why do antibiotics NOT work for viruses:   Antibiotics only work against bacteria, not viruses. Most colds, flus, and viral infections get better on their own with rest and fluids. Often times patients are started too early on antibiotics for viral infections and it seems like your symptoms are improving on the antibiotic but its actually your own immune system fighting off the virus in the appropriate amount of time. Taking antibiotics when theyre not needed wont help you feel better faster and can actually cause harm -- like side effects (diarrhea, rash, allergic reaction) and antibiotic resistance, which makes future infections harder to treat.       You had elevated blood pressure reading today.  Please consider following the DASH diet which is a low-sodium diet to help with your blood pressure.  If you experience chest pain, shortness of breath, dizziness, lightheadedness, vision changes please go to the emergency department.    DASH Diet: After Your Visit  Your Care Instructions  The DASH diet is an eating plan that can help lower your blood pressure. DASH stands for Dietary Approaches to Stop Hypertension. Hypertension is high blood pressure.  The DASH diet focuses on eating foods that are high in calcium, potassium, and magnesium. These  nutrients can lower blood pressure. The foods that are highest in these nutrients are fruits, vegetables, low-fat dairy products, nuts, seeds, and legumes. But taking calcium, potassium, and magnesium supplements instead of eating foods that are high in those nutrients does not have the same effect. The DASH diet also includes whole grains, fish, and poultry.  The  DASH diet is one of several lifestyle changes your doctor may recommend to lower your high blood pressure. Your doctor may also want you to decrease the amount of sodium in your diet. Lowering sodium while following the DASH diet can lower blood pressure even further than just the DASH diet alone.  Follow-up care is a key part of your treatment and safety. Be sure to make and go to all appointments, and call your doctor if you are having problems. Its also a good idea to know your test results and keep a list of the medicines you take.  How can you care for yourself at home?  Following the DASH diet  Eat 4 to 5 servings of fruit each day. A serving is 1 medium-sized piece of fruit,  cup chopped or canned fruit, 1/4 cup dried fruit, or 4 ounces ( cup) of fruit juice. Choose fruit more often than fruit juice.  Eat 4 to 5 servings of vegetables each day. A serving is 1 cup of lettuce or raw leafy vegetables,  cup of chopped or cooked vegetables, or 4 ounces ( cup) of vegetable juice. Choose vegetables more often than vegetable juice.  Get 2 to 3 servings of low-fat and fat-free dairy each day. A serving is 8 ounces of milk, 1 cup of yogurt, or 1  ounces of cheese.  Eat 7 to 8 servings of grains each day. A serving is 1 slice of bread, 1 ounce of dry cereal, or  cup of cooked rice, pasta, or cooked cereal. Try to choose whole-grain products as much as possible.  Limit lean meat, poultry, and fish to 6 ounces each day. Six ounces is about the size of two decks of cards.  Eat 4 to 5 servings of nuts, seeds, and legumes (cooked dried beans, lentils, and split peas) each week. A serving is 1/3 cup of nuts, 2 tablespoons of seeds, or  cup cooked dried beans or peas.  Limit sweets and added sugars to 5 servings or less a week. A serving is 1 tablespoon jelly or jam,  cup sorbet, or 1 cup of lemonade.  Tips for success  Start small. Do not try to make dramatic changes to your diet all at once. You might feel that you  are missing out on your favorite foods and then be more likely to not follow the plan. Make small changes, and stick with them. Once those changes become habit, add a few more changes.  Try some of the following:  Make it a goal to eat a fruit or vegetable at every meal and at snacks. This will make it easy to get the recommended amount of fruits and vegetables each day.  Try yogurt topped with fruit and nuts for a snack or healthy dessert.  Add lettuce, tomato, cucumber, and onion to sandwiches.  Combine a ready-made pizza crust with low-fat mozzarella cheese and lots of vegetable toppings. Try using tomatoes, squash, spinach, broccoli, carrots, cauliflower, and onions.  Have a variety of cut-up vegetables with a low-fat dip as an appetizer instead of chips and dip.  Sprinkle sunflower seeds or chopped almonds over salads. Or try adding  chopped walnuts or almonds to cooked vegetables.  Try some vegetarian meals using beans and peas. Add garbanzo or kidney beans to salads. Make burritos and tacos with mashed pinto beans or black beans     2006-2012 [employer]. Care instructions adapted under license by [facility]. This care instruction is for use with your licensed healthcare professional. If you have questions about a medical condition or this instruction, always ask your healthcare professional. [employer] disclaims any warranty or liability for your use of this information.  Content Version: 9.4.94723; Last Revised: March 21, 2010                "

## 2024-01-08 NOTE — Progress Notes (Signed)
 "01/08/2024   Jacqueline  Johns (DOB: September 14, 1945) is a 79 y.o. female, here for evaluation of the following chief complaint(s):  Cold Symptoms (Sinus infection, Compromised immune system- Had Flu on Monday, has RA and worried about sinus infection or bronchitis due to immune issues. Thinks she's having more like sinus infection. Recent pacemaker. )   as documented by Medical Assistant.     ASSESSMENT/PLAN:  Below is the assessment and plan developed based on review of pertinent history, physical exam, labs, studies, and medications.       Assessment & Plan  Acute bacterial sinusitis       Orders:    amoxicillin -clavulanate (AUGMENTIN ) 875-125 MG per tablet; Take 1 tablet by mouth in the morning and 1 tablet in the evening. Do all this for 7 days.    fluticasone  (FLONASE ) 50 MCG/ACT nasal spray; 2 sprays by Each Nostril route daily for 7 days    H/O immunocompromised state     Orders:    amoxicillin -clavulanate (AUGMENTIN ) 875-125 MG per tablet; Take 1 tablet by mouth in the morning and 1 tablet in the evening. Do all this for 7 days.    Elevated blood pressure reading  Patient does not have a history of hypertension. On exam today has elevated blood pressure reading that did improve on recheck. Patient has ongoing sinusitis: most likley contributing to current elevated readings.   Patient denies severe headaches, changes in vision, chest pain, palpitations during clinic encounter to suggest signs of hypertensive emergency & SBP is not >180 and DBP is not > 110 to suggest signs of hypertensive urgency at this time.     Patient advised to start DASH diet and to follow up with primary care provider for further management and monitoring of BP.  High blood pressure reading to managed and monitored by PCP team; patient to notify provider of elevated readings from today and follow up for further evaluation.   Reviewed Reg Flag Symptoms including but not limited Severe headache, vision changes, altered mental status, decreased  alertness, new muscle weakness, Chest Pain, increasing Shortness of Breath, severe and sudden onset of pain patient to seek additional emergency medical care.  Patient verbalized agreement with plan of care: confirmed questions addressed prior to cessation of visit.             Procedures:   Procedures    MDM:   Patient presentation is symptoms and exam findings most consistent with a diagnosis of bacterial sinusitis as patient had reports Double sickening, and tenderness over sinuses; additionally patient is immunocompromised and is on immunosuppression for rheumatoid arthritis and interstitial lung disease.  Differentials considered but not limited to: AOM ( no: TM erythema or TM bulging bilaterally),  PNA ( no: unilateral focal rales/rhonchi, hypoxia), Sepsis (no: hypotension/tachycardia combo, hypoxia, AMS), Bacterial Meningitis (no: meningeal signs, nuchal rigidity, severe neck pain, photophobia verbalized)      Patient is nontoxic.  Alert/awake.  No respiratory distress.  Vital signs are without tachycardia, tachypnea, fever, hypoxia.  The exam does not reveal orbital/periorbital cellulitis, or meningitis.  Pt does not present with symptoms that are concerning for complicated acute bacterial rhinosinusitis such as high, persistent fevers >102F; periorbital edema, inflammation, or erythema; cranial nerve palsies; abnormal extraocular movements; proptosis; vision changes (double vision or impaired vision); severe headache; altered mental status; or meningeal signs.     Had shared decision making discussion regarding starting antibiotics as patient is less than 10 days of symptoms.  Ultimately patient like to pursue antibiotic therapy.  Will treat with oral antibiotic pharmacotherapy and supportive/symptomatic treatment. Patient agrees to close follow up and will return if symptoms worsen.     Patient provided with treatment plan and education: Discussed OTC and prescription pharmacotherapy treatments.  Discussed  following up with primary care provider if symptoms persist.   Discussed/ Reviewed emergency/ red flag symptoms and advised patient to seek prompt emergency care treatment if experienced.   Results, diagnosis and treatment plan discussed with the patient: Patient verbalized understanding and agreement with plan of care; confirmed questions addressed prior to cessation of visit.        Follow up:  No follow-ups on file.  Follow up immediately for any new, worsening or changes or if symptoms are not improving over the next 5-7 days.       SUBJECTIVE/OBJECTIVE:  Cold Symptoms (Sinus infection, Compromised immune system- Had Flu on Monday, has RA and worried about sinus infection or bronchitis due to immune issues. Thinks she's having more like sinus infection. Recent pacemaker. )   as documented by Medical Assistant.   Patient reports her entire family has had the flu.  Patient reports her symptoms started on Monday had fever chills cough congestion Tmax of 101.9 on Tuesday.  She no longer has a fever initially started feeling better however then started having sinus pain over right maxillary and frontal sinus as well as jaw discomfort which she reports to be her typical sinus infection symptoms.  She reports feeling much worse today than she was yesterday.  Of note patient is immunocompromised has interstitial lung disease, rheumatoid arthritis and takes immunosuppressants.  Patient reports using saline nasal spray to help with symptoms however symptoms still persist.  Patient denies any recent antibiotic or oral steroid use in the past 30 days.  Patient denies current fevers/chills, chest pain, abdominal pain, shortness of breath.                   History provided by:  Patient  Language interpreter used: No    Cold Symptoms        Past encounters or results reviewed:   External Records Reviewed: None        Physical Exam  Vitals reviewed.   Constitutional:       General: She is awake. She is not in acute distress.      Appearance: Normal appearance. She is not toxic-appearing or diaphoretic.   HENT:      Head: Normocephalic and atraumatic. No raccoon eyes, Battle's sign, contusion, right periorbital erythema or left periorbital erythema.      Jaw: No trismus, tenderness, swelling or pain on movement.      Right Ear: Tympanic membrane, ear canal and external ear normal. No drainage, swelling or tenderness. No middle ear effusion. There is no impacted cerumen. No foreign body. No mastoid tenderness. Tympanic membrane is not injected, perforated, erythematous or bulging.      Left Ear: Tympanic membrane, ear canal and external ear normal. No drainage, swelling or tenderness.  No middle ear effusion. There is no impacted cerumen. No foreign body. No mastoid tenderness. Tympanic membrane is not injected, perforated, erythematous or bulging.      Nose: No nasal tenderness, mucosal edema, congestion or rhinorrhea.      Right Nostril: No foreign body or epistaxis.      Left Nostril: No foreign body or epistaxis.      Right Turbinates: Not swollen or pale.      Left Turbinates: Not swollen or pale.  Right Sinus: Frontal sinus tenderness present. No maxillary sinus tenderness.      Left Sinus: Frontal sinus tenderness present. No maxillary sinus tenderness.      Comments: Patient reports mild pain on both frontal sinuses.     Mouth/Throat:      Lips: Pink.      Mouth: Mucous membranes are moist. No oral lesions or angioedema.      Dentition: No dental abscesses.      Pharynx: Oropharynx is clear. Uvula midline. No pharyngeal swelling, oropharyngeal exudate, posterior oropharyngeal erythema, uvula swelling or postnasal drip.      Tonsils: No tonsillar exudate or tonsillar abscesses.   Eyes:      General: Lids are normal.         Right eye: No discharge.         Left eye: No discharge.      Conjunctiva/sclera: Conjunctivae normal.      Right eye: Right conjunctiva is not injected. No exudate.     Left eye: Left conjunctiva is not  injected. No exudate.  Cardiovascular:      Rate and Rhythm: Normal rate and regular rhythm.      Pulses:           Radial pulses are 3+ on the right side and 3+ on the left side.      Heart sounds: Normal heart sounds, S1 normal and S2 normal.   Pulmonary:      Effort: Pulmonary effort is normal. No tachypnea, bradypnea, accessory muscle usage, respiratory distress or retractions.      Breath sounds: Normal breath sounds. No stridor. No decreased breath sounds, wheezing, rhonchi or rales.   Abdominal:      General: Bowel sounds are normal. There is no distension.      Palpations: Abdomen is soft.      Tenderness: There is no abdominal tenderness. There is no guarding or rebound.   Musculoskeletal:      Cervical back: Neck supple. No edema, erythema, rigidity, tenderness or crepitus. No pain with movement, spinous process tenderness or muscular tenderness. Normal range of motion.   Lymphadenopathy:      Head:      Right side of head: No submental, submandibular, tonsillar, preauricular, posterior auricular or occipital adenopathy.      Left side of head: No submental, submandibular, tonsillar, preauricular, posterior auricular or occipital adenopathy.      Cervical: No cervical adenopathy.      Right cervical: No superficial, deep or posterior cervical adenopathy.     Left cervical: No superficial, deep or posterior cervical adenopathy.   Skin:     General: Skin is warm and dry.      Capillary Refill: Capillary refill takes less than 2 seconds.      Coloration: Skin is not ashen, cyanotic, jaundiced, mottled, pale or sallow.   Neurological:      Mental Status: She is alert and oriented to person, place, and time.      Gait: Gait normal.   Psychiatric:         Attention and Perception: Attention normal.         Mood and Affect: Mood and affect normal.         Speech: Speech normal.         Behavior: Behavior normal. Behavior is cooperative.         Judgment: Judgment is not impulsive or inappropriate.        Vitals:     01/08/24  [id number] 01/08/24 0841   BP: (!) 161/100 (!) 156/91   BP Site: Left Upper Arm    Patient Position: Sitting    BP Cuff Size: Medium Adult    Pulse: 88    Resp: 19    Temp: 98.4 F (36.9 C)    TempSrc: Temporal    SpO2: 94%    Weight: 85.3 kg (188 lb)      Current Outpatient Medications   Medication Sig Dispense Refill    Abatacept (ORENCIA) 125 MG/ML SOSY Inject 125 mg into the skin once a week      amoxicillin -clavulanate (AUGMENTIN ) 875-125 MG per tablet Take 1 tablet by mouth 2 times daily      benzonatate (TESSALON) 200 MG capsule Take 1 capsule by mouth 3 times daily as needed      chlorhexidine (PERIDEX) 0.12 % solution       Estradiol (VAGIFEM) 10 MCG TABS vaginal tablet Place 1 tablet vaginally Twice a Week      famotidine (PEPCID) 40 MG tablet Take 1 tablet by mouth 2 times daily      furosemide (LASIX) 20 MG tablet Take 1 tablet by mouth daily      rosuvastatin (CRESTOR) 20 MG tablet Take 1 tablet by mouth daily      zolpidem (AMBIEN) 5 MG tablet Take 0.5 tablets by mouth nightly as needed.      ascorbic acid (VITAMIN C) 500 MG tablet Take 1 tablet by mouth daily      budesonide (PULMICORT) 180 MCG/ACT AEPB inhaler Inhale 1 puff into the lungs daily as needed      Calcium Carbonate-Vitamin D (OYSTER SHELL CALCIUM/D) 500-5 MG-MCG TABS Take 1 tablet by mouth 2 times daily      Cholecalciferol 50 MCG (2000 UT) TABS Take 50 mcg by mouth daily      Docusate Sodium (DSS) 250 MG CAPS Take 250 mg by mouth daily      ibuprofen (ADVIL;MOTRIN) 200 MG tablet Take 1-2 tablets by mouth every 8 hours as needed      MAGNESIUM MALATE PO Take 1,250 mg by mouth daily      Multiple Vitamin (MULTI-VITAMINS) TABS Take 1 tablet by mouth daily      Omega-3 Fatty Acids (FISH OIL) 1000 MG capsule Take 1 capsule by mouth daily      Omeprazole-Sodium Bicarbonate 20-1100 MG CAPS Take 20-1,100 mg by mouth daily as needed       No current facility-administered medications for this visit.        PAST MEDICAL HISTORY:   Medical:  Pt  has no past medical history on file.  Surgical: Pt  has no past surgical history on file.  Family: Pt family history is not on file.  Social: Pt   Social History     Socioeconomic History    Marital status: Married     Spouse name: Not on file    Number of children: Not on file    Years of education: Not on file    Highest education level: Not on file   Occupational History    Not on file   Tobacco Use    Smoking status: Never    Smokeless tobacco: Never   Substance and Sexual Activity    Alcohol use: Not on file    Drug use: Not Currently    Sexual activity: Not on file   Other Topics Concern    Not on file   Social History Narrative  Not on file     Social Drivers of Health     Financial Resource Strain: Low Risk (03/25/2021)    Received from [facility]    Overall Financial Resource Strain (CARDIA)     Difficulty of Paying Living Expenses: Not hard at all   Food Insecurity: No Food Insecurity (12/23/2023)    Received from [facility]    Hunger Vital Sign     Within the past 12 months, you worried that your food would run out before you got the money to buy more.: Never true     Within the past 12 months, the food you bought just didn't last and you didn't have money to get more.: Never true   Transportation Needs: No Transportation Needs (10/18/2021)    Received from [facility] - [facility]     Lack of Transportation (Medical): No     Lack of Transportation (Non-Medical): No   Physical Activity: Insufficiently Active (03/25/2021)    Received from [facility]    Exercise Vital Sign     On average, how many days per week do you engage in moderate to strenuous exercise (like a brisk walk)?: 4 days     On average, how many minutes do you engage in exercise at this level?: 30 min   Stress: No Stress Concern Present (03/25/2021)    Received from [facility] - Occupational Stress Questionnaire     Feeling of Stress : Not at all   Social Connections: Socially  Integrated (03/25/2021)    Received from [facility]    Social Connection and Isolation Panel     In a typical week, how many times do you talk on the phone with family, friends, or neighbors?: More than three times a week     How often do you get together with friends or relatives?: More than three times a week     How often do you attend church or religious services?: More than 4 times per year     Do you belong to any clubs or organizations such as church groups, unions, fraternal or athletic groups, or school groups?: Yes     How often do you attend meetings of the clubs or organizations you belong to?: More than 4 times per year     Are you married, widowed, divorced, separated, never married, or living with a partner?: Married   Intimate Partner Violence: Not At Risk (03/25/2021)    Received from [facility]    Humiliation, Afraid, Rape, and Kick questionnaire     Within the last year, have you been afraid of your partner or ex-partner?: No     Within the last year, have you been humiliated or emotionally abused in other ways by your partner or ex-partner?: No     Within the last year, have you been kicked, hit, slapped, or otherwise physically hurt by your partner or ex-partner?: No     Within the last year, have you been raped or forced to have any kind of sexual activity by your partner or ex-partner?: No   Housing Stability: Low Risk (10/18/2021)    Received from [facility] Stability Vital Sign     Unable to Pay for Housing in the Last Year: No     Number of Places Lived in the Last Year: 1     Unstable Housing in the Last Year: No  Allergies: Patient  Allergies   Allergen Reactions    Sulfa Antibiotics Rash    Adalimumab      REACTION: frequent sinus infection    Alendronate Sodium      Acid reflux    Azathioprine Other (See Comments)     Pt states that Imuran drops her platelet count    Diclofenac      unk    Dicyclomine      Turn teeth gray    Etanercept      SINUS INFECTIONS     Methotrexate And Trimetrexate Dizziness or Vertigo and Nausea Only     Only Oral Methotrexate    Oral metotrexate    Pilocarpine Headaches    Pilocarpine Hcl Other (See Comments)     Excessive sweating, headaches    Piroxicam      unk    Sulfamethoxazole-Trimethoprim      REACTION: face and hands rash and felt burning sensation    Tramadol Nausea Only    Hydroxychloroquine Rash    Nickel Rash     Causes Rashes on Face       Patient Care Team:  Winfred Small, MD as PCP - General (Geriatric Medicine)      DIAGNOSTICS:     No results found for any visits on 01/08/24.     The results of the diagnostic studies have been independently interpreted by myself: All studies will be over-read by Radiologist and patient will be called if any changes to treatment or diagnosis based on final read.       I ADVISED PATIENT TO GO TO EMERGENCY DEPARTMENT IF SYMPTOMS WORSEN , CHANGE OR FAILS TO IMPROVE.    I have discussed the diagnosis with the patient and the intended plan as seen in the above orders.  The patient also understands that early in the process of an illness, the urgent care workup can be falsely reassuring. Routine discharge counseling and specific return precautions dicussed with the patient and the patient understand that worsening, changing or persistent symptoms should prompt an immediate return to an urgent care or emergency department. Patient/Guardian expressed understanding and agrees with the discharge instructions. No further questions at this time of discharge. The patient has received an after-visit summary and questions were answered concerning future plans. I have discussed medication side effects and warnings with the patient as well. The patient agrees and understands above plan.       (Please note that parts of this dictation were completed with voice recognition software. Quite often unanticipated grammatical, syntax, homophones, and other interpretive errors are inadvertently transcribed by the  computer software. Efforts were made to edit the dictation but occasionally words remain mis-transcribed.)     An electronic signature was used to authenticate this note.  -- Trellis Fischer, APRN - NP     "

## 2024-06-30 ENCOUNTER — Encounter: Admitting: Obstetrics and Gynecology
